# Patient Record
Sex: Female | Born: 1950 | Race: Black or African American | Hispanic: No | Smoking: Never smoker
Health system: Southern US, Community
[De-identification: ages and names within clinical notes are randomized; demographics above are authoritative.]

## PROBLEM LIST (undated history)

## (undated) DIAGNOSIS — Z95810 Presence of automatic (implantable) cardiac defibrillator: Secondary | ICD-10-CM

## (undated) DIAGNOSIS — D259 Leiomyoma of uterus, unspecified: Secondary | ICD-10-CM

## (undated) DIAGNOSIS — B009 Herpesviral infection, unspecified: Secondary | ICD-10-CM

## (undated) DIAGNOSIS — C801 Malignant (primary) neoplasm, unspecified: Secondary | ICD-10-CM

## (undated) DIAGNOSIS — Z7901 Long term (current) use of anticoagulants: Secondary | ICD-10-CM

## (undated) DIAGNOSIS — C50919 Malignant neoplasm of unspecified site of unspecified female breast: Secondary | ICD-10-CM

## (undated) DIAGNOSIS — D219 Benign neoplasm of connective and other soft tissue, unspecified: Secondary | ICD-10-CM

## (undated) DIAGNOSIS — I5032 Chronic diastolic (congestive) heart failure: Secondary | ICD-10-CM

## (undated) DIAGNOSIS — I428 Other cardiomyopathies: Secondary | ICD-10-CM

## (undated) DIAGNOSIS — I639 Cerebral infarction, unspecified: Secondary | ICD-10-CM

## (undated) DIAGNOSIS — Z1379 Encounter for other screening for genetic and chromosomal anomalies: Secondary | ICD-10-CM

## (undated) DIAGNOSIS — N870 Mild cervical dysplasia: Secondary | ICD-10-CM

## (undated) DIAGNOSIS — I4891 Unspecified atrial fibrillation: Secondary | ICD-10-CM

## (undated) DIAGNOSIS — I10 Essential (primary) hypertension: Secondary | ICD-10-CM

## (undated) DIAGNOSIS — I509 Heart failure, unspecified: Secondary | ICD-10-CM

## (undated) HISTORY — PX: CERVICAL CONE BIOPSY: SUR198

## (undated) HISTORY — PX: OOPHORECTOMY: SHX86

## (undated) HISTORY — DX: Chronic diastolic (congestive) heart failure: I50.32

## (undated) HISTORY — DX: Benign neoplasm of connective and other soft tissue, unspecified: D21.9

## (undated) HISTORY — PX: BREAST SURGERY: SHX581

## (undated) HISTORY — DX: Presence of automatic (implantable) cardiac defibrillator: Z95.810

## (undated) HISTORY — DX: Long term (current) use of anticoagulants: Z79.01

## (undated) HISTORY — DX: Malignant neoplasm of unspecified site of unspecified female breast: C50.919

## (undated) HISTORY — PX: COLPOSCOPY: SHX161

## (undated) HISTORY — DX: Herpesviral infection, unspecified: B00.9

## (undated) HISTORY — PX: TUBAL LIGATION: SHX77

## (undated) HISTORY — DX: Leiomyoma of uterus, unspecified: D25.9

## (undated) HISTORY — DX: Encounter for other screening for genetic and chromosomal anomalies: Z13.79

## (undated) HISTORY — DX: Essential (primary) hypertension: I10

## (undated) HISTORY — DX: Mild cervical dysplasia: N87.0

## (undated) HISTORY — DX: Malignant (primary) neoplasm, unspecified: C80.1

## (undated) HISTORY — DX: Cerebral infarction, unspecified: I63.9

## (undated) HISTORY — DX: Unspecified atrial fibrillation: I48.91

## (undated) HISTORY — DX: Heart failure, unspecified: I50.9

## (undated) HISTORY — PX: GYNECOLOGIC CRYOSURGERY: SHX857

## (undated) HISTORY — PX: MASTECTOMY: SHX3

## (undated) HISTORY — PX: CARDIAC DEFIBRILLATOR PLACEMENT: SHX171

## (undated) HISTORY — DX: Other cardiomyopathies: I42.8

---

## 1998-01-29 ENCOUNTER — Encounter: Admission: RE | Admit: 1998-01-29 | Discharge: 1998-04-29 | Payer: Self-pay | Admitting: Radiation Oncology

## 1998-08-11 ENCOUNTER — Observation Stay (HOSPITAL_COMMUNITY): Admission: RE | Admit: 1998-08-11 | Discharge: 1998-08-12 | Payer: Self-pay | Admitting: Obstetrics and Gynecology

## 1998-11-04 ENCOUNTER — Inpatient Hospital Stay (HOSPITAL_COMMUNITY): Admission: RE | Admit: 1998-11-04 | Discharge: 1998-11-08 | Payer: Self-pay | Admitting: Surgery

## 1998-11-04 ENCOUNTER — Encounter: Payer: Self-pay | Admitting: Surgery

## 1999-09-01 ENCOUNTER — Other Ambulatory Visit: Admission: RE | Admit: 1999-09-01 | Discharge: 1999-09-01 | Payer: Self-pay | Admitting: Obstetrics and Gynecology

## 1999-12-22 ENCOUNTER — Ambulatory Visit (HOSPITAL_BASED_OUTPATIENT_CLINIC_OR_DEPARTMENT_OTHER): Admission: RE | Admit: 1999-12-22 | Discharge: 1999-12-22 | Payer: Self-pay | Admitting: Plastic Surgery

## 2000-02-09 ENCOUNTER — Ambulatory Visit (HOSPITAL_BASED_OUTPATIENT_CLINIC_OR_DEPARTMENT_OTHER): Admission: RE | Admit: 2000-02-09 | Discharge: 2000-02-09 | Payer: Self-pay | Admitting: Plastic Surgery

## 2000-03-08 ENCOUNTER — Encounter: Admission: RE | Admit: 2000-03-08 | Discharge: 2000-03-08 | Payer: Self-pay | Admitting: Oncology

## 2000-03-08 ENCOUNTER — Encounter: Payer: Self-pay | Admitting: Oncology

## 2000-09-04 ENCOUNTER — Encounter: Admission: RE | Admit: 2000-09-04 | Discharge: 2000-09-04 | Payer: Self-pay | Admitting: Oncology

## 2000-09-04 ENCOUNTER — Encounter: Payer: Self-pay | Admitting: Oncology

## 2000-10-24 ENCOUNTER — Other Ambulatory Visit: Admission: RE | Admit: 2000-10-24 | Discharge: 2000-10-24 | Payer: Self-pay | Admitting: Obstetrics and Gynecology

## 2001-02-28 ENCOUNTER — Encounter: Admission: RE | Admit: 2001-02-28 | Discharge: 2001-02-28 | Payer: Self-pay | Admitting: Oncology

## 2001-02-28 ENCOUNTER — Encounter: Payer: Self-pay | Admitting: Oncology

## 2001-09-05 ENCOUNTER — Encounter: Admission: RE | Admit: 2001-09-05 | Discharge: 2001-09-05 | Payer: Self-pay | Admitting: Oncology

## 2001-09-05 ENCOUNTER — Encounter: Payer: Self-pay | Admitting: Oncology

## 2001-12-10 ENCOUNTER — Other Ambulatory Visit: Admission: RE | Admit: 2001-12-10 | Discharge: 2001-12-10 | Payer: Self-pay | Admitting: Obstetrics and Gynecology

## 2001-12-27 ENCOUNTER — Encounter: Payer: Self-pay | Admitting: Oncology

## 2001-12-27 ENCOUNTER — Encounter: Admission: RE | Admit: 2001-12-27 | Discharge: 2001-12-27 | Payer: Self-pay | Admitting: Oncology

## 2002-03-06 ENCOUNTER — Encounter: Admission: RE | Admit: 2002-03-06 | Discharge: 2002-03-06 | Payer: Self-pay | Admitting: Oncology

## 2002-03-06 ENCOUNTER — Encounter: Payer: Self-pay | Admitting: Oncology

## 2002-12-10 ENCOUNTER — Other Ambulatory Visit: Admission: RE | Admit: 2002-12-10 | Discharge: 2002-12-10 | Payer: Self-pay | Admitting: Obstetrics and Gynecology

## 2003-03-04 ENCOUNTER — Encounter: Admission: RE | Admit: 2003-03-04 | Discharge: 2003-03-04 | Payer: Self-pay | Admitting: Oncology

## 2003-03-04 ENCOUNTER — Encounter: Payer: Self-pay | Admitting: Oncology

## 2003-07-10 ENCOUNTER — Ambulatory Visit (HOSPITAL_COMMUNITY): Admission: RE | Admit: 2003-07-10 | Discharge: 2003-07-10 | Payer: Self-pay | Admitting: Gastroenterology

## 2004-02-03 ENCOUNTER — Other Ambulatory Visit: Admission: RE | Admit: 2004-02-03 | Discharge: 2004-02-03 | Payer: Self-pay | Admitting: Obstetrics and Gynecology

## 2004-04-13 ENCOUNTER — Ambulatory Visit (HOSPITAL_COMMUNITY): Admission: RE | Admit: 2004-04-13 | Discharge: 2004-04-13 | Payer: Self-pay | Admitting: Oncology

## 2004-04-27 ENCOUNTER — Other Ambulatory Visit: Admission: RE | Admit: 2004-04-27 | Discharge: 2004-04-27 | Payer: Self-pay | Admitting: Obstetrics and Gynecology

## 2005-02-15 ENCOUNTER — Other Ambulatory Visit: Admission: RE | Admit: 2005-02-15 | Discharge: 2005-02-15 | Payer: Self-pay | Admitting: Obstetrics and Gynecology

## 2005-04-12 ENCOUNTER — Ambulatory Visit (HOSPITAL_COMMUNITY): Admission: RE | Admit: 2005-04-12 | Discharge: 2005-04-12 | Payer: Self-pay | Admitting: Oncology

## 2005-04-17 ENCOUNTER — Ambulatory Visit: Payer: Self-pay | Admitting: Oncology

## 2006-02-16 ENCOUNTER — Other Ambulatory Visit: Admission: RE | Admit: 2006-02-16 | Discharge: 2006-02-16 | Payer: Self-pay | Admitting: Obstetrics and Gynecology

## 2006-04-04 ENCOUNTER — Ambulatory Visit: Payer: Self-pay | Admitting: Oncology

## 2006-04-09 LAB — COMPREHENSIVE METABOLIC PANEL
AST: 19 U/L (ref 0–37)
Alkaline Phosphatase: 48 U/L (ref 39–117)
BUN: 16 mg/dL (ref 6–23)
Creatinine, Ser: 0.94 mg/dL (ref 0.40–1.20)

## 2006-04-09 LAB — CBC WITH DIFFERENTIAL/PLATELET
Basophils Absolute: 0 10*3/uL (ref 0.0–0.1)
EOS%: 3.4 % (ref 0.0–7.0)
HCT: 38.4 % (ref 34.8–46.6)
HGB: 12.9 g/dL (ref 11.6–15.9)
MCH: 27 pg (ref 26.0–34.0)
MCV: 80 fL — ABNORMAL LOW (ref 81.0–101.0)
MONO%: 10.8 % (ref 0.0–13.0)
NEUT%: 35.5 % — ABNORMAL LOW (ref 39.6–76.8)

## 2006-04-10 ENCOUNTER — Ambulatory Visit (HOSPITAL_COMMUNITY): Admission: RE | Admit: 2006-04-10 | Discharge: 2006-04-10 | Payer: Self-pay | Admitting: Oncology

## 2006-07-29 ENCOUNTER — Emergency Department (HOSPITAL_COMMUNITY): Admission: EM | Admit: 2006-07-29 | Discharge: 2006-07-29 | Payer: Self-pay | Admitting: *Deleted

## 2006-08-14 ENCOUNTER — Encounter (INDEPENDENT_AMBULATORY_CARE_PROVIDER_SITE_OTHER): Payer: Self-pay | Admitting: Cardiology

## 2006-08-14 ENCOUNTER — Encounter (INDEPENDENT_AMBULATORY_CARE_PROVIDER_SITE_OTHER): Payer: Self-pay | Admitting: Neurology

## 2006-08-14 ENCOUNTER — Inpatient Hospital Stay (HOSPITAL_COMMUNITY): Admission: EM | Admit: 2006-08-14 | Discharge: 2006-08-16 | Payer: Self-pay | Admitting: Emergency Medicine

## 2006-08-14 HISTORY — PX: TRANSTHORACIC ECHOCARDIOGRAM: SHX275

## 2006-08-16 ENCOUNTER — Ambulatory Visit: Payer: Self-pay | Admitting: Physical Medicine & Rehabilitation

## 2006-08-16 ENCOUNTER — Inpatient Hospital Stay (HOSPITAL_COMMUNITY)
Admission: RE | Admit: 2006-08-16 | Discharge: 2006-08-23 | Payer: Self-pay | Admitting: Physical Medicine & Rehabilitation

## 2006-08-16 ENCOUNTER — Ambulatory Visit: Payer: Self-pay | Admitting: Oncology

## 2006-08-16 ENCOUNTER — Ambulatory Visit: Payer: Self-pay | Admitting: Cardiovascular Disease

## 2006-08-19 ENCOUNTER — Ambulatory Visit
Admission: RE | Admit: 2006-08-19 | Discharge: 2006-08-19 | Payer: Self-pay | Admitting: Physical Medicine & Rehabilitation

## 2006-08-28 ENCOUNTER — Encounter
Admission: RE | Admit: 2006-08-28 | Discharge: 2006-11-14 | Payer: Self-pay | Admitting: Physical Medicine & Rehabilitation

## 2006-09-14 ENCOUNTER — Encounter
Admission: RE | Admit: 2006-09-14 | Discharge: 2006-12-13 | Payer: Self-pay | Admitting: Physical Medicine & Rehabilitation

## 2006-09-14 ENCOUNTER — Ambulatory Visit: Payer: Self-pay | Admitting: Physical Medicine & Rehabilitation

## 2006-10-15 ENCOUNTER — Ambulatory Visit: Payer: Self-pay | Admitting: Physical Medicine & Rehabilitation

## 2006-12-24 ENCOUNTER — Ambulatory Visit: Payer: Self-pay | Admitting: Internal Medicine

## 2006-12-24 LAB — CONVERTED CEMR LAB
Basophils Relative: 0.9 % (ref 0.0–1.0)
GFR calc Af Amer: 96 mL/min
GFR calc non Af Amer: 80 mL/min
INR: 2.5 — ABNORMAL HIGH (ref 0.9–2.0)
MCHC: 33.5 g/dL (ref 30.0–36.0)
MCV: 81.5 fL (ref 78.0–100.0)
Monocytes Absolute: 0.5 10*3/uL (ref 0.2–0.7)
Monocytes Relative: 10.2 % (ref 3.0–11.0)
Neutro Abs: 1.9 10*3/uL (ref 1.4–7.7)
Neutrophils Relative %: 43.1 % (ref 43.0–77.0)
Prothrombin Time: 20.3 s — ABNORMAL HIGH (ref 10.0–14.0)
Sodium: 140 meq/L (ref 135–145)
WBC: 4.5 10*3/uL (ref 4.5–10.5)

## 2007-01-04 ENCOUNTER — Observation Stay (HOSPITAL_COMMUNITY): Admission: RE | Admit: 2007-01-04 | Discharge: 2007-01-05 | Payer: Self-pay | Admitting: Internal Medicine

## 2007-01-04 ENCOUNTER — Ambulatory Visit: Payer: Self-pay | Admitting: Internal Medicine

## 2007-01-09 ENCOUNTER — Ambulatory Visit: Payer: Self-pay | Admitting: Oncology

## 2007-01-14 LAB — CBC WITH DIFFERENTIAL/PLATELET
Basophils Absolute: 0 10*3/uL (ref 0.0–0.1)
Eosinophils Absolute: 0.1 10*3/uL (ref 0.0–0.5)
HGB: 12.4 g/dL (ref 11.6–15.9)
MCV: 79.7 fL — ABNORMAL LOW (ref 81.0–101.0)
MONO%: 8.7 % (ref 0.0–13.0)
NEUT#: 6.6 10*3/uL — ABNORMAL HIGH (ref 1.5–6.5)
Platelets: 287 10*3/uL (ref 145–400)
RDW: 15.6 % — ABNORMAL HIGH (ref 11.3–14.5)

## 2007-01-14 LAB — COMPREHENSIVE METABOLIC PANEL
Albumin: 4.1 g/dL (ref 3.5–5.2)
Alkaline Phosphatase: 94 U/L (ref 39–117)
BUN: 18 mg/dL (ref 6–23)
CO2: 23 mEq/L (ref 19–32)
Calcium: 9.8 mg/dL (ref 8.4–10.5)
Glucose, Bld: 103 mg/dL — ABNORMAL HIGH (ref 70–99)
Potassium: 4.4 mEq/L (ref 3.5–5.3)

## 2007-01-14 LAB — CANCER ANTIGEN 27.29: CA 27.29: 16 U/mL (ref 0–39)

## 2007-01-30 ENCOUNTER — Ambulatory Visit: Payer: Self-pay

## 2007-03-13 ENCOUNTER — Other Ambulatory Visit: Admission: RE | Admit: 2007-03-13 | Discharge: 2007-03-13 | Payer: Self-pay | Admitting: Family Medicine

## 2007-04-08 ENCOUNTER — Ambulatory Visit: Payer: Self-pay | Admitting: Internal Medicine

## 2007-04-15 ENCOUNTER — Ambulatory Visit: Payer: Self-pay | Admitting: Internal Medicine

## 2007-06-06 ENCOUNTER — Ambulatory Visit: Payer: Self-pay | Admitting: Internal Medicine

## 2007-06-07 ENCOUNTER — Ambulatory Visit (HOSPITAL_COMMUNITY): Admission: RE | Admit: 2007-06-07 | Discharge: 2007-06-07 | Payer: Self-pay | Admitting: Oncology

## 2007-07-04 ENCOUNTER — Ambulatory Visit: Payer: Self-pay

## 2007-08-09 ENCOUNTER — Observation Stay (HOSPITAL_COMMUNITY): Admission: EM | Admit: 2007-08-09 | Discharge: 2007-08-10 | Payer: Self-pay | Admitting: Emergency Medicine

## 2007-10-01 ENCOUNTER — Ambulatory Visit: Payer: Self-pay | Admitting: Internal Medicine

## 2008-01-02 ENCOUNTER — Ambulatory Visit: Payer: Self-pay | Admitting: Oncology

## 2008-01-06 ENCOUNTER — Ambulatory Visit: Payer: Self-pay | Admitting: Internal Medicine

## 2008-01-06 LAB — CBC WITH DIFFERENTIAL/PLATELET
BASO%: 0.8 % (ref 0.0–2.0)
EOS%: 2.6 % (ref 0.0–7.0)
HCT: 38.1 % (ref 34.8–46.6)
MCH: 26.6 pg (ref 26.0–34.0)
MCHC: 34.5 g/dL (ref 32.0–36.0)
NEUT%: 33.4 % — ABNORMAL LOW (ref 39.6–76.8)
RBC: 4.94 10*6/uL (ref 3.70–5.32)
lymph#: 2.4 10*3/uL (ref 0.9–3.3)

## 2008-01-06 LAB — COMPREHENSIVE METABOLIC PANEL
ALT: 12 U/L (ref 0–35)
AST: 21 U/L (ref 0–37)
Calcium: 9.6 mg/dL (ref 8.4–10.5)
Chloride: 102 mEq/L (ref 96–112)
Creatinine, Ser: 0.91 mg/dL (ref 0.40–1.20)
Total Bilirubin: 0.6 mg/dL (ref 0.3–1.2)

## 2008-01-15 ENCOUNTER — Ambulatory Visit (HOSPITAL_COMMUNITY): Admission: RE | Admit: 2008-01-15 | Discharge: 2008-01-15 | Payer: Self-pay | Admitting: Oncology

## 2008-04-03 ENCOUNTER — Other Ambulatory Visit: Admission: RE | Admit: 2008-04-03 | Discharge: 2008-04-03 | Payer: Self-pay | Admitting: Family Medicine

## 2008-04-06 ENCOUNTER — Ambulatory Visit: Payer: Self-pay | Admitting: Internal Medicine

## 2008-04-30 ENCOUNTER — Other Ambulatory Visit: Admission: RE | Admit: 2008-04-30 | Discharge: 2008-04-30 | Payer: Self-pay | Admitting: Obstetrics and Gynecology

## 2008-06-23 ENCOUNTER — Other Ambulatory Visit: Admission: RE | Admit: 2008-06-23 | Discharge: 2008-06-23 | Payer: Self-pay | Admitting: Obstetrics and Gynecology

## 2008-07-06 ENCOUNTER — Ambulatory Visit: Payer: Self-pay | Admitting: Internal Medicine

## 2008-08-03 ENCOUNTER — Ambulatory Visit: Payer: Self-pay | Admitting: Internal Medicine

## 2008-08-08 ENCOUNTER — Emergency Department (HOSPITAL_COMMUNITY): Admission: EM | Admit: 2008-08-08 | Discharge: 2008-08-08 | Payer: Self-pay | Admitting: Emergency Medicine

## 2008-08-25 ENCOUNTER — Ambulatory Visit: Payer: Self-pay | Admitting: Internal Medicine

## 2008-12-23 ENCOUNTER — Encounter: Payer: Self-pay | Admitting: Internal Medicine

## 2009-01-01 ENCOUNTER — Ambulatory Visit: Payer: Self-pay | Admitting: Oncology

## 2009-01-05 LAB — COMPREHENSIVE METABOLIC PANEL
ALT: 14 U/L (ref 0–35)
Albumin: 4.8 g/dL (ref 3.5–5.2)
CO2: 24 mEq/L (ref 19–32)
Calcium: 9.8 mg/dL (ref 8.4–10.5)
Chloride: 100 mEq/L (ref 96–112)
Potassium: 3.8 mEq/L (ref 3.5–5.3)
Sodium: 139 mEq/L (ref 135–145)
Total Bilirubin: 0.6 mg/dL (ref 0.3–1.2)
Total Protein: 7.5 g/dL (ref 6.0–8.3)

## 2009-01-05 LAB — CBC WITH DIFFERENTIAL/PLATELET
BASO%: 0.4 % (ref 0.0–2.0)
Eosinophils Absolute: 0.2 10*3/uL (ref 0.0–0.5)
MCHC: 33.9 g/dL (ref 31.5–36.0)
MONO#: 0.5 10*3/uL (ref 0.1–0.9)
NEUT#: 3.1 10*3/uL (ref 1.5–6.5)
RBC: 5.3 10*6/uL (ref 3.70–5.45)
WBC: 6.4 10*3/uL (ref 3.9–10.3)
lymph#: 2.5 10*3/uL (ref 0.9–3.3)

## 2009-01-05 LAB — CANCER ANTIGEN 27.29: CA 27.29: 22 U/mL (ref 0–39)

## 2009-03-19 ENCOUNTER — Encounter: Payer: Self-pay | Admitting: Internal Medicine

## 2009-03-24 ENCOUNTER — Ambulatory Visit: Payer: Self-pay | Admitting: Internal Medicine

## 2009-04-13 ENCOUNTER — Other Ambulatory Visit: Admission: RE | Admit: 2009-04-13 | Discharge: 2009-04-13 | Payer: Self-pay | Admitting: Family Medicine

## 2009-04-16 ENCOUNTER — Encounter: Payer: Self-pay | Admitting: Internal Medicine

## 2009-05-21 ENCOUNTER — Other Ambulatory Visit: Admission: RE | Admit: 2009-05-21 | Discharge: 2009-05-21 | Payer: Self-pay | Admitting: Family Medicine

## 2009-06-09 ENCOUNTER — Ambulatory Visit: Payer: Self-pay | Admitting: Obstetrics and Gynecology

## 2009-06-28 ENCOUNTER — Ambulatory Visit: Payer: Self-pay | Admitting: Internal Medicine

## 2009-06-29 ENCOUNTER — Encounter: Payer: Self-pay | Admitting: Internal Medicine

## 2009-07-07 ENCOUNTER — Encounter: Payer: Self-pay | Admitting: Internal Medicine

## 2009-07-12 ENCOUNTER — Ambulatory Visit (HOSPITAL_COMMUNITY): Admission: RE | Admit: 2009-07-12 | Discharge: 2009-07-12 | Payer: Self-pay | Admitting: Obstetrics and Gynecology

## 2009-07-12 ENCOUNTER — Ambulatory Visit: Payer: Self-pay | Admitting: Obstetrics and Gynecology

## 2009-07-12 ENCOUNTER — Encounter: Payer: Self-pay | Admitting: Obstetrics and Gynecology

## 2009-08-09 ENCOUNTER — Ambulatory Visit: Payer: Self-pay | Admitting: Obstetrics and Gynecology

## 2009-08-18 DIAGNOSIS — I509 Heart failure, unspecified: Secondary | ICD-10-CM

## 2009-08-18 DIAGNOSIS — I4891 Unspecified atrial fibrillation: Secondary | ICD-10-CM | POA: Insufficient documentation

## 2009-08-18 DIAGNOSIS — I10 Essential (primary) hypertension: Secondary | ICD-10-CM

## 2009-08-18 HISTORY — DX: Essential (primary) hypertension: I10

## 2009-08-18 HISTORY — DX: Heart failure, unspecified: I50.9

## 2009-08-25 ENCOUNTER — Encounter: Payer: Self-pay | Admitting: Internal Medicine

## 2009-08-25 ENCOUNTER — Ambulatory Visit: Payer: Self-pay | Admitting: Internal Medicine

## 2009-08-25 DIAGNOSIS — Z95810 Presence of automatic (implantable) cardiac defibrillator: Secondary | ICD-10-CM

## 2009-08-25 HISTORY — DX: Presence of automatic (implantable) cardiac defibrillator: Z95.810

## 2009-11-04 ENCOUNTER — Ambulatory Visit (HOSPITAL_COMMUNITY): Admission: RE | Admit: 2009-11-04 | Discharge: 2009-11-04 | Payer: Self-pay | Admitting: Oncology

## 2009-12-15 ENCOUNTER — Ambulatory Visit: Payer: Self-pay | Admitting: Internal Medicine

## 2009-12-24 ENCOUNTER — Encounter: Payer: Self-pay | Admitting: Internal Medicine

## 2009-12-31 ENCOUNTER — Ambulatory Visit: Payer: Self-pay | Admitting: Oncology

## 2010-01-04 LAB — CBC WITH DIFFERENTIAL/PLATELET
BASO%: 0.4 % (ref 0.0–2.0)
Basophils Absolute: 0 10*3/uL (ref 0.0–0.1)
EOS%: 5.5 % (ref 0.0–7.0)
Eosinophils Absolute: 0.3 10*3/uL (ref 0.0–0.5)
HCT: 38.6 % (ref 34.8–46.6)
HGB: 13.2 g/dL (ref 11.6–15.9)
MCH: 26.5 pg (ref 25.1–34.0)
MONO#: 0.6 10*3/uL (ref 0.1–0.9)
NEUT%: 38.6 % (ref 38.4–76.8)
Platelets: 198 10*3/uL (ref 145–400)
RBC: 4.99 10*6/uL (ref 3.70–5.45)
RDW: 15 % — ABNORMAL HIGH (ref 11.2–14.5)
WBC: 4.9 10*3/uL (ref 3.9–10.3)

## 2010-01-04 LAB — COMPREHENSIVE METABOLIC PANEL
Albumin: 4.4 g/dL (ref 3.5–5.2)
Alkaline Phosphatase: 57 U/L (ref 39–117)
Potassium: 4.1 mEq/L (ref 3.5–5.3)
Total Bilirubin: 0.5 mg/dL (ref 0.3–1.2)

## 2010-01-04 LAB — CANCER ANTIGEN 27.29: CA 27.29: 20 U/mL (ref 0–39)

## 2010-01-27 ENCOUNTER — Other Ambulatory Visit: Admission: RE | Admit: 2010-01-27 | Discharge: 2010-01-27 | Payer: Self-pay | Admitting: Obstetrics and Gynecology

## 2010-01-27 ENCOUNTER — Ambulatory Visit: Payer: Self-pay | Admitting: Obstetrics and Gynecology

## 2010-03-21 ENCOUNTER — Ambulatory Visit: Payer: Self-pay | Admitting: Internal Medicine

## 2010-04-02 ENCOUNTER — Emergency Department (HOSPITAL_COMMUNITY)
Admission: EM | Admit: 2010-04-02 | Discharge: 2010-04-02 | Payer: Self-pay | Source: Home / Self Care | Admitting: Emergency Medicine

## 2010-04-04 ENCOUNTER — Encounter: Payer: Self-pay | Admitting: Internal Medicine

## 2010-06-24 ENCOUNTER — Encounter: Payer: Self-pay | Admitting: Internal Medicine

## 2010-06-27 ENCOUNTER — Ambulatory Visit: Payer: Self-pay | Admitting: Internal Medicine

## 2010-07-18 ENCOUNTER — Encounter: Payer: Self-pay | Admitting: Internal Medicine

## 2010-08-17 ENCOUNTER — Other Ambulatory Visit: Admission: RE | Admit: 2010-08-17 | Discharge: 2010-08-17 | Payer: Self-pay | Admitting: Obstetrics and Gynecology

## 2010-08-17 ENCOUNTER — Ambulatory Visit: Payer: Self-pay | Admitting: Obstetrics and Gynecology

## 2010-08-22 ENCOUNTER — Ambulatory Visit: Payer: Self-pay | Admitting: Obstetrics and Gynecology

## 2010-10-04 ENCOUNTER — Ambulatory Visit: Payer: Self-pay | Admitting: Internal Medicine

## 2010-11-19 ENCOUNTER — Encounter: Payer: Self-pay | Admitting: Physical Medicine & Rehabilitation

## 2010-11-19 ENCOUNTER — Encounter: Payer: Self-pay | Admitting: *Deleted

## 2010-11-20 ENCOUNTER — Encounter: Payer: Self-pay | Admitting: Oncology

## 2010-11-29 NOTE — Letter (Signed)
Summary: Remote Device Check  [employer], Main Office  [address]   [place], [place] [address]   Phone: [phone]  Fax: [phone]     December 24, 2009 MRN: [id number]   [facility] [place] [address] [place], [place]  [address]   Dear Ms. Cohick,   Your remote transmission was recieved and reviewed by your physician.  All diagnostics were within normal limits for you.  __X___Your next transmission is scheduled for:  Mar 21, 2010.  Please transmit at any time this day.  If you have a wireless device your transmission will be sent automatically.      Sincerely,  Proofreader

## 2010-11-29 NOTE — Cardiovascular Report (Signed)
Summary: Office Visit Remote   Office Visit Remote   Imported By: Roderic Ovens 04/14/2010 16:13:17  _____________________________________________________________________  External Attachment:    Type:   Image     Comment:   External Document

## 2010-11-29 NOTE — Letter (Signed)
Summary: Remote Device Check  [employer], Main Office  [address]   [place], [place] [address]   Phone: [phone]  Fax: [phone]     July 18, 2010 MRN: [id number]   [facility] [address] [place], [place]  [address]   Dear Ms. Kinnaird,   Your remote transmission was recieved and reviewed by your physician.  All diagnostics were within normal limits for you.  __X____Your next office visit is scheduled for:  December with Dr Ladona Ridgel. Please call our office to schedule an appointment.    Sincerely,  Vella Kohler

## 2010-11-29 NOTE — Letter (Signed)
Summary: Device-Delinquent Phone Journal[employer], newspaper, Main Office  [address]   [place], [place] [address]   Phone: [phone]  Fax: [phone]     June 24, 2010 MRN: [id number]   [facility] [address] [place], [place]  [address]   Dear Danielle Mckinney,  According to our records, you were scheduled for a device phone transmission on 06-23-2010.     We did not receive any results from this check.  If you transmitted on your scheduled day, please call us to help troubleshoot your system.  If you forgot to send your transmission, please send one upon receipt of this letter.  Thank you,   Architectural technologist [facility]

## 2010-11-29 NOTE — Cardiovascular Report (Signed)
Summary: Office Visit Remote   Office Visit Remote   Imported By: Roderic Ovens 07/19/2010 13:47:20  _____________________________________________________________________  External Attachment:    Type:   Image     Comment:   External Document

## 2010-11-29 NOTE — Letter (Signed)
Summary: Remote Device Check  [employer], Main Office  [address]   [place], [place] [address]   Phone: [phone]  Fax: [phone]     April 04, 2010 MRN: [id number]   [facility] [place] [address] [place], [place]  [address]   Dear Ms. Riss,   Your remote transmission was recieved and reviewed by your physician.  All diagnostics were within normal limits for you.  __X___Your next transmission is scheduled for:  06-23-2010.  Please transmit at any time this day.  If you have a wireless device your transmission will be sent automatically.  Sincerely,  Vella Kohler

## 2010-11-29 NOTE — Cardiovascular Report (Signed)
Summary: Office Visit Remote   Office Visit Remote   Imported By: Roderic Ovens 12/27/2009 11:14:40  _____________________________________________________________________  External Attachment:    Type:   Image     Comment:   External Document

## 2010-11-29 NOTE — Assessment & Plan Note (Signed)
Summary: sjm icd   Visit Type:  Follow-up Primary Provider:  Dr.K. Westermann   History of Present Illness: [name] returns today for followupl.  She is a pleasant middle aged woman with a h/o DCM and CHF, s/p BiV ICD.  She also has a h/o stroke and PAF and is maintained on coumadin.  Her CHF symptoms are well controlled class 2.  No peripheral edema.  No c/p or sob except when walking up hills or stairs.She denies any intercurrent ICD therapies.   She does note that she has had some problems with cough and this has been keeping her awake at night.  Cough is non-productive.  Current Medications (verified): 1)  Coreg 6.25 Mg Tabs (Carvedilol) .[name] Kitchen.. 1 1/2 Tabs Two Times A Day 2)  Aldactone 25 Mg Tabs (Spironolactone) .... Once Daily 3)  Coumadin 6 Mg Tabs (Warfarin Sodium) .... 6mg  On Sun. and  Wed. and 4 Mgs Others Days 4)  Furosemide 40 Mg Tabs (Furosemide) .... As Needed 5)  Potassium Chloride Crys Cr 20 Meq Cr-Tabs (Potassium Chloride Crys Cr) .... As Needed 6)  Restoril 15 Mg Caps (Temazepam) .... As Needed 7)  Caltrate 600+d 600-400 Mg-Unit Tabs (Calcium Carbonate-Vitamin D) .... 2 Tabs Once Daily 8)  Fish Oil 1000 Mg Caps (Omega-3 Fatty Acids) .... 2 Tabs Once Daily 9)  Lisinopril 10 Mg Tabs (Lisinopril) .... Take One Tablet By Mouth Daily  Allergies: 1)  ! * Pcn 2)  ! * Codiene  Past History:  Past Medical History: Last updated: 08/18/2009 Atrial Fibrillation Cancer Congestive Heart Failure Hypertension pacemaker  Past Surgical History: Last updated: 08/18/2009  mastectomy   oophorectomy   Review of Systems  The patient denies chest pain, syncope, dyspnea on exertion, and peripheral edema.    Vital Signs:  Patient profile:   60 year old female Height:      63 inches Weight:      175 pounds BMI:     31.11 Pulse rate:   87 / minute BP sitting:   108 / 79  (left arm)  Vitals Entered By: Laurance Flatten CMA (October 04, 2010 2:31 PM)  Physical Exam  General:   Well developed, well nourished, in no acute distress.  HEENT: normal Neck: supple. No JVD. Carotids 2+ bilaterally no bruits Cor: RRR no rubs, gallops or murmur Lungs: CTA. Well healed ICD incision. Ab: soft, nontender. nondistended. No HSM. Good bowel sounds Ext: warm. no cyanosis, clubbing or edema Neuro: alert and oriented. Grossly nonfocal. affect pleasant     ICD Specifications Following MD:  Lewayne Bunting, MD     ICD Vendor:  St Jude     ICD Model Number:  [phone]     ICD Serial Number:  [id number] ICD DOI:  01/04/2007     ICD Implanting MD:  Lewayne Bunting, MD  Lead 1:    Location: RA     DOI: 01/04/2007     Model #: [id number]     Serial #: [id number]     Status: active Lead 2:    Location: RV     DOI: 01/04/2007     Model #: 7001     Serial #: [id number]     Status: active Lead 3:    Location: LV     DOI: 01/04/2007     Model #: 1056     Serial #: [id number]     Status: active  Indications::  NICM, CHF   ICD Follow Up Remote Check?  No  Battery Voltage:  2.55 V     Charge Time:  13.1 seconds     Underlying rhythm:  SR ICD Dependent:  No       ICD Device Measurements Atrium:  Amplitude: 3.0 mV, Impedance: 375 ohms, Threshold: 0.5 V at 0.5 msec Right Ventricle:  Amplitude: 12 mV, Impedance: 340 ohms, Threshold: 0.5 V at 0.5 msec Left Ventricle:  Impedance: 650 ohms, Threshold: 0.75 V at 0.5 msec  Episodes MS Episodes:  9     Percent Mode Switch:  <1%     Coumadin:  No Shock:  0     ATP:  0     Nonsustained:  0     Atrial Pacing:  8%     Ventricular Pacing:  93%  Brady Parameters Mode DDD     Lower Rate Limit:  60     Upper Rate Limit 120 PAV 180     Sensed AV Delay:  130  Tachy Zones VF:  240     VT:  200     VT1:  174     Next Remote Date:  01/05/2011     Next Cardiology Appt Due:  09/30/2011 Tech Comments:  No parameter changes.  Atlas device battery @ 2.55 since 10/04/10.  Merlin transmissions every 3 months.   ROV 1 year with Dr. Ladona Ridgel. Altha Harm, LPN  October 04, 2010 2:35  PM  MD Comments:  Agree with above.  Impression & Recommendations:  Problem # 1:  AUTOMATIC IMPLANTABLE CARDIAC DEFIBRILLATOR SITU (ICD-V45.02) Her device is working normally.  Will recheck in several months.  Problem # 2:  CHF (ICD-428.0) Her chronic systolic CHF remains class 2.  She will continue meds as below. Her updated medication list for this problem includes:    Coreg 6.25 Mg Tabs (Carvedilol) .[name] Kitchen... 1 1/2 tabs two times a day    Aldactone 25 Mg Tabs (Spironolactone) ..... Once daily    Coumadin 6 Mg Tabs (Warfarin sodium) ..... 6mg  on sun. and  wed. and 4 mgs others days    Furosemide 40 Mg Tabs (Furosemide) .[name] Kitchen... As needed    Lisinopril 10 Mg Tabs (Lisinopril) .[name] Kitchen... Take one tablet by mouth daily  Problem # 3:  ATRIAL FIBRILLATION (ICD-427.31) She appears to be maintaining NSR. Her updated medication list for this problem includes:    Coreg 6.25 Mg Tabs (Carvedilol) .[name] Kitchen... 1 1/2 tabs two times a day    Coumadin 6 Mg Tabs (Warfarin sodium) ..... 6mg  on sun. and  wed. and 4 mgs others days  Patient Instructions: 1)  Your physician recommends that you continue on your current medications as directed. Please refer to the Current Medication list given to you today. 2)  Your physician wants you to follow-up in: 1 year.   You will receive a reminder letter in the mail two months in advance. If you don't receive a letter, please call our office to schedule the follow-up appointment.

## 2010-12-01 NOTE — Cardiovascular Report (Signed)
Summary: Office Visit   Office Visit   Imported By: Roderic Ovens 10/13/2010 11:54:50  _____________________________________________________________________  External Attachment:    Type:   Image     Comment:   External Document

## 2011-01-05 ENCOUNTER — Encounter: Payer: Self-pay | Admitting: Internal Medicine

## 2011-01-05 ENCOUNTER — Encounter (INDEPENDENT_AMBULATORY_CARE_PROVIDER_SITE_OTHER): Payer: Medicare Other

## 2011-01-05 ENCOUNTER — Other Ambulatory Visit: Payer: Self-pay | Admitting: Oncology

## 2011-01-05 ENCOUNTER — Encounter (HOSPITAL_BASED_OUTPATIENT_CLINIC_OR_DEPARTMENT_OTHER): Payer: Medicare Other | Admitting: Oncology

## 2011-01-05 ENCOUNTER — Ambulatory Visit (HOSPITAL_COMMUNITY)
Admission: RE | Admit: 2011-01-05 | Discharge: 2011-01-05 | Disposition: A | Discharge status: Home / Self Care | Payer: Medicare Other | Source: Ambulatory Visit | Attending: Oncology | Admitting: Oncology

## 2011-01-05 DIAGNOSIS — C50919 Malignant neoplasm of unspecified site of unspecified female breast: Secondary | ICD-10-CM

## 2011-01-05 DIAGNOSIS — J984 Other disorders of lung: Secondary | ICD-10-CM | POA: Insufficient documentation

## 2011-01-05 DIAGNOSIS — R000 Tachycardia, unspecified: Secondary | ICD-10-CM | POA: Insufficient documentation

## 2011-01-05 DIAGNOSIS — I428 Other cardiomyopathies: Secondary | ICD-10-CM

## 2011-01-05 LAB — CBC WITH DIFFERENTIAL/PLATELET
BASO%: 0.8 % (ref 0.0–2.0)
HCT: 38.8 % (ref 34.8–46.6)
MCH: 26.9 pg (ref 25.1–34.0)
NEUT#: 1.8 10*3/uL (ref 1.5–6.5)
NEUT%: 35.1 % — ABNORMAL LOW (ref 38.4–76.8)
RDW: 15.9 % — ABNORMAL HIGH (ref 11.2–14.5)
WBC: 5.2 10*3/uL (ref 3.9–10.3)
lymph#: 2.6 10*3/uL (ref 0.9–3.3)

## 2011-01-05 LAB — COMPREHENSIVE METABOLIC PANEL
AST: 22 U/L (ref 0–37)
Alkaline Phosphatase: 49 U/L (ref 39–117)
Calcium: 9.9 mg/dL (ref 8.4–10.5)
Chloride: 104 mEq/L (ref 96–112)
Creatinine, Ser: 0.87 mg/dL (ref 0.40–1.20)
Glucose, Bld: 100 mg/dL — ABNORMAL HIGH (ref 70–99)
Total Bilirubin: 0.8 mg/dL (ref 0.3–1.2)
Total Protein: 7.3 g/dL (ref 6.0–8.3)

## 2011-01-05 LAB — CANCER ANTIGEN 27.29: CA 27.29: 27 U/mL (ref 0–39)

## 2011-01-09 ENCOUNTER — Other Ambulatory Visit: Payer: Self-pay | Admitting: Oncology

## 2011-01-09 DIAGNOSIS — C50919 Malignant neoplasm of unspecified site of unspecified female breast: Secondary | ICD-10-CM

## 2011-01-12 ENCOUNTER — Encounter: Payer: Medicare Other | Admitting: Oncology

## 2011-01-12 ENCOUNTER — Ambulatory Visit (HOSPITAL_COMMUNITY)
Admission: RE | Admit: 2011-01-12 | Discharge: 2011-01-12 | Disposition: A | Discharge status: Home / Self Care | Payer: Medicare Other | Source: Ambulatory Visit | Attending: Oncology | Admitting: Oncology

## 2011-01-12 ENCOUNTER — Encounter: Payer: Self-pay | Admitting: *Deleted

## 2011-01-12 DIAGNOSIS — J984 Other disorders of lung: Secondary | ICD-10-CM | POA: Insufficient documentation

## 2011-01-12 DIAGNOSIS — Z171 Estrogen receptor negative status [ER-]: Secondary | ICD-10-CM

## 2011-01-12 DIAGNOSIS — C50919 Malignant neoplasm of unspecified site of unspecified female breast: Secondary | ICD-10-CM

## 2011-01-12 DIAGNOSIS — Z853 Personal history of malignant neoplasm of breast: Secondary | ICD-10-CM | POA: Insufficient documentation

## 2011-01-12 MED ORDER — IOHEXOL 300 MG/ML  SOLN
80.0000 mL | Freq: Once | INTRAMUSCULAR | Status: AC | PRN
  Administered 2011-01-12: 80 mL via INTRAVENOUS

## 2011-01-16 LAB — COMPREHENSIVE METABOLIC PANEL
AST: 29 U/L (ref 0–37)
Albumin: 4.3 g/dL (ref 3.5–5.2)
BUN: 12 mg/dL (ref 6–23)
CO2: 25 mEq/L (ref 19–32)
Calcium: 10.1 mg/dL (ref 8.4–10.5)
Chloride: 105 mEq/L (ref 96–112)
GFR calc Af Amer: >60 mL/min (ref >60)
Glucose, Bld: 120 mg/dL — ABNORMAL HIGH (ref 70–99)
Sodium: 137 mEq/L (ref 135–145)

## 2011-01-16 LAB — DIFFERENTIAL
Basophils Absolute: 0 10*3/uL (ref 0.0–0.1)
Eosinophils Relative: 5 % (ref 0–5)
Lymphocytes Relative: 45 % (ref 12–46)
Lymphs Abs: 2.2 10*3/uL (ref 0.7–4.0)
Monocytes Absolute: 0.4 10*3/uL (ref 0.1–1.0)
Neutro Abs: 1.9 10*3/uL (ref 1.7–7.7)

## 2011-01-16 LAB — CBC
HCT: 41.1 % (ref 36.0–46.0)
WBC: 4.8 10*3/uL (ref 4.0–10.5)

## 2011-01-16 LAB — BRAIN NATRIURETIC PEPTIDE: Pro B Natriuretic peptide (BNP): <30 pg/mL (ref 0.0–100.0)

## 2011-01-16 LAB — POCT CARDIAC MARKERS
Myoglobin, poc: 100 ng/mL (ref 12–200)
Troponin i, poc: <0.05 ng/mL (ref 0.00–0.09)

## 2011-01-17 NOTE — Letter (Signed)
Summary: Remote Device Check  [employer], Main Office  [address]   [place], [place] [address]   Phone: [phone]  Fax: [phone]     January 12, 2011 MRN: [id number]   [facility] [address] [place], [place]  [address]   Dear Ms. Longino,   Your remote transmission was recieved and reviewed by your physician.  All diagnostics were within normal limits for you.  ___X__Your next transmission is scheduled for:  04/06/11.   Please transmit at any time this day.  If you have a wireless device your transmission will be sent automatically.  ______Your next office visit is scheduled for:                              . Please call our office to schedule an appointment.    Sincerely,  Altha Harm, LPN

## 2011-01-17 NOTE — Cardiovascular Report (Signed)
Summary: Office Visit   Office Visit   Imported By: Roderic Ovens 01/13/2011 15:01:21  _____________________________________________________________________  External Attachment:    Type:   Image     Comment:   External Document

## 2011-02-03 LAB — CBC
MCHC: 33.3 g/dL (ref 30.0–36.0)
MCV: 80.6 fL (ref 78.0–100.0)
Platelets: 228 10*3/uL (ref 150–400)
WBC: 5 10*3/uL (ref 4.0–10.5)

## 2011-02-03 LAB — BASIC METABOLIC PANEL
BUN: 11 mg/dL (ref 6–23)
CO2: 28 mEq/L (ref 19–32)
Calcium: 9.7 mg/dL (ref 8.4–10.5)
Chloride: 102 mEq/L (ref 96–112)
Creatinine, Ser: 0.82 mg/dL (ref 0.4–1.2)
GFR calc Af Amer: >60 mL/min (ref >60)

## 2011-02-21 ENCOUNTER — Ambulatory Visit: Payer: 59 | Admitting: Obstetrics and Gynecology

## 2011-03-01 ENCOUNTER — Other Ambulatory Visit: Payer: Self-pay | Admitting: Obstetrics and Gynecology

## 2011-03-01 ENCOUNTER — Other Ambulatory Visit (HOSPITAL_COMMUNITY)
Admission: RE | Admit: 2011-03-01 | Discharge: 2011-03-01 | Disposition: A | Discharge status: Home / Self Care | Payer: Medicare Other | Source: Ambulatory Visit | Attending: Obstetrics and Gynecology | Admitting: Obstetrics and Gynecology

## 2011-03-01 ENCOUNTER — Ambulatory Visit (INDEPENDENT_AMBULATORY_CARE_PROVIDER_SITE_OTHER): Payer: Medicare Other | Admitting: Obstetrics and Gynecology

## 2011-03-01 DIAGNOSIS — D069 Carcinoma in situ of cervix, unspecified: Secondary | ICD-10-CM

## 2011-03-01 DIAGNOSIS — Z9189 Other specified personal risk factors, not elsewhere classified: Secondary | ICD-10-CM

## 2011-03-01 DIAGNOSIS — D259 Leiomyoma of uterus, unspecified: Secondary | ICD-10-CM

## 2011-03-01 DIAGNOSIS — N949 Unspecified condition associated with female genital organs and menstrual cycle: Secondary | ICD-10-CM

## 2011-03-14 NOTE — Assessment & Plan Note (Signed)
[facility]                         ELECTROPHYSIOLOGY OFFICE NOTE   NAME:PASSLelar, Mckinney                         MRN:          [id number]  DATE:08/25/2008                            DOB:          05/30/1951    Danielle Mckinney returns today for followup.  She is a very pleasant middle-aged  woman with a nonischemic cardiomyopathy, congestive heart failure, and  left bundle-branch block, who is status post BiV-ICD insertion.  She  returns today for followup.  She has been stable.  Her heart failure is  well controlled and she has not been hospitalized with heart failure.  She has rare palpitations, particularly if she eats chocolate.   MEDICATIONS:  1. Carvedilol 6.25 twice daily.  She takes actually 1.5 tablet twice      daily.  2. Warfarin as directed.  3. Aldactone 25 a day.  4. Lisinopril 10 a day.  5. Lasix 40 a day.  6. Fish oil supplements.  7. She is also on Caltrate.  8. Temazepam p.r.n.  9. Potassium supplements.   PHYSICAL EXAMINATION:  GENERAL:  She is a pleasant, well-appearing,  middle-aged woman in no distress.  VITAL SIGNS:  Blood pressure was 130/79, the pulse was 88 and regular,  respirations were 18, weight was 174 pounds.  NECK:  No jugular venous distention.  LUNGS:  Clear bilaterally to auscultation.  No wheezes, rales, or  rhonchi.  CARDIOVASCULAR:  Regular rate and rhythm.  Normal S1 and S2.  ABDOMINAL:  Soft, nontender.  EXTREMITIES:  No cyanosis, clubbing, or edema.   Interrogation of her defibrillator demonstrates a Social research officer, government V-365.  P-waves are greater than 3, R-waves are greater than 12, the impedance  was 400 in the A and 375 in the RV, and the threshold is 0.5 at 0.5 in  the A and 0.75 at 0.5 in the RV.  Battery voltage was 3.05 volts.  Her  LV threshold was 1 volt at 0.8.  There were no intercurrent IC  therapies.  She had three mode switches less than 1% of the time.   IMPRESSION:  1. Nonischemic  cardiomyopathy.  2. Congestive heart failure.  3. Left bundle-branch block.  4. Status post biventricular implantable cardioverter-defibrillator      insertion.   DISCUSSION:  Overall, Danielle Mckinney is stable, her defibrillator is working  normally, and her heart failure is well controlled.  She has had no  significant atrial arrhythmias.  We will plan to see the patient back in  the office in 1 year.     Danielle Mckinney. Ladona Ridgel, MD  Electronically Signed    GWT/MedQ  DD: 08/25/2008  DT: 08/26/2008  Job #: [id number]   cc:   Danielle Mckinney, M.D.

## 2011-03-14 NOTE — Assessment & Plan Note (Signed)
[facility]                         ELECTROPHYSIOLOGY OFFICE NOTE   NAME:PASSToshia, Larkin                         MRN:          [id number]  DATE:04/08/2007                            DOB:          1951-05-04    HISTORY:  Danielle Mckinney returns today for followup.  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  She has a history of atrial fibrillation  and had a stroke from this and a history of severe LV dysfunction status  post biventricular ICD insertion secondary to all of the above with  congestive heart failure and EF of 25%.  She returns today for followup.  She continues to have congestive heart failure symptoms but has  improved.  She is requesting that she be able to go back to exercise and  walk on her exercise bike and use the treadmill.  The patient also asked  about possibly going back and lifting weights at this point and to get  in the pool.   PHYSICAL EXAMINATION:  GENERAL:  On exam today she is a pleasant middle-  aged woman in no acute distress.  VITAL SIGNS:  The blood pressure today was 138/90, the pulse was 86 and  regular, the respirations were 18, the weight was 174 pounds.  NECK:  The neck revealed no jugular venous distention.  RESPIRATORY:  Lungs were clear bilaterally to auscultation.  No wheezes,  rales, or rhonchi.  CARDIOVASCULAR:  Exam revealed a regular rate and rhythm with normal S1  and S2.  EXTREMITIES:  Demonstrated no cyanosis, clubbing, or edema.   MEDICATIONS:  Medications include carvedilol 6 twice a day, warfarin,  aldactone 25 a day, lisinopril/hydrochlorothiazide 10/12.5 a day,  potassium supplementation and Restoril daily.   INTERROGATION:  Interrogation of her defibrillator demonstrates a St.  Physiological scientist.  The P waves were 3, the R waves were greater than 12,  the impedance 440 and the atrium 335 and the right ventricle 500 and  the  left ventricle the threshold was 0.5 in both the RA and the RV, the LV  threshold 0.75 at 0.5 unipolar, 1.3 bipolar, battery voltage 3.2 volts.  There were no intercurrent ICD  therapies.  Her device was reprogrammed  today from unipolar to bipolar to minimize diaphragmatic stimulation.  We also adjusted her PMT to minimize pacemaker-induced tachycardia.   IMPRESSION:  1. Nonischemic cardiomyopathy.  2. Congestive heart failure.  3. Status post implantable cardioverter/defibrillator insertion.   DISCUSSION:  Overall Ms. Offield is stable.  We will plan to see her back  in the office in 9 months.     Doylene Canning. Ladona Ridgel, MD  Electronically Signed    GWT/MedQ  DD: 04/08/2007  DT: 04/08/2007  Job #: [id number]   cc:   Thereasa Solo. Little, M.D.

## 2011-03-14 NOTE — Assessment & Plan Note (Signed)
[facility]                         ELECTROPHYSIOLOGY OFFICE NOTE   NAME:Danielle Mckinney, Danielle Mckinney                         MRN:          [id number]  DATE:06/06/2007                            DOB:          1951-01-11    Ms. Guzek returns today for followup.  She is a very pleasant middle-aged  woman with a history of metastatic breast cancer and Adriamycin-induced  cardiomyopathy and left bundle branch block who underwent ICD  implantation with a BiV device back in March.  She has done well since  then but notes for the last several days that she has had left arm  swelling.  She states that she has not come off of her Coumadin and her  INR has been therapeutic.  She denies chest pain.  She denies shortness  of breath, although she does have chronic dyspnea, she is still able to  exercise on a regular basis.  She does do upper body exercising and  lifts weights of up to 40 pounds.   PHYSICAL EXAMINATION:  GENERAL:  She is a pleasant, well-appearing,  middle-aged woman in no acute distress.  VITAL SIGNS:  The blood pressure is 122/80.  The pulse is 85 and  regular.  The respirations were 18.  Weight was 176 pounds.  NECK:  Revealed no jugular venous distention.  LUNGS:  Clear bilaterally to auscultation.  No wheezes, rales, or  rhonchi.  CARDIOVASCULAR:  Revealed a regular rate and rhythm with normal S1 and  S2.  EXTREMITIES:  The left arm is swollen slightly and there is some  swelling over the left clavicular region as well.  The pacemaker  defibrillator pocket itself has healed nicely.  There is no evidence of  any fluctuance or erythema or effusion around the pocket.   MEDICATIONS:  1. Coreg 6.25 twice daily.  2. Warfarin as directed.  3. Aldactone 25 a day.  4. Lisinopril/HCTZ 10/12.5.  5. Potassium 20 a day.  6. Temazepam p.r.n.   Interrogation of her defibrillator demonstrates a St. Jude BiV device (V-  365).  The P waves were 3.  The R waves greater  than 12.  The impedance  440 in the atrium, 340 in the right ventricle, 880 in the left  ventricle.  The threshold was 0.5 at 0.5 both in the right atrium, the  right ventricle, and the left ventricle.  Battery voltage was 3.2 volts.   IMPRESSION:  1. Left arm swelling, 6 months out from implantable cardioverter-      defibrillator implantation.  2. History of Adriamycin-induced cardiomyopathy.  3. Status post implantable cardioverter-defibrillator insertion as      previously mentioned.   DISCUSSION:  Ms. Budge is stable.  Her left arm swelling may be a late  DVT despite being on Coumadin.  There could be some lymphedema that has  developed.  She could certainly have an extrinsic compression of her  venous system by a tumor.  The patient fortuitously has been scheduled  for a CT scan of the chest by Dr. Darnelle Catalan.  We will await the results  of this.  In the meantime, I have asked that she continue her Coumadin  as she presently is and that she keep her left arm elevated.  We will  plan to see her back in the office in several months.     Doylene Canning. Ladona Ridgel, MD  Electronically Signed    GWT/MedQ  DD: 06/06/2007  DT: 06/06/2007  Job #: [id number]   cc:   Valentino Hue. Magrinat, M.D.  Thereasa Solo. Little, M.D.

## 2011-03-14 NOTE — Discharge Summary (Signed)
NAME:  CASI, WESTERFELD NO.:  [id number]   MEDICAL RECORD NO.:  [id number]          PATIENT TYPE:  INP   LOCATION:  2010                         FACILITY:  [facility]   PHYSICIAN:  Thereasa Solo. Little, M.D. DATE OF BIRTH:  06/04/1951   DATE OF ADMISSION:  08/09/2007  DATE OF DISCHARGE:  08/10/2007                               DISCHARGE SUMMARY   DISCHARGE DIAGNOSES:  1. Shortness of breath, resolved.  2. History of cerebral vascular accident with right-sided weakness.  3. History of nonischemic cardiomyopathy, ejection fraction 15 to 20%.    Dictation ended at this point.      Darcella Gasman. Ingold, N.P.    ______________________________  Thereasa Solo Little, M.D.    LRI/MEDQ  D:  08/10/2007  T:  08/11/2007  Job:  [id number]   cc:   Thereasa Solo. Little, M.D.

## 2011-03-14 NOTE — Assessment & Plan Note (Signed)
[facility]                         ELECTROPHYSIOLOGY OFFICE NOTE   NAME:PASSAtl[place], Pelto                         MRN:          [id number]  DATE:07/04/2007                            DOB:          06/11/1951    Danielle Mckinney was seen today in the clinic on July 04, 2007, for followup  of her Brogan. Jude, model #V365 Atlas.   Date of implant was January 04, 2007, for nonischemic cardiomyopathy.   On interrogation of her device today:  Her battery voltage is 3.15 with a charge time of 10.4 seconds.  P waves measured greater than 3 millivolts with an atrial capture  threshold of 0.5 volts at 0.5 milliseconds and an atrial lead impedance  of 455 ohms.  R waves measured greater than 13 millivolts with a right ventricular  capture threshold of 0.5 volts at 0.5 milliseconds and a right  ventricular lead impedance of 350 ohms.  Left ventricular pacing threshold was 0.5 volts at 0.8 milliseconds with  a left ventricular lead impedance of 700 ohms.  Shock impedance was 43.  She is not pacemaker dependent, although she is V pacing 99% of the  time.  No changes were made in her parameters.  There were 7 mode switch  episodes, totaling less than 1% of the time.  She is on Coumadin  therapy.  And, no ventricular episodes noted.  Left arm remains slightly edematous but less than before and she said  she notices swelling more if she does too much activity with that arm.   She will be seen back in 3 months' time.      Danielle Harm, LPN  Electronically Signed      Danielle Mckinney. Ladona Ridgel, MD  Electronically Signed   PO/MedQ  DD: 07/04/2007  DT: 07/04/2007  Job #: [id number]

## 2011-03-14 NOTE — Discharge Summary (Signed)
NAME:  Danielle Mckinney, ADLER NO.:  [id number]   MEDICAL RECORD NO.:  [id number]          PATIENT TYPE:  INP   LOCATION:  2010                         FACILITY:  [facility]   PHYSICIAN:  Thereasa Solo. Little, M.D. DATE OF BIRTH:  12/19/1950   DATE OF ADMISSION:  08/09/2007  DATE OF DISCHARGE:  08/10/2007                               DISCHARGE SUMMARY   DISCHARGE DIAGNOSES:  1. Mild acute heart failure on chronic systolic heart failure.  2. Progressive shortness of breath, resolved.  3. Chest discomfort.  4. Ischemic cardiomyopathy with ejection fraction of 15-20%.  The      patient has an implantable cardioverter-defibrillator.  5. 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  6. Anticoagulation on Coumadin.  7. Hypertension, controlled.   CONDITION ON DISCHARGE:  Improved.   PROCEDURES:  None.   DISCHARGE MEDICATIONS:  1. Coreg 6.25 mg twice a day.  2. Coumadin 4 mg alternating with 6 mg daily.  3. Spironolactone 25 mg daily.  4. Lisinopril 10 mg daily.  5. Lasix 20 mg daily.  6. Protonix 40 mg daily.  7. Potassium 20 mEq daily.  8. Temazepam 15 mg as needed.  9. Caltrate plus vitamin D 600 mg daily.   He is to stop taking the lisinopril/HCTZ.   DIET:  Low-sodium heart-healthy diet.   ACTIVITY:  No restrictions on activity.   FOLLOW UP:  Follow up with Dr. Clarene Duke in 2 weeks.  The office will  arrange.   HISTORY OF PRESENT ILLNESS:  60 year old female with embolic CVA and  nonischemic cardiomyopathy with EF of 15-20% presented to [facility] emergency  room on August 09, 2007 complaining of chest discomfort, shortness of  breath, and fluttering.  She wanted her defibrillator checked and had  called Dr. Lubertha Basque office and was told to come to the emergency room  because he was at [facility].  She had had occasional symptoms of chest  discomfort, fluttering, and shortness of breath.  More recently,  shortness of breath has been a little increased, and last  night prior to  admission, she had to prop herself up on pillows, unable to fall asleep  until early morning hours, and she was also anxious.   PAST MEDICAL HISTORY:  1. Left cortical CVA with right-sided weakness, aphasia and dysphasia.  2. Nonischemic cardiomyopathy.  3. Hypertension.  4. History of breast cancer.  5. Clean coronaries in 2007, and that was done by [facility].   Family history, social history, review of systems see H&P.   PHYSICAL EXAMINATION ON DISCHARGE:  VITAL SIGNS:  Blood pressure 96/62,  pulse 70, respiratory 18, temperature 97.6.  She had been negative 1950  mL.  CHEST:  Clear to auscultation bilaterally.  HEART:  Regular rate and rhythm, S1-S2, with a 2/6 systolic murmur.  EXTREMITIES:  Without edema.   LABORATORY DATA:  Hemoglobin 13.9, hematocrit 41.2, WBC 5.6, platelets  335, MCV 79.2.  Chemistry:  Sodium 138, potassium 4.1, chloride 102, CO2  28, BUN 13, creatinine 0.88, glucose 104.  Pro time 26.1, INR 2.3 on  admission, PTT 36, on  discharge pro time 29.1, INR 2.6.  LFTs were  normal.  Heart:  Negative.  CKs ranged 208, 167, 167.  MBs 2.2 to 1.5.  Troponin I 0.3 to 0.02.  Negative MI.  Calcium 9.6, magnesium 2.1.  BNP  was 49.  TSH was 2.199.   Chest x-ray revealed pacemaker but no failure.   HOSPITAL COURSE:  The patient was admitted by Dr. Alanda Amass on call for  Dr. Clarene Duke secondary to increasing shortness of breath.  He interrogated  her pacemaker.  She had no tachycardiac episodes, no shock episodes, no  VT.  He felt she was stable with a monitor overnight.  We did give her  one dose of IV Lasix due to the shortness of breath.  He put her on  Lasix instead of HCTZ to assist with her shortness of breath.  Since she  did diurese and felt better, we are adding Lasix 20 mg daily to her  medical regimen and stopped the HCTZ but continuing the spironolactone  though.  Dr. Jenne Campus saw her on August 10, 2007 and felt she was ready  for discharge.  INR was  therapeutic.  She will follow up as an  outpatient.      Darcella Gasman. Ingold, N.P.    ______________________________  Thereasa Solo Little, M.D.    LRI/MEDQ  D:  08/10/2007  T:  08/11/2007  Job:  [id number]   cc:   Thereasa Solo. Little, M.D.

## 2011-03-14 NOTE — Assessment & Plan Note (Signed)
[facility]                         ELECTROPHYSIOLOGY OFFICE NOTE   NAME:PASSJelisha, Weed                         MRN:          [id number]  DATE:10/01/2007                            DOB:          Dec 09, 1950    Ms. Kargbo returns today for followup.  She is a very pleasant woman with  a history of congestive heart failure and a history of Adriamycin-  induced cardiomyopathy with left bundle branch block, who underwent Bi-V  ICD insertion back in March.  She was hospitalized back in October with  mild congestive heart failure and just spent one day in the hospital  before discharge.  She does admit to some dietary and medical  indiscretions.  She is not weighing herself regularly.  Her medications  include Carvedilol 6.25/daily, Warfarin as directed, Aldactone 25 daily,  lisinopril/HCTZ 10/12.5, potassium supplements and furosemide 20 mg  daily.  Of note, she is no longer on HCTZ.   PHYSICAL EXAMINATION:  GENERAL:  She is a pleasant young woman in no  acute distress.  VITAL SIGNS:  Blood pressure was 135/89, the pulse 77 and regular,  respirations were 18, the weight was 180 pounds.  NECK:  Revealed no jugular venous distention.  LUNGS:  Clear bilaterally to auscultation.  No wheezes, rales or rhonchi  were present.  CARDIOVASCULAR:  Regular rate and rhythm, normal S1 and S2.  EXTREMITIES:  Demonstrated trace peripheral edema bilaterally.   Interrogation of his defibrillator demonstrates a St. Atlas V-365, P-  waves are greater than 3, R-waves greater than 12, the impedance 430  in  the atrium, 345 in the right ventricle, and 710 in the left ventricle.  Threshold was at 0.5 at 0.5 in the atrium and the ventricle and 1 at 0.8  in the left ventricle.  She was 99% V-paced.   IMPRESSION:  1. Non-ischemic cardiomyopathy.  2. Congestive heart failure.  3. Left bundle branch block.  4. Status post Bi-V implantable cardiac defibrillator insertion.   DISCUSSION:  Overall, Ms. Kea is stable.  I have asked that she check  her weights and blood pressures daily, so that we can prevent her from  additional hospitalizations and heart failure.     Doylene Canning. Ladona Ridgel, MD  Electronically Signed    GWT/MedQ  DD: 10/01/2007  DT: 10/01/2007  Job #: [id number]

## 2011-03-14 NOTE — Assessment & Plan Note (Signed)
[facility]                         ELECTROPHYSIOLOGY OFFICE NOTE   NAME:Danielle Mckinney, Danielle Mckinney                         MRN:          [id number]  DATE:04/15/2007                            DOB:          May 27, 1951    HISTORY OF PRESENT ILLNESS:  Danielle Mckinney was seen today in the clinic on  April 15, 2007 at the patient's request. She was also seen earlier this  month and had some diaphragmatic stimulation that was positional and we  attempted to reproduce that in the clinic a couple of weeks ago without  success but we did program her output's down. She is still complaining  of the same symptoms and we brought her in today, rechecked her last  ventricular threshold, which was 1 at 0.3 and 0.5 at zero volts at 0.8  milliseconds. So, I programmed her down to 1.25 volts at 0.8  milliseconds and she is going to go home and see how this feels for a  couple of days and hopefully, this will remedy the situation.      Altha Harm, LPN  Electronically Signed      Doylene Canning. Ladona Ridgel, MD  Electronically Signed   PO/MedQ  DD: 04/15/2007  DT: 04/15/2007  Job #: [id number]

## 2011-03-17 NOTE — Consult Note (Signed)
NAME:  Danielle Mckinney, LANSDALE NO.:  [id number]   MEDICAL RECORD NO.:  [id number]          PATIENT TYPE:  INP   LOCATION:  3015                         FACILITY:  [facility]   PHYSICIAN:  Thereasa Solo. Little, M.D. DATE OF BIRTH:  11/17/1950   DATE OF CONSULTATION:  DATE OF DISCHARGE:                                   CONSULTATION   HISTORY OF PRESENT ILLNESS:  This 60 year old female was admitted with  aphasia and right-sided body weakness.  An echocardiogram done as part of  her evaluation showed severe LV dysfunction with an ejection fraction less  than 20% with no obvious source of cardiac emboli noted.  Review of her EKGs  have shown an intermittent left bundle-branch block.  She has no prior  history of coronary disease.  She was seen at [facility] Emergency Room on  July 29, 2006, complaining of shortness of breath.  A BNP performed at  that time was 124.  Her chest x-ray showed normal cardiac size and no  congestive heart failure.  She was started on prednisone.   The majority of my history comes from the daughters.  The patient can shake  her head yes and no.  She has had no problems with peripheral edema, denied  any episodes of chest pain, and has had no syncopal episodes.   PAST MEDICAL HISTORY:  Her pertinent past history includes breast cancer in  1998, treated with both radiation and chemotherapy, followed by Dr. Ruthann Cancer.  Her children are adult.  She has had a C-section and a  hysterectomy.  She has a history of hypertension.  She had a bone marrow  transplant and bilateral mastectomies.   CURRENT MEDICATIONS:  Current medications, according to the medication  reconciliation sheet, include hydrocodone, prednisone, Evista, meclizine,  and Xanax.  According to the [facility] Note, however, she was on  lisinopril as an outpatient for her hypertension.   ALLERGIES:  PENICILLIN.   FAMILY HISTORY:  Mother died of a pulmonary embolus following knee  replacement in the 1960s.  Father died from alcoholism.  She has 3 brothers  with hypertension.   SOCIAL HISTORY:  She has 2 daughters, lives with 1, is an Recruitment consultant, and in Airline pilot at [employer].  No alcohol.  No tobacco products.   REVIEW OF SYSTEMS:  Complains of recent tiredness and fatigue; shortness of  breath 3 weeks ago, as mentioned above; no PND or orthopnea; no lower  extremity edema; no chest pain; no recent viral illness.   PHYSICAL EXAMINATION:  VITAL SIGNS:  Blood pressure 115/70, heart rate 91,  respiratory rate 20, oxygen saturation 98% on room air, temperature 98.3.  Her monitor strip has shown sinus rhythm and sinus tachycardia with a left  bundle-branch block.  LUNGS:  Her lungs were clear.  No rales or wheezes.  CARDIAC:  Regular heart rhythm with a normal S1 and S2.  I do not appreciate  a heart murmur, nor do I hear an S3 or an S4.  NEUROLOGIC:  She has diminished grip on the right.  She has an expressive  aphasia.  ABDOMEN:  Her abdomen is soft and nontender.  EXTREMITIES:  She has no carotid bruits, no lower extremity edema, and good  pulses.   ASSESSMENT:  1. Cardiomyopathy with a less than 20% ejection fraction.  As best I can      tell, this is new.  There are no prior echos, but a chest x-ray done      September of 2007 showed normal cardiac size.  The differential      diagnoses would be viral, coronary disease (I doubt this), medication-      induced (I have called Dr. Darnelle Catalan to see which drugs were used for      chemotherapy for her breast cancer), alcohol (I doubt this.  She does      not drink.), and postpartum; however, her children are now adults.  The      most likely cause will end up being idiopathic.  2. I have started her on Coreg 3.125 mg p.o. b.i.d. and will try to add an      ACE inhibitor if her blood pressure will allow.  She is currently on a      Catapres patch, which may need to be discontinued once she can take       p.o.'s better.  She did fail her swallowing test.  3. One other major concern is whether or not she needs to be on Coumadin      in view of the low ejection fraction and no obvious source for her      stroke.  Cardiac emboli is still high on the list, although no specific      thrombus or clot was seen.  4. If her ejection fraction remains low for several months, then an      automated implantable cardioverter-defibrillator may need to be placed      in the future.           ______________________________  Thereasa Solo Little, M.D.     ABL/MEDQ  D:  08/15/2006  T:  08/16/2006  Job:  [id number]   cc:   Valentino Hue. Magrinat, M.D.  Dr. Gale Journey

## 2011-03-17 NOTE — Letter (Signed)
December 24, 2006    Thereasa Solo. Little, M.D.  [address]  [address]  [place], [place] [address]   RE:  [name], [name]  MRN:  [id number]  /  DOB:  April 11, 1951   Dear Mindi Junker,   Thank you for referring Danielle Mckinney for EP evaluation and consideration  for ICD implantation. As you know, she is a very pleasant, middle-age  woman with multiple medical problems including a history of breast  cancer with recurrence status post mastectomy and Adriamycin-induced  nonischemic cardiomyopathy. She also has a history of a stroke. She has  left bundle branch block and she has very significant congestive heart  failure despite maximum medical therapy.   Today we saw Danielle Mckinney on your referral and discussed the treatment  options for improving her congestive heart failure symptoms and  prevention of sudden cardiac death. I discussed the risks, benefits,  goals and expectations of prophylactic biventricular ICD implantation  with the patient and her daughter who is with her today. The risks,  benefits, goals and expectations of the procedure have been discussed  and she would like to proceed with this. This will be  scheduled at the earliest possible convenient time. Once again, thanks  again for referring Danielle Mckinney for EP evaluation and consideration for ICD  implantation, I will keep you apprised at how she does with her  procedure.    Sincerely,      Doylene Canning. Ladona Ridgel, MD  Electronically Signed    GWT/MedQ  DD: 12/24/2006  DT: 12/25/2006  Job #: [id number]   CC:    Carola J. Gerri Spore, M.D.

## 2011-03-17 NOTE — Op Note (Signed)
NAME:  Danielle Mckinney, Danielle Mckinney NO.:  [id number]   MEDICAL RECORD NO.:  [id number]          PATIENT TYPE:  INP   LOCATION:  2807                         FACILITY:  [facility]   PHYSICIAN:  Doylene Canning. 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  The patient has worsening congestive heart  failure and EF of 25% obtained by 2-D echo several months ago and is now  referred for upgrade to a biventricular ICD.   PROCEDURE:  After informed consent was obtained, the patient was taken  to the diagnostic EP lab in the fasting state.  Usual preparation and  draping, intravenous fentanyl and Midazolam was given for sedation.  30  mL of lidocaine was infiltrated into the left infraclavicular region.  A  7 cm incision was carried out over this region.  Electrocautery utilized  to dissect down to the fascial plane.  The left subclavian vein was  subsequently punctured x3 and the St. Jude Riata model 7001 active  fixation defibrillation lead, serial number [id number] was advanced into  the right ventricle, the St. Jude 1788T, 52-cm active fixation pacing  lead serial number [id number] was advanced into the right atrium.  Initially manipulation of the RV lead was carried out and the lead was  placed on the RV septum where R-waves were 20 mV and the impedance 700  ohms, thresholds were 0.4 volts of 0.5 milliseconds.  Once the lead was  in satisfactory position, attention was then turned to placement of the  atrial lead which was  placed in the anterolateral portion the right  atrium where P-waves measured 9 mV and the impedance 645 ohms with the  pacing threshold 0.4 volts, 0.5 milliseconds.  Ten volt pacing in both  atrial and ventricle did not stimulate the diaphragm.  With satisfactory  position of both atrial and ventricular leads, attention then turned to  placement of the left ventricular lead.  The guiding catheter was  utilized.  A St. Jude guiding catheter and a 6-French EP catheter were  utilized to cannulate the coronary sinus.  With this accomplished  venography of the coronary sinus was carried out demonstrating a fairly  small lateral vein, a very small posterolateral vein and a middle  cardiac vein.  The lateral vein was chosen for LV lead placement.  The  Asahi guidewire was advanced into the lateral vein without particular  difficulty and the St. Jude model 0154, serial number [id number], LV  pacing lead was advanced over the guide wire and into the lateral vein.  It should be noted that the lateral vein had to branches, a superior  branch which looked to  be best for LV lead placement and an inferior  branch which looked to be less good for LV placement.  The superior  branch was chosen and the lead was advanced approximately two-thirds  from base to apex into the lateral wall of the left ventricle.  In this  location the threshold for pacing was 0.5 volts at 0.5 milliseconds,  diaphragmatic stimulation was present at 4 volts of 0.5.  This raised  concern that this may ultimately be a problem.  Unipolar and bipolar  configurations were obtained.  It was because of the very nice location  of the lead, because of its very secure location, because of its very  good pacing threshold it was deemed that this would be the most  appropriate place for the lead and so it was placed in this location.  At this point the leads were secured to the subpectoralis fascia with a  silk suture.  The lead was also  secured with silk suture.  Electrocautery utilized to make subcutaneous pocket.  Kanamycin  irrigation was utilized to irrigate the pocket with cautery utilized to  assure hemostasis.  The St. Jude Atlas 2HF  model V-365 bi-V ICD serial  number [id number] was connected to the RA, RV and LV leads and placed back  in the subcutaneous pocket.  Generator secured with silk suture.  Additional kanamycin was utilized to irrigate the pocket and  defibrillation threshold testing carried out.   After the patient was more deeply sedated with fentanyl and Versed, VF  was induced with T-wave shock and a 15 joules shock was subsequently  delivered which terminated VF and restored sinus rhythm.  Five minutes  was allowed to elapse and a second defibrillation threshold test carried  out.  Again VF was induced with a T-wave shock and a 15 joule shock was  again delivered terminating VF and restoring sinus rhythm.  At this  point no additional defibrillation threshold testing was carried out and  the incision was closed with a layer of 2-0 Vicryl followed by a layer  of 3-0 Vicryl.  Benzoin was painted on the skin, Steri-Strips were  applied and pressures was placed and the patient was returned to her  room in satisfactory condition.  There were no complications.  There  were no immediate procedure complications.  Results demonstrate  successful implantation of a St. Jude biventricular ICD in a patient  with a nonischemic cardiomyopathy, class III heart failure, left bundle  branch block QRS duration of 164 milliseconds.      Doylene Canning. Ladona Ridgel, MD  Electronically Signed     GWT/MEDQ  D:  01/04/2007  T:  01/04/2007  Job:  [id number]   cc:   Thereasa Solo. Little, M.D.  Carola J. Gerri Spore, M.D.

## 2011-03-17 NOTE — Assessment & Plan Note (Signed)
HISTORY:  This 60 year old female admitted to [facility] on August 14, 2006, with right-sided weakness.  An MRI  demonstrated a left frontal lobe infarction extending into the temporal  lobe.  No significant ICA occlusion.  An echocardiogram showed  cardiomyopathy with a 15%-20% ejection fraction, but no evidence of  thrombus.  Placed on IV heparin and Coumadin for presumed cardioembolic  event, given her cardiomyopathy.  She was in rehab on August 16, 2006,  to August 23, 2006, and discharged home.  Has been seen by me in  followup on September 17, 2006, with continued speech therapy at that  time.  She is still continuing to receive speech therapy.  She has also  seen neurology back again and cardiology.   She has resumed driving and in fact, drove here with her daughter in the  car with her.  Her sleep is fair. She can walk 30 minutes at a time and  climb steps.  She last worked on August 13, 2006.   REVIEW OF SYSTEMS:  Positive for shortness of breath.   PHYSICAL EXAMINATION:  VITAL SIGNS:  Blood pressure 135/66, pulse 76,  respirations 16, O2 saturation 99% on room air.  GENERAL:  In no acute distress.  Mood and affect appropriate.  NEUROLOGIC:  She has good comprehension.  She has intact naming of  simple objects, but does have word-finding deficits in conversational  speech.  Her motor strength is 5/5 bilaterally.  No evidence of ataxia,  finger-to-nose-to-finger, no dysdiadochokinesis.  Gait is normal.   IMPRESSION:  Left frontal cerebrovascular accident, cardioembolic,  causing amnemonic aphasia.   PLAN:  1. Continue speech therapy.  2. As per cardiology.  3. As per neurology.  4. I will see her back on a p.r.n. basis.  I think her aphasia will      improve gradually over the next six to nine months.  At this point,      I would feel that it would interfere with her ability to work at      her previous job.  She will follow up with neurology on  this.      Erick Colace, M.D.  Electronically Signed     AEK/MedQ  D:  10/16/2006 11:09:10  T:  10/16/2006 13:39:03  Job #:  [id number]   cc:   Valentino Hue. Magrinat, M.D.  Fax: [phone]   Thereasa Solo. Little, M.D.  Fax: [phone]   Pramod P. Pearlean Brownie, MD  Fax: [phone]

## 2011-03-17 NOTE — H&P (Signed)
NAME:  Danielle Mckinney, Danielle Mckinney NO.:  [id number]   MEDICAL RECORD NO.:  [id number]          PATIENT TYPE:  IPS   LOCATION:  4032                         FACILITY:  [facility]   PHYSICIAN:  Ellwood Dense, M.D.   DATE OF BIRTH:  11/19/1950   DATE OF ADMISSION:  08/16/2006  DATE OF DISCHARGE:                                HISTORY & PHYSICAL   CARDIOLOGIST:  [facility].   ONCOLOGIST:  Valentino Hue. Magrinat, M.D.   NEUROLOGIST:  Pramod P. Pearlean Brownie, M.D.   HISTORY OF PRESENT ILLNESS:  Mos. Pruss is a 60 year old right handed adult  female admitted August 14, 2006, with slurred speech and right sided  weakness.  MRI scan of the brain showed positive moderate sized non-  hemorrhagic infarction in the mid left frontal lobe with extension  inferiorly to the left temporal lobe.  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  MRA study of the brain showed  atherosclerotic changes.  Carotid Dopplers were negative for significant  carotid stenosis.  Modified barium swallow done August 14, 2006, showed  that she could tolerate a D1 diet with nectar thick liquids.  An  echocardiogram showed an ejection fraction of 15-20% with cardiomyopathy.  [facility] followed up with the patient and  advised IV heparin and Coumadin as of August 16, 2006.  They plan for a  cardiac chest CT the first of next week with cardiology to follow up and  arrange.  There is a plan for a follow up echocardiogram in the future to  check ejection fraction.  Coreg and Clonidine have been added by cardiology  at this point.   The patient was evaluated by the rehabilitation physicians and felt to be an  appropriate candidate for inpatient rehabilitation.   REVIEW OF SYSTEMS:  Positive for anxiety.   PAST MEDICAL HISTORY:  1. Hypertension.  2. History of breast cancer treated with chemotherapy and radiation in      1998 along  with bilateral lumpectomies.  3. Anxiety.   FAMILY HISTORY:  Positive for deep venous thrombosis.   SOCIAL HISTORY:  The patient lives with one of her daughters.  This daughter  works as a Diplomatic Services operational officer.  The patient is on her own at home during the day.  She does not use alcohol or tobacco.  She lives in a one level home with one  step to enter.  The family can only provide minimal assistance on a periodic  basis.   FUNCTIONAL HISTORY:  Prior to admission, independent.   ALLERGIES:  PENICILLIN AND CODEINE.   MEDICATIONS PRIOR TO ADMISSION:  1. Prednisone 10 mg taper.  2. E-Vista 60 mg daily.  3. Temazepam q.h.s. p.r.n.  4. Hydrocodone p.r.n.   LABORATORY DATA:  Recent labs showed a hemoglobin 12.1, hematocrit 36.6,  platelet count 248,000, white count 6.6.  Recent cholesterol was 161.  Recent sodium was 139, potassium 3.5, chloride 103, CO2 24, BUN 14,  creatinine 1.2.   PHYSICAL EXAMINATION:  GENERAL:  Reasonably well appearing,  middle aged adult female lying in bed  in mild to no acute discomfort.  VITAL SIGNS:  Blood pressure 107/69 with a pulse of 91, respiratory rate 18,  temperature 99.  HEENT:  Normocephalic, atraumatic with shaved head even prior to admission.  CARDIOVASCULAR:  Regular rate and rhythm, S1 and S2 without murmurs.  ABDOMEN:  Soft, nontender, nondistended, positive bowel sounds.  LUNGS:  Clear to auscultation bilaterally.  NEUROLOGICAL:  Alert and oriented x questionable 2-3.  She is aphasic and  has difficulty answering questions with only minimal soft answers.  Bilateral upper extremity exam showed weakness in the right upper extremity  with strength approximately 3+/5 distally and 3-/5 proximally.  Left upper  extremity was 4+/5.  Sensation was slightly decreased to light touch  involving the right upper extremity.  Cranial nerves showed tongue slightly  deviated to the right with facial weakness on the right.  Eye closure was  good bilaterally and  extraocular muscles were intact.  Lower extremity exam  showed weakness on the right with strength of approximately 3+/5.  Bulk and  tone were normal.  Reflexes were 1+ in the right lower extremity.  Left  lower extremity strength was 5-/5 with intact reflexes and sensation.   IMPRESSION:  1. 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  2. Cardiomyopathy with ejection fraction of 15-20% with plans for follow      up cardiac CT.   Presently, the patient has deficits in ADLs, transfers, and ambulation  secondary to the above noted left cortical stroke with, also, recent  diagnosis of cardiomyopathy requiring Coumadin therapy.   PLAN:  1. Admit to the rehabilitation unit for daily speech therapy, physical      therapy and occupational therapy to advance ADLs, transfers,      ambulation, and cognitive abilities as appropriate along with      swallowing.  2. Check admission labs including CBC and CMET Friday, August 17, 2006.  3. 24 hour nursing for medication administration, monitoring of vital      signs, and monitoring of bowel and bladder function.  4. Continue IV heparin and Coumadin per pharmacy.  5. Cranial CT August 17, 2006, per NEFT #2 study protocol.  6. Monitor hypertension on Clonidine patch 0.2 mg change each week and      Coreg 3.125 mg p.o. b.i.d.  7. Continue Protonix 40 mg daily for GI prophylaxis.  8. D1 nectar thick diet.  9. Continue Temazepam 1 mg p.o. q.h.s. p.r.n.   PROGNOSIS:  Good.   ESTIMATED LENGTH OF STAY:  8-12 days.   GOALS:  Modified independent ADLs, transfers, and ambulation at supervision  level.           ______________________________  Ellwood Dense, M.D.     DC/MEDQ  D:  08/16/2006  T:  08/16/2006  Job:  [id number]

## 2011-03-17 NOTE — Assessment & Plan Note (Signed)
Monday, September 17, 2006   PRIMARY CARE PHYSICIAN:  Dr. Pearlean Brownie and Dr. Clarene Duke at [facility] and  Dr. Ruthann Cancer.   HISTORY:  A 60 year old female who was admitted to [facility] on October 16  with right-sided weakness, MRI demonstrating a left frontal lobe infarct  extending into the temporal lobe. No significant ICA occlusion. An  echocardiogram showed cardiomyopathy, 15 to 20% ejection fraction with no  evidence of thrombus. Placed on IV heparin and Coumadin for presumed  cardioembolic given her cardiomyopathy. She was at rehabilitation from  August 16, 2006 to August 23, 2006 and discharged home and has followed up  with cardiology who has been monitoring pro times and will see Dr. Pearlean Brownie  from neurology today. This is her first rehabilitation medicine follow up.   CURRENT MEDICATIONS:  1. Protonix.  2. Coreg.  3. Aldactone.  4. Maxzide.  5. Altace.  6. Potassium chloride.   She has had no falls. No seizures. She is not employed at the current time.  She can walk 30 minutes at a time. She climbs steps. She does not drive  currently, although she did start up her car which is a manual transmission.   PHYSICAL EXAMINATION:  GENERAL:  No acute distress. Mood and affect  appropriate. Her speech shows dysfluency. She is able to repeat no ifs,  ands, or buts. She is also demonstrating good comprehension; however, she  has difficulty with [employer], indicating significant element of  dysarthria. She remembers 1/3 objects after delay, indicating some short  term memory reduction.   Her motor strength is 5/5 bilaterally. She has no evidence of ataxia on  finger, nose to finger testing, and she demonstrates no evidence of  dysdiadochokinesis.   Her gait is normal. She is able to heel walk, toe walk. Her visual fields  are intact to confrontation testing.   IMPRESSION:  Left frontal cerebral vascular accident, cardioembolic, causing  anomic aphasia and  probable mild memory deficits. However, this is  difficulty to assess in the face of word finding deficits.   PLAN:  I think speech therapy is the only therapy needed at this time. I do  think she can presume driving with a licensed driver in an empty parking  lot, progressing to empty street. Daytime driving only. I will see her back  in one month. She will follow up with Dr. Pearlean Brownie as well as cardiology.      Erick Colace, M.D.  Electronically Signed     AEK/MedQ  D:  09/17/2006 10:35:45  T:  09/17/2006 12:11:41  Job #:  [id number]

## 2011-03-17 NOTE — Procedures (Signed)
EEG#: [id number]   REFERRING PHYSICIAN:  Lesia Sago, M.D.   CLINICAL HISTORY:  A 60 year old lady with an episode of aphasia, right-  sided weakness, being evaluated for possible seizures.   MEDICATION LISTED:  1. Lasix.  2. Catapres.  3. Reglan.  4. Tylenol.   The background awake rhythm consists of 8 to 9 Hz alpha rhythm which is of  moderate amplitude and reactive to eye opening and closure.  No paroxysmal  epileptiform activity, spikes or sharp waves are seen.  The record is  significant for drowsiness but no deep sleep stages are seen.  Length of the  EEG is 21 minutes.  Technical component is adequate.  EKG tracing reveals  regular sinus rhythm.  Hyperventilation was not performed.  Photic  stimulation was unremarkable.   IMPRESSION:  This EEG performed during awake and drowsy states is within  normal limits.  No definitive epileptiform activity is identified.           ______________________________  Sunny Schlein. Pearlean Brownie, MD     HQI:ONGE  D:  08/15/2006 17:17:00  T:  08/17/2006 10:05:12  Job #:  [id number]   cc:   Marlan Palau, M.D.  Fax: [phone]

## 2011-04-06 ENCOUNTER — Ambulatory Visit (INDEPENDENT_AMBULATORY_CARE_PROVIDER_SITE_OTHER): Payer: Medicare Other | Admitting: *Deleted

## 2011-04-06 DIAGNOSIS — I428 Other cardiomyopathies: Secondary | ICD-10-CM

## 2011-04-06 DIAGNOSIS — I4891 Unspecified atrial fibrillation: Secondary | ICD-10-CM

## 2011-04-07 ENCOUNTER — Other Ambulatory Visit: Payer: Self-pay | Admitting: Internal Medicine

## 2011-04-13 NOTE — Progress Notes (Signed)
icd remote check  

## 2011-04-24 ENCOUNTER — Encounter: Payer: Self-pay | Admitting: *Deleted

## 2011-07-13 ENCOUNTER — Ambulatory Visit (INDEPENDENT_AMBULATORY_CARE_PROVIDER_SITE_OTHER): Payer: Medicare Other | Admitting: *Deleted

## 2011-07-13 DIAGNOSIS — I428 Other cardiomyopathies: Secondary | ICD-10-CM

## 2011-07-14 ENCOUNTER — Other Ambulatory Visit: Payer: Self-pay | Admitting: Internal Medicine

## 2011-07-14 ENCOUNTER — Encounter: Payer: Self-pay | Admitting: Internal Medicine

## 2011-07-16 LAB — REMOTE ICD DEVICE
AL IMPEDENCE ICD: 385 Ohm
CHARGE TIME: 13.4 s
DEVICE MODEL ICD: 371394
HV IMPEDENCE: 43 Ohm
LV LEAD IMPEDENCE ICD: 650 Ohm
RV LEAD IMPEDENCE ICD: 350 Ohm
TZAT-0001SLOWVT: 1
TZAT-0004SLOWVT: 8
TZAT-0012SLOWVT: 200 ms
TZAT-0013FASTVT: 1
TZAT-0013SLOWVT: 4
TZAT-0018FASTVT: NEGATIVE
TZAT-0019FASTVT: 7.5 V
TZAT-0020SLOWVT: 1 ms
TZON-0003FASTVT: 300 ms
TZON-0004FASTVT: 12
TZON-0004SLOWVT: 12
TZON-0005FASTVT: 6
TZON-0005SLOWVT: 6
TZON-0010FASTVT: 80 ms
TZST-0001FASTVT: 2
TZST-0001FASTVT: 5
TZST-0001SLOWVT: 3
TZST-0001SLOWVT: 5
TZST-0003FASTVT: 36 J
TZST-0003FASTVT: 36 J
TZST-0003SLOWVT: 15 J
TZST-0003SLOWVT: 25 J
TZST-0003SLOWVT: 36 J

## 2011-07-28 NOTE — Progress Notes (Signed)
ICD checked by remote. 

## 2011-07-31 LAB — COMPREHENSIVE METABOLIC PANEL
ALT: 17
AST: 24
Albumin: 4.1
Alkaline Phosphatase: 82
BUN: 13
CO2: 29
Calcium: 10
Chloride: 101
Creatinine, Ser: 0.89
GFR calc Af Amer: >60
GFR calc non Af Amer: >60
Glucose, Bld: 102 — ABNORMAL HIGH
Potassium: 3.7
Sodium: 139
Total Bilirubin: 0.6
Total Protein: 7.6

## 2011-07-31 LAB — DIFFERENTIAL
Basophils Relative: 0
Eosinophils Absolute: 0.1
Eosinophils Relative: 3
Neutrophils Relative %: 38 — ABNORMAL LOW

## 2011-07-31 LAB — URINALYSIS, ROUTINE W REFLEX MICROSCOPIC
Glucose, UA: NEGATIVE
Ketones, ur: NEGATIVE
Nitrite: NEGATIVE
Protein, ur: NEGATIVE
pH: 6

## 2011-07-31 LAB — PROTIME-INR
INR: 2 — ABNORMAL HIGH
Prothrombin Time: 24.3 — ABNORMAL HIGH

## 2011-07-31 LAB — D-DIMER, QUANTITATIVE (NOT AT ARMC): D-Dimer, Quant: 0.44

## 2011-07-31 LAB — APTT: aPTT: 34

## 2011-07-31 LAB — CBC
Hemoglobin: 13.2
MCHC: 33.2
RBC: 4.95
WBC: 4.8

## 2011-07-31 LAB — POCT CARDIAC MARKERS
Myoglobin, poc: 98.6
Troponin i, poc: <0.05

## 2011-08-10 LAB — DIFFERENTIAL
Basophils Absolute: 0
Eosinophils Relative: 3
Lymphocytes Relative: 39
Lymphs Abs: 2.2
Neutro Abs: 2.9
Neutrophils Relative %: 51

## 2011-08-10 LAB — COMPREHENSIVE METABOLIC PANEL
AST: 22
BUN: 13
CO2: 28
Calcium: 10.3
Chloride: 102
Creatinine, Ser: 0.88
GFR calc Af Amer: >60
GFR calc non Af Amer: >60
Glucose, Bld: 104 — ABNORMAL HIGH
Total Bilirubin: 0.8

## 2011-08-10 LAB — CK TOTAL AND CKMB (NOT AT ARMC)
CK, MB: 1.5
Relative Index: 0.9
Total CK: 167
Total CK: 167
Total CK: 208 — ABNORMAL HIGH

## 2011-08-10 LAB — CBC
HCT: 41.2
MCV: 79.2
Platelets: 335
RDW: 16.2 — ABNORMAL HIGH
WBC: 5.6

## 2011-08-10 LAB — PROTIME-INR
INR: 2.3 — ABNORMAL HIGH
Prothrombin Time: 26.1 — ABNORMAL HIGH
Prothrombin Time: 29.1 — ABNORMAL HIGH

## 2011-08-10 LAB — TROPONIN I
Troponin I: 0.03
Troponin I: 0.03

## 2011-08-10 LAB — BASIC METABOLIC PANEL
CO2: 29
Chloride: 99
Creatinine, Ser: 1
GFR calc Af Amer: >60
Sodium: 139

## 2011-08-10 LAB — APTT: aPTT: 36

## 2011-08-10 LAB — MAGNESIUM: Magnesium: 2.1

## 2011-08-11 ENCOUNTER — Encounter: Payer: Self-pay | Admitting: *Deleted

## 2011-08-17 ENCOUNTER — Ambulatory Visit (INDEPENDENT_AMBULATORY_CARE_PROVIDER_SITE_OTHER): Payer: Medicare Other | Admitting: *Deleted

## 2011-08-17 DIAGNOSIS — I428 Other cardiomyopathies: Secondary | ICD-10-CM

## 2011-08-17 DIAGNOSIS — I4891 Unspecified atrial fibrillation: Secondary | ICD-10-CM

## 2011-08-17 DIAGNOSIS — I509 Heart failure, unspecified: Secondary | ICD-10-CM

## 2011-08-17 DIAGNOSIS — Z95810 Presence of automatic (implantable) cardiac defibrillator: Secondary | ICD-10-CM

## 2011-08-25 ENCOUNTER — Encounter: Payer: Self-pay | Admitting: Internal Medicine

## 2011-08-25 LAB — REMOTE ICD DEVICE
ATRIAL PACING ICD: 8 pct
BRDY-0002RA: 60 {beats}/min
BRDY-0003RA: 120 {beats}/min
LV LEAD IMPEDENCE ICD: 720 Ohm
TZAT-0001SLOWVT: 1
TZAT-0004SLOWVT: 8
TZAT-0012SLOWVT: 200 ms
TZAT-0013FASTVT: 1
TZAT-0018FASTVT: NEGATIVE
TZAT-0018SLOWVT: NEGATIVE
TZAT-0019SLOWVT: 7.5 V
TZAT-0020FASTVT: 1 ms
TZON-0003FASTVT: 300 ms
TZON-0003SLOWVT: 345 ms
TZON-0004SLOWVT: 12
TZON-0005FASTVT: 6
TZST-0001FASTVT: 2
TZST-0001FASTVT: 4
TZST-0001SLOWVT: 3
TZST-0001SLOWVT: 5
TZST-0003FASTVT: 36 J
TZST-0003SLOWVT: 15 J
TZST-0003SLOWVT: 36 J

## 2011-08-31 NOTE — Progress Notes (Signed)
icd remote check  

## 2011-09-22 ENCOUNTER — Encounter: Payer: Self-pay | Admitting: *Deleted

## 2011-10-04 ENCOUNTER — Encounter: Payer: Self-pay | Admitting: Internal Medicine

## 2011-10-06 ENCOUNTER — Encounter: Payer: Self-pay | Admitting: Internal Medicine

## 2011-10-06 ENCOUNTER — Ambulatory Visit (INDEPENDENT_AMBULATORY_CARE_PROVIDER_SITE_OTHER): Payer: Medicare Other | Admitting: Internal Medicine

## 2011-10-06 DIAGNOSIS — I4891 Unspecified atrial fibrillation: Secondary | ICD-10-CM

## 2011-10-06 DIAGNOSIS — I509 Heart failure, unspecified: Secondary | ICD-10-CM

## 2011-10-06 DIAGNOSIS — Z95810 Presence of automatic (implantable) cardiac defibrillator: Secondary | ICD-10-CM

## 2011-10-06 LAB — ICD DEVICE OBSERVATION
AL IMPEDENCE ICD: 370 Ohm
AL THRESHOLD: 0.5 V
BAMS-0001: 180 {beats}/min
BAMS-0003: 70 {beats}/min
CHARGE TIME: 13.8 s
HV IMPEDENCE: 43 Ohm
LV LEAD IMPEDENCE ICD: 640 Ohm
RV LEAD THRESHOLD: 1 V
TZAT-0001FASTVT: 1
TZAT-0001SLOWVT: 1
TZAT-0004FASTVT: 8
TZAT-0012FASTVT: 200 ms
TZAT-0013FASTVT: 1
TZAT-0018SLOWVT: NEGATIVE
TZON-0004FASTVT: 12
TZON-0010SLOWVT: 80 ms
TZST-0001FASTVT: 3
TZST-0001FASTVT: 5
TZST-0001SLOWVT: 2
TZST-0001SLOWVT: 5
TZST-0003FASTVT: 25 J
TZST-0003FASTVT: 36 J
TZST-0003SLOWVT: 36 J

## 2011-10-06 NOTE — Assessment & Plan Note (Signed)
Chronic systolic heart failure remains class II. She will continue her medical therapy and I've encouraged her to maintain a low-sodium diet.

## 2011-10-06 NOTE — Assessment & Plan Note (Signed)
Her device is working normally but is approaching elective replacement. We'll plan to recheck her device in the next month or 2.

## 2011-10-06 NOTE — Patient Instructions (Signed)
Remote monitoring is used to monitor your Pacemaker of ICD from home. This monitoring reduces the number of office visits required to check your device to one time per year. It allows [place] to keep an eye on the functioning of your device to ensure it is working properly. You are scheduled for a device check from home on 11-02-2011 for battery check. You may send your transmission at any time that day. If you have a wireless device, the transmission will be sent automatically. After your physician reviews your transmission, you will receive a postcard with your next transmission date.

## 2011-10-06 NOTE — Progress Notes (Signed)
HPI Mrs. Wilmeth returns today for followup. She is a very pleasant 60 year old woman with a nonischemic cardiomyopathy, hypertension, status post ICD implantation. She continues to do well. She has class II heart failure symptoms. She denies chest pain or shortness of breath. Allergies  Allergen Reactions  . Codeine   . Penicillins      Current Outpatient Prescriptions  Medication Sig Dispense Refill  . Calcium Carbonate-Vit D-Min (CALTRATE 600+D PLUS PO) Take 2 tablets by mouth daily.        . carvedilol (COREG) 6.25 MG tablet Take 6.25 mg by mouth 2 (two) times daily with a meal. 1- 1/2 tablets       . fish oil-omega-3 fatty acids 1000 MG capsule Take 2 g by mouth daily.        . furosemide (LASIX) 40 MG tablet Take 40 mg by mouth as needed.        [name] Kitchen losartan (COZAAR) 50 MG tablet Take 50 mg by mouth daily.        . potassium chloride (KLOR-CON) 20 MEQ packet Take 20 mEq by mouth as needed.        [name] Kitchen spironolactone (ALDACTONE) 25 MG tablet Take 25 mg by mouth daily.        . temazepam (RESTORIL) 15 MG capsule Take 15 mg by mouth at bedtime as needed.        . warfarin (COUMADIN) 4 MG tablet Take 4 mg by mouth daily.           Past Medical History  Diagnosis Date  . Atrial fibrillation   . Cancer   . CHF (congestive heart failure)   . HTN (hypertension)     ROS:   All systems reviewed and negative except as noted in the HPI.   Past Surgical History  Procedure Date  . Pacemaker insertion   . Mastectomy   . Oophorectomy   . Transthoracic echocardiogram 08/14/06     Family History  Problem Relation Age of Onset  . Coronary artery disease Other   . Hypertension       History   Social History  . Marital Status: Divorced    Spouse Name: N/A    Number of Children: N/A  . Years of Education: N/A   Occupational History  . Not on file.   Social History Main Topics  . Smoking status: Never Smoker   . Smokeless tobacco: Not on file  . Alcohol Use: No  . Drug Use:  No  . Sexually Active: Not on file   Other Topics Concern  . Not on file   Social History Narrative  . No narrative on file     BP 130/80  Pulse 65  Wt 83.462 kg (184 lb)  Physical Exam:  Well appearing NAD HEENT: Unremarkable Neck:  No JVD, no thyromegally Lymphatics:  No adenopathy Back:  No CVA tenderness Lungs:  Clear with no wheezes, rales, or rhonchi. Well-healed ICD incision. HEART:  Regular rate rhythm, no murmurs, no rubs, no clicks Abd:  soft, positive bowel sounds, no organomegally, no rebound, no guarding Ext:  2 plus pulses, no edema, no cyanosis, no clubbing Skin:  No rashes no nodules Neuro:  CN II through XII intact, motor grossly intact  DEVICE  Normal device function.  See PaceArt for details.  Approaching ERI.  Assess/Plan:

## 2011-10-25 ENCOUNTER — Encounter: Payer: Self-pay | Admitting: Internal Medicine

## 2011-11-02 ENCOUNTER — Ambulatory Visit (INDEPENDENT_AMBULATORY_CARE_PROVIDER_SITE_OTHER): Payer: Medicare Other | Admitting: *Deleted

## 2011-11-02 DIAGNOSIS — I509 Heart failure, unspecified: Secondary | ICD-10-CM

## 2011-11-02 DIAGNOSIS — Z95810 Presence of automatic (implantable) cardiac defibrillator: Secondary | ICD-10-CM

## 2011-11-02 DIAGNOSIS — I4891 Unspecified atrial fibrillation: Secondary | ICD-10-CM

## 2011-11-04 ENCOUNTER — Other Ambulatory Visit: Payer: Self-pay | Admitting: Internal Medicine

## 2011-11-04 ENCOUNTER — Encounter: Payer: Self-pay | Admitting: Internal Medicine

## 2011-11-04 LAB — REMOTE ICD DEVICE
ATRIAL PACING ICD: 4 pct
BAMS-0001: 180 {beats}/min
BAMS-0003: 70 {beats}/min
BATTERY VOLTAGE: 2.5 V
MODE SWITCH EPISODES: 43
RV LEAD IMPEDENCE ICD: 320 Ohm
TZAT-0012FASTVT: 200 ms
TZAT-0012SLOWVT: 200 ms
TZAT-0013FASTVT: 1
TZAT-0013SLOWVT: 4
TZAT-0020SLOWVT: 1 ms
TZON-0004SLOWVT: 12
TZST-0001FASTVT: 5
TZST-0001SLOWVT: 5
TZST-0003FASTVT: 25 J
TZST-0003FASTVT: 36 J
TZST-0003FASTVT: 36 J
TZST-0003SLOWVT: 25 J
TZST-0003SLOWVT: 36 J
VENTRICULAR PACING ICD: 98 pct

## 2011-11-09 NOTE — Progress Notes (Signed)
Remote icd check  

## 2011-11-15 ENCOUNTER — Encounter: Payer: Self-pay | Admitting: *Deleted

## 2011-11-20 ENCOUNTER — Encounter: Payer: Self-pay | Admitting: Internal Medicine

## 2011-11-20 ENCOUNTER — Emergency Department (HOSPITAL_COMMUNITY)
Admission: EM | Admit: 2011-11-20 | Discharge: 2011-11-20 | Disposition: A | Discharge status: Home / Self Care | Payer: Medicare Other | Attending: Emergency Medicine | Admitting: Emergency Medicine

## 2011-11-20 ENCOUNTER — Encounter (HOSPITAL_COMMUNITY): Payer: Self-pay

## 2011-11-20 ENCOUNTER — Ambulatory Visit (INDEPENDENT_AMBULATORY_CARE_PROVIDER_SITE_OTHER): Payer: Medicare Other | Admitting: *Deleted

## 2011-11-20 ENCOUNTER — Other Ambulatory Visit: Payer: Self-pay

## 2011-11-20 ENCOUNTER — Telehealth: Payer: Self-pay | Admitting: Internal Medicine

## 2011-11-20 DIAGNOSIS — Z7901 Long term (current) use of anticoagulants: Secondary | ICD-10-CM | POA: Insufficient documentation

## 2011-11-20 DIAGNOSIS — R21 Rash and other nonspecific skin eruption: Secondary | ICD-10-CM | POA: Insufficient documentation

## 2011-11-20 DIAGNOSIS — Z8673 Personal history of transient ischemic attack (TIA), and cerebral infarction without residual deficits: Secondary | ICD-10-CM | POA: Insufficient documentation

## 2011-11-20 DIAGNOSIS — Y838 Other surgical procedures as the cause of abnormal reaction of the patient, or of later complication, without mention of misadventure at the time of the procedure: Secondary | ICD-10-CM | POA: Insufficient documentation

## 2011-11-20 DIAGNOSIS — Z79899 Other long term (current) drug therapy: Secondary | ICD-10-CM | POA: Insufficient documentation

## 2011-11-20 DIAGNOSIS — I509 Heart failure, unspecified: Secondary | ICD-10-CM

## 2011-11-20 DIAGNOSIS — I10 Essential (primary) hypertension: Secondary | ICD-10-CM | POA: Insufficient documentation

## 2011-11-20 DIAGNOSIS — I428 Other cardiomyopathies: Secondary | ICD-10-CM

## 2011-11-20 DIAGNOSIS — T82897A Other specified complication of cardiac prosthetic devices, implants and grafts, initial encounter: Secondary | ICD-10-CM | POA: Insufficient documentation

## 2011-11-20 DIAGNOSIS — I4891 Unspecified atrial fibrillation: Secondary | ICD-10-CM | POA: Insufficient documentation

## 2011-11-20 DIAGNOSIS — F411 Generalized anxiety disorder: Secondary | ICD-10-CM | POA: Insufficient documentation

## 2011-11-20 DIAGNOSIS — Z95810 Presence of automatic (implantable) cardiac defibrillator: Secondary | ICD-10-CM

## 2011-11-20 HISTORY — DX: Cerebral infarction, unspecified: I63.9

## 2011-11-20 LAB — ICD DEVICE OBSERVATION
BAMS-0001: 180 {beats}/min
BAMS-0003: 70 {beats}/min
BATTERY VOLTAGE: 2.47 V
TZAT-0001FASTVT: 1
TZAT-0004SLOWVT: 8
TZAT-0012SLOWVT: 200 ms
TZAT-0013FASTVT: 1
TZAT-0013SLOWVT: 4
TZAT-0018SLOWVT: NEGATIVE
TZAT-0019SLOWVT: 7.5 V
TZAT-0020FASTVT: 1 ms
TZON-0003FASTVT: 300 ms
TZON-0003SLOWVT: 345 ms
TZON-0004FASTVT: 12
TZON-0004SLOWVT: 12
TZON-0010FASTVT: 80 ms
TZON-0010SLOWVT: 80 ms
TZST-0001FASTVT: 2
TZST-0001FASTVT: 4
TZST-0001SLOWVT: 3
TZST-0001SLOWVT: 5
TZST-0003FASTVT: 36 J
TZST-0003SLOWVT: 25 J
TZST-0003SLOWVT: 36 J

## 2011-11-20 MED ORDER — LORAZEPAM 1 MG PO TABS
1.0000 mg | ORAL_TABLET | Freq: Once | ORAL | Status: AC
  Administered 2011-11-20: 1 mg via ORAL
  Filled 2011-11-20: qty 1

## 2011-11-20 NOTE — Progress Notes (Signed)
ICD interrogation

## 2011-11-20 NOTE — Telephone Encounter (Signed)
Spoke with pt and scheduled for pt to come in for device check. Pt will be here sometime this morning.

## 2011-11-20 NOTE — ED Notes (Signed)
Sudden onset shortness of breath - denies chest pain- reports "a sensation over her left upper chest"

## 2011-11-20 NOTE — Telephone Encounter (Signed)
New  Problem:    Patient called in because she went to the hospital last night because she felt a tingling sensation from her ICD location and a panic attack following that and wanted to come in and have it evaluated to she if it is still functioning.  Please call back.

## 2011-11-20 NOTE — Telephone Encounter (Signed)
Spoke with Krisitn in device clinic and she will call patient and have her came in for ICD check

## 2011-11-20 NOTE — ED Provider Notes (Signed)
Medical screening examination/treatment/procedure(s) were performed by non-physician practitioner and as supervising physician I was immediately available for consultation/collaboration.   Nat Christen, MD 11/20/11 [phone]

## 2011-11-20 NOTE — ED Provider Notes (Signed)
History     CSN: [id number]  Arrival date & time 11/20/11  0147   First MD Initiated Contact with Patient 11/20/11 [phone]      Chief Complaint  Patient presents with  . Pacemaker Problem  . Shortness of Breath    HPI: The history is provided by the patient.  She reports that approximately midnight tonight her pacemaker/defibrillator emitted a "strange sensation". States that she's never felt this before. Patient denies that it felt like a shock, but is otherwise unable to articulate well the sensation. Denies chest pain,  shortness of breath, palpitations, nausea or other symptoms before during or after this episode. Denies recent illness. States that she was told by her cardiologist that the battery in her pacer/defibrillator is low, and they have increased her assessments to every 2 weeks from every 3 months to closely monitor the battery. Patient states after this episode she became very anxious and cautious and began to  have a panic attack. States she was unable to go to bed as she was afraid there may be something wrong with her defibrillator.  Past Medical History  Diagnosis Date  . Atrial fibrillation   . Cancer   . CHF (congestive heart failure)   . HTN (hypertension)   . Stroke     Past Surgical History  Procedure Date  . Pacemaker insertion   . Mastectomy   . Oophorectomy   . Transthoracic echocardiogram 08/14/06    Family History  Problem Relation Age of Onset  . Coronary artery disease Other   . Hypertension      History  Substance Use Topics  . Smoking status: Never Smoker   . Smokeless tobacco: Not on file  . Alcohol Use: No    OB History    Grav Para Term Preterm Abortions TAB SAB Ect Mult Living                  Review of Systems  Constitutional: Negative.   HENT: Negative.   Eyes: Negative.   Respiratory: Negative.   Cardiovascular: Negative.   Gastrointestinal: Negative.   Genitourinary: Negative.   Musculoskeletal: Negative.   Skin:  Negative.   Neurological: Negative.   Hematological: Negative.   Psychiatric/Behavioral: Negative.     Allergies  Codeine and Penicillins  Home Medications   Current Outpatient Rx  Name Route Sig Dispense Refill  . CALTRATE 600+D PLUS PO Oral Take 1 tablet by mouth daily.     [name] Kitchen CARVEDILOL 6.25 MG PO TABS Oral Take 6.25 mg by mouth 2 (two) times daily with a meal. 1- 1/2 tablets    . OMEGA-3 FATTY ACIDS 1000 MG PO CAPS Oral Take 2 g by mouth daily.      . FUROSEMIDE 40 MG PO TABS Oral Take 40 mg by mouth as needed.      [name] Kitchen LOSARTAN POTASSIUM 50 MG PO TABS Oral Take 50 mg by mouth daily.      [name] Kitchen POTASSIUM CHLORIDE 20 MEQ PO PACK Oral Take 20 mEq by mouth as needed.      [name] Kitchen SPIRONOLACTONE 25 MG PO TABS Oral Take 25 mg by mouth daily.      [name] Kitchen TEMAZEPAM 15 MG PO CAPS Oral Take 15 mg by mouth at bedtime as needed.      . WARFARIN SODIUM 4 MG PO TABS Oral Take 4 mg by mouth daily. 6mg  on monday Wednesday and Friday and 4 mg       BP 162/69  Pulse 79  Temp(Src) 98.9 F (37.2 C) (Oral)  Resp 18  SpO2 99%  Physical Exam  Constitutional: She is oriented to person, place, and time. She appears well-developed and well-nourished.  HENT:  Head: Normocephalic and atraumatic.  Eyes: Conjunctivae are normal.  Neck: Neck supple.  Cardiovascular: Normal rate and regular rhythm.   Pulmonary/Chest: Effort normal and breath sounds normal.  Abdominal: Soft. Bowel sounds are normal.  Musculoskeletal: Normal range of motion.  Neurological: She is alert and oriented to person, place, and time.  Skin: Skin is warm and dry. Rash noted. Rash is papular. No erythema.  Psychiatric: She has a normal mood and affect.    ED Course  Procedures   Date: 11/20/2011  Rate: 79  Rhythm: A-V dual paced complexes w/ some inhibition  QRS Axis: normal  Intervals: normal  ST/T Wave abnormalities: normal  Conduction Disutrbances:none  Narrative Interpretation: Previous EKG showed electronic ventricular  pacemaker  Old EKG Reviewed: changes noted  3:15: Spoke w/ L. Annie Paras, FNP-C w/ [facility] who states will have pt f/u in office in am for further eval of pt's pacer/ICD. No other tests requested. Will have office staff call pt first thing in the morning to provide appointment time. Discussed plan w/ Dr Golda Acre who is in agreement w/ plan.  0350:  Pt feels much better since Po Ativan. Discussed d/c plan w/ pt who is agreeable.  Labs Reviewed - No data to display No results found.   No diagnosis found.    MDM  Based on HPI/PE and clinical findings, doubt ICD elicited electrical shock. Pt denies CP before during or after "sensation".  Anxiety improved w/ PO Ativan       Roma Kayser Holmes Hays, NP 11/20/11 [phone]

## 2011-11-23 ENCOUNTER — Encounter: Payer: Self-pay | Admitting: Internal Medicine

## 2011-11-23 ENCOUNTER — Ambulatory Visit (INDEPENDENT_AMBULATORY_CARE_PROVIDER_SITE_OTHER): Payer: Medicare Other | Admitting: Internal Medicine

## 2011-11-23 VITALS — BP 126/56 | HR 80 | Ht 63.0 in | Wt 179.8 lb

## 2011-11-23 DIAGNOSIS — I4891 Unspecified atrial fibrillation: Secondary | ICD-10-CM

## 2011-11-23 DIAGNOSIS — Z95810 Presence of automatic (implantable) cardiac defibrillator: Secondary | ICD-10-CM

## 2011-11-23 DIAGNOSIS — I509 Heart failure, unspecified: Secondary | ICD-10-CM

## 2011-11-23 NOTE — Assessment & Plan Note (Signed)
Review of her device interrogation demonstrates that she has maintained sinus rhythm. She will continue her current medical therapy.

## 2011-11-23 NOTE — Assessment & Plan Note (Signed)
The patient's symptoms are well controlled. She'll continue her current medical therapy and maintain a low-sodium diet.

## 2011-11-23 NOTE — Progress Notes (Signed)
HPI Danielle Mckinney returns today for followup. She is a very pleasant middle-aged woman with a nonischemic cardiomyopathy, left bundle branch block, chronic systolic heart failure, status post bi-ventricular ICD implantation. The patient has reached elective replacement indication on her device. She has minimal class II heart failure symptoms. She denies peripheral edema. She has had no ICD shock. Allergies  Allergen Reactions  . Codeine   . Penicillins      Current Outpatient Prescriptions  Medication Sig Dispense Refill  . Calcium Carbonate-Vit D-Min (CALTRATE 600+D PLUS PO) Take 1 tablet by mouth daily.       . carvedilol (COREG) 6.25 MG tablet Take 6.25 mg by mouth 2 (two) times daily with a meal. 1- 1/2 tablets      . fish oil-omega-3 fatty acids 1000 MG capsule Take 2 g by mouth daily.        . furosemide (LASIX) 40 MG tablet Take 40 mg by mouth as needed.        [name] Kitchen losartan (COZAAR) 50 MG tablet Take 50 mg by mouth daily.        . potassium chloride (KLOR-CON) 20 MEQ packet Take 20 mEq by mouth as needed.        [name] Kitchen spironolactone (ALDACTONE) 25 MG tablet Take 25 mg by mouth daily.        . temazepam (RESTORIL) 15 MG capsule Take 15 mg by mouth at bedtime as needed.        . warfarin (COUMADIN) 4 MG tablet Take 4 mg by mouth daily. 6mg  on monday Wednesday and Friday and 4 mg          Past Medical History  Diagnosis Date  . Atrial fibrillation   . Cancer   . CHF (congestive heart failure)   . HTN (hypertension)   . Stroke     ROS:   All systems reviewed and negative except as noted in the HPI.   Past Surgical History  Procedure Date  . Pacemaker insertion   . Mastectomy   . Oophorectomy   . Transthoracic echocardiogram 08/14/06     Family History  Problem Relation Age of Onset  . Coronary artery disease Other   . Hypertension       History   Social History  . Marital Status: Divorced    Spouse Name: N/A    Number of Children: N/A  . Years of Education: N/A    Occupational History  . Not on file.   Social History Main Topics  . Smoking status: Never Smoker   . Smokeless tobacco: Not on file  . Alcohol Use: No  . Drug Use: No  . Sexually Active: Not on file   Other Topics Concern  . Not on file   Social History Narrative  . No narrative on file     BP 126/56  Pulse 80  Ht 5\' 3"  (1.6 m)  Wt 81.557 kg (179 lb 12.8 oz)  BMI 31.85 kg/m2  Physical Exam:  Well appearing middle-aged woman, NAD HEENT: Unremarkable Neck:  No JVD, no thyromegally Lungs:  Clear with no wheezes, rales, or rhonchi. HEART:  Regular rate rhythm, no murmurs, no rubs, no clicks Abd:  soft, positive bowel sounds, no organomegally, no rebound, no guarding Ext:  2 plus pulses, no edema, no cyanosis, no clubbing Skin:  No rashes no nodules Neuro:  CN II through XII intact, motor grossly intact  DEVICE  Normal device function.  See PaceArt for details. Device is at [facility].  Assess/Plan:

## 2011-11-23 NOTE — Assessment & Plan Note (Signed)
Her device has reached elective replacement indication. We will schedule ICD generator removal and insertion of a new device.

## 2011-12-01 ENCOUNTER — Telehealth: Payer: Self-pay | Admitting: Internal Medicine

## 2011-12-01 DIAGNOSIS — I509 Heart failure, unspecified: Secondary | ICD-10-CM

## 2011-12-01 DIAGNOSIS — I4891 Unspecified atrial fibrillation: Secondary | ICD-10-CM

## 2011-12-01 NOTE — Telephone Encounter (Signed)
New problem Pt is calling about getting new battery in defibrillator. Please call

## 2011-12-01 NOTE — Telephone Encounter (Signed)
Wants to change the procedure date to Thurs  I informed her Dr Ladona Ridgel is not in the hospital then  She is going to check with her daughter and I will call her back on Monday

## 2011-12-04 NOTE — Telephone Encounter (Signed)
lmom for pt to call me back tomorrow and have me overhead paged

## 2011-12-04 NOTE — Telephone Encounter (Signed)
FU call: Pt calling wanting to speak with nurse about rs surgery pt has on 12/22/11. Please return pt call to discuss further.

## 2011-12-05 ENCOUNTER — Encounter (HOSPITAL_COMMUNITY): Payer: Self-pay | Admitting: Pharmacy Technician

## 2011-12-05 NOTE — Telephone Encounter (Signed)
Changed her procedure to 12/20/11 Be at the hospital at 6:30 for an 8:30 procedure

## 2011-12-07 ENCOUNTER — Encounter: Payer: Medicare Other | Admitting: *Deleted

## 2011-12-11 NOTE — Telephone Encounter (Signed)
FU Call: pt wanted to change date for blood work for surgery it is schedule for 2/15 and pt wanted to rs it to 2/12 or 2/14 between 12pm-3pm. Please return pt call to discuss further.

## 2011-12-11 NOTE — Telephone Encounter (Signed)
She will come at 1:00pm on Thurs for labs  Appt made

## 2011-12-13 ENCOUNTER — Encounter: Payer: Self-pay | Admitting: *Deleted

## 2011-12-14 ENCOUNTER — Telehealth: Payer: Self-pay | Admitting: Internal Medicine

## 2011-12-14 ENCOUNTER — Other Ambulatory Visit: Payer: Medicare Other | Admitting: *Deleted

## 2011-12-14 ENCOUNTER — Other Ambulatory Visit (INDEPENDENT_AMBULATORY_CARE_PROVIDER_SITE_OTHER): Payer: Medicare Other | Admitting: *Deleted

## 2011-12-14 DIAGNOSIS — I509 Heart failure, unspecified: Secondary | ICD-10-CM

## 2011-12-14 DIAGNOSIS — I4891 Unspecified atrial fibrillation: Secondary | ICD-10-CM

## 2011-12-14 LAB — CBC WITH DIFFERENTIAL/PLATELET
Basophils Absolute: 0 10*3/uL (ref 0.0–0.1)
Eosinophils Absolute: 0.1 10*3/uL (ref 0.0–0.7)
Lymphocytes Relative: 39.5 % (ref 12.0–46.0)
MCHC: 33.5 g/dL (ref 30.0–36.0)
MCV: 80.7 fl (ref 78.0–100.0)
Monocytes Absolute: 0.5 10*3/uL (ref 0.1–1.0)
Neutro Abs: 1.9 10*3/uL (ref 1.4–7.7)
Neutrophils Relative %: 45.2 % (ref 43.0–77.0)
RDW: 15.9 % — ABNORMAL HIGH (ref 11.5–14.6)

## 2011-12-14 LAB — BASIC METABOLIC PANEL
Chloride: 100 mEq/L (ref 96–112)
Creatinine, Ser: 0.7 mg/dL (ref 0.4–1.2)
GFR: 106.74 mL/min (ref >60.00)

## 2011-12-14 LAB — PROTIME-INR: Prothrombin Time: 22.6 s — ABNORMAL HIGH (ref 10.2–12.4)

## 2011-12-14 NOTE — Telephone Encounter (Signed)
New Msg: Pt calling wanting to speak with nurse regarding pt wanting to know if she needs to stop taking coumadin prior to pt procedure. Please return pt call to discuss further.

## 2011-12-14 NOTE — Telephone Encounter (Signed)
INR--2.0 today  No need to stop Coumadin prior to procedure.  lmom for patient.

## 2011-12-15 ENCOUNTER — Other Ambulatory Visit: Payer: Medicare Other | Admitting: *Deleted

## 2011-12-18 ENCOUNTER — Telehealth: Payer: Self-pay | Admitting: Internal Medicine

## 2011-12-18 NOTE — Telephone Encounter (Signed)
New msg Pt wants some more info about surgery

## 2011-12-18 NOTE — Telephone Encounter (Signed)
lmom for pt to return my call. 

## 2011-12-19 MED FILL — Chlorhexidine Gluconate Liquid 4%: CUTANEOUS | Qty: 60 | Status: AC

## 2011-12-19 MED FILL — Vancomycin HCl-Dextrose IV Soln 1 GM/200ML-5%: INTRAVENOUS | Qty: 200 | Status: AC

## 2011-12-19 MED FILL — Gentamicin Sulfate Inj 40 MG/ML: INTRAMUSCULAR | Qty: 2 | Status: AC

## 2011-12-19 NOTE — Telephone Encounter (Signed)
Spoke with pt, questions answered.

## 2011-12-19 NOTE — Telephone Encounter (Signed)
Pt rtn call to [facility] from yesterday and pt needs to know where her surgery will be tomorrow

## 2011-12-20 ENCOUNTER — Ambulatory Visit (HOSPITAL_COMMUNITY)
Admission: RE | Admit: 2011-12-20 | Discharge: 2011-12-20 | Disposition: A | Discharge status: Home / Self Care | Payer: Medicare Other | Source: Ambulatory Visit | Attending: Internal Medicine | Admitting: Internal Medicine

## 2011-12-20 ENCOUNTER — Encounter (HOSPITAL_COMMUNITY)
Admission: RE | Disposition: A | Discharge status: Home / Self Care | Payer: Self-pay | Source: Ambulatory Visit | Attending: Internal Medicine

## 2011-12-20 DIAGNOSIS — I5022 Chronic systolic (congestive) heart failure: Secondary | ICD-10-CM | POA: Insufficient documentation

## 2011-12-20 DIAGNOSIS — I447 Left bundle-branch block, unspecified: Secondary | ICD-10-CM | POA: Insufficient documentation

## 2011-12-20 DIAGNOSIS — I428 Other cardiomyopathies: Secondary | ICD-10-CM | POA: Insufficient documentation

## 2011-12-20 DIAGNOSIS — I10 Essential (primary) hypertension: Secondary | ICD-10-CM | POA: Insufficient documentation

## 2011-12-20 DIAGNOSIS — Z8673 Personal history of transient ischemic attack (TIA), and cerebral infarction without residual deficits: Secondary | ICD-10-CM | POA: Insufficient documentation

## 2011-12-20 DIAGNOSIS — Z4502 Encounter for adjustment and management of automatic implantable cardiac defibrillator: Secondary | ICD-10-CM | POA: Insufficient documentation

## 2011-12-20 DIAGNOSIS — I509 Heart failure, unspecified: Secondary | ICD-10-CM | POA: Insufficient documentation

## 2011-12-20 HISTORY — PX: BIV ICD GENERTAOR CHANGE OUT: SHX5745

## 2011-12-20 SURGERY — BIV ICD GENERTAOR CHANGE OUT
Anesthesia: LOCAL

## 2011-12-20 MED ORDER — ACETAMINOPHEN 325 MG PO TABS
ORAL_TABLET | ORAL | Status: AC
  Filled 2011-12-20: qty 2

## 2011-12-20 MED ORDER — FENTANYL CITRATE 0.05 MG/ML IJ SOLN
INTRAMUSCULAR | Status: AC
  Filled 2011-12-20: qty 2

## 2011-12-20 MED ORDER — LIDOCAINE HCL (PF) 1 % IJ SOLN
INTRAMUSCULAR | Status: AC
  Filled 2011-12-20: qty 30

## 2011-12-20 MED ORDER — ONDANSETRON HCL 4 MG/2ML IJ SOLN: 4.0000 mg | Freq: Four times a day (QID) | INTRAMUSCULAR | Status: DC | PRN

## 2011-12-20 MED ORDER — FUROSEMIDE 40 MG PO TABS: 40.0000 mg | ORAL_TABLET | Freq: Every day | ORAL | Status: DC | PRN

## 2011-12-20 MED ORDER — SODIUM CHLORIDE 0.45 % IV SOLN: INTRAVENOUS | Status: AC

## 2011-12-20 MED ORDER — LOSARTAN POTASSIUM 50 MG PO TABS: 50.0000 mg | ORAL_TABLET | Freq: Every day | ORAL | Status: DC

## 2011-12-20 MED ORDER — SPIRONOLACTONE 25 MG PO TABS: 25.0000 mg | ORAL_TABLET | Freq: Every day | ORAL | Status: DC

## 2011-12-20 MED ORDER — MUPIROCIN 2 % EX OINT
TOPICAL_OINTMENT | CUTANEOUS | Status: AC
  Administered 2011-12-20: 1 via NASAL
  Filled 2011-12-20: qty 22

## 2011-12-20 MED ORDER — MIDAZOLAM HCL 5 MG/5ML IJ SOLN
INTRAMUSCULAR | Status: AC
  Filled 2011-12-20: qty 5

## 2011-12-20 MED ORDER — ACETAMINOPHEN 325 MG PO TABS
325.0000 mg | ORAL_TABLET | ORAL | Status: DC | PRN
  Administered 2011-12-20: 650 mg via ORAL

## 2011-12-20 MED ORDER — CARVEDILOL 6.25 MG PO TABS: 9.3750 mg | ORAL_TABLET | Freq: Two times a day (BID) | ORAL | Status: DC

## 2011-12-20 MED ADMIN — Sodium Chloride IV Soln 0.9%: INTRAVENOUS | @ 08:00:00 | NDC 00338004904

## 2011-12-20 NOTE — Op Note (Signed)
NAME:  Danielle Mckinney, Danielle Mckinney NO.:  [id number]  MEDICAL RECORD NO.:  [id number]  LOCATION:  [facility]                         FACILITY:  [facility]  PHYSICIAN:  Doylene Canning. Ladona Ridgel, MD    DATE OF BIRTH:  05-28-1951  DATE OF PROCEDURE:  12/20/2011 DATE OF DISCHARGE:                              OPERATIVE REPORT   PROCEDURE PERFORMED:  Removal of previously implanted biventricular ICD, which had reached elective replacement, followed by insertion of a new biventricular ICD with defibrillation threshold testing.  INTRODUCTION:  The patient is a 61 year old woman with a nonischemic cardiomyopathy, left bundle-branch block, and class 3 congestive heart failure.  She underwent Bi-V ICD insertion several years ago.  She has reached elective replacement on her current device and is now referred for removal of her old device and insertion of a new one.  PROCEDURE:  After informed consent was obtained, the patient was taken to the Diagnostic EP Lab in a fasting state.  After usual preparation and draping, intravenous fentanyl and midazolam was given for sedation. Lidocaine 30 mL was infiltrated into the left infraclavicular region.  A 7-cm incision was carried out just caudal to the old ICD insertion site and electrocautery was utilized to dissect down to the ICD pocket.  The generator was removed with gentle traction.  The leads were disconnected from the old defibrillator and evaluated and found be working satisfactorily.  The P-waves were 5.  The R-waves were 11.  The impedance was 340 in the atrium, 450 in the right ventricle, and 790 in the left ventricle.  Thresholds were all less than 1 volt at 0.5 msec. Impedances were satisfactory.  At this point, the new St. Jude Unify Assura Bi-V ICD, serial number [id number] was connected to the old atrial RV and LV leads, and placed back in the subcutaneous pocket.  The pocket was irrigated with antibiotic irrigation.  The incision was closed  with 2-0 and 3-0 Vicryl.  Benzoin and Steri-Strips painted on the skin, and the patient was more deeply sedated for defibrillation threshold testing.  At this point, I scrubbed out of the case to supervise defibrillation threshold testing.  With the patient appropriately sedated with fentanyl and Versed, VF was induced with a T-wave shock and a 15-joule shock was subsequent delivered, which terminated ventricular fibrillation and restored sinus rhythm.  At this point, no additional defibrillation threshold testing was carried out, and the patient was allowed to awaken and returned to her room in satisfactory condition.  COMPLICATIONS:  There were no immediate procedure complications.  RESULTS:  This demonstrates successful removal of her previously implanted St. Jude Bi-V ICD, which had reached elective replacement and insertion of a new device without immediate procedure complications.     Doylene Canning. Ladona Ridgel, MD     GWT/MEDQ  D:  12/20/2011  T:  12/20/2011  Job:  [id number]

## 2011-12-20 NOTE — H&P (View-Only) (Signed)
HPI Danielle Mckinney returns today for followup. She is a very pleasant middle-aged woman with a nonischemic cardiomyopathy, left bundle branch block, chronic systolic heart failure, status post bi-ventricular ICD implantation. The patient has reached elective replacement indication on her device. She has minimal class II heart failure symptoms. She denies peripheral edema. She has had no ICD shock. Allergies  Allergen Reactions  . Codeine   . Penicillins      Current Outpatient Prescriptions  Medication Sig Dispense Refill  . Calcium Carbonate-Vit D-Min (CALTRATE 600+D PLUS PO) Take 1 tablet by mouth daily.       . carvedilol (COREG) 6.25 MG tablet Take 6.25 mg by mouth 2 (two) times daily with a meal. 1- 1/2 tablets      . fish oil-omega-3 fatty acids 1000 MG capsule Take 2 g by mouth daily.        . furosemide (LASIX) 40 MG tablet Take 40 mg by mouth as needed.        . losartan (COZAAR) 50 MG tablet Take 50 mg by mouth daily.        . potassium chloride (KLOR-CON) 20 MEQ packet Take 20 mEq by mouth as needed.        . spironolactone (ALDACTONE) 25 MG tablet Take 25 mg by mouth daily.        . temazepam (RESTORIL) 15 MG capsule Take 15 mg by mouth at bedtime as needed.        . warfarin (COUMADIN) 4 MG tablet Take 4 mg by mouth daily. 6mg on monday Wednesday and Friday and 4 mg          Past Medical History  Diagnosis Date  . Atrial fibrillation   . Cancer   . CHF (congestive heart failure)   . HTN (hypertension)   . Stroke     ROS:   All systems reviewed and negative except as noted in the HPI.   Past Surgical History  Procedure Date  . Pacemaker insertion   . Mastectomy   . Oophorectomy   . Transthoracic echocardiogram 08/14/06     Family History  Problem Relation Age of Onset  . Coronary artery disease Other   . Hypertension       History   Social History  . Marital Status: Divorced    Spouse Name: N/A    Number of Children: N/A  . Years of Education: N/A    Occupational History  . Not on file.   Social History Main Topics  . Smoking status: Never Smoker   . Smokeless tobacco: Not on file  . Alcohol Use: No  . Drug Use: No  . Sexually Active: Not on file   Other Topics Concern  . Not on file   Social History Narrative  . No narrative on file     BP 126/56  Pulse 80  Ht 5' 3" (1.6 m)  Wt 81.557 kg (179 lb 12.8 oz)  BMI 31.85 kg/m2  Physical Exam:  Well appearing middle-aged woman, NAD HEENT: Unremarkable Neck:  No JVD, no thyromegally Lungs:  Clear with no wheezes, rales, or rhonchi. HEART:  Regular rate rhythm, no murmurs, no rubs, no clicks Abd:  soft, positive bowel sounds, no organomegally, no rebound, no guarding Ext:  2 plus pulses, no edema, no cyanosis, no clubbing Skin:  No rashes no nodules Neuro:  CN II through XII intact, motor grossly intact  DEVICE  Normal device function.  See PaceArt for details. Device is at [facility].    Assess/Plan:   

## 2011-12-20 NOTE — Interval H&P Note (Signed)
History and Physical Interval Note:  12/20/2011 7:20 AM  Danielle Mckinney  has presented today for surgery, with the diagnosis of icd eol  The various methods of treatment have been discussed with the patient and family. After consideration of risks, benefits and other options for treatment, the patient has consented to  Procedure(s) (LRB): BIV ICD GENERTAOR CHANGE OUT (N/A) as a surgical intervention .  The patients' history has been reviewed, patient examined, no change in status, stable for surgery.  I have reviewed the patients' chart and labs.  Questions were answered to the patient's satisfaction.     Lewayne Bunting

## 2011-12-20 NOTE — Op Note (Signed)
ICD removal and insertion of a new device performed without immediate complication.B#[id number].

## 2011-12-20 NOTE — Progress Notes (Signed)
STATES READY TO GO HOME; TAKING PO'S WITHOUT DIFFICULTY; UP TO BATHROOM AND TOLERATED WELL; DAUGHTER WITH CLIENT

## 2012-01-01 ENCOUNTER — Ambulatory Visit (INDEPENDENT_AMBULATORY_CARE_PROVIDER_SITE_OTHER): Payer: Medicare Other | Admitting: *Deleted

## 2012-01-01 ENCOUNTER — Encounter: Payer: Self-pay | Admitting: Internal Medicine

## 2012-01-01 DIAGNOSIS — I428 Other cardiomyopathies: Secondary | ICD-10-CM

## 2012-01-01 LAB — ICD DEVICE OBSERVATION
AL THRESHOLD: 0.5 V
ATRIAL PACING ICD: 0.51 pct
BAMS-0001: 180 {beats}/min
DEV-0020ICD: NEGATIVE
DEVICE MODEL ICD: 7027976
FVT: 0
LV LEAD THRESHOLD: 0.75 V
MODE SWITCH EPISODES: 0
PACEART VT: 0
RV LEAD AMPLITUDE: 11.3 mv
TZAT-0001FASTVT: 1
TZAT-0004FASTVT: 8
TZAT-0018SLOWVT: NEGATIVE
TZAT-0019FASTVT: 7.5 V
TZAT-0019SLOWVT: 7.5 V
TZAT-0020SLOWVT: 1 ms
TZON-0003SLOWVT: 340 ms
TZON-0004FASTVT: 24
TZON-0004SLOWVT: 40
TZON-0005SLOWVT: 6
TZON-0010FASTVT: 40 ms
TZON-0010SLOWVT: 40 ms
TZST-0001FASTVT: 3
TZST-0001FASTVT: 4
TZST-0001SLOWVT: 2
TZST-0003FASTVT: 25 J
TZST-0003FASTVT: 40 J
TZST-0003FASTVT: 40 J
TZST-0003SLOWVT: 30 J
TZST-0003SLOWVT: 40 J
VENTRICULAR PACING ICD: 99.86 pct

## 2012-01-01 NOTE — Progress Notes (Signed)
Wound check-ICD 

## 2012-01-17 ENCOUNTER — Telehealth: Payer: Self-pay | Admitting: Internal Medicine

## 2012-01-17 NOTE — Telephone Encounter (Signed)
New Msg: Pt calling wanting to speak with device clinic with question regarding pt transmitter. Please return pt call to discuss further.

## 2012-01-23 NOTE — Telephone Encounter (Signed)
Instructions given to patient to set up [employer].   Patient understands and is in agreement.

## 2012-03-06 ENCOUNTER — Encounter: Payer: Medicare Other | Admitting: Obstetrics and Gynecology

## 2012-03-20 ENCOUNTER — Encounter: Payer: Self-pay | Admitting: Gynecology

## 2012-03-20 DIAGNOSIS — N870 Mild cervical dysplasia: Secondary | ICD-10-CM | POA: Insufficient documentation

## 2012-03-20 DIAGNOSIS — C801 Malignant (primary) neoplasm, unspecified: Secondary | ICD-10-CM | POA: Insufficient documentation

## 2012-03-28 ENCOUNTER — Encounter: Payer: Medicare Other | Admitting: Obstetrics and Gynecology

## 2012-04-10 ENCOUNTER — Ambulatory Visit (INDEPENDENT_AMBULATORY_CARE_PROVIDER_SITE_OTHER): Payer: Medicare Other | Admitting: Obstetrics and Gynecology

## 2012-04-10 ENCOUNTER — Encounter: Payer: Self-pay | Admitting: Obstetrics and Gynecology

## 2012-04-10 VITALS — BP 124/74 | Ht 62.0 in | Wt 182.0 lb

## 2012-04-10 DIAGNOSIS — N870 Mild cervical dysplasia: Secondary | ICD-10-CM

## 2012-04-10 DIAGNOSIS — C50919 Malignant neoplasm of unspecified site of unspecified female breast: Secondary | ICD-10-CM

## 2012-04-10 DIAGNOSIS — R635 Abnormal weight gain: Secondary | ICD-10-CM

## 2012-04-10 DIAGNOSIS — R102 Pelvic and perineal pain: Secondary | ICD-10-CM

## 2012-04-10 DIAGNOSIS — N949 Unspecified condition associated with female genital organs and menstrual cycle: Secondary | ICD-10-CM

## 2012-04-10 DIAGNOSIS — N952 Postmenopausal atrophic vaginitis: Secondary | ICD-10-CM

## 2012-04-10 DIAGNOSIS — Z780 Asymptomatic menopausal state: Secondary | ICD-10-CM

## 2012-04-10 DIAGNOSIS — D219 Benign neoplasm of connective and other soft tissue, unspecified: Secondary | ICD-10-CM | POA: Insufficient documentation

## 2012-04-10 NOTE — Progress Notes (Signed)
Patient came back to see me today for further followup. One of her problems as lower abdominal pain. She says it can be very severe and sharp. It occurs  about every 2 weeks. Is not associated with nausea, vomiting, change in bowel habits. Nothing really makes it better or worse. She attributes to her fibroids but only last scanned her 2 years ago they were very small. 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 She has breast cancer but no longer sees the oncologist and wanted me to check her CA 27.29. She is having trouble with weight gain in spite of extensive exercise and dieting. She is having a lot of hot flashes but thinks she can deal  with them. She also has some vaginal dryness. We did a cone on her in 2010 because of cervical dysplasia and she has had normal Paps since then. She no longer has mammograms because she's had bilateral mastectomies. She's had bone density through her PCP which are normal.  ROS: 12 system review done. Pertinent positives above. Other positives include hypertension, atrial fibrillation with anticoagulation, and the implantable defibrillator.  Physical examination: Kennon Portela present. HEENT within normal limits. Neck: Thyroid not large. No masses. Supraclavicular nodes: not enlarged. Breasts: Examined in both sitting and lying  position. She is status post bilateral mastectomy with TRAM. No masses palpated. Abdomen: Soft no guarding rebound or masses or hernia. Pelvic: External: Within normal limits. BUS: Within normal limits. Vaginal:within normal limits. poor estrogen effect. No evidence of cystocele rectocele or enterocele. Cervix: clean. Uterus: Normal size and shape. Adnexa: No masses. Rectovaginal exam: Confirmatory and negative. Extremities: Within normal limits.  Assessment: #1. Pelvic pain #2. Cervical dysplasia #3. Menopausal symptoms #4. Atrophic vaginitis #5. Weight gain #6. Breast cancer  Plan: Appropriate lab drawn. Pelvic ultrasound  scheduled.

## 2012-04-11 LAB — CANCER ANTIGEN 27.29: CA 27.29: 29 U/mL (ref 0–39)

## 2012-04-11 LAB — TSH: TSH: 2.336 u[IU]/mL (ref 0.350–4.500)

## 2012-04-18 ENCOUNTER — Ambulatory Visit (INDEPENDENT_AMBULATORY_CARE_PROVIDER_SITE_OTHER): Payer: Medicare Other

## 2012-04-18 ENCOUNTER — Ambulatory Visit (INDEPENDENT_AMBULATORY_CARE_PROVIDER_SITE_OTHER): Payer: Medicare Other | Admitting: Obstetrics and Gynecology

## 2012-04-18 ENCOUNTER — Encounter: Payer: Self-pay | Admitting: Obstetrics and Gynecology

## 2012-04-18 DIAGNOSIS — N949 Unspecified condition associated with female genital organs and menstrual cycle: Secondary | ICD-10-CM

## 2012-04-18 DIAGNOSIS — R339 Retention of urine, unspecified: Secondary | ICD-10-CM

## 2012-04-18 DIAGNOSIS — Z853 Personal history of malignant neoplasm of breast: Secondary | ICD-10-CM

## 2012-04-18 DIAGNOSIS — R102 Pelvic and perineal pain: Secondary | ICD-10-CM

## 2012-04-18 DIAGNOSIS — D259 Leiomyoma of uterus, unspecified: Secondary | ICD-10-CM

## 2012-04-18 DIAGNOSIS — D251 Intramural leiomyoma of uterus: Secondary | ICD-10-CM

## 2012-04-18 DIAGNOSIS — D219 Benign neoplasm of connective and other soft tissue, unspecified: Secondary | ICD-10-CM

## 2012-04-18 NOTE — Progress Notes (Signed)
Patient came back to see me today because of persistent pain that appears to be localized suprapubically. She gets it about every 2 weeks. It can be sharp or dull. There are no significant GI or GU symptoms. On ultrasound today she has an anteverted uterus with 3 small fibroids of 5, 9 and 8 mm. These are slightly smaller then when I scanned her in  2011. She has had both her ovaries removed and she has no adnexal masses. We noted that on a post void scan she does have residual urine.  Assessment: Suprapubic pain, small fibroids  Plan: Patient reassured. I told her I did not think the fibroids are causing her pain. We discussed pelvic adhesive disease. I've asked to go see her PCP discussed non GYN causes of abdominal pain. If that does not help we discussed urological referral and she will call me.

## 2012-04-23 ENCOUNTER — Ambulatory Visit (INDEPENDENT_AMBULATORY_CARE_PROVIDER_SITE_OTHER): Payer: Medicare Other | Admitting: Internal Medicine

## 2012-04-23 ENCOUNTER — Encounter: Payer: Self-pay | Admitting: Internal Medicine

## 2012-04-23 VITALS — BP 133/70 | Resp 18 | Ht 63.0 in | Wt 182.4 lb

## 2012-04-23 DIAGNOSIS — I5032 Chronic diastolic (congestive) heart failure: Secondary | ICD-10-CM

## 2012-04-23 DIAGNOSIS — I509 Heart failure, unspecified: Secondary | ICD-10-CM

## 2012-04-23 DIAGNOSIS — Z95810 Presence of automatic (implantable) cardiac defibrillator: Secondary | ICD-10-CM

## 2012-04-23 DIAGNOSIS — I5022 Chronic systolic (congestive) heart failure: Secondary | ICD-10-CM

## 2012-04-23 HISTORY — DX: Chronic diastolic (congestive) heart failure: I50.32

## 2012-04-23 LAB — ICD DEVICE OBSERVATION
AL AMPLITUDE: 5 mv
ATRIAL PACING ICD: 1.3 pct
BAMS-0003: 70 {beats}/min
DEVICE MODEL ICD: 7027976
HV IMPEDENCE: 43 Ohm
LV LEAD IMPEDENCE ICD: 730 Ohm
LV LEAD THRESHOLD: 0.75 V
RV LEAD IMPEDENCE ICD: 390 Ohm
RV LEAD THRESHOLD: 0.625 V
TZAT-0001SLOWVT: 1
TZAT-0012FASTVT: 200 ms
TZAT-0012SLOWVT: 200 ms
TZAT-0013FASTVT: 1
TZAT-0013SLOWVT: 4
TZAT-0018FASTVT: NEGATIVE
TZAT-0020SLOWVT: 1 ms
TZON-0003FASTVT: 300 ms
TZON-0005SLOWVT: 6
TZST-0001FASTVT: 2
TZST-0001FASTVT: 3
TZST-0001FASTVT: 5
TZST-0001SLOWVT: 4
TZST-0001SLOWVT: 5
TZST-0003FASTVT: 36 J
TZST-0003FASTVT: 40 J
TZST-0003FASTVT: 40 J
TZST-0003SLOWVT: 15 J
TZST-0003SLOWVT: 30 J

## 2012-04-23 NOTE — Patient Instructions (Addendum)
Your physician wants you to follow-up in: 07/22/12 You will receive a reminder letter in the mail two months in advance. If you don't receive a letter, please call our office to schedule the follow-up appointment.  Your physician wants you to follow-up in: 12/2012 with Dr Court Joy will receive a reminder letter in the mail two months in advance. If you don't receive a letter, please call our office to schedule the follow-up appointment.

## 2012-04-23 NOTE — Assessment & Plan Note (Signed)
Her symptoms are well controlled. Will continue her current meds and maintain a low sodium diet.

## 2012-04-23 NOTE — Progress Notes (Signed)
HPI Danielle Mckinney returns today for followup. She is a pleasant 61 yo woman with an NICM, s/p ICD implant. She is s/p ICD implant with recent generator change. She has had minimal incisional pain. No chest pain or sob. No ICD shocks. Allergies  Allergen Reactions  . Codeine Nausea And Vomiting  . Lisinopril Cough  . Penicillins Nausea And Vomiting     Current Outpatient Prescriptions  Medication Sig Dispense Refill  . albuterol (PROVENTIL HFA;VENTOLIN HFA) 108 (90 BASE) MCG/ACT inhaler Inhale 2 puffs into the lungs every 6 (six) hours as needed. Shortness of breath      . Calcium Carbonate-Vit D-Min (CALTRATE 600+D PLUS PO) Take 1 tablet by mouth daily.       . carvedilol (COREG) 6.25 MG tablet Take 9.375 mg by mouth 2 (two) times daily with a meal.       . fish oil-omega-3 fatty acids 1000 MG capsule Take 2 g by mouth daily.        . furosemide (LASIX) 40 MG tablet Take 40 mg by mouth daily as needed. For swelling       . losartan (COZAAR) 50 MG tablet Take 50 mg by mouth daily.        . potassium chloride SA (K-DUR,KLOR-CON) 20 MEQ tablet Take 20 mEq by mouth daily as needed. Takes along with Furosemide when needed.      [name] Kitchen spironolactone (ALDACTONE) 25 MG tablet Take 25 mg by mouth daily.        . temazepam (RESTORIL) 15 MG capsule Take 15 mg by mouth at bedtime as needed. For sleep      . warfarin (COUMADIN) 4 MG tablet Take 4-6 mg by mouth. Takes 1.5 tablet (6mg ) on Monday, Wednesday &  Fridays  & 1 tablet (4mg ) all the other days of the week.         Past Medical History  Diagnosis Date  . Atrial fibrillation   . CHF (congestive heart failure)   . HTN (hypertension)   . Stroke   . CIN I (cervical intraepithelial neoplasia I)   . Cancer     Breast cancer-BRACA I gene  . Fibroid     ROS:   All systems reviewed and negative except as noted in the HPI.   Past Surgical History  Procedure Date  . Pacemaker insertion   . Mastectomy   . Transthoracic echocardiogram 08/14/06  .  Gynecologic cryosurgery   . Colposcopy   . Tubal ligation   . Breast surgery     Bilateral mastctomy and reconstruction TRAM  . Cervical cone biopsy   . Cardiac defibrillator placement   . Oophorectomy     BSO     Family History  Problem Relation Age of Onset  . Coronary artery disease Other   . Breast cancer Sister     Age 65  . Hypertension Sister   . Hypertension Brother   . Heart disease Maternal Grandfather      History   Social History  . Marital Status: Divorced    Spouse Name: N/A    Number of Children: N/A  . Years of Education: N/A   Occupational History  . Not on file.   Social History Main Topics  . Smoking status: Never Smoker   . Smokeless tobacco: Not on file  . Alcohol Use: No  . Drug Use: No  . Sexually Active: Yes    Birth Control/ Protection: Surgical   Other Topics Concern  . Not on file  Social History Narrative  . No narrative on file     BP 133/70  Resp 18  Ht 5\' 3"  (1.6 m)  Wt 182 lb 6.4 oz (82.736 kg)  BMI 32.31 kg/m2  Physical Exam:  Well appearing middle aged woman, NAD HEENT: Unremarkable Neck:  7 cm JVD, no thyromegally Lungs:  Clear with no wheezes, rales or rhonchi. Well healed ICD HEART:  Regular rate rhythm, no murmurs, no rubs, no clicks Abd:  soft, positive bowel sounds, no organomegally, no rebound, no guarding Ext:  2 plus pulses, no edema, no cyanosis, no clubbing Skin:  No rashes no nodules Neuro:  CN II through XII intact, motor grossly intact  DEVICE  Normal device function.  See PaceArt for details.    Assess/Plan:

## 2012-04-23 NOTE — Assessment & Plan Note (Signed)
Her device is working normally. 

## 2012-04-24 ENCOUNTER — Telehealth: Payer: Self-pay | Admitting: Internal Medicine

## 2012-04-24 NOTE — Telephone Encounter (Signed)
Pt has questions re new device

## 2012-04-24 NOTE — Telephone Encounter (Signed)
error 

## 2012-04-24 NOTE — Telephone Encounter (Signed)
Spoke w/pt in regards to transmitter.

## 2012-06-25 HISTORY — PX: US ECHOCARDIOGRAPHY: HXRAD669

## 2012-07-22 ENCOUNTER — Ambulatory Visit (INDEPENDENT_AMBULATORY_CARE_PROVIDER_SITE_OTHER): Payer: Medicare Other | Admitting: *Deleted

## 2012-07-22 DIAGNOSIS — I5022 Chronic systolic (congestive) heart failure: Secondary | ICD-10-CM

## 2012-07-22 DIAGNOSIS — Z95810 Presence of automatic (implantable) cardiac defibrillator: Secondary | ICD-10-CM

## 2012-07-22 DIAGNOSIS — I509 Heart failure, unspecified: Secondary | ICD-10-CM

## 2012-07-25 ENCOUNTER — Encounter: Payer: Self-pay | Admitting: *Deleted

## 2012-07-25 LAB — REMOTE ICD DEVICE
AL AMPLITUDE: 5 mv
AL THRESHOLD: 0.5 V
ATRIAL PACING ICD: 3.1 pct
BAMS-0001: 180 {beats}/min
DEV-0020ICD: NEGATIVE
RV LEAD THRESHOLD: 0.625 V
TZAT-0001FASTVT: 1
TZAT-0013SLOWVT: 4
TZAT-0018SLOWVT: NEGATIVE
TZAT-0020SLOWVT: 1 ms
TZON-0005SLOWVT: 6
TZON-0010FASTVT: 40 ms
TZST-0001FASTVT: 2
TZST-0001FASTVT: 5
TZST-0001SLOWVT: 2
TZST-0001SLOWVT: 4
TZST-0003FASTVT: 25 J
TZST-0003FASTVT: 40 J
TZST-0003SLOWVT: 30 J

## 2012-08-07 ENCOUNTER — Encounter: Payer: Self-pay | Admitting: *Deleted

## 2012-08-23 ENCOUNTER — Encounter: Payer: Self-pay | Admitting: Internal Medicine

## 2012-10-28 ENCOUNTER — Encounter: Payer: Medicare Other | Admitting: *Deleted

## 2012-10-31 ENCOUNTER — Encounter: Payer: Self-pay | Admitting: *Deleted

## 2012-11-15 ENCOUNTER — Telehealth: Payer: Self-pay | Admitting: Internal Medicine

## 2012-11-15 NOTE — Telephone Encounter (Signed)
Pt calling re letter re transmission, she thought device checked itself, pls call [phone]

## 2012-11-15 NOTE — Telephone Encounter (Signed)
LMOM for pt to return call/kwm  

## 2012-11-18 NOTE — Telephone Encounter (Signed)
LMOM--[id number]/kwm

## 2012-11-19 ENCOUNTER — Ambulatory Visit (INDEPENDENT_AMBULATORY_CARE_PROVIDER_SITE_OTHER): Payer: Medicare Other | Admitting: *Deleted

## 2012-11-19 ENCOUNTER — Encounter: Payer: Self-pay | Admitting: Internal Medicine

## 2012-11-19 DIAGNOSIS — I428 Other cardiomyopathies: Secondary | ICD-10-CM

## 2012-11-19 DIAGNOSIS — I509 Heart failure, unspecified: Secondary | ICD-10-CM

## 2012-11-19 DIAGNOSIS — I5022 Chronic systolic (congestive) heart failure: Secondary | ICD-10-CM

## 2012-11-19 DIAGNOSIS — Z95810 Presence of automatic (implantable) cardiac defibrillator: Secondary | ICD-10-CM

## 2012-11-19 NOTE — Telephone Encounter (Signed)
Spoke w/pt in regards to transmission. Pt to send manual transmission. Will call to make sure transmission received.

## 2012-11-20 LAB — REMOTE ICD DEVICE
AL AMPLITUDE: 5 mv
ATRIAL PACING ICD: 3.8 pct
DEV-0020ICD: NEGATIVE
DEVICE MODEL ICD: 7027976
HV IMPEDENCE: 47 Ohm
RV LEAD IMPEDENCE ICD: 400 Ohm
TZAT-0004FASTVT: 8
TZAT-0004SLOWVT: 8
TZAT-0012SLOWVT: 200 ms
TZAT-0013FASTVT: 1
TZAT-0013SLOWVT: 4
TZAT-0018FASTVT: NEGATIVE
TZAT-0018SLOWVT: NEGATIVE
TZAT-0019FASTVT: 7.5 V
TZON-0003FASTVT: 300 ms
TZON-0003SLOWVT: 340 ms
TZON-0004FASTVT: 24
TZON-0010FASTVT: 40 ms
TZST-0001FASTVT: 2
TZST-0001FASTVT: 3
TZST-0001FASTVT: 4
TZST-0001SLOWVT: 3
TZST-0001SLOWVT: 5
TZST-0003FASTVT: 36 J
TZST-0003FASTVT: 40 J
TZST-0003SLOWVT: 30 J

## 2012-11-28 ENCOUNTER — Encounter: Payer: Self-pay | Admitting: *Deleted

## 2013-01-15 ENCOUNTER — Ambulatory Visit: Payer: Self-pay | Admitting: Cardiovascular Disease

## 2013-01-15 DIAGNOSIS — I4891 Unspecified atrial fibrillation: Secondary | ICD-10-CM

## 2013-01-15 DIAGNOSIS — Z7901 Long term (current) use of anticoagulants: Secondary | ICD-10-CM | POA: Insufficient documentation

## 2013-01-15 DIAGNOSIS — I639 Cerebral infarction, unspecified: Secondary | ICD-10-CM

## 2013-01-15 HISTORY — DX: Cerebral infarction, unspecified: I63.9

## 2013-01-15 HISTORY — DX: Long term (current) use of anticoagulants: Z79.01

## 2013-02-17 ENCOUNTER — Ambulatory Visit (INDEPENDENT_AMBULATORY_CARE_PROVIDER_SITE_OTHER): Payer: Medicare Other | Admitting: *Deleted

## 2013-02-17 DIAGNOSIS — I5022 Chronic systolic (congestive) heart failure: Secondary | ICD-10-CM

## 2013-02-17 DIAGNOSIS — Z95810 Presence of automatic (implantable) cardiac defibrillator: Secondary | ICD-10-CM

## 2013-02-18 ENCOUNTER — Other Ambulatory Visit: Payer: Self-pay | Admitting: Internal Medicine

## 2013-02-18 DIAGNOSIS — Z95810 Presence of automatic (implantable) cardiac defibrillator: Secondary | ICD-10-CM

## 2013-02-18 DIAGNOSIS — I5022 Chronic systolic (congestive) heart failure: Secondary | ICD-10-CM

## 2013-02-18 DIAGNOSIS — I509 Heart failure, unspecified: Secondary | ICD-10-CM

## 2013-02-18 LAB — REMOTE ICD DEVICE
AL AMPLITUDE: 5 mv
AL IMPEDENCE ICD: 310 Ohm
BAMS-0001: 180 {beats}/min
BAMS-0003: 70 {beats}/min
HV IMPEDENCE: 44 Ohm
RV LEAD AMPLITUDE: 11.3 mv
RV LEAD IMPEDENCE ICD: 400 Ohm
RV LEAD THRESHOLD: 0.625 V
TZAT-0001FASTVT: 1
TZAT-0001SLOWVT: 1
TZAT-0004FASTVT: 8
TZAT-0004SLOWVT: 8
TZAT-0012FASTVT: 200 ms
TZAT-0018SLOWVT: NEGATIVE
TZAT-0019FASTVT: 7.5 V
TZON-0003FASTVT: 300 ms
TZON-0003SLOWVT: 340 ms
TZON-0004FASTVT: 24
TZON-0010FASTVT: 40 ms
TZON-0010SLOWVT: 40 ms
TZST-0001FASTVT: 3
TZST-0001FASTVT: 5
TZST-0001SLOWVT: 2
TZST-0001SLOWVT: 5
TZST-0003FASTVT: 25 J
TZST-0003FASTVT: 40 J
TZST-0003SLOWVT: 15 J
TZST-0003SLOWVT: 40 J

## 2013-02-25 ENCOUNTER — Encounter: Payer: Self-pay | Admitting: *Deleted

## 2013-03-24 ENCOUNTER — Other Ambulatory Visit: Payer: Self-pay | Admitting: Cardiovascular Disease

## 2013-04-02 ENCOUNTER — Ambulatory Visit: Payer: Medicare Other | Admitting: Pharmacist Clinician (PhC)/ Clinical Pharmacy Specialist

## 2013-04-03 ENCOUNTER — Ambulatory Visit (INDEPENDENT_AMBULATORY_CARE_PROVIDER_SITE_OTHER): Payer: Medicare Other | Admitting: Pharmacist Clinician (PhC)/ Clinical Pharmacy Specialist

## 2013-04-03 VITALS — BP 108/70 | HR 80

## 2013-04-03 DIAGNOSIS — I6350 Cerebral infarction due to unspecified occlusion or stenosis of unspecified cerebral artery: Secondary | ICD-10-CM

## 2013-04-03 DIAGNOSIS — Z7901 Long term (current) use of anticoagulants: Secondary | ICD-10-CM

## 2013-04-03 DIAGNOSIS — I4891 Unspecified atrial fibrillation: Secondary | ICD-10-CM

## 2013-04-03 DIAGNOSIS — I639 Cerebral infarction, unspecified: Secondary | ICD-10-CM

## 2013-04-03 LAB — POCT INR: INR: 1.4

## 2013-04-15 ENCOUNTER — Encounter: Payer: Self-pay | Admitting: Gynecology

## 2013-04-15 ENCOUNTER — Ambulatory Visit (INDEPENDENT_AMBULATORY_CARE_PROVIDER_SITE_OTHER): Payer: Medicare Other | Admitting: Gynecology

## 2013-04-15 VITALS — BP 138/80 | Ht 61.5 in | Wt 179.0 lb

## 2013-04-15 DIAGNOSIS — D259 Leiomyoma of uterus, unspecified: Secondary | ICD-10-CM

## 2013-04-15 DIAGNOSIS — R109 Unspecified abdominal pain: Secondary | ICD-10-CM

## 2013-04-15 DIAGNOSIS — Z780 Asymptomatic menopausal state: Secondary | ICD-10-CM

## 2013-04-15 HISTORY — DX: Leiomyoma of uterus, unspecified: D25.9

## 2013-04-15 NOTE — Progress Notes (Addendum)
Danielle Mckinney [place] Jan 20, 1951 [id number]   History:    62 y.o.  For GYN exam as well as to discuss several complaints. She has been complaining on and off for quite some time every 2-3 weeks lower abdominal discomfort. She had a normal colonoscopy in 2010. Patient denies any nausea vomiting or change in bowel movement habits or urination. Patient with known small fibroids in her uterus for several years it appears that on previous scans it continues to decrease in size. Patient has had bilateral salpingo-oophorectomy in the past as a result of her being BRCA1 positive. 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 Subsequent Pap smears have been normal patient has not received the shingles vaccine yet.  The patient has history of atrial fibrillation, congestive heart failure, and has history of stroke. Patient has a defibrillator implant in place. Patient is currently on Coumadin.  Patient's last ultrasound in 2013 demonstrated uterus to measure 6.4 x 4.6 x 2.1 cm with endometrial stripe of 2 mm. Patient had 3 small fibroids the largest one measuring 9 x 10 mm. Right and left adnexa no abnormalities seen.  Past medical history,surgical history, family history and social history were all reviewed and documented in the EPIC chart.  Gynecologic History No LMP recorded. Patient is postmenopausal. Contraception: post menopausal status Last Pap: 2012. Results were: normal Last mammogram: see above. Results were: see above  Obstetric History OB History   Grav Para Term Preterm Abortions TAB SAB Ect Mult Living   3 2 2  1     2      # Outc Date GA Lbr Len/2nd Wgt Sex Del Anes PTL Lv   1 TRM            2 TRM            3 ABT                ROS: A ROS was  performed and pertinent positives and negatives are included in the history.  GENERAL: No fevers or chills. HEENT: No change in vision, no earache, sore throat or sinus congestion. NECK: No pain or stiffness. CARDIOVASCULAR: No chest pain or pressure. No palpitations. PULMONARY: No shortness of breath, cough or wheeze. GASTROINTESTINAL: No abdominal pain, nausea, vomiting or diarrhea, melena or bright red blood per rectum. GENITOURINARY: No urinary frequency, urgency, hesitancy or dysuria. MUSCULOSKELETAL: No joint or muscle pain, no back pain, no recent trauma. DERMATOLOGIC: No rash, no itching, no lesions. ENDOCRINE: No polyuria, polydipsia, no heat or cold intolerance. No recent change in weight. HEMATOLOGICAL: No anemia or easy bruising or bleeding. NEUROLOGIC: No headache, seizures, numbness, tingling or weakness. PSYCHIATRIC: No depression, no loss of interest in normal activity or change in sleep pattern.     Exam: chaperone present  BP 138/80  Ht 5' 1.5" (1.562 m)  Wt 179 lb (81.194 kg)  BMI 33.28 kg/m2  Body mass index is 33.28 kg/(m^2).  General appearance : Well developed well nourished female. No acute distress HEENT: Neck supple, trachea midline, no carotid bruits, no thyroidmegaly Lungs: Clear to auscultation, no rhonchi or wheezes, or rib retractions  Heart: Regular rate and rhythm, no murmurs or gallops Breast:Examined in sitting and supine position were symmetrical in appearance, no palpable masses or tenderness,  no skin  retraction, no nipple inversion, no nipple discharge, no skin discoloration, no axillary or supraclavicular lymphadenopathy Abdomen: no palpable masses or tenderness, no rebound or guarding Extremities: no edema or skin discoloration or tenderness  Pelvic:  Bartholin, Urethra, Skene Glands: Within normal limits             Vagina: No gross lesions or discharge  Cervix: No gross lesions or discharge  Uterus  anteverted, normal size, shape and consistency,  non-tender and mobile  Adnexa  Without masses or tenderness  Anus and perineum  normal   Rectovaginal  normal sphincter tone without palpated masses or tenderness             Hemoccult test done today results pending     Assessment/Plan:  62 y.o. female who for many years complaining of lower abdominal discomfort that occurs every 3 weeks. She reports no change in bowel movement habits or in urination. My previous partner had done bilateral salpingo-oophorectomy on her as a result of her rest cancer history in the BRCA1 positive. It should this her discomfort at times been attributed to adhesions or her small fibroids. Although the ultrasounds and his note indicates that through the years they have continued to gotten smaller. Patient reports no vaginal bleeding. Patient will be scheduled for an ultrasound here in a few weeks in the office. She was given a prescription for shingles vaccine. She was provided with a requisition for a scheduled bone density study.patient's primary physician is Dr. Clyde Canterbury who has been doing patient's lab work    Ok Edwards MD, 5:24 PM 04/15/2013

## 2013-04-15 NOTE — Patient Instructions (Signed)
Shingles Vaccine What You Need to Know WHAT IS SHINGLES?  Shingles is a painful skin rash, often with blisters. It is also called Herpes Zoster or just Zoster.  A shingles rash usually appears on one side of the face or body and lasts from 2 to 4 weeks. Its main symptom is pain, which can be quite severe. Other symptoms of shingles can include fever, headache, chills, and upset stomach. Very rarely, a shingles infection can lead to pneumonia, hearing problems, blindness, brain inflammation (encephalitis), or death.  For about 1 person in 5, severe pain can continue even after the rash clears up. This is called post-herpetic neuralgia.  Shingles is caused by the Varicella Zoster virus. This is the same virus that causes chickenpox. Only someone who has had a case of chickenpox or rarely, has gotten chickenpox vaccine, can get shingles. The virus stays in your body. It can reappear many years later to cause a case of shingles.  You cannot catch shingles from another person with shingles. However, a person who has never had chickenpox (or chickenpox vaccine) could get chickenpox from someone with shingles. This is not very common.  Shingles is far more common in people 50 and older than in younger people. It is also more common in people whose immune systems are weakened because of a disease such as cancer or drugs such as steroids or chemotherapy.  At least 1 million people get shingles per year in the [place]. SHINGLES VACCINE  A vaccine for shingles was licensed in 2006. In clinical trials, the vaccine reduced the risk of shingles by 50%. It can also reduce the pain in people who still get shingles after being vaccinated.  A single dose of shingles vaccine is recommended for adults 60 years of age and older. SOME PEOPLE SHOULD NOT GET SHINGLES VACCINE OR SHOULD WAIT A person should not get shingles vaccine if he or she:  Has ever had a life-threatening allergic reaction to gelatin, the  antibiotic neomycin, or any other component of shingles vaccine. Tell your caregiver if you have any severe allergies.  Has a weakened immune system because of current:  AIDS or another disease that affects the immune system.  Treatment with drugs that affect the immune system, such as prolonged use of high-dose steroids.  Cancer treatment, such as radiation or chemotherapy.  Cancer affecting the bone marrow or lymphatic system, such as leukemia or lymphoma.  Is pregnant, or might be pregnant. Women should not become pregnant until at least 4 weeks after getting shingles vaccine. Someone with a minor illness, such as a cold, may be vaccinated. Anyone with a moderate or severe acute illness should usually wait until he or she recovers before getting the vaccine. This includes anyone with a temperature of 101.3 F (38 C) or higher. WHAT ARE THE RISKS FROM SHINGLES VACCINE?  A vaccine, like any medicine, could possibly cause serious problems, such as severe allergic reactions. However, the risk of a vaccine causing serious harm, or death, is extremely small.  No serious problems have been identified with shingles vaccine. Mild Problems  Redness, soreness, swelling, or itching at the site of the injection (about 1 person in 3).  Headache (about 1 person in 70). Like all vaccines, shingles vaccine is being closely monitored for unusual or severe problems. WHAT IF THERE IS A MODERATE OR SEVERE REACTION? What should I look for? Any unusual condition, such as a severe allergic reaction or a high fever. If a severe allergic reaction   occurred, it would be within a few minutes to an hour after the shot. Signs of a serious allergic reaction can include difficulty breathing, weakness, hoarseness or wheezing, a fast heartbeat, hives, dizziness, paleness, or swelling of the throat. What should I do?  Call your caregiver, or get the person to a caregiver right away.  Tell the caregiver what  happened, the date and time it happened, and when the vaccination was given.  Ask the caregiver to report the reaction by filing a Vaccine Adverse Event Reporting System (VAERS) form. Or, you can file this report through the VAERS web site at www.vaers.hhs.gov or by calling 1-800-822-7967. VAERS does not provide medical advice. HOW CAN I LEARN MORE?  Ask your caregiver. He or she can give you the vaccine package insert or suggest other sources of information.  Contact the Centers for Disease Control and Prevention (CDC):  Call 1-800-232-4636 (1-800-CDC-INFO).  Visit the CDC website at www.cdc.gov/vaccines CDC Shingles Vaccine VIS (08/04/08) Document Released: 08/13/2006 Document Revised: 01/08/2012 Document Reviewed: 08/04/2008 Exit[employer] Patient Information 2014 [employer].  

## 2013-04-22 ENCOUNTER — Encounter: Payer: Self-pay | Admitting: Obstetrics and Gynecology

## 2013-04-24 ENCOUNTER — Ambulatory Visit (INDEPENDENT_AMBULATORY_CARE_PROVIDER_SITE_OTHER): Payer: Medicare Other | Admitting: Pharmacist Clinician (PhC)/ Clinical Pharmacy Specialist

## 2013-04-24 ENCOUNTER — Encounter: Payer: Self-pay | Admitting: Gynecology

## 2013-04-24 VITALS — BP 130/78 | HR 72

## 2013-04-24 DIAGNOSIS — I6350 Cerebral infarction due to unspecified occlusion or stenosis of unspecified cerebral artery: Secondary | ICD-10-CM

## 2013-04-24 DIAGNOSIS — Z7901 Long term (current) use of anticoagulants: Secondary | ICD-10-CM

## 2013-04-24 DIAGNOSIS — I4891 Unspecified atrial fibrillation: Secondary | ICD-10-CM

## 2013-04-24 DIAGNOSIS — I639 Cerebral infarction, unspecified: Secondary | ICD-10-CM

## 2013-04-24 LAB — POCT INR: INR: 2.6

## 2013-05-05 ENCOUNTER — Other Ambulatory Visit: Payer: Self-pay | Admitting: *Deleted

## 2013-05-05 ENCOUNTER — Other Ambulatory Visit: Payer: Self-pay | Admitting: Pharmacist Clinician (PhC)/ Clinical Pharmacy Specialist

## 2013-05-05 MED ORDER — WARFARIN SODIUM 4 MG PO TABS: ORAL_TABLET | ORAL | Status: DC

## 2013-05-12 ENCOUNTER — Ambulatory Visit (HOSPITAL_COMMUNITY)
Admission: RE | Admit: 2013-05-12 | Discharge: 2013-05-12 | Disposition: A | Discharge status: Home / Self Care | Payer: Medicare Other | Source: Ambulatory Visit | Attending: Gynecology | Admitting: Gynecology

## 2013-05-12 ENCOUNTER — Ambulatory Visit (INDEPENDENT_AMBULATORY_CARE_PROVIDER_SITE_OTHER): Payer: Medicare Other | Admitting: Gynecology

## 2013-05-12 ENCOUNTER — Ambulatory Visit (INDEPENDENT_AMBULATORY_CARE_PROVIDER_SITE_OTHER): Payer: Medicare Other

## 2013-05-12 ENCOUNTER — Telehealth: Payer: Self-pay | Admitting: *Deleted

## 2013-05-12 DIAGNOSIS — D259 Leiomyoma of uterus, unspecified: Secondary | ICD-10-CM

## 2013-05-12 DIAGNOSIS — M79609 Pain in unspecified limb: Secondary | ICD-10-CM | POA: Insufficient documentation

## 2013-05-12 DIAGNOSIS — G8929 Other chronic pain: Secondary | ICD-10-CM

## 2013-05-12 DIAGNOSIS — N949 Unspecified condition associated with female genital organs and menstrual cycle: Secondary | ICD-10-CM

## 2013-05-12 DIAGNOSIS — D251 Intramural leiomyoma of uterus: Secondary | ICD-10-CM

## 2013-05-12 DIAGNOSIS — D252 Subserosal leiomyoma of uterus: Secondary | ICD-10-CM

## 2013-05-12 DIAGNOSIS — R109 Unspecified abdominal pain: Secondary | ICD-10-CM

## 2013-05-12 DIAGNOSIS — M79604 Pain in right leg: Secondary | ICD-10-CM

## 2013-05-12 DIAGNOSIS — Z780 Asymptomatic menopausal state: Secondary | ICD-10-CM

## 2013-05-12 DIAGNOSIS — M79661 Pain in right lower leg: Secondary | ICD-10-CM

## 2013-05-12 NOTE — Telephone Encounter (Signed)
Appt. for doppler study at 3:30 pm at [facility] pt informed, order placed.

## 2013-05-12 NOTE — Progress Notes (Signed)
*  PRELIMINARY RESULTS* Vascular Ultrasound Right lower extremity venous duplex has been completed.  Preliminary findings: Right = negative for DVT and baker's cyst.  Attempted call report with no answer. Left message with results.    Farrel Demark, RDMS, RVT  05/12/2013, 3:53 PM

## 2013-05-12 NOTE — Progress Notes (Signed)
The patient was seen in the office on June 17 for her annual gynecological examination. She has been complaining on and off for quite some time every 2-3 weeks lower abdominal discomfort. She had a normal colonoscopy in 2010. Patient denies any nausea vomiting or change in bowel movement habits or urination. Patient with known small fibroids in her uterus for several years it appears that on previous scans it continues to decrease in size. Patient has had bilateral salpingo-oophorectomy in the past as a result of her being BRCA1 positive. 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 Subsequent Pap smears have been normal  She presented today for a followup ultrasound. Patient had informed me that she came back from a long but strep and had been complaining of some slight right calf tenderness for the past few days.  Exam: Alternatives: Both calves were symmetrical in appearance. Homans sign was negative bilateral. Popliteal, medial malleolus and dorsalis pedis pulses were equal. There was no temperature difference on either leg. She did have some slight tenderness on the lower right lateral half upon direct pressure.  The patient has history of atrial fibrillation, congestive heart failure, and has history of stroke. Patient has a defibrillator implant in place. Patient is currently on Coumadin.  Ultrasound today: Uterus measures 6.5 x 4.6 x 2.5 cm with endometrial stripe of 1.7 mm. Right and left adnexa no apparent masses were seen. Small intramural fibroid: 10 mm, 8 mm, and 12 x 10 mm.  Assessment/plan: Patient's and frequent low abdominal discomfort may be attributed to pelvic adhesions or IBS. Patient will be sent to the vascular lab at [facility] for duplex of the right lower extremity to rule out DVT.

## 2013-05-19 ENCOUNTER — Ambulatory Visit (INDEPENDENT_AMBULATORY_CARE_PROVIDER_SITE_OTHER): Payer: Medicare Other | Admitting: Pharmacist Clinician (PhC)/ Clinical Pharmacy Specialist

## 2013-05-19 VITALS — BP 118/66 | HR 80

## 2013-05-19 DIAGNOSIS — I639 Cerebral infarction, unspecified: Secondary | ICD-10-CM

## 2013-05-19 DIAGNOSIS — I4891 Unspecified atrial fibrillation: Secondary | ICD-10-CM

## 2013-05-19 DIAGNOSIS — Z7901 Long term (current) use of anticoagulants: Secondary | ICD-10-CM

## 2013-05-19 DIAGNOSIS — I6350 Cerebral infarction due to unspecified occlusion or stenosis of unspecified cerebral artery: Secondary | ICD-10-CM

## 2013-05-30 ENCOUNTER — Ambulatory Visit (INDEPENDENT_AMBULATORY_CARE_PROVIDER_SITE_OTHER): Payer: Medicare Other | Admitting: Internal Medicine

## 2013-05-30 ENCOUNTER — Encounter: Payer: Self-pay | Admitting: Internal Medicine

## 2013-05-30 VITALS — BP 143/77 | HR 80 | Ht 62.0 in | Wt 180.0 lb

## 2013-05-30 DIAGNOSIS — Z95810 Presence of automatic (implantable) cardiac defibrillator: Secondary | ICD-10-CM

## 2013-05-30 DIAGNOSIS — I4891 Unspecified atrial fibrillation: Secondary | ICD-10-CM

## 2013-05-30 DIAGNOSIS — I509 Heart failure, unspecified: Secondary | ICD-10-CM

## 2013-05-30 DIAGNOSIS — I5022 Chronic systolic (congestive) heart failure: Secondary | ICD-10-CM

## 2013-05-30 NOTE — Patient Instructions (Addendum)

## 2013-05-30 NOTE — Progress Notes (Signed)
HPI Danielle Mckinney returns today for followup. She is a pleasant 62 yo woman with an non-ischemic CM, S/P ICD implant, chronic systolic CHF, HTN, and chronic coumadin therapy. In the interim she has done well. She denies chest pain, shortness of breath, peripheral edema, syncope, or any ICD shocks. She remains active. No recent fevers or chills. Allergies  Allergen Reactions  . Codeine Nausea And Vomiting  . Lisinopril Cough  . Penicillins Nausea And Vomiting     Current Outpatient Prescriptions  Medication Sig Dispense Refill  . albuterol (PROVENTIL HFA;VENTOLIN HFA) 108 (90 BASE) MCG/ACT inhaler Inhale 2 puffs into the lungs every 6 (six) hours as needed. Shortness of breath      . Calcium Carbonate-Vit D-Min (CALTRATE 600+D PLUS PO) Take 1 tablet by mouth daily.       . carvedilol (COREG) 6.25 MG tablet Take 9.375 mg by mouth 2 (two) times daily with a meal.       . fish oil-omega-3 fatty acids 1000 MG capsule Take 2 g by mouth daily.        . furosemide (LASIX) 40 MG tablet Take 40 mg by mouth daily as needed. For swelling       . losartan (COZAAR) 100 MG tablet take 1 tablet by mouth once daily  30 tablet  6  . potassium chloride SA (K-DUR,KLOR-CON) 20 MEQ tablet Take 20 mEq by mouth daily as needed. Takes along with Furosemide when needed.      [name] Kitchen spironolactone (ALDACTONE) 25 MG tablet Take 25 mg by mouth daily.        . temazepam (RESTORIL) 15 MG capsule Take 15 mg by mouth at bedtime as needed. For sleep      . warfarin (COUMADIN) 4 MG tablet Take 1-1.5 tablets by mouth daily as directed by coumadin clinic  45 tablet  6   No current facility-administered medications for this visit.     Past Medical History  Diagnosis Date  . Atrial fibrillation   . CHF (congestive heart failure)   . HTN (hypertension)   . Stroke   . CIN I (cervical intraepithelial neoplasia I)   . Cancer     Breast cancer-BRACA I gene  . Fibroid     ROS:   All systems reviewed and negative except as noted  in the HPI.   Past Surgical History  Procedure Laterality Date  . Pacemaker insertion    . Mastectomy    . Transthoracic echocardiogram  08/14/06  . Gynecologic cryosurgery    . Colposcopy    . Tubal ligation    . Breast surgery      Bilateral mastctomy and reconstruction TRAM  . Cervical cone biopsy    . Cardiac defibrillator placement    . Oophorectomy      BSO     Family History  Problem Relation Age of Onset  . Coronary artery disease Other   . Breast cancer Sister     Age 35  . Hypertension Sister   . Hypertension Brother   . Heart disease Maternal Grandfather      History   Social History  . Marital Status: Divorced    Spouse Name: N/A    Number of Children: N/A  . Years of Education: N/A   Occupational History  . Not on file.   Social History Main Topics  . Smoking status: Never Smoker   . Smokeless tobacco: Not on file  . Alcohol Use: No  . Drug Use: No  .  Sexually Active: Yes    Birth Control/ Protection: Surgical   Other Topics Concern  . Not on file   Social History Narrative  . No narrative on file     BP 143/77  Pulse 80  Ht 5\' 2"  (1.575 m)  Wt 180 lb (81.647 kg)  BMI 32.91 kg/m2  Physical Exam:  Well appearing middle-aged woman, NAD HEENT: Unremarkable Neck:  6 cm JVD, no thyromegally Back:  No CVA tenderness Lungs:  Clear with no wheezes, rales, or rhonchi. HEART:  Regular rate rhythm, no murmurs, no rubs, no clicks Abd:  soft, positive bowel sounds, no organomegally, no rebound, no guarding Ext:  2 plus pulses, no edema, no cyanosis, no clubbing Skin:  No rashes no nodules Neuro:  CN II through XII intact, motor grossly intact  EKG normal sinus rhythm with ventricular pacing  DEVICE  Normal device function.  See PaceArt for details.   Assess/Plan:

## 2013-05-30 NOTE — Assessment & Plan Note (Signed)
She is currently maintaining sinus rhythm. She will continue systemic anticoagulation.

## 2013-05-30 NOTE — Assessment & Plan Note (Signed)
Her chronic systolic heart failure symptoms are currently class II a period she'll continue her current medical therapy, and maintain a low-sodium diet.

## 2013-05-30 NOTE — Assessment & Plan Note (Signed)
Her [employer] Jude biventricular ICD is working normally. We'll plan to recheck in several months. 

## 2013-06-03 LAB — ICD DEVICE OBSERVATION
AL AMPLITUDE: 5 mv
DEVICE MODEL ICD: 7027976
FVT: 0
LV LEAD IMPEDENCE ICD: 762.5 Ohm
RV LEAD AMPLITUDE: 11.3 mv
RV LEAD IMPEDENCE ICD: 400 Ohm
TOT-0008: 0
TOT-0009: 1
TZAT-0001SLOWVT: 1
TZAT-0004FASTVT: 8
TZAT-0012FASTVT: 200 ms
TZAT-0012SLOWVT: 200 ms
TZAT-0013FASTVT: 1
TZAT-0019SLOWVT: 7.5 V
TZAT-0020FASTVT: 1 ms
TZAT-0020SLOWVT: 1 ms
TZON-0003FASTVT: 300 ms
TZON-0004FASTVT: 30
TZON-0004SLOWVT: 40
TZON-0005FASTVT: 6
TZON-0005SLOWVT: 6
TZON-0010FASTVT: 40 ms
TZON-0010SLOWVT: 40 ms
TZST-0001FASTVT: 5
TZST-0001SLOWVT: 4
TZST-0003FASTVT: 25 J
TZST-0003FASTVT: 36 J
TZST-0003FASTVT: 40 J
TZST-0003SLOWVT: 15 J
TZST-0003SLOWVT: 40 J
VF: 0

## 2013-06-04 ENCOUNTER — Encounter: Payer: Self-pay | Admitting: Internal Medicine

## 2013-06-06 ENCOUNTER — Encounter: Payer: Self-pay | Admitting: *Deleted

## 2013-06-09 ENCOUNTER — Ambulatory Visit (INDEPENDENT_AMBULATORY_CARE_PROVIDER_SITE_OTHER): Payer: Medicare Other | Admitting: Cardiovascular Disease

## 2013-06-09 ENCOUNTER — Encounter: Payer: Self-pay | Admitting: Cardiovascular Disease

## 2013-06-09 ENCOUNTER — Ambulatory Visit (INDEPENDENT_AMBULATORY_CARE_PROVIDER_SITE_OTHER): Payer: Medicare Other | Admitting: Pharmacist Clinician (PhC)/ Clinical Pharmacy Specialist

## 2013-06-09 ENCOUNTER — Ambulatory Visit: Payer: Medicare Other | Admitting: Pharmacist Clinician (PhC)/ Clinical Pharmacy Specialist

## 2013-06-09 VITALS — BP 138/70 | HR 68

## 2013-06-09 VITALS — BP 133/77 | HR 61 | Resp 16 | Ht 62.5 in | Wt 180.7 lb

## 2013-06-09 DIAGNOSIS — I4891 Unspecified atrial fibrillation: Secondary | ICD-10-CM

## 2013-06-09 DIAGNOSIS — I10 Essential (primary) hypertension: Secondary | ICD-10-CM

## 2013-06-09 DIAGNOSIS — I639 Cerebral infarction, unspecified: Secondary | ICD-10-CM

## 2013-06-09 DIAGNOSIS — I6350 Cerebral infarction due to unspecified occlusion or stenosis of unspecified cerebral artery: Secondary | ICD-10-CM

## 2013-06-09 DIAGNOSIS — I509 Heart failure, unspecified: Secondary | ICD-10-CM

## 2013-06-09 DIAGNOSIS — Z95810 Presence of automatic (implantable) cardiac defibrillator: Secondary | ICD-10-CM

## 2013-06-09 DIAGNOSIS — Z7901 Long term (current) use of anticoagulants: Secondary | ICD-10-CM

## 2013-06-09 DIAGNOSIS — I5042 Chronic combined systolic (congestive) and diastolic (congestive) heart failure: Secondary | ICD-10-CM

## 2013-06-09 DIAGNOSIS — I5022 Chronic systolic (congestive) heart failure: Secondary | ICD-10-CM

## 2013-06-09 LAB — POCT INR: INR: 2.1

## 2013-06-09 MED ORDER — POTASSIUM CHLORIDE CRYS ER 20 MEQ PO TBCR: 20.0000 meq | EXTENDED_RELEASE_TABLET | Freq: Every day | ORAL | Status: DC | PRN

## 2013-06-09 MED ORDER — FUROSEMIDE 40 MG PO TABS: 40.0000 mg | ORAL_TABLET | Freq: Every day | ORAL | Status: DC | PRN

## 2013-06-09 MED ORDER — PANTOPRAZOLE SODIUM 40 MG PO TBEC: 40.0000 mg | DELAYED_RELEASE_TABLET | Freq: Every day | ORAL | Status: DC

## 2013-06-09 NOTE — Patient Instructions (Addendum)
Your physician recommends that you schedule a follow-up appointment in: ONE YEAR 

## 2013-06-13 ENCOUNTER — Encounter: Payer: Self-pay | Admitting: Cardiovascular Disease

## 2013-06-13 NOTE — Progress Notes (Signed)
Patient ID: Danielle Mckinney, female   DOB: 1951-06-14, 62 y.o.   MRN: [id number]     Reason for office visit Followup congestive heart failure  Danielle Mckinney has done quite well since last appointment. She has a long-standing history of nonischemic cardiomyopathy complicated by atrial fibrillation and embolic stroke and has a biventricular ICD in place (St. Jude). She has not had related discharges. Her recent device check on August 1 with Dr. Ladona Mckinney showed favorable findings. Abdomen no recent arrhythmic events. She appears to be a CRT responder. She has no physical limitations related to her failure or her previous neurological event. She is on chronic warfarin and has not had bleeding complications.    Allergies  Allergen Reactions  . Codeine Nausea And Vomiting  . Lisinopril Cough  . Penicillins Nausea And Vomiting  . Sulfa Antibiotics Nausea And Vomiting    Current Outpatient Prescriptions  Medication Sig Dispense Refill  . albuterol (PROVENTIL HFA;VENTOLIN HFA) 108 (90 BASE) MCG/ACT inhaler Inhale 2 puffs into the lungs every 6 (six) hours as needed. Shortness of breath      . Calcium Carbonate-Vit D-Min (CALTRATE 600+D PLUS PO) Take 1 tablet by mouth daily.       . carvedilol (COREG) 6.25 MG tablet Take 9.375 mg by mouth 2 (two) times daily with a meal.       . fish oil-omega-3 fatty acids 1000 MG capsule Take 2 g by mouth daily.        . furosemide (LASIX) 40 MG tablet Take 1 tablet (40 mg total) by mouth daily as needed. For swelling  90 tablet  3  . losartan (COZAAR) 100 MG tablet take 1 tablet by mouth once daily  30 tablet  6  . potassium chloride SA (K-DUR,KLOR-CON) 20 MEQ tablet Take 1 tablet (20 mEq total) by mouth daily as needed. Takes along with Furosemide when needed.  90 tablet  3  . spironolactone (ALDACTONE) 25 MG tablet Take 25 mg by mouth daily.        . temazepam (RESTORIL) 15 MG capsule Take 15 mg by mouth at bedtime as needed. For sleep      . warfarin (COUMADIN) 4 MG  tablet Take 1-1.5 tablets by mouth daily as directed by coumadin clinic  45 tablet  6  . pantoprazole (PROTONIX) 40 MG tablet Take 1 tablet (40 mg total) by mouth daily.  90 tablet  3   No current facility-administered medications for this visit.    Past Medical History  Diagnosis Date  . Atrial fibrillation   . CHF (congestive heart failure)   . HTN (hypertension)   . Stroke   . CIN I (cervical intraepithelial neoplasia I)   . Cancer     Breast cancer-BRACA I gene  . Fibroid   . 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 Biventricular ICD (implantable cardioverter-defibrillator) in place     St.Jude    Past Surgical History  Procedure Laterality Date  . Biv icd genertaor change out  12/20/2011    St.Jude  . Mastectomy    . Transthoracic echocardiogram  08/14/06  . Gynecologic cryosurgery    . Colposcopy    . Tubal ligation    . Breast surgery      Bilateral mastctomy and reconstruction TRAM  . Cervical cone biopsy    . Cardiac defibrillator placement    . Oophorectomy      BSO  . US echocardiography  06/25/2012  borderline concentric LVH,LV systolic fx mildly reduced,impaired LV relaxation    Family History  Problem Relation Age of Onset  . Coronary artery disease Other   . Breast cancer Sister     Age 86  . Hypertension Sister   . Hypertension Brother   . Heart disease Maternal Grandfather     History   Social History  . Marital Status: Divorced    Spouse Name: N/A    Number of Children: N/A  . Years of Education: N/A   Occupational History  . Not on file.   Social History Main Topics  . Smoking status: Never Smoker   . Smokeless tobacco: Never Used  . Alcohol Use: No  . Drug Use: No  . Sexual Activity: Yes    Birth Control/ Protection: Surgical   Other Topics Concern  . Not on file   Social History Narrative  . No narrative on file    Review of systems: The patient specifically denies any chest pain at rest  or with exertion, dyspnea at rest or with exertion, orthopnea, paroxysmal nocturnal dyspnea, syncope, palpitations, focal neurological deficits, intermittent claudication, lower extremity edema, unexplained weight gain, cough, hemoptysis or wheezing.  The patient also denies abdominal pain, nausea, vomiting, dysphagia, diarrhea, constipation, polyuria, polydipsia, dysuria, hematuria, frequency, urgency, abnormal bleeding or bruising, fever, chills, unexpected weight changes, mood swings, change in skin or hair texture, change in voice quality, auditory or visual problems, allergic reactions or rashes, new musculoskeletal complaints other than usual "aches and pains".   PHYSICAL EXAM BP 133/77  Pulse 61  Resp 16  Ht 5' 2.5" (1.588 m)  Wt 180 lb 11.2 oz (81.965 kg)  BMI 32.5 kg/m2  General: Alert, oriented x3, no distress, mildly obese Head: no evidence of trauma, PERRL, EOMI, no exophtalmos or lid lag, no myxedema, no xanthelasma; normal ears, nose and oropharynx Neck: normal jugular venous pulsations and no hepatojugular reflux; brisk carotid pulses without delay and no carotid bruits Chest: clear to auscultation, no signs of consolidation by percussion or palpation, normal fremitus, symmetrical and full respiratory excursions, healthy left subclavian ICD site Cardiovascular: normal position and quality of the apical impulse, regular rhythm, normal first and paradoxically split second heart sounds, no murmurs, rubs or gallops Abdomen: no tenderness or distention, no masses by palpation, no abnormal pulsatility or arterial bruits, normal bowel sounds, no hepatosplenomegaly Extremities: no clubbing, cyanosis or edema; 2+ radial, ulnar and brachial pulses bilaterally; 2+ right femoral, posterior tibial and dorsalis pedis pulses; 2+ left femoral, posterior tibial and dorsalis pedis pulses; no subclavian or femoral bruits Neurological: grossly nonfocal   EKG: Atrial sensed ventricular paced rhythm  with a remarkably narrow QRS,  BMET    Component Value Date/Time   NA 136 12/14/2011 1320   K 3.9 12/14/2011 1320   CL 100 12/14/2011 1320   CO2 26 12/14/2011 1320   GLUCOSE 104* 12/14/2011 1320   BUN 12 12/14/2011 1320   CREATININE 0.7 12/14/2011 1320   CALCIUM 9.8 12/14/2011 1320   GFRNONAA >60 04/02/2010 1941   GFRAA  Value: >60        The eGFR has been calculated using the MDRD equation. This calculation has not been validated in all clinical situations. eGFR's persistently <60 mL/min signify possible Chronic Kidney Disease. 04/02/2010 1941     ASSESSMENT AND PLAN Chronic systolic CHF (congestive heart failure) Nonischemic cardiomyopathy. Currently NYHA functional class 1-2, clinically appears to be euvolemic despite using only intermittent loop diuretics. She takes an angiotensin receptor  blocker and Aldactone. Previous attempts to increase the doses of beta blockers have not been well tolerated. She appears to be a definite responder to cardiac resynchronization therapy.  ICD-St.Jude CRT-D Normal device function (followed by Dr. Ladona Mckinney, last interrogated August 1) no signs of heart failure exacerbation by thoracic impedance measurements. 96 % biventricular pacing deficiency. No episodes of ventricular arrhythmia or atrial fibrillation recorded.  Atrial fibrillation No symptomatic events and no recent episodes on the fibrillator interrogation. On appropriate warfarin anticoagulation. No bleeding complications. Note history of stroke.  HYPERTENSION Adequate control. Sodium restriction is reinforced.  Orders Placed This Encounter  Procedures  . EKG 12-Lead   Meds ordered this encounter  Medications  . furosemide (LASIX) 40 MG tablet    Sig: Take 1 tablet (40 mg total) by mouth daily as needed. For swelling    Dispense:  90 tablet    Refill:  3  . potassium chloride SA (K-DUR,KLOR-CON) 20 MEQ tablet    Sig: Take 1 tablet (20 mEq total) by mouth daily as needed. Takes along with  Furosemide when needed.    Dispense:  90 tablet    Refill:  3  . pantoprazole (PROTONIX) 40 MG tablet    Sig: Take 1 tablet (40 mg total) by mouth daily.    Dispense:  90 tablet    Refill:  3    Dmitry Macomber  Thurmon Fair, MD, [facility] [phone] office [phone] pager

## 2013-06-13 NOTE — Assessment & Plan Note (Signed)
Adequate control. Sodium restriction is reinforced.

## 2013-06-13 NOTE — Assessment & Plan Note (Signed)
Normal device function (followed by Dr. Ladona Ridgel, last interrogated August 1) no signs of heart failure exacerbation by thoracic impedance measurements. 96 % biventricular pacing deficiency. No episodes of ventricular arrhythmia or atrial fibrillation recorded.

## 2013-06-13 NOTE — Assessment & Plan Note (Signed)
Nonischemic cardiomyopathy. Currently NYHA functional class 1-2, clinically appears to be euvolemic despite using only intermittent loop diuretics. She takes an angiotensin receptor blocker and Aldactone. Previous attempts to increase the doses of beta blockers have not been well tolerated. She appears to be a definite responder to cardiac resynchronization therapy.

## 2013-06-13 NOTE — Assessment & Plan Note (Addendum)
No symptomatic events and no recent episodes on the fibrillator interrogation. On appropriate warfarin anticoagulation. No bleeding complications. Note history of stroke.

## 2013-06-24 ENCOUNTER — Other Ambulatory Visit: Payer: Self-pay | Admitting: *Deleted

## 2013-06-24 MED ORDER — SPIRONOLACTONE 25 MG PO TABS: 25.0000 mg | ORAL_TABLET | Freq: Every day | ORAL | Status: DC

## 2013-07-07 ENCOUNTER — Ambulatory Visit (INDEPENDENT_AMBULATORY_CARE_PROVIDER_SITE_OTHER): Payer: Medicare Other | Admitting: Pharmacist Clinician (PhC)/ Clinical Pharmacy Specialist

## 2013-07-07 VITALS — BP 130/68 | HR 72

## 2013-07-07 DIAGNOSIS — Z7901 Long term (current) use of anticoagulants: Secondary | ICD-10-CM

## 2013-07-07 DIAGNOSIS — I6350 Cerebral infarction due to unspecified occlusion or stenosis of unspecified cerebral artery: Secondary | ICD-10-CM

## 2013-07-07 DIAGNOSIS — I4891 Unspecified atrial fibrillation: Secondary | ICD-10-CM

## 2013-07-07 DIAGNOSIS — I639 Cerebral infarction, unspecified: Secondary | ICD-10-CM

## 2013-07-07 LAB — POCT INR: INR: 3.5

## 2013-07-14 ENCOUNTER — Telehealth: Payer: Self-pay | Admitting: Oncology

## 2013-07-14 NOTE — Telephone Encounter (Signed)
Faxed pt medical records to [facility] °

## 2013-07-16 ENCOUNTER — Ambulatory Visit (INDEPENDENT_AMBULATORY_CARE_PROVIDER_SITE_OTHER): Payer: Medicare Other | Admitting: Pharmacist Clinician (PhC)/ Clinical Pharmacy Specialist

## 2013-07-16 VITALS — BP 124/70 | HR 72

## 2013-07-16 DIAGNOSIS — I4891 Unspecified atrial fibrillation: Secondary | ICD-10-CM

## 2013-07-16 DIAGNOSIS — I639 Cerebral infarction, unspecified: Secondary | ICD-10-CM

## 2013-07-16 DIAGNOSIS — Z7901 Long term (current) use of anticoagulants: Secondary | ICD-10-CM

## 2013-07-16 DIAGNOSIS — I6350 Cerebral infarction due to unspecified occlusion or stenosis of unspecified cerebral artery: Secondary | ICD-10-CM

## 2013-07-16 LAB — POCT INR: INR: 2.4

## 2013-08-15 ENCOUNTER — Ambulatory Visit (INDEPENDENT_AMBULATORY_CARE_PROVIDER_SITE_OTHER): Payer: Medicare Other | Admitting: Pharmacist Clinician (PhC)/ Clinical Pharmacy Specialist

## 2013-08-15 VITALS — BP 128/70 | HR 68

## 2013-08-15 DIAGNOSIS — I4891 Unspecified atrial fibrillation: Secondary | ICD-10-CM

## 2013-08-15 DIAGNOSIS — I6350 Cerebral infarction due to unspecified occlusion or stenosis of unspecified cerebral artery: Secondary | ICD-10-CM

## 2013-08-15 DIAGNOSIS — I639 Cerebral infarction, unspecified: Secondary | ICD-10-CM

## 2013-08-15 DIAGNOSIS — Z7901 Long term (current) use of anticoagulants: Secondary | ICD-10-CM

## 2013-08-15 LAB — POCT INR: INR: 2.5

## 2013-08-16 ENCOUNTER — Emergency Department (INDEPENDENT_AMBULATORY_CARE_PROVIDER_SITE_OTHER)
Admission: EM | Admit: 2013-08-16 | Discharge: 2013-08-16 | Disposition: A | Discharge status: Home / Self Care | Payer: Medicare Other | Source: Home / Self Care | Attending: Family Medicine | Admitting: Family Medicine

## 2013-08-16 ENCOUNTER — Encounter (HOSPITAL_COMMUNITY): Payer: Self-pay | Admitting: Emergency Medicine

## 2013-08-16 DIAGNOSIS — H8110 Benign paroxysmal vertigo, unspecified ear: Secondary | ICD-10-CM

## 2013-08-16 MED ORDER — FLUTICASONE PROPIONATE 50 MCG/ACT NA SUSP: 2.0000 | Freq: Every day | NASAL | Status: DC

## 2013-08-16 MED ORDER — DIAZEPAM 5 MG PO TABS: ORAL_TABLET | ORAL | Status: DC

## 2013-08-16 NOTE — ED Notes (Signed)
Pt c/o feeling dizzy onset this am Sxs increase when she lays down, bends over, or reaches up for something Hx of vertigo, CHF Denies: CP, SOB, weakness, f/v/d Alert w/no signs of acute distress talking in complete sentences... Steady gait w/NAD

## 2013-08-16 NOTE — ED Provider Notes (Signed)
CSN: [id number]     Arrival date & time 08/16/13  1759 History   First MD Initiated Contact with Patient 08/16/13 1852     Chief Complaint  Patient presents with  . Dizziness   (Consider location/radiation/quality/duration/timing/severity/associated sxs/prior Treatment) HPI Comments: Patient presents complaining of vertigo. Starting at about 10:00 this morning, she becomes vertiginous whenever she lays down flat. This is relieved by sitting upright. She has had this in the past as a consequence of an internal ear infection. She denies fever, chills, ringing in the ears, blurry vision, headache, NVD, or feeling sick at all. She does have a history of a stroke in 2007.   Past Medical History  Diagnosis Date  . Atrial fibrillation   . CHF (congestive heart failure)   . HTN (hypertension)   . Stroke   . CIN I (cervical intraepithelial neoplasia I)   . Cancer     Breast cancer-BRACA I gene  . Fibroid   . 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 Biventricular ICD (implantable cardioverter-defibrillator) in place     [facility]   Past Surgical History  Procedure Laterality Date  . Biv icd genertaor change out  12/20/2011    [facility]  . Mastectomy    . Transthoracic echocardiogram  08/14/06  . Gynecologic cryosurgery    . Colposcopy    . Tubal ligation    . Breast surgery      Bilateral mastctomy and reconstruction TRAM  . Cervical cone biopsy    . Cardiac defibrillator placement    . Oophorectomy      BSO  . US echocardiography  06/25/2012    borderline concentric LVH,LV systolic fx mildly reduced,impaired LV relaxation   Family History  Problem Relation Age of Onset  . Coronary artery disease Other   . Breast cancer Sister     Age 21  . Hypertension Sister   . Hypertension Brother   . Heart disease Maternal Grandfather    History  Substance Use Topics  . Smoking status: Never Smoker   . Smokeless tobacco: Never Used  . Alcohol Use: No    OB History   Grav Para Term Preterm Abortions TAB SAB Ect Mult Living   3 2 2  1     2      Review of Systems  Constitutional: Negative for fever and chills.  Eyes: Negative for visual disturbance.  Respiratory: Negative for cough and shortness of breath.   Cardiovascular: Negative for chest pain, palpitations and leg swelling.  Gastrointestinal: Negative for nausea, vomiting and abdominal pain.  Endocrine: Negative for polydipsia and polyuria.  Genitourinary: Negative for dysuria, urgency and frequency.  Musculoskeletal: Negative for arthralgias and myalgias.  Skin: Negative for rash.  Neurological: Positive for dizziness (vertigo) and speech difficulty (Chronic, late effect of stroke). Negative for weakness and light-headedness.    Allergies  Codeine; Lisinopril; Penicillins; and Sulfa antibiotics  Home Medications   Current Outpatient Rx  Name  Route  Sig  Dispense  Refill  . carvedilol (COREG) 6.25 MG tablet   Oral   Take 9.375 mg by mouth 2 (two) times daily with a meal.          . losartan (COZAAR) 100 MG tablet      take 1 tablet by mouth once daily   30 tablet   6   . spironolactone (ALDACTONE) 25 MG tablet   Oral   Take 1 tablet (25 mg total) by mouth daily.  30 tablet   6   . warfarin (COUMADIN) 4 MG tablet      Take 1-1.5 tablets by mouth daily as directed by coumadin clinic   45 tablet   6   . albuterol (PROVENTIL HFA;VENTOLIN HFA) 108 (90 BASE) MCG/ACT inhaler   Inhalation   Inhale 2 puffs into the lungs every 6 (six) hours as needed. Shortness of breath         . Calcium Carbonate-Vit D-Min (CALTRATE 600+D PLUS PO)   Oral   Take 1 tablet by mouth daily.          . diazepam (VALIUM) 5 MG tablet      1/2-1 tablet PO TID PRN for vertigo   15 tablet   0   . fish oil-omega-3 fatty acids 1000 MG capsule   Oral   Take 2 g by mouth daily.           . fluticasone (FLONASE) 50 MCG/ACT nasal spray   Nasal   Place 2 sprays into the nose  daily.   1 g   2   . furosemide (LASIX) 40 MG tablet   Oral   Take 1 tablet (40 mg total) by mouth daily as needed. For swelling   90 tablet   3   . pantoprazole (PROTONIX) 40 MG tablet   Oral   Take 1 tablet (40 mg total) by mouth daily.   90 tablet   3   . potassium chloride SA (K-DUR,KLOR-CON) 20 MEQ tablet   Oral   Take 1 tablet (20 mEq total) by mouth daily as needed. Takes along with Furosemide when needed.   90 tablet   3   . temazepam (RESTORIL) 15 MG capsule   Oral   Take 15 mg by mouth at bedtime as needed. For sleep          BP 152/83  Pulse 66  Temp(Src) 98.2 F (36.8 C) (Oral)  Resp 16  SpO2 99% Physical Exam  Nursing note and vitals reviewed. Constitutional: She is oriented to person, place, and time. Vital signs are normal. She appears well-developed and well-nourished. No distress.  HENT:  Head: Normocephalic and atraumatic.  Right Ear: External ear normal. A middle ear effusion (Serous effusion) is present.  Left Ear: External ear normal.  Mouth/Throat: Oropharynx is clear and moist. No oropharyngeal exudate.  Pulmonary/Chest: Effort normal. No respiratory distress.  Neurological: She is alert and oriented to person, place, and time. She has normal strength. She is not disoriented. No cranial nerve deficit or sensory deficit. Coordination normal.  Skin: Skin is warm and dry. No rash noted. She is not diaphoretic.  Psychiatric: She has a normal mood and affect. Judgment normal.    ED Course  Procedures (including critical care time) Labs Review Labs Reviewed - No data to display Imaging Review No results found.    MDM   1. BPPV (benign paroxysmal positional vertigo)    Treating with flonase to help with serous OM, diazepam for her symptoms. Followup as needed.    Meds ordered this encounter  Medications  . fluticasone (FLONASE) 50 MCG/ACT nasal spray    Sig: Place 2 sprays into the nose daily.    Dispense:  1 g    Refill:  2     Order Specific Question:  Supervising Provider    Answer:  Clementeen Graham, S [id number]  . diazepam (VALIUM) 5 MG tablet    Sig: 1/2-1 tablet PO TID PRN for vertigo  Dispense:  15 tablet    Refill:  0    Order Specific Question:  Supervising Provider    Answer:  Clementeen Graham, Kathie Rhodes [[id number]]     Graylon Good, PA-C 08/16/13 1936

## 2013-08-18 NOTE — ED Provider Notes (Signed)
Medical screening examination/treatment/procedure(s) were performed by a resident physician or non-physician practitioner and as the supervising physician I was immediately available for consultation/collaboration.  Kynzee Devinney, MD    Fergie Sherbert S Silviano Neuser, MD 08/18/13 0951 

## 2013-09-01 ENCOUNTER — Ambulatory Visit (INDEPENDENT_AMBULATORY_CARE_PROVIDER_SITE_OTHER): Payer: Medicare Other | Admitting: *Deleted

## 2013-09-01 DIAGNOSIS — I4891 Unspecified atrial fibrillation: Secondary | ICD-10-CM

## 2013-09-01 DIAGNOSIS — I5022 Chronic systolic (congestive) heart failure: Secondary | ICD-10-CM

## 2013-09-01 DIAGNOSIS — I509 Heart failure, unspecified: Secondary | ICD-10-CM

## 2013-09-02 ENCOUNTER — Encounter: Payer: Self-pay | Admitting: Internal Medicine

## 2013-09-02 LAB — REMOTE ICD DEVICE
AL IMPEDENCE ICD: 340 Ohm
AL THRESHOLD: 0.5 V
ATRIAL PACING ICD: 4.8 pct
BAMS-0001: 180 {beats}/min
BAMS-0003: 70 {beats}/min
HV IMPEDENCE: 48 Ohm
LV LEAD IMPEDENCE ICD: 660 Ohm
RV LEAD IMPEDENCE ICD: 430 Ohm
RV LEAD THRESHOLD: 0.625 V
TZAT-0001SLOWVT: 1
TZAT-0004FASTVT: 8
TZAT-0012FASTVT: 200 ms
TZAT-0012SLOWVT: 200 ms
TZAT-0013FASTVT: 1
TZAT-0013SLOWVT: 4
TZAT-0018FASTVT: NEGATIVE
TZAT-0019FASTVT: 7.5 V
TZAT-0020SLOWVT: 1 ms
TZON-0003FASTVT: 300 ms
TZON-0004FASTVT: 30
TZON-0004SLOWVT: 40
TZON-0005SLOWVT: 6
TZST-0001FASTVT: 2
TZST-0001FASTVT: 3
TZST-0001FASTVT: 5
TZST-0001SLOWVT: 2
TZST-0001SLOWVT: 4
TZST-0001SLOWVT: 5
TZST-0003FASTVT: 36 J
TZST-0003FASTVT: 40 J
TZST-0003FASTVT: 40 J
TZST-0003SLOWVT: 15 J
TZST-0003SLOWVT: 30 J

## 2013-09-03 ENCOUNTER — Telehealth: Payer: Self-pay | Admitting: Internal Medicine

## 2013-09-03 NOTE — Telephone Encounter (Signed)
Transmission received, patient aware. 

## 2013-09-03 NOTE — Telephone Encounter (Signed)
New question     Pt sent transmition on 11/3 she would like to know if it was received.

## 2013-09-04 NOTE — Progress Notes (Signed)
Remote icd received  

## 2013-09-05 ENCOUNTER — Encounter: Payer: Self-pay | Admitting: *Deleted

## 2013-09-17 ENCOUNTER — Other Ambulatory Visit: Payer: Self-pay | Admitting: Cardiovascular Disease

## 2013-09-17 NOTE — Telephone Encounter (Signed)
Rx was sent to pharmacy electronically. 

## 2013-09-18 ENCOUNTER — Ambulatory Visit (INDEPENDENT_AMBULATORY_CARE_PROVIDER_SITE_OTHER): Payer: Medicare Other | Admitting: Pharmacist Clinician (PhC)/ Clinical Pharmacy Specialist

## 2013-09-18 ENCOUNTER — Ambulatory Visit: Payer: Medicare Other | Admitting: Pharmacist Clinician (PhC)/ Clinical Pharmacy Specialist

## 2013-09-18 VITALS — BP 120/58 | HR 64

## 2013-09-18 DIAGNOSIS — I639 Cerebral infarction, unspecified: Secondary | ICD-10-CM

## 2013-09-18 DIAGNOSIS — I6350 Cerebral infarction due to unspecified occlusion or stenosis of unspecified cerebral artery: Secondary | ICD-10-CM

## 2013-09-18 DIAGNOSIS — I4891 Unspecified atrial fibrillation: Secondary | ICD-10-CM

## 2013-09-18 DIAGNOSIS — Z7901 Long term (current) use of anticoagulants: Secondary | ICD-10-CM

## 2013-10-05 ENCOUNTER — Other Ambulatory Visit: Payer: Self-pay | Admitting: Cardiovascular Disease

## 2013-10-06 NOTE — Telephone Encounter (Signed)
Rx was sent to pharmacy electronically. 

## 2013-10-14 ENCOUNTER — Ambulatory Visit (INDEPENDENT_AMBULATORY_CARE_PROVIDER_SITE_OTHER): Payer: Medicare Other | Admitting: Pharmacist Clinician (PhC)/ Clinical Pharmacy Specialist

## 2013-10-14 VITALS — BP 120/64 | HR 72

## 2013-10-14 DIAGNOSIS — I639 Cerebral infarction, unspecified: Secondary | ICD-10-CM

## 2013-10-14 DIAGNOSIS — I6350 Cerebral infarction due to unspecified occlusion or stenosis of unspecified cerebral artery: Secondary | ICD-10-CM

## 2013-10-14 DIAGNOSIS — Z7901 Long term (current) use of anticoagulants: Secondary | ICD-10-CM

## 2013-10-14 DIAGNOSIS — I4891 Unspecified atrial fibrillation: Secondary | ICD-10-CM

## 2013-10-14 LAB — POCT INR: INR: 2.2

## 2013-11-13 ENCOUNTER — Ambulatory Visit: Payer: Medicare Other | Admitting: Pharmacist Clinician (PhC)/ Clinical Pharmacy Specialist

## 2013-11-14 ENCOUNTER — Ambulatory Visit (INDEPENDENT_AMBULATORY_CARE_PROVIDER_SITE_OTHER): Payer: Medicare Other | Admitting: Pharmacist Clinician (PhC)/ Clinical Pharmacy Specialist

## 2013-11-14 VITALS — BP 130/72 | HR 76

## 2013-11-14 DIAGNOSIS — I6350 Cerebral infarction due to unspecified occlusion or stenosis of unspecified cerebral artery: Secondary | ICD-10-CM

## 2013-11-14 DIAGNOSIS — Z7901 Long term (current) use of anticoagulants: Secondary | ICD-10-CM

## 2013-11-14 DIAGNOSIS — I639 Cerebral infarction, unspecified: Secondary | ICD-10-CM

## 2013-11-14 DIAGNOSIS — I4891 Unspecified atrial fibrillation: Secondary | ICD-10-CM

## 2013-11-14 LAB — POCT INR: INR: 2.7

## 2013-11-26 ENCOUNTER — Telehealth: Payer: Self-pay | Admitting: Internal Medicine

## 2013-11-26 NOTE — Telephone Encounter (Signed)
New problem    Pt has a question about making her transmition when she gets back from her 2 wk trip?  Please give pt a call.

## 2013-11-26 NOTE — Telephone Encounter (Signed)
Remote appointment rescheduled to 2/17 at patient's request.

## 2013-11-29 ENCOUNTER — Encounter: Payer: Self-pay | Admitting: Internal Medicine

## 2013-12-06 ENCOUNTER — Other Ambulatory Visit: Payer: Self-pay | Admitting: Pharmacist Clinician (PhC)/ Clinical Pharmacy Specialist

## 2013-12-16 ENCOUNTER — Ambulatory Visit (INDEPENDENT_AMBULATORY_CARE_PROVIDER_SITE_OTHER): Payer: Medicare Other | Admitting: *Deleted

## 2013-12-16 DIAGNOSIS — I4891 Unspecified atrial fibrillation: Secondary | ICD-10-CM

## 2013-12-16 DIAGNOSIS — I5022 Chronic systolic (congestive) heart failure: Secondary | ICD-10-CM

## 2013-12-16 DIAGNOSIS — I509 Heart failure, unspecified: Secondary | ICD-10-CM

## 2013-12-17 ENCOUNTER — Encounter: Payer: Self-pay | Admitting: Internal Medicine

## 2013-12-17 LAB — MDC_IDC_ENUM_SESS_TYPE_REMOTE
HighPow Impedance: 47 Ohm
Implantable Pulse Generator Model: 3257
Implantable Pulse Generator Serial Number: 7027976
Lead Channel Impedance Value: 800 Ohm
Lead Channel Pacing Threshold Amplitude: 0.5 V
Lead Channel Pacing Threshold Amplitude: 0.625 V
Lead Channel Pacing Threshold Pulse Width: 0.5 ms
Lead Channel Sensing Intrinsic Amplitude: 11.3 mV
Lead Channel Setting Pacing Amplitude: 2 V
Lead Channel Setting Sensing Sensitivity: 0.5 mV
MDC IDC MSMT BATTERY REMAINING LONGEVITY: 64 mo
MDC IDC MSMT LEADCHNL RA IMPEDANCE VALUE: 330 Ohm
MDC IDC MSMT LEADCHNL RA PACING THRESHOLD PULSEWIDTH: 0.5 ms
MDC IDC MSMT LEADCHNL RA SENSING INTR AMPL: 5 mV
MDC IDC MSMT LEADCHNL RV IMPEDANCE VALUE: 430 Ohm
MDC IDC SET LEADCHNL LV PACING AMPLITUDE: 1.75 V
MDC IDC SET LEADCHNL LV PACING PULSEWIDTH: 0.8 ms
MDC IDC SET LEADCHNL RA PACING AMPLITUDE: 1.5 V
MDC IDC SET LEADCHNL RV PACING PULSEWIDTH: 0.5 ms
MDC IDC STAT BRADY RA PERCENT PACED: 3.9 %
MDC IDC STAT BRADY RV PERCENT PACED: 100 %
Zone Setting Detection Interval: 250 ms
Zone Setting Detection Interval: 300 ms
Zone Setting Detection Interval: 340 ms

## 2013-12-25 ENCOUNTER — Ambulatory Visit: Payer: Medicare Other | Admitting: Pharmacist Clinician (PhC)/ Clinical Pharmacy Specialist

## 2013-12-30 ENCOUNTER — Encounter: Payer: Self-pay | Admitting: Internal Medicine

## 2013-12-31 ENCOUNTER — Ambulatory Visit (INDEPENDENT_AMBULATORY_CARE_PROVIDER_SITE_OTHER): Payer: Medicare Other | Admitting: Pharmacist Clinician (PhC)/ Clinical Pharmacy Specialist

## 2013-12-31 VITALS — BP 110/62 | HR 68

## 2013-12-31 DIAGNOSIS — I4891 Unspecified atrial fibrillation: Secondary | ICD-10-CM

## 2013-12-31 DIAGNOSIS — Z7901 Long term (current) use of anticoagulants: Secondary | ICD-10-CM

## 2013-12-31 DIAGNOSIS — I639 Cerebral infarction, unspecified: Secondary | ICD-10-CM

## 2013-12-31 DIAGNOSIS — I6350 Cerebral infarction due to unspecified occlusion or stenosis of unspecified cerebral artery: Secondary | ICD-10-CM

## 2013-12-31 LAB — POCT INR: INR: 3.5

## 2014-01-15 ENCOUNTER — Encounter: Payer: Self-pay | Admitting: *Deleted

## 2014-01-26 ENCOUNTER — Ambulatory Visit (INDEPENDENT_AMBULATORY_CARE_PROVIDER_SITE_OTHER): Payer: Medicare Other | Admitting: Pharmacist Clinician (PhC)/ Clinical Pharmacy Specialist

## 2014-01-26 VITALS — BP 120/64 | HR 84

## 2014-01-26 DIAGNOSIS — I639 Cerebral infarction, unspecified: Secondary | ICD-10-CM

## 2014-01-26 DIAGNOSIS — I6350 Cerebral infarction due to unspecified occlusion or stenosis of unspecified cerebral artery: Secondary | ICD-10-CM

## 2014-01-26 DIAGNOSIS — Z7901 Long term (current) use of anticoagulants: Secondary | ICD-10-CM

## 2014-01-26 DIAGNOSIS — I4891 Unspecified atrial fibrillation: Secondary | ICD-10-CM

## 2014-01-26 LAB — POCT INR: INR: 3.6

## 2014-01-30 ENCOUNTER — Ambulatory Visit: Payer: Medicare Other | Admitting: Pharmacist Clinician (PhC)/ Clinical Pharmacy Specialist

## 2014-02-03 ENCOUNTER — Other Ambulatory Visit: Payer: Self-pay | Admitting: *Deleted

## 2014-02-03 MED ORDER — SPIRONOLACTONE 25 MG PO TABS: 25.0000 mg | ORAL_TABLET | Freq: Every day | ORAL | Status: DC

## 2014-02-03 NOTE — Telephone Encounter (Signed)
Rx refill sent to patient pharmacy   

## 2014-02-18 ENCOUNTER — Ambulatory Visit (INDEPENDENT_AMBULATORY_CARE_PROVIDER_SITE_OTHER): Payer: Medicare Other | Admitting: Pharmacist Clinician (PhC)/ Clinical Pharmacy Specialist

## 2014-02-18 ENCOUNTER — Ambulatory Visit: Payer: Medicare Other | Admitting: Pharmacist Clinician (PhC)/ Clinical Pharmacy Specialist

## 2014-02-18 DIAGNOSIS — I4891 Unspecified atrial fibrillation: Secondary | ICD-10-CM

## 2014-02-18 DIAGNOSIS — I639 Cerebral infarction, unspecified: Secondary | ICD-10-CM

## 2014-02-18 DIAGNOSIS — I6350 Cerebral infarction due to unspecified occlusion or stenosis of unspecified cerebral artery: Secondary | ICD-10-CM

## 2014-02-18 DIAGNOSIS — Z7901 Long term (current) use of anticoagulants: Secondary | ICD-10-CM

## 2014-02-18 LAB — POCT INR: INR: 2.4

## 2014-02-20 ENCOUNTER — Ambulatory Visit: Payer: Medicare Other | Admitting: Pharmacist Clinician (PhC)/ Clinical Pharmacy Specialist

## 2014-03-07 ENCOUNTER — Other Ambulatory Visit: Payer: Self-pay | Admitting: Cardiovascular Disease

## 2014-03-09 NOTE — Telephone Encounter (Signed)
Rx was sent to pharmacy electronically. 

## 2014-03-17 ENCOUNTER — Ambulatory Visit (INDEPENDENT_AMBULATORY_CARE_PROVIDER_SITE_OTHER): Payer: Medicare Other | Admitting: Pharmacist Clinician (PhC)/ Clinical Pharmacy Specialist

## 2014-03-17 VITALS — BP 122/68

## 2014-03-17 DIAGNOSIS — I639 Cerebral infarction, unspecified: Secondary | ICD-10-CM

## 2014-03-17 DIAGNOSIS — I6350 Cerebral infarction due to unspecified occlusion or stenosis of unspecified cerebral artery: Secondary | ICD-10-CM

## 2014-03-17 DIAGNOSIS — Z7901 Long term (current) use of anticoagulants: Secondary | ICD-10-CM

## 2014-03-17 DIAGNOSIS — I4891 Unspecified atrial fibrillation: Secondary | ICD-10-CM

## 2014-03-17 LAB — POCT INR: INR: 3.1

## 2014-03-26 ENCOUNTER — Encounter: Payer: Self-pay | Admitting: Internal Medicine

## 2014-03-26 ENCOUNTER — Ambulatory Visit (INDEPENDENT_AMBULATORY_CARE_PROVIDER_SITE_OTHER): Payer: Medicare Other | Admitting: Internal Medicine

## 2014-03-26 VITALS — BP 132/72 | HR 82 | Ht 63.0 in | Wt 173.0 lb

## 2014-03-26 DIAGNOSIS — Z95810 Presence of automatic (implantable) cardiac defibrillator: Secondary | ICD-10-CM

## 2014-03-26 DIAGNOSIS — T82110A Breakdown (mechanical) of cardiac electrode, initial encounter: Secondary | ICD-10-CM

## 2014-03-26 DIAGNOSIS — I5022 Chronic systolic (congestive) heart failure: Secondary | ICD-10-CM

## 2014-03-26 DIAGNOSIS — I4891 Unspecified atrial fibrillation: Secondary | ICD-10-CM

## 2014-03-26 DIAGNOSIS — I509 Heart failure, unspecified: Secondary | ICD-10-CM

## 2014-03-26 DIAGNOSIS — T82198A Other mechanical complication of other cardiac electronic device, initial encounter: Secondary | ICD-10-CM

## 2014-03-26 LAB — MDC_IDC_ENUM_SESS_TYPE_INCLINIC
Battery Remaining Longevity: 61.2 mo
Brady Statistic RV Percent Paced: 99 %
Date Time Interrogation Session: 20150528155323
HIGH POWER IMPEDANCE MEASURED VALUE: 47.5122
Implantable Pulse Generator Model: 3257
Lead Channel Impedance Value: 337.5 Ohm
Lead Channel Pacing Threshold Amplitude: 0.5 V
Lead Channel Pacing Threshold Amplitude: 0.75 V
Lead Channel Pacing Threshold Pulse Width: 0.5 ms
Lead Channel Pacing Threshold Pulse Width: 0.8 ms
Lead Channel Pacing Threshold Pulse Width: 0.8 ms
Lead Channel Setting Pacing Amplitude: 1.5 V
Lead Channel Setting Pacing Amplitude: 1.75 V
Lead Channel Setting Pacing Amplitude: 2 V
Lead Channel Setting Pacing Pulse Width: 0.8 ms
Lead Channel Setting Sensing Sensitivity: 0.5 mV
MDC IDC MSMT LEADCHNL LV IMPEDANCE VALUE: 737.5 Ohm
MDC IDC MSMT LEADCHNL LV PACING THRESHOLD AMPLITUDE: 0.75 V
MDC IDC MSMT LEADCHNL RA SENSING INTR AMPL: 5 mV
MDC IDC MSMT LEADCHNL RV IMPEDANCE VALUE: 437.5 Ohm
MDC IDC MSMT LEADCHNL RV PACING THRESHOLD AMPLITUDE: 0.625 V
MDC IDC MSMT LEADCHNL RV PACING THRESHOLD PULSEWIDTH: 0.5 ms
MDC IDC MSMT LEADCHNL RV SENSING INTR AMPL: 11.3 mV
MDC IDC PG SERIAL: 7027976
MDC IDC SET LEADCHNL RV PACING PULSEWIDTH: 0.5 ms
MDC IDC SET ZONE DETECTION INTERVAL: 300 ms
MDC IDC STAT BRADY RA PERCENT PACED: 3.3 %
Zone Setting Detection Interval: 250 ms
Zone Setting Detection Interval: 340 ms

## 2014-03-26 NOTE — Patient Instructions (Addendum)
Your physician wants you to follow-up in: 12 months with Dr Knox Saliva will receive a reminder letter in the mail two months in advance. If you don't receive a letter, please call our office to schedule the follow-up appointment.    Remote monitoring is used to monitor your Pacemaker or ICD from home. This monitoring reduces the number of office visits required to check your device to one time per year. It allows [place] to keep an eye on the functioning of your device to ensure it is working properly. You are scheduled for a device check from home on 06/29/14. You may send your transmission at any time that day. If you have a wireless device, the transmission will be sent automatically. After your physician reviews your transmission, you will receive a postcard with your next transmission date.  Your physician has requested that you have an echocardiogram. Echocardiography is a painless test that uses sound waves to create images of your heart. It provides your doctor with information about the size and shape of your heart and how well your heart's chambers and valves are working. This procedure takes approximately one hour. There are no restrictions for this procedure.   A chest x-ray takes a picture of the organs and structures inside the chest, including the heart, lungs, and blood vessels. This test can show several things, including, whether the heart is enlarges; whether fluid is building up in the lungs; and whether pacemaker / defibrillator leads are still in place.

## 2014-03-26 NOTE — Progress Notes (Signed)
HPI Danielle Mckinney returns today for followup. She is a pleasant 63 yo woman with an non-ischemic CM, S/P ICD implant, chronic systolic CHF, HTN, and chronic coumadin therapy. In the interim she has done well. She denies chest pain, shortness of breath, peripheral edema, syncope, or any ICD shocks. She remains active. No recent fevers or chills. On remote monitoring we noticed that she has had some noise on her ventricular lead.  Allergies  Allergen Reactions  . Codeine Nausea And Vomiting  . Lisinopril Cough  . Penicillins Nausea And Vomiting  . Sulfa Antibiotics Nausea And Vomiting     Current Outpatient Prescriptions  Medication Sig Dispense Refill  . Calcium Carbonate-Vit D-Min (CALTRATE 600+D PLUS PO) Take 1 tablet by mouth daily.       . carvedilol (COREG) 6.25 MG tablet take 1 and 1/2 tablets by mouth twice a day with meals  90 tablet  3  . fish oil-omega-3 fatty acids 1000 MG capsule Take 2 g by mouth daily.        . fluticasone (FLONASE) 50 MCG/ACT nasal spray Place 2 sprays into the nose daily.  1 g  2  . furosemide (LASIX) 40 MG tablet Take 1 tablet (40 mg total) by mouth daily as needed. For swelling  90 tablet  3  . losartan (COZAAR) 100 MG tablet take 1 tablet by mouth once daily  30 tablet  8  . pantoprazole (PROTONIX) 40 MG tablet Take 1 tablet (40 mg total) by mouth daily.  90 tablet  3  . potassium chloride SA (K-DUR,KLOR-CON) 20 MEQ tablet Take 1 tablet (20 mEq total) by mouth daily as needed. Takes along with Furosemide when needed.  90 tablet  3  . spironolactone (ALDACTONE) 25 MG tablet Take 1 tablet (25 mg total) by mouth daily.  30 tablet  6  . temazepam (RESTORIL) 15 MG capsule Take 15 mg by mouth at bedtime as needed. For sleep      . warfarin (COUMADIN) 4 MG tablet TAKE 1 AND 1/2 TABLETS DAILY AS DIRECTED BY COUMADIN CLINIC  45 tablet  5   No current facility-administered medications for this visit.     Past Medical History  Diagnosis Date  . Atrial fibrillation    . CHF (congestive heart failure)   . HTN (hypertension)   . Stroke   . CIN I (cervical intraepithelial neoplasia I)   . Cancer     Breast cancer-BRACA I gene  . Fibroid   . 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 Biventricular ICD (implantable cardioverter-defibrillator) in place     [facility]    ROS:   All systems reviewed and negative except as noted in the HPI.   Past Surgical History  Procedure Laterality Date  . Biv icd genertaor change out  12/20/2011    [facility]  . Mastectomy    . Transthoracic echocardiogram  08/14/06  . Gynecologic cryosurgery    . Colposcopy    . Tubal ligation    . Breast surgery      Bilateral mastctomy and reconstruction TRAM  . Cervical cone biopsy    . Cardiac defibrillator placement    . Oophorectomy      BSO  . US echocardiography  06/25/2012    borderline concentric LVH,LV systolic fx mildly reduced,impaired LV relaxation     Family History  Problem Relation Age of Onset  . Coronary artery disease Other   . Breast cancer Sister  Age 4  . Hypertension Sister   . Hypertension Brother   . Heart disease Maternal Grandfather      History   Social History  . Marital Status: Divorced    Spouse Name: N/A    Number of Children: N/A  . Years of Education: N/A   Occupational History  . Not on file.   Social History Main Topics  . Smoking status: Never Smoker   . Smokeless tobacco: Never Used  . Alcohol Use: No  . Drug Use: No  . Sexual Activity: Yes    Birth Control/ Protection: Surgical   Other Topics Concern  . Not on file   Social History Narrative  . No narrative on file     BP 132/72  Pulse 82  Ht 5\' 3"  (1.6 m)  Wt 173 lb (78.472 kg)  BMI 30.65 kg/m2  Physical Exam:  Well appearing middle-aged woman, NAD HEENT: Unremarkable Neck:  6 cm JVD, no thyromegally Back:  No CVA tenderness Lungs:  Clear with no wheezes, rales, or rhonchi. HEART:  Regular rate rhythm, no  murmurs, no rubs, no clicks Abd:  soft, positive bowel sounds, no organomegally, no rebound, no guarding Ext:  2 plus pulses, no edema, no cyanosis, no clubbing Skin:  No rashes no nodules Neuro:  CN II through XII intact, motor grossly intact  EKG normal sinus rhythm with ventricular pacing  DEVICE  Normal device function except for the ventricular noise as noted.  See PaceArt for details.   Assess/Plan:

## 2014-03-26 NOTE — Assessment & Plan Note (Signed)
She appears to have noise on her ventricular lead. We have recommend she undergo CXR to look for externalization of the lead. Will also check 2D echo. If her EF is above 40%, and with no prior ICD shocks, I would favor making her device a CRTP instead of CRTD.  If her EF is down, then she will need a new ICD plus/minus extraction of the old lead.

## 2014-03-26 NOTE — Assessment & Plan Note (Signed)
Her symptoms remain class 2A . Will follow.

## 2014-03-27 ENCOUNTER — Ambulatory Visit (INDEPENDENT_AMBULATORY_CARE_PROVIDER_SITE_OTHER)
Admission: RE | Admit: 2014-03-27 | Discharge: 2014-03-27 | Disposition: A | Discharge status: Home / Self Care | Payer: Medicare Other | Source: Ambulatory Visit | Attending: Internal Medicine | Admitting: Internal Medicine

## 2014-03-27 DIAGNOSIS — T82110A Breakdown (mechanical) of cardiac electrode, initial encounter: Secondary | ICD-10-CM

## 2014-03-27 DIAGNOSIS — I5022 Chronic systolic (congestive) heart failure: Secondary | ICD-10-CM

## 2014-03-27 DIAGNOSIS — T82198A Other mechanical complication of other cardiac electronic device, initial encounter: Secondary | ICD-10-CM

## 2014-03-27 DIAGNOSIS — I509 Heart failure, unspecified: Secondary | ICD-10-CM

## 2014-04-14 ENCOUNTER — Ambulatory Visit (HOSPITAL_COMMUNITY): Payer: Medicare Other | Attending: Cardiology | Admitting: Radiology

## 2014-04-14 DIAGNOSIS — I509 Heart failure, unspecified: Secondary | ICD-10-CM | POA: Insufficient documentation

## 2014-04-14 DIAGNOSIS — I079 Rheumatic tricuspid valve disease, unspecified: Secondary | ICD-10-CM | POA: Insufficient documentation

## 2014-04-14 DIAGNOSIS — I4891 Unspecified atrial fibrillation: Secondary | ICD-10-CM

## 2014-04-14 DIAGNOSIS — I5022 Chronic systolic (congestive) heart failure: Secondary | ICD-10-CM

## 2014-04-14 NOTE — Progress Notes (Signed)
Echocardiogram performed.  

## 2014-04-15 ENCOUNTER — Ambulatory Visit (INDEPENDENT_AMBULATORY_CARE_PROVIDER_SITE_OTHER): Payer: Medicare Other | Admitting: Pharmacist Clinician (PhC)/ Clinical Pharmacy Specialist

## 2014-04-15 VITALS — BP 116/64 | HR 72

## 2014-04-15 DIAGNOSIS — Z7901 Long term (current) use of anticoagulants: Secondary | ICD-10-CM

## 2014-04-15 DIAGNOSIS — I6350 Cerebral infarction due to unspecified occlusion or stenosis of unspecified cerebral artery: Secondary | ICD-10-CM

## 2014-04-15 DIAGNOSIS — I4891 Unspecified atrial fibrillation: Secondary | ICD-10-CM

## 2014-04-15 DIAGNOSIS — I639 Cerebral infarction, unspecified: Secondary | ICD-10-CM

## 2014-04-15 LAB — POCT INR: INR: 2.1

## 2014-04-16 ENCOUNTER — Encounter: Payer: Self-pay | Admitting: Gynecology

## 2014-04-16 ENCOUNTER — Other Ambulatory Visit (HOSPITAL_COMMUNITY)
Admission: RE | Admit: 2014-04-16 | Discharge: 2014-04-16 | Disposition: A | Discharge status: Home / Self Care | Payer: Medicare Other | Source: Ambulatory Visit | Attending: Gynecology | Admitting: Gynecology

## 2014-04-16 ENCOUNTER — Ambulatory Visit (INDEPENDENT_AMBULATORY_CARE_PROVIDER_SITE_OTHER): Payer: Medicare Other | Admitting: Gynecology

## 2014-04-16 VITALS — BP 128/86 | Ht 63.75 in | Wt 175.0 lb

## 2014-04-16 DIAGNOSIS — Z124 Encounter for screening for malignant neoplasm of cervix: Secondary | ICD-10-CM

## 2014-04-16 DIAGNOSIS — Z01419 Encounter for gynecological examination (general) (routine) without abnormal findings: Secondary | ICD-10-CM | POA: Insufficient documentation

## 2014-04-16 DIAGNOSIS — N951 Menopausal and female climacteric states: Secondary | ICD-10-CM

## 2014-04-16 DIAGNOSIS — Z87410 Personal history of cervical dysplasia: Secondary | ICD-10-CM

## 2014-04-16 DIAGNOSIS — Z853 Personal history of malignant neoplasm of breast: Secondary | ICD-10-CM

## 2014-04-16 DIAGNOSIS — D251 Intramural leiomyoma of uterus: Secondary | ICD-10-CM

## 2014-04-16 DIAGNOSIS — Z780 Asymptomatic menopausal state: Secondary | ICD-10-CM

## 2014-04-16 DIAGNOSIS — Z1151 Encounter for screening for human papillomavirus (HPV): Secondary | ICD-10-CM | POA: Insufficient documentation

## 2014-04-16 NOTE — Progress Notes (Signed)
Adams [birth year] [id number]   History:    63 y.o.  for annual gyn exam with long-standing history of on and off left breast tenderness. Patient with past history of breast cancer (bilateral mastectomy and reconstruction/tram/chemotherapy and bone marrow transplant) who had positive BRCA one and subsequently had bilateral salpingo-oophorectomy.  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 Subsequent Pap smears have been normal  The patient has been followed for small fibroids. Patient states that her shingles and Tdap vaccines are up to date. She also reports a normal colonoscopy 5 years ago.  Patient has 3 sisters of which one died of breast cancer and was never tested for the BRCA1 or BRCA2 gene mutation. The other 2 sisters were tested and were negative.  Patient does suffer times for vasomotor symptoms.  Past medical history,surgical history, family history and social history were all reviewed and documented in the EPIC chart.  Gynecologic History No LMP recorded. Patient is postmenopausal. Contraception: post menopausal status Last Pap: 2012. Results were: normal Last mammogram: The patient had bilateral mastectomies. Results were: See above  Obstetric History OB History  Gravida Para Term Preterm AB SAB TAB Ectopic Multiple Living  $Remov'3 2 2  1     2    'RGwtaP$ # Outcome Date GA Lbr Len/2nd Weight Sex Delivery Anes PTL Lv  3 ABT           2 TRM           1 TRM                ROS: A ROS was performed and pertinent positives and negatives are included in the history.  GENERAL: No fevers or chills. HEENT: No change in vision, no earache, sore throat or sinus congestion. NECK: No pain or stiffness. CARDIOVASCULAR: No chest pain or pressure. No palpitations. PULMONARY: No shortness of breath, cough or wheeze.  GASTROINTESTINAL: No abdominal pain, nausea, vomiting or diarrhea, melena or bright red blood per rectum. GENITOURINARY: No urinary frequency, urgency, hesitancy or dysuria. MUSCULOSKELETAL: No joint or muscle pain, no back pain, no recent trauma. DERMATOLOGIC: No rash, no itching, no lesions. ENDOCRINE: No polyuria, polydipsia, no heat or cold intolerance. No recent change in weight. HEMATOLOGICAL: No anemia or easy bruising or bleeding. NEUROLOGIC: No headache, seizures, numbness, tingling or weakness. PSYCHIATRIC: No depression, no loss of interest in normal activity or change in sleep pattern.     Exam: chaperone present  BP 128/86  Ht 5' 3.75" (1.619 m)  Wt 175 lb (79.379 kg)  BMI 30.28 kg/m2  Body mass index is 30.28 kg/(m^2).  General appearance : Well developed well nourished female. No acute distress HEENT: Neck supple, trachea midline, no carotid bruits, no thyroidmegaly Lungs: Clear to auscultation, no rhonchi or wheezes, or rib retractions  Heart: Regular rate and rhythm, no murmurs or gallops Breast:Examined in sitting and supine position were symmetrical in appearance, no palpable masses or tenderness,  no skin retraction, no nipple inversion, no nipple discharge, no skin discoloration, no axillary or supraclavicular lymphadenopathy Abdomen: no palpable masses or tenderness, no rebound or guarding Extremities: no edema or skin discoloration or tenderness  Pelvic:  Bartholin, Urethra, Skene Glands: Within normal limits             Vagina: No gross lesions or discharge, atrophic changes  Cervix: No gross  lesions or discharge  Uterus  anteverted, normal size, shape and consistency, non-tender and mobile  Adnexa  Without masses or tenderness  Anus and perineum  normal   Rectovaginal  normal sphincter tone without palpated masses or tenderness             Hemoccult PCP provides     Assessment/Plan:  63 y.o. female postmenopausal with history of breast cancer several years  ago doing well. PCP is Dr. whose been doing her blood work. Her cardiologist is Dr.Croitoru who is monitoring her atrial fibrillation and hypertension. Pap smear was done today. She will need a bone density study in 2016. She was monitored on the importance of calcium vitamin D and regular exercise for osteoporosis prevention.  Note: This dictation was prepared with  Dragon/digital dictation along withSmart phrase technology. Any transcriptional errors that result from this process are unintentional.   Terrance Mass MD, 8:44 PM 04/16/2014

## 2014-04-17 ENCOUNTER — Telehealth: Payer: Self-pay | Admitting: Internal Medicine

## 2014-04-17 NOTE — Telephone Encounter (Signed)
Spoke with patient and let her know Dr Lovena Le here next Tues and will have him address

## 2014-04-17 NOTE — Telephone Encounter (Signed)
Follow up     Pt needs a call back about her 6/16 test please.

## 2014-04-21 LAB — CYTOLOGY - PAP

## 2014-04-21 NOTE — Telephone Encounter (Signed)
Dr Lovena Le wants her to come in to discuss plan

## 2014-04-28 ENCOUNTER — Ambulatory Visit (INDEPENDENT_AMBULATORY_CARE_PROVIDER_SITE_OTHER): Payer: Medicare Other | Admitting: Internal Medicine

## 2014-04-28 ENCOUNTER — Encounter: Payer: Self-pay | Admitting: Internal Medicine

## 2014-04-28 VITALS — BP 140/70 | HR 72 | Ht 62.5 in | Wt 184.0 lb

## 2014-04-28 DIAGNOSIS — I509 Heart failure, unspecified: Secondary | ICD-10-CM

## 2014-04-28 DIAGNOSIS — I4891 Unspecified atrial fibrillation: Secondary | ICD-10-CM

## 2014-04-28 DIAGNOSIS — Z95810 Presence of automatic (implantable) cardiac defibrillator: Secondary | ICD-10-CM

## 2014-04-28 DIAGNOSIS — I5022 Chronic systolic (congestive) heart failure: Secondary | ICD-10-CM

## 2014-04-28 DIAGNOSIS — I480 Paroxysmal atrial fibrillation: Secondary | ICD-10-CM

## 2014-04-28 DIAGNOSIS — I428 Other cardiomyopathies: Secondary | ICD-10-CM

## 2014-04-28 LAB — MDC_IDC_ENUM_SESS_TYPE_INCLINIC
Battery Remaining Longevity: 60 mo
Brady Statistic RV Percent Paced: 98 %
Date Time Interrogation Session: 20150630130749
HIGH POWER IMPEDANCE MEASURED VALUE: 45.7941
Implantable Pulse Generator Model: 3257
Implantable Pulse Generator Serial Number: 7027976
Lead Channel Impedance Value: 700 Ohm
Lead Channel Pacing Threshold Amplitude: 0.5 V
Lead Channel Pacing Threshold Amplitude: 0.625 V
Lead Channel Pacing Threshold Amplitude: 0.75 V
Lead Channel Pacing Threshold Amplitude: 0.75 V
Lead Channel Pacing Threshold Pulse Width: 0.5 ms
Lead Channel Pacing Threshold Pulse Width: 0.8 ms
Lead Channel Pacing Threshold Pulse Width: 0.8 ms
Lead Channel Sensing Intrinsic Amplitude: 5 mV
Lead Channel Setting Pacing Pulse Width: 0.5 ms
Lead Channel Setting Sensing Sensitivity: 0.5 mV
MDC IDC MSMT LEADCHNL RA IMPEDANCE VALUE: 325 Ohm
MDC IDC MSMT LEADCHNL RV IMPEDANCE VALUE: 425 Ohm
MDC IDC MSMT LEADCHNL RV PACING THRESHOLD PULSEWIDTH: 0.5 ms
MDC IDC MSMT LEADCHNL RV SENSING INTR AMPL: 11.3 mV
MDC IDC SET LEADCHNL LV PACING AMPLITUDE: 1.75 V
MDC IDC SET LEADCHNL LV PACING PULSEWIDTH: 0.8 ms
MDC IDC SET LEADCHNL RA PACING AMPLITUDE: 1.5 V
MDC IDC SET LEADCHNL RV PACING AMPLITUDE: 2 V
MDC IDC STAT BRADY RA PERCENT PACED: 0.95 %

## 2014-04-28 NOTE — Assessment & Plan Note (Signed)
Her EF has normalized. She will continue her current meds.

## 2014-04-28 NOTE — Patient Instructions (Signed)
Remote monitoring is used to monitor your ICD from home. This monitoring reduces the number of office visits required to check your device to one time per year. It allows [place] to keep an eye on the functioning of your device to ensure it is working properly. You are scheduled for a device check from home on 07-30-2014. You may send your transmission at any time that day. If you have a wireless device, the transmission will be sent automatically. After your physician reviews your transmission, you will receive a postcard with your next transmission date.  Your physician recommends that you schedule a follow-up appointment in: 12 months with Dr.Taylor

## 2014-04-28 NOTE — Assessment & Plan Note (Signed)
Her ICD has noise on her LV lead. We discussed all of the options including lead extraction and insertion of a new ICD lead. The addition of a new ICD lead to her current system and capping of the old lead. However because of normalization of her LV function and because she has not had any ventricular arrhythmias, I have recommended that she have her ICD tachy therapies disabled. If she has further dysfunction of her RV lead, then she will require a new RV lead. Will follow.

## 2014-04-28 NOTE — Progress Notes (Signed)
HPI Mrs. Delconte returns today for followup. She is a pleasant 63 yo woman with an non-ischemic CM, S/P ICD implant, chronic systolic CHF, HTN, and chronic coumadin therapy. In the interim she has done well. She denies chest pain, shortness of breath, peripheral edema, syncope, or any ICD shocks. She remains active. No recent fevers or chills. On remote monitoring we noticed that she has had some noise on her ventricular lead. 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 She has undergone echo to help guide our treatment. She has normalized LV function. She is not PPM dependent.  Allergies  Allergen Reactions  . Codeine Nausea And Vomiting  . Lisinopril Cough  . Penicillins Nausea And Vomiting  . Sulfa Antibiotics Nausea And Vomiting     Current Outpatient Prescriptions  Medication Sig Dispense Refill  . Calcium Carbonate-Vit D-Min (CALTRATE 600+D PLUS PO) Take 1 tablet by mouth daily.       . carvedilol (COREG) 6.25 MG tablet take 1 and 1/2 tablets by mouth twice a day with meals  90 tablet  3  . fish oil-omega-3 fatty acids 1000 MG capsule Take 2 g by mouth daily.        . fluticasone (FLONASE) 50 MCG/ACT nasal spray Place 2 sprays into the nose as needed.      . furosemide (LASIX) 40 MG tablet Take 1 tablet (40 mg total) by mouth daily as needed. For swelling  90 tablet  3  . losartan (COZAAR) 100 MG tablet take 1 tablet by mouth once daily  30 tablet  8  . pantoprazole (PROTONIX) 40 MG tablet Take 40 mg by mouth as needed.      . potassium chloride SA (K-DUR,KLOR-CON) 20 MEQ tablet Take 1 tablet (20 mEq total) by mouth daily as needed. Takes along with Furosemide when needed.  90 tablet  3  . spironolactone (ALDACTONE) 25 MG tablet Take 1 tablet (25 mg total) by mouth daily.  30 tablet  6  . temazepam (RESTORIL) 15 MG capsule Take 15 mg by mouth at bedtime as needed. For sleep      . warfarin (COUMADIN) 4 MG tablet TAKE 1 AND 1/2 TABLETS DAILY AS DIRECTED BY  COUMADIN CLINIC  45 tablet  5   No current facility-administered medications for this visit.     Past Medical History  Diagnosis Date  . Atrial fibrillation   . CHF (congestive heart failure)   . HTN (hypertension)   . Stroke   . CIN I (cervical intraepithelial neoplasia I)   . Cancer     Breast cancer-BRACA I gene  . Fibroid   . 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 Biventricular ICD (implantable cardioverter-defibrillator) in place     [employer]    ROS:   All systems reviewed and negative except as noted in the HPI.   Past Surgical History  Procedure Laterality Date  . Biv icd genertaor change out  12/20/2011    [employer]  . Mastectomy    . Transthoracic echocardiogram  08/14/06  . Gynecologic cryosurgery    . Colposcopy    . Tubal ligation    . Breast surgery      Bilateral mastctomy and reconstruction TRAM  . Cervical cone biopsy    . Cardiac defibrillator placement    . Oophorectomy      BSO  . US echocardiography  06/25/2012    borderline concentric LVH,LV systolic fx mildly reduced,impaired  LV relaxation     Family History  Problem Relation Age of Onset  . Coronary artery disease Other   . Breast cancer Sister     Age 66  . Hypertension Sister   . Hypertension Brother   . Heart disease Maternal Grandfather      History   Social History  . Marital Status: Divorced    Spouse Name: N/A    Number of Children: N/A  . Years of Education: N/A   Occupational History  . Not on file.   Social History Main Topics  . Smoking status: Never Smoker   . Smokeless tobacco: Never Used  . Alcohol Use: No  . Drug Use: No  . Sexual Activity: Yes    Birth Control/ Protection: Surgical   Other Topics Concern  . Not on file   Social History Narrative  . No narrative on file     BP 140/70  Pulse 72  Ht 5' 2.5" (1.588 m)  Wt 184 lb (83.462 kg)  BMI 33.10 kg/m2  Physical Exam:  Well appearing middle-aged woman,  NAD HEENT: Unremarkable Neck:  6 cm JVD, no thyromegally Back:  No CVA tenderness Lungs:  Clear with no wheezes, rales, or rhonchi. HEART:  Regular rate rhythm, no murmurs, no rubs, no clicks Abd:  soft, positive bowel sounds, no organomegally, no rebound, no guarding Ext:  2 plus pulses, no edema, no cyanosis, no clubbing Skin:  No rashes no nodules Neuro:  CN II through XII intact, motor grossly intact  EKG normal sinus rhythm with bi-ventricular pacing  DEVICE  Normal device function except for the ventricular noise as noted.  See PaceArt for details.   Assess/Plan:

## 2014-05-13 ENCOUNTER — Ambulatory Visit: Payer: Medicare Other | Admitting: Pharmacist Clinician (PhC)/ Clinical Pharmacy Specialist

## 2014-05-18 ENCOUNTER — Ambulatory Visit (INDEPENDENT_AMBULATORY_CARE_PROVIDER_SITE_OTHER): Payer: Medicare Other | Admitting: Pharmacist Clinician (PhC)/ Clinical Pharmacy Specialist

## 2014-05-18 DIAGNOSIS — Z7901 Long term (current) use of anticoagulants: Secondary | ICD-10-CM

## 2014-05-18 DIAGNOSIS — I6350 Cerebral infarction due to unspecified occlusion or stenosis of unspecified cerebral artery: Secondary | ICD-10-CM

## 2014-05-18 DIAGNOSIS — I4891 Unspecified atrial fibrillation: Secondary | ICD-10-CM

## 2014-05-18 DIAGNOSIS — I639 Cerebral infarction, unspecified: Secondary | ICD-10-CM

## 2014-05-18 LAB — POCT INR: INR: 2

## 2014-06-07 ENCOUNTER — Other Ambulatory Visit: Payer: Self-pay | Admitting: Pharmacist Clinician (PhC)/ Clinical Pharmacy Specialist

## 2014-06-12 ENCOUNTER — Other Ambulatory Visit: Payer: Self-pay | Admitting: Family Medicine

## 2014-06-12 DIAGNOSIS — R52 Pain, unspecified: Secondary | ICD-10-CM

## 2014-06-15 ENCOUNTER — Ambulatory Visit (INDEPENDENT_AMBULATORY_CARE_PROVIDER_SITE_OTHER): Payer: Medicare Other | Admitting: Cardiovascular Disease

## 2014-06-15 ENCOUNTER — Ambulatory Visit (INDEPENDENT_AMBULATORY_CARE_PROVIDER_SITE_OTHER): Payer: Medicare Other | Admitting: Pharmacist Clinician (PhC)/ Clinical Pharmacy Specialist

## 2014-06-15 ENCOUNTER — Encounter: Payer: Self-pay | Admitting: Cardiovascular Disease

## 2014-06-15 VITALS — BP 120/66 | HR 72 | Resp 16 | Ht 62.5 in | Wt 182.1 lb

## 2014-06-15 DIAGNOSIS — I4891 Unspecified atrial fibrillation: Secondary | ICD-10-CM

## 2014-06-15 DIAGNOSIS — I480 Paroxysmal atrial fibrillation: Secondary | ICD-10-CM

## 2014-06-15 DIAGNOSIS — I10 Essential (primary) hypertension: Secondary | ICD-10-CM

## 2014-06-15 DIAGNOSIS — Z95810 Presence of automatic (implantable) cardiac defibrillator: Secondary | ICD-10-CM

## 2014-06-15 DIAGNOSIS — Z7901 Long term (current) use of anticoagulants: Secondary | ICD-10-CM

## 2014-06-15 DIAGNOSIS — I5022 Chronic systolic (congestive) heart failure: Secondary | ICD-10-CM

## 2014-06-15 DIAGNOSIS — I509 Heart failure, unspecified: Secondary | ICD-10-CM

## 2014-06-15 DIAGNOSIS — I639 Cerebral infarction, unspecified: Secondary | ICD-10-CM

## 2014-06-15 DIAGNOSIS — I6350 Cerebral infarction due to unspecified occlusion or stenosis of unspecified cerebral artery: Secondary | ICD-10-CM

## 2014-06-15 LAB — POCT INR: INR: 1.8

## 2014-06-15 NOTE — Progress Notes (Signed)
Patient ID: Danielle Mckinney, female   DOB: May 12, 1951, 63 y.o.   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 "Noise" was seen on the RV lead, but no inappropriate therapies were delivered. She saw Dr. Lovena Le and her tachycardia therapies have been turned off. She has been a CRT responder and now has normal left ventricular systolic function. She had a nonischemic cardiomyopathy, presumably related to anthracycline chemotherapy for breast cancer. This was complicated by atrial fibrillation and an embolic stroke. She continues to take warfarin anticoagulation without bleeding complication and without new embolic events. She does not have any residual effects from her stroke. She becomes short of breath only when she has to climb stairs, but goes to the gymnasium 4-5 times a week participating in water aerobics in Silver sneakers   Allergies  Allergen Reactions  . Codeine Nausea And Vomiting  . Lisinopril Cough  . Penicillins Nausea And Vomiting  . Sulfa Antibiotics Nausea And Vomiting    Current Outpatient Prescriptions  Medication Sig Dispense Refill  . Calcium Carbonate-Vit D-Min (CALTRATE 600+D PLUS PO) Take 1 tablet by mouth daily.       . carvedilol (COREG) 6.25 MG tablet take 1 and 1/2 tablets by mouth twice a day with meals  90 tablet  3  . fish oil-omega-3 fatty acids 1000 MG capsule Take 2 g by mouth daily.        . fluticasone (FLONASE) 50 MCG/ACT nasal spray Place 2 sprays into the nose as needed.      . furosemide (LASIX) 40 MG tablet Take 1 tablet (40 mg total) by mouth daily as needed. For swelling  90 tablet  3  . losartan (COZAAR) 100 MG tablet take 1 tablet by mouth once daily  30 tablet  8  . pantoprazole (PROTONIX) 40 MG tablet Take 40 mg by  mouth as needed.      . potassium chloride SA (K-DUR,KLOR-CON) 20 MEQ tablet Take 1 tablet (20 mEq total) by mouth daily as needed. Takes along with Furosemide when needed.  90 tablet  3  . spironolactone (ALDACTONE) 25 MG tablet Take 1 tablet (25 mg total) by mouth daily.  30 tablet  6  . temazepam (RESTORIL) 15 MG capsule Take 15 mg by mouth at bedtime as needed. For sleep      . warfarin (COUMADIN) 4 MG tablet TAKE 1 AND 1/2 TABLETS BY MOUTH DAILY AS DIRECETD BY COUMADIN CLINIC  45 tablet  5   No current facility-administered medications for this visit.    Past Medical History  Diagnosis Date  . Atrial fibrillation   . CHF (congestive heart failure)   . HTN (hypertension)   . Stroke   . CIN I (cervical intraepithelial neoplasia I)   . Cancer     Breast cancer-BRACA I gene  . Fibroid   . 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 Biventricular ICD (implantable cardioverter-defibrillator) in place     [employer]Jude    Past Surgical History  Procedure Laterality Date  . Biv icd genertaor change out  12/20/2011    [facility]  . Mastectomy    . Transthoracic echocardiogram  08/14/06  . Gynecologic cryosurgery    . Colposcopy    . Tubal ligation    .  Breast surgery      Bilateral mastctomy and reconstruction TRAM  . Cervical cone biopsy    . Cardiac defibrillator placement    . Oophorectomy      BSO  . US echocardiography  06/25/2012    borderline concentric LVH,LV systolic fx mildly reduced,impaired LV relaxation    Family History  Problem Relation Age of Onset  . Coronary artery disease Other   . Breast cancer Sister     Age 38  . Hypertension Sister   . Hypertension Brother   . Heart disease Maternal Grandfather     History   Social History  . Marital Status: Divorced    Spouse Name: N/A    Number of Children: N/A  . Years of Education: N/A   Occupational History  . Not on file.   Social History Main Topics  . Smoking status: Never  Smoker   . Smokeless tobacco: Never Used  . Alcohol Use: No  . Drug Use: No  . Sexual Activity: Yes    Birth Control/ Protection: Surgical   Other Topics Concern  . Not on file   Social History Narrative  . No narrative on file    Review of systems: The patient specifically denies any chest pain at rest or with exertion, dyspnea at rest or with exertion, orthopnea, paroxysmal nocturnal dyspnea, syncope, palpitations, focal neurological deficits, intermittent claudication, lower extremity edema, unexplained weight gain, cough, hemoptysis or wheezing.  The patient also denies abdominal pain, nausea, vomiting, dysphagia, diarrhea, constipation, polyuria, polydipsia, dysuria, hematuria, frequency, urgency, abnormal bleeding or bruising, fever, chills, unexpected weight changes, mood swings, change in skin or hair texture, change in voice quality, auditory or visual problems, allergic reactions or rashes, new musculoskeletal complaints other than usual "aches and pains".   PHYSICAL EXAM BP 120/66  Pulse 72  Resp 16  Ht 5' 2.5" (1.588 m)  Wt 182 lb 1.6 oz (82.6 kg)  BMI 32.76 kg/m2  General: Alert, oriented x3, no distress Head: no evidence of trauma, PERRL, EOMI, no exophtalmos or lid lag, no myxedema, no xanthelasma; normal ears, nose and oropharynx Neck: normal jugular venous pulsations and no hepatojugular reflux; brisk carotid pulses without delay and no carotid bruits Chest: clear to auscultation, no signs of consolidation by percussion or palpation, normal fremitus, symmetrical and full respiratory excursions Cardiovascular: normal position and quality of the apical impulse, regular rhythm, normal first and second heart sounds, no murmurs, rubs or gallops Abdomen: no tenderness or distention, no masses by palpation, no abnormal pulsatility or arterial bruits, normal bowel sounds, no hepatosplenomegaly Extremities: no clubbing, cyanosis or edema; 2+ radial, ulnar and brachial pulses  bilaterally; 2+ right femoral, posterior tibial and dorsalis pedis pulses; 2+ left femoral, posterior tibial and dorsalis pedis pulses; no subclavian or femoral bruits Neurological: grossly nonfocal   EKG: Atrial sensed ventricular paced, and remarkably narrow QRS paced complex, only 126 ms  Labs August 14, Dr. Justin Mend: glucose 100, creatinine 0.74, potassium 4.2, and cholesterol 185, triglycerides 121, HDL 42, LDL 119  ASSESSMENT AND PLAN  Nonischemic cardiomyopathy Currently NYHA functional class 1-2, clinically appears to be euvolemic despite using only intermittent loop diuretics. She takes an angiotensin receptor blocker and Aldactone. Previous attempts to increase the doses of beta blockers have not been well tolerated. She appears to be a definite responder to cardiac resynchronization therapy, now with normal left ventricular systolic function  ICD-[employer]ude CRT-D  Followed by Dr. Lovena Le, last interrogated May 2015. No signs of heart failure exacerbation by  thoracic impedance measurements. 96 % biventricular pacing efficiency. No episodes of ventricular arrhythmia or atrial fibrillation recorded. Defibrillator malfunction. Tachycardia therapies turned off  Atrial fibrillation  No symptomatic events and no recent episodes on defibrillator interrogation. On appropriate warfarin anticoagulation. No bleeding complications. Note history of stroke.   HYPERTENSION  Excellent control. Sodium restriction is reinforced.  Holli Humbles, MD, [place] [phone] office [phone] pager

## 2014-06-15 NOTE — Patient Instructions (Signed)
Dr. Croitoru recommends that you schedule a follow-up appointment in: ONE YEAR   

## 2014-06-16 ENCOUNTER — Encounter: Payer: Medicare Other | Admitting: Internal Medicine

## 2014-06-17 ENCOUNTER — Ambulatory Visit
Admission: RE | Admit: 2014-06-17 | Discharge: 2014-06-17 | Disposition: A | Discharge status: Home / Self Care | Payer: Medicare Other | Source: Ambulatory Visit | Attending: Family Medicine | Admitting: Family Medicine

## 2014-06-17 DIAGNOSIS — R52 Pain, unspecified: Secondary | ICD-10-CM

## 2014-06-18 ENCOUNTER — Telehealth: Payer: Self-pay | Admitting: Internal Medicine

## 2014-06-18 NOTE — Telephone Encounter (Signed)
error 

## 2014-06-20 ENCOUNTER — Other Ambulatory Visit: Payer: Self-pay | Admitting: Cardiovascular Disease

## 2014-06-22 ENCOUNTER — Other Ambulatory Visit: Payer: Self-pay

## 2014-06-22 MED ORDER — FUROSEMIDE 40 MG PO TABS: 40.0000 mg | ORAL_TABLET | Freq: Every day | ORAL | Status: DC | PRN

## 2014-06-22 NOTE — Telephone Encounter (Signed)
Rx was sent to pharmacy electronically. 

## 2014-07-08 ENCOUNTER — Other Ambulatory Visit: Payer: Self-pay | Admitting: Cardiovascular Disease

## 2014-07-08 NOTE — Telephone Encounter (Signed)
Rx was sent to pharmacy electronically. 

## 2014-07-13 ENCOUNTER — Ambulatory Visit: Payer: Medicare Other | Admitting: Pharmacist Clinician (PhC)/ Clinical Pharmacy Specialist

## 2014-07-17 ENCOUNTER — Ambulatory Visit (INDEPENDENT_AMBULATORY_CARE_PROVIDER_SITE_OTHER): Payer: Medicare Other | Admitting: Pharmacist Clinician (PhC)/ Clinical Pharmacy Specialist

## 2014-07-17 VITALS — BP 112/64 | HR 60

## 2014-07-17 DIAGNOSIS — Z7901 Long term (current) use of anticoagulants: Secondary | ICD-10-CM

## 2014-07-17 DIAGNOSIS — I639 Cerebral infarction, unspecified: Secondary | ICD-10-CM

## 2014-07-17 DIAGNOSIS — I4891 Unspecified atrial fibrillation: Secondary | ICD-10-CM

## 2014-07-17 DIAGNOSIS — I6350 Cerebral infarction due to unspecified occlusion or stenosis of unspecified cerebral artery: Secondary | ICD-10-CM

## 2014-07-17 DIAGNOSIS — I480 Paroxysmal atrial fibrillation: Secondary | ICD-10-CM

## 2014-07-17 LAB — POCT INR: INR: 2.3

## 2014-07-29 ENCOUNTER — Ambulatory Visit (INDEPENDENT_AMBULATORY_CARE_PROVIDER_SITE_OTHER): Payer: Medicare Other | Admitting: *Deleted

## 2014-07-29 ENCOUNTER — Encounter: Payer: Self-pay | Admitting: Internal Medicine

## 2014-07-29 DIAGNOSIS — I5022 Chronic systolic (congestive) heart failure: Secondary | ICD-10-CM

## 2014-07-29 DIAGNOSIS — Z95810 Presence of automatic (implantable) cardiac defibrillator: Secondary | ICD-10-CM

## 2014-07-29 DIAGNOSIS — I509 Heart failure, unspecified: Secondary | ICD-10-CM

## 2014-07-30 ENCOUNTER — Telehealth: Payer: Self-pay | Admitting: Internal Medicine

## 2014-07-30 NOTE — Telephone Encounter (Signed)
New message ° ° ° ° ° °Did you get her remote transmission from yesterday? °

## 2014-07-30 NOTE — Progress Notes (Signed)
Remote ICD transmission.   

## 2014-07-30 NOTE — Telephone Encounter (Signed)
Patient informed that remote was received. 

## 2014-07-31 LAB — MDC_IDC_ENUM_SESS_TYPE_REMOTE
Battery Remaining Longevity: 56 mo
Brady Statistic AS VS Percent: 1.3 %
Date Time Interrogation Session: 20150930072207
HighPow Impedance: 45 Ohm
Implantable Pulse Generator Model: 3257
Implantable Pulse Generator Serial Number: 7027976
Lead Channel Impedance Value: 300 Ohm
Lead Channel Impedance Value: 680 Ohm
Lead Channel Pacing Threshold Amplitude: 0.625 V
Lead Channel Pacing Threshold Amplitude: 0.75 V
Lead Channel Pacing Threshold Pulse Width: 0.5 ms
Lead Channel Pacing Threshold Pulse Width: 0.8 ms
Lead Channel Sensing Intrinsic Amplitude: 3.2 mV
Lead Channel Sensing Intrinsic Amplitude: 5 mV
Lead Channel Setting Pacing Amplitude: 1.5 V
Lead Channel Setting Pacing Pulse Width: 0.5 ms
Lead Channel Setting Pacing Pulse Width: 0.8 ms
MDC IDC MSMT BATTERY REMAINING PERCENTAGE: 66 %
MDC IDC MSMT BATTERY VOLTAGE: 2.95 V
MDC IDC MSMT LEADCHNL RA PACING THRESHOLD AMPLITUDE: 0.5 V
MDC IDC MSMT LEADCHNL RA PACING THRESHOLD PULSEWIDTH: 0.5 ms
MDC IDC MSMT LEADCHNL RV IMPEDANCE VALUE: 390 Ohm
MDC IDC SET LEADCHNL LV PACING AMPLITUDE: 1.75 V
MDC IDC SET LEADCHNL RV PACING AMPLITUDE: 2 V
MDC IDC SET LEADCHNL RV SENSING SENSITIVITY: 0.5 mV
MDC IDC STAT BRADY AP VP PERCENT: 3.5 %
MDC IDC STAT BRADY AP VS PERCENT: 1 %
MDC IDC STAT BRADY AS VP PERCENT: 94 %
MDC IDC STAT BRADY RA PERCENT PACED: 3.4 %

## 2014-08-14 ENCOUNTER — Encounter: Payer: Self-pay | Admitting: Cardiology

## 2014-08-25 ENCOUNTER — Other Ambulatory Visit: Payer: Self-pay | Admitting: *Deleted

## 2014-08-25 MED ORDER — SPIRONOLACTONE 25 MG PO TABS: 25.0000 mg | ORAL_TABLET | Freq: Every day | ORAL | Status: DC

## 2014-08-26 ENCOUNTER — Ambulatory Visit (INDEPENDENT_AMBULATORY_CARE_PROVIDER_SITE_OTHER): Payer: Medicare Other | Admitting: Pharmacist Clinician (PhC)/ Clinical Pharmacy Specialist

## 2014-08-26 VITALS — BP 136/68 | HR 80

## 2014-08-26 DIAGNOSIS — I4891 Unspecified atrial fibrillation: Secondary | ICD-10-CM

## 2014-08-26 DIAGNOSIS — I639 Cerebral infarction, unspecified: Secondary | ICD-10-CM

## 2014-08-26 DIAGNOSIS — Z7901 Long term (current) use of anticoagulants: Secondary | ICD-10-CM

## 2014-08-26 LAB — POCT INR: INR: 1.6

## 2014-08-28 ENCOUNTER — Ambulatory Visit: Payer: Medicare Other | Admitting: Pharmacist Clinician (PhC)/ Clinical Pharmacy Specialist

## 2014-08-31 ENCOUNTER — Encounter: Payer: Self-pay | Admitting: Cardiovascular Disease

## 2014-09-16 ENCOUNTER — Ambulatory Visit (INDEPENDENT_AMBULATORY_CARE_PROVIDER_SITE_OTHER): Payer: Medicare Other | Admitting: Pharmacist Clinician (PhC)/ Clinical Pharmacy Specialist

## 2014-09-16 DIAGNOSIS — I4891 Unspecified atrial fibrillation: Secondary | ICD-10-CM

## 2014-09-16 DIAGNOSIS — I639 Cerebral infarction, unspecified: Secondary | ICD-10-CM

## 2014-09-16 DIAGNOSIS — Z7901 Long term (current) use of anticoagulants: Secondary | ICD-10-CM

## 2014-09-16 LAB — POCT INR: INR: 2.6

## 2014-10-08 ENCOUNTER — Encounter (HOSPITAL_COMMUNITY): Payer: Self-pay | Admitting: Internal Medicine

## 2014-10-14 ENCOUNTER — Ambulatory Visit (INDEPENDENT_AMBULATORY_CARE_PROVIDER_SITE_OTHER): Payer: Medicare Other | Admitting: Pharmacist Clinician (PhC)/ Clinical Pharmacy Specialist

## 2014-10-14 DIAGNOSIS — Z7901 Long term (current) use of anticoagulants: Secondary | ICD-10-CM

## 2014-10-14 DIAGNOSIS — I639 Cerebral infarction, unspecified: Secondary | ICD-10-CM

## 2014-10-14 DIAGNOSIS — I4891 Unspecified atrial fibrillation: Secondary | ICD-10-CM

## 2014-10-14 LAB — POCT INR: INR: 2.2

## 2014-11-03 ENCOUNTER — Ambulatory Visit (INDEPENDENT_AMBULATORY_CARE_PROVIDER_SITE_OTHER): Payer: Medicare Other | Admitting: *Deleted

## 2014-11-03 DIAGNOSIS — I5022 Chronic systolic (congestive) heart failure: Secondary | ICD-10-CM

## 2014-11-03 DIAGNOSIS — Z95810 Presence of automatic (implantable) cardiac defibrillator: Secondary | ICD-10-CM

## 2014-11-03 NOTE — Progress Notes (Signed)
Remote ICD transmission.   

## 2014-11-05 ENCOUNTER — Telehealth: Payer: Self-pay | Admitting: Internal Medicine

## 2014-11-05 LAB — MDC_IDC_ENUM_SESS_TYPE_REMOTE
Battery Remaining Percentage: 63 %
Battery Voltage: 2.95 V
Brady Statistic AP VP Percent: 3.2 %
Brady Statistic AS VP Percent: 94 %
Brady Statistic RA Percent Paced: 3.1 %
Brady Statistic RV Percent Paced: 97 %
Date Time Interrogation Session: 20160105090017
HIGH POWER IMPEDANCE MEASURED VALUE: 51 Ohm
Implantable Pulse Generator Model: 3257
Implantable Pulse Generator Serial Number: 7027976
Lead Channel Impedance Value: 340 Ohm
Lead Channel Impedance Value: 510 Ohm
Lead Channel Impedance Value: 700 Ohm
Lead Channel Pacing Threshold Amplitude: 0.5 V
Lead Channel Pacing Threshold Pulse Width: 0.5 ms
Lead Channel Pacing Threshold Pulse Width: 0.8 ms
Lead Channel Sensing Intrinsic Amplitude: 5 mV
Lead Channel Setting Pacing Amplitude: 2 V
Lead Channel Setting Pacing Pulse Width: 0.5 ms
MDC IDC MSMT BATTERY REMAINING LONGEVITY: 55 mo
MDC IDC MSMT LEADCHNL LV PACING THRESHOLD AMPLITUDE: 0.75 V
MDC IDC MSMT LEADCHNL RV PACING THRESHOLD AMPLITUDE: 0.625 V
MDC IDC MSMT LEADCHNL RV PACING THRESHOLD PULSEWIDTH: 0.5 ms
MDC IDC MSMT LEADCHNL RV SENSING INTR AMPL: 11.5 mV
MDC IDC SET LEADCHNL LV PACING AMPLITUDE: 1.75 V
MDC IDC SET LEADCHNL LV PACING PULSEWIDTH: 0.8 ms
MDC IDC SET LEADCHNL RA PACING AMPLITUDE: 1.5 V
MDC IDC SET LEADCHNL RV SENSING SENSITIVITY: 0.5 mV
MDC IDC STAT BRADY AP VS PERCENT: 1 %
MDC IDC STAT BRADY AS VS PERCENT: 1.7 %

## 2014-11-05 NOTE — Telephone Encounter (Signed)
New message      Did you get her remote transmission from yesterday?

## 2014-11-05 NOTE — Telephone Encounter (Signed)
Informed pt that transmission.

## 2014-11-11 ENCOUNTER — Ambulatory Visit (INDEPENDENT_AMBULATORY_CARE_PROVIDER_SITE_OTHER): Payer: Medicare Other | Admitting: Pharmacist Clinician (PhC)/ Clinical Pharmacy Specialist

## 2014-11-11 DIAGNOSIS — I4891 Unspecified atrial fibrillation: Secondary | ICD-10-CM

## 2014-11-11 DIAGNOSIS — Z7901 Long term (current) use of anticoagulants: Secondary | ICD-10-CM

## 2014-11-11 DIAGNOSIS — I639 Cerebral infarction, unspecified: Secondary | ICD-10-CM

## 2014-11-11 LAB — POCT INR: INR: 1.9

## 2014-11-16 ENCOUNTER — Encounter: Payer: Self-pay | Admitting: Cardiology

## 2014-11-30 ENCOUNTER — Encounter: Payer: Self-pay | Admitting: Internal Medicine

## 2014-12-21 ENCOUNTER — Ambulatory Visit
Admission: RE | Admit: 2014-12-21 | Discharge: 2014-12-21 | Disposition: A | Discharge status: Home / Self Care | Payer: Medicare Other | Source: Ambulatory Visit | Attending: Family Medicine | Admitting: Family Medicine

## 2014-12-21 ENCOUNTER — Other Ambulatory Visit: Payer: Self-pay | Admitting: Family Medicine

## 2014-12-21 DIAGNOSIS — R079 Chest pain, unspecified: Secondary | ICD-10-CM

## 2014-12-22 ENCOUNTER — Other Ambulatory Visit: Payer: Self-pay | Admitting: Family Medicine

## 2014-12-22 DIAGNOSIS — R1084 Generalized abdominal pain: Secondary | ICD-10-CM

## 2014-12-22 DIAGNOSIS — Z853 Personal history of malignant neoplasm of breast: Secondary | ICD-10-CM

## 2014-12-23 ENCOUNTER — Ambulatory Visit
Admission: RE | Admit: 2014-12-23 | Discharge: 2014-12-23 | Disposition: A | Discharge status: Home / Self Care | Payer: Medicare Other | Source: Ambulatory Visit | Attending: Family Medicine | Admitting: Family Medicine

## 2014-12-23 ENCOUNTER — Ambulatory Visit (INDEPENDENT_AMBULATORY_CARE_PROVIDER_SITE_OTHER): Payer: Medicare Other | Admitting: Pharmacist Clinician (PhC)/ Clinical Pharmacy Specialist

## 2014-12-23 DIAGNOSIS — I639 Cerebral infarction, unspecified: Secondary | ICD-10-CM

## 2014-12-23 DIAGNOSIS — Z853 Personal history of malignant neoplasm of breast: Secondary | ICD-10-CM

## 2014-12-23 DIAGNOSIS — Z7901 Long term (current) use of anticoagulants: Secondary | ICD-10-CM

## 2014-12-23 DIAGNOSIS — I4891 Unspecified atrial fibrillation: Secondary | ICD-10-CM

## 2014-12-23 DIAGNOSIS — R1084 Generalized abdominal pain: Secondary | ICD-10-CM

## 2014-12-23 LAB — POCT INR: INR: 2.8

## 2014-12-23 MED ORDER — IOHEXOL 300 MG/ML  SOLN: 100.0000 mL | Freq: Once | INTRAMUSCULAR | Status: AC | PRN

## 2015-01-12 ENCOUNTER — Other Ambulatory Visit: Payer: Self-pay | Admitting: *Deleted

## 2015-01-12 MED ORDER — POTASSIUM CHLORIDE CRYS ER 20 MEQ PO TBCR: 20.0000 meq | EXTENDED_RELEASE_TABLET | Freq: Every day | ORAL | Status: DC | PRN

## 2015-01-13 ENCOUNTER — Other Ambulatory Visit: Payer: Self-pay | Admitting: Pharmacist Clinician (PhC)/ Clinical Pharmacy Specialist

## 2015-01-13 MED ORDER — WARFARIN SODIUM 4 MG PO TABS: ORAL_TABLET | ORAL | Status: DC

## 2015-02-03 ENCOUNTER — Ambulatory Visit (INDEPENDENT_AMBULATORY_CARE_PROVIDER_SITE_OTHER): Payer: Medicare Other | Admitting: Pharmacist Clinician (PhC)/ Clinical Pharmacy Specialist

## 2015-02-03 DIAGNOSIS — I4891 Unspecified atrial fibrillation: Secondary | ICD-10-CM | POA: Diagnosis not present

## 2015-02-03 DIAGNOSIS — I639 Cerebral infarction, unspecified: Secondary | ICD-10-CM | POA: Diagnosis not present

## 2015-02-03 DIAGNOSIS — Z7901 Long term (current) use of anticoagulants: Secondary | ICD-10-CM

## 2015-02-03 LAB — POCT INR: INR: 1.8

## 2015-02-04 ENCOUNTER — Encounter: Payer: Self-pay | Admitting: Internal Medicine

## 2015-02-04 ENCOUNTER — Ambulatory Visit (INDEPENDENT_AMBULATORY_CARE_PROVIDER_SITE_OTHER): Payer: Medicare Other | Admitting: *Deleted

## 2015-02-04 DIAGNOSIS — I5022 Chronic systolic (congestive) heart failure: Secondary | ICD-10-CM | POA: Diagnosis not present

## 2015-02-04 DIAGNOSIS — Z95810 Presence of automatic (implantable) cardiac defibrillator: Secondary | ICD-10-CM

## 2015-02-04 LAB — MDC_IDC_ENUM_SESS_TYPE_REMOTE
Battery Remaining Longevity: 52 mo
Battery Voltage: 2.93 V
Brady Statistic AP VP Percent: 2.6 %
Brady Statistic RA Percent Paced: 2.6 %
Date Time Interrogation Session: 20160407082343
HIGH POWER IMPEDANCE MEASURED VALUE: 48 Ohm
Implantable Pulse Generator Model: 3257
Implantable Pulse Generator Serial Number: 7027976
Lead Channel Impedance Value: 510 Ohm
Lead Channel Impedance Value: 740 Ohm
Lead Channel Pacing Threshold Amplitude: 0.5 V
Lead Channel Pacing Threshold Amplitude: 0.625 V
Lead Channel Pacing Threshold Amplitude: 0.75 V
Lead Channel Pacing Threshold Pulse Width: 0.5 ms
Lead Channel Setting Pacing Amplitude: 2 V
Lead Channel Setting Sensing Sensitivity: 0.5 mV
MDC IDC MSMT BATTERY REMAINING PERCENTAGE: 60 %
MDC IDC MSMT LEADCHNL LV PACING THRESHOLD PULSEWIDTH: 0.8 ms
MDC IDC MSMT LEADCHNL RA IMPEDANCE VALUE: 330 Ohm
MDC IDC MSMT LEADCHNL RA SENSING INTR AMPL: 5 mV
MDC IDC MSMT LEADCHNL RV PACING THRESHOLD PULSEWIDTH: 0.5 ms
MDC IDC MSMT LEADCHNL RV SENSING INTR AMPL: 2.5 mV
MDC IDC SET LEADCHNL LV PACING AMPLITUDE: 1.75 V
MDC IDC SET LEADCHNL LV PACING PULSEWIDTH: 0.8 ms
MDC IDC SET LEADCHNL RA PACING AMPLITUDE: 1.5 V
MDC IDC SET LEADCHNL RV PACING PULSEWIDTH: 0.5 ms
MDC IDC STAT BRADY AP VS PERCENT: 1 %
MDC IDC STAT BRADY AS VP PERCENT: 94 %
MDC IDC STAT BRADY AS VS PERCENT: 1.8 %

## 2015-02-04 NOTE — Progress Notes (Signed)
Remote ICD transmission.   

## 2015-02-05 ENCOUNTER — Telehealth: Payer: Self-pay | Admitting: Internal Medicine

## 2015-02-05 NOTE — Telephone Encounter (Signed)
Pt calling to see if transmission went through--pls call [phone]

## 2015-02-05 NOTE — Telephone Encounter (Signed)
Informed pt that transmission was received.  

## 2015-02-22 ENCOUNTER — Encounter: Payer: Self-pay | Admitting: Cardiology

## 2015-03-08 ENCOUNTER — Other Ambulatory Visit: Payer: Self-pay | Admitting: Cardiovascular Disease

## 2015-03-18 ENCOUNTER — Ambulatory Visit: Payer: Medicare Other | Admitting: Pharmacist Clinician (PhC)/ Clinical Pharmacy Specialist

## 2015-04-01 ENCOUNTER — Ambulatory Visit: Payer: Medicare Other | Admitting: Pharmacist Clinician (PhC)/ Clinical Pharmacy Specialist

## 2015-04-08 ENCOUNTER — Ambulatory Visit (INDEPENDENT_AMBULATORY_CARE_PROVIDER_SITE_OTHER): Payer: Medicare Other | Admitting: Pharmacist Clinician (PhC)/ Clinical Pharmacy Specialist

## 2015-04-08 DIAGNOSIS — I639 Cerebral infarction, unspecified: Secondary | ICD-10-CM

## 2015-04-08 DIAGNOSIS — I4891 Unspecified atrial fibrillation: Secondary | ICD-10-CM | POA: Diagnosis not present

## 2015-04-08 DIAGNOSIS — Z7901 Long term (current) use of anticoagulants: Secondary | ICD-10-CM | POA: Diagnosis not present

## 2015-04-08 LAB — POCT INR: INR: 1.4

## 2015-04-14 ENCOUNTER — Emergency Department (HOSPITAL_COMMUNITY)
Admission: EM | Admit: 2015-04-14 | Discharge: 2015-04-14 | Disposition: A | Discharge status: Home / Self Care | Payer: Medicare Other | Attending: Emergency Medicine | Admitting: Emergency Medicine

## 2015-04-14 ENCOUNTER — Emergency Department (HOSPITAL_COMMUNITY): Payer: Medicare Other

## 2015-04-14 ENCOUNTER — Encounter (HOSPITAL_COMMUNITY): Payer: Self-pay | Admitting: Emergency Medicine

## 2015-04-14 DIAGNOSIS — I10 Essential (primary) hypertension: Secondary | ICD-10-CM | POA: Insufficient documentation

## 2015-04-14 DIAGNOSIS — I509 Heart failure, unspecified: Secondary | ICD-10-CM | POA: Diagnosis not present

## 2015-04-14 DIAGNOSIS — Z79899 Other long term (current) drug therapy: Secondary | ICD-10-CM | POA: Diagnosis not present

## 2015-04-14 DIAGNOSIS — Z7901 Long term (current) use of anticoagulants: Secondary | ICD-10-CM | POA: Diagnosis not present

## 2015-04-14 DIAGNOSIS — Z7951 Long term (current) use of inhaled steroids: Secondary | ICD-10-CM | POA: Diagnosis not present

## 2015-04-14 DIAGNOSIS — Z853 Personal history of malignant neoplasm of breast: Secondary | ICD-10-CM | POA: Insufficient documentation

## 2015-04-14 DIAGNOSIS — Z8673 Personal history of transient ischemic attack (TIA), and cerebral infarction without residual deficits: Secondary | ICD-10-CM | POA: Insufficient documentation

## 2015-04-14 DIAGNOSIS — Z880 Allergy status to penicillin: Secondary | ICD-10-CM | POA: Diagnosis not present

## 2015-04-14 DIAGNOSIS — R079 Chest pain, unspecified: Secondary | ICD-10-CM | POA: Diagnosis not present

## 2015-04-14 DIAGNOSIS — Z86018 Personal history of other benign neoplasm: Secondary | ICD-10-CM | POA: Insufficient documentation

## 2015-04-14 DIAGNOSIS — Z95810 Presence of automatic (implantable) cardiac defibrillator: Secondary | ICD-10-CM | POA: Insufficient documentation

## 2015-04-14 LAB — BASIC METABOLIC PANEL
Anion gap: 8 (ref 5–15)
BUN: 9 mg/dL (ref 6–20)
CO2: 25 mmol/L (ref 22–32)
Calcium: 9.7 mg/dL (ref 8.9–10.3)
Chloride: 105 mmol/L (ref 101–111)
Creatinine, Ser: 0.7 mg/dL (ref 0.44–1.00)
GFR calc Af Amer: >60 mL/min (ref >60)
GFR calc non Af Amer: >60 mL/min (ref >60)
GLUCOSE: 110 mg/dL — AB (ref 65–99)
POTASSIUM: 4.1 mmol/L (ref 3.5–5.1)
SODIUM: 138 mmol/L (ref 135–145)

## 2015-04-14 LAB — CBC
HEMATOCRIT: 36.3 % (ref 36.0–46.0)
HEMOGLOBIN: 12.4 g/dL (ref 12.0–15.0)
MCH: 26.1 pg (ref 26.0–34.0)
MCHC: 34.2 g/dL (ref 30.0–36.0)
MCV: 76.4 fL — AB (ref 78.0–100.0)
Platelets: 214 10*3/uL (ref 150–400)
RBC: 4.75 MIL/uL (ref 3.87–5.11)
RDW: 15.4 % (ref 11.5–15.5)
WBC: 5 10*3/uL (ref 4.0–10.5)

## 2015-04-14 LAB — I-STAT TROPONIN, ED: Troponin i, poc: 0 ng/mL (ref 0.00–0.08)

## 2015-04-14 MED ORDER — ASPIRIN 81 MG PO CHEW
324.0000 mg | CHEWABLE_TABLET | Freq: Once | ORAL | Status: AC
  Administered 2015-04-14: 324 mg via ORAL
  Filled 2015-04-14: qty 4

## 2015-04-14 NOTE — ED Notes (Signed)
Called and spoke with Darci Current, RN to interrogate [employer] pacemaker and defibrillator.

## 2015-04-14 NOTE — Discharge Instructions (Signed)
Chest Pain (Nonspecific) Follow up with your primary care provider. Return for chest pain, shortness of breath, light headedness, or palpitations.  It is often hard to give a specific diagnosis for the cause of chest pain. There is always a chance that your pain could be related to something serious, such as a heart attack or a blood clot in the lungs. You need to follow up with your health care provider for further evaluation. CAUSES   Heartburn.  Pneumonia or bronchitis.  Anxiety or stress.  Inflammation around your heart (pericarditis) or lung (pleuritis or pleurisy).  A blood clot in the lung.  A collapsed lung (pneumothorax). It can develop suddenly on its own (spontaneous pneumothorax) or from trauma to the chest.  Shingles infection (herpes zoster virus). The chest wall is composed of bones, muscles, and cartilage. Any of these can be the source of the pain.  The bones can be bruised by injury.  The muscles or cartilage can be strained by coughing or overwork.  The cartilage can be affected by inflammation and become sore (costochondritis). DIAGNOSIS  Lab tests or other studies may be needed to find the cause of your pain. Your health care provider may have you take a test called an ambulatory electrocardiogram (ECG). An ECG records your heartbeat patterns over a 24-hour period. You may also have other tests, such as:  Transthoracic echocardiogram (TTE). During echocardiography, sound waves are used to evaluate how blood flows through your heart.  Transesophageal echocardiogram (TEE).  Cardiac monitoring. This allows your health care provider to monitor your heart rate and rhythm in real time.  Holter monitor. This is a portable device that records your heartbeat and can help diagnose heart arrhythmias. It allows your health care provider to track your heart activity for several days, if needed.  Stress tests by exercise or by giving medicine that makes the heart beat  faster. TREATMENT   Treatment depends on what may be causing your chest pain. Treatment may include:  Acid blockers for heartburn.  Anti-inflammatory medicine.  Pain medicine for inflammatory conditions.  Antibiotics if an infection is present.  You may be advised to change lifestyle habits. This includes stopping smoking and avoiding alcohol, caffeine, and chocolate.  You may be advised to keep your head raised (elevated) when sleeping. This reduces the chance of acid going backward from your stomach into your esophagus. Most of the time, nonspecific chest pain will improve within 2-3 days with rest and mild pain medicine.  HOME CARE INSTRUCTIONS   If antibiotics were prescribed, take them as directed. Finish them even if you start to feel better.  For the next few days, avoid physical activities that bring on chest pain. Continue physical activities as directed.  Do not use any tobacco products, including cigarettes, chewing tobacco, or electronic cigarettes.  Avoid drinking alcohol.  Only take medicine as directed by your health care provider.  Follow your health care provider's suggestions for further testing if your chest pain does not go away.  Keep any follow-up appointments you made. If you do not go to an appointment, you could develop lasting (chronic) problems with pain. If there is any problem keeping an appointment, call to reschedule. SEEK MEDICAL CARE IF:   Your chest pain does not go away, even after treatment.  You have a rash with blisters on your chest.  You have a fever. SEEK IMMEDIATE MEDICAL CARE IF:   You have increased chest pain or pain that spreads to your arm, neck,  jaw, back, or abdomen.  You have shortness of breath.  You have an increasing cough, or you cough up blood.  You have severe back or abdominal pain.  You feel nauseous or vomit.  You have severe weakness.  You faint.  You have chills. This is an emergency. Do not wait to  see if the pain will go away. Get medical help at once. Call your local emergency services (911 in [place] Do not drive yourself to the hospital. MAKE SURE YOU:   Understand these instructions.  Will watch your condition.  Will get help right away if you are not doing well or get worse. Document Released: 07/26/2005 Document Revised: 10/21/2013 Document Reviewed: 05/21/2008 [facility] Patient Information 2015 [place], [place]. This information is not intended to replace advice given to you by your health care provider. Make sure you discuss any questions you have with your health care provider.

## 2015-04-14 NOTE — ED Notes (Signed)
Pt is in stable condition upon d/c and is escorted from ED via wheelchair. 

## 2015-04-14 NOTE — ED Provider Notes (Signed)
CSN: [id number]     Arrival date & time 04/14/15  1107 History   First MD Initiated Contact with Patient 04/14/15 1157     Chief Complaint  Patient presents with  . Chest Pain     (Consider location/radiation/quality/duration/timing/severity/associated sxs/prior Treatment) Patient is a 64 y.o. female presenting with chest pain. The history is provided by the patient. No language interpreter was used.  Chest Pain Associated symptoms: no dizziness, no palpitations and no weakness   Danielle Mckinney is a 64 y.o female with a history of atrial fibrillation (anticoagulated with warfarin), CHF, hypertension, stroke, breast cancer in 1998 who presents for chest pain that began yesterday. She states that she thought was indigestion and it resolved on its own. Chest pain returned this morning and has been intermittent in the center of her chest lasting 1 minute and radiating below bilateral breast. She describes the pain as a tightness. It is not worse with inspiration or movement. She denies any estrogen and use. No prior history of PE or DVT. She states that she does not have chest pain now. She denies smoking history. She denies any fever, chills, dizziness, near syncope, shortness of breath, cough, nausea, vomiting, dysuria, constipation, leg swelling.  Past Medical History  Diagnosis Date  . Atrial fibrillation   . CHF (congestive heart failure)   . HTN (hypertension)   . Stroke   . CIN I (cervical intraepithelial neoplasia I)   . Cancer     Breast cancer-BRACA I gene  . Fibroid   . 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 Biventricular ICD (implantable cardioverter-defibrillator) in place     [facility]   Past Surgical History  Procedure Laterality Date  . Biv icd genertaor change out  12/20/2011    [facility]  . Mastectomy    . Transthoracic echocardiogram  08/14/06  . Gynecologic cryosurgery    . Colposcopy    . Tubal ligation    . Breast surgery      Bilateral  mastctomy and reconstruction TRAM  . Cervical cone biopsy    . Cardiac defibrillator placement    . Oophorectomy      BSO  . US echocardiography  06/25/2012    borderline concentric LVH,LV systolic fx mildly reduced,impaired LV relaxation  . Biv icd genertaor change out N/A 12/20/2011    Procedure: BIV ICD GENERTAOR CHANGE OUT;  Surgeon: Evans Lance, MD;  Location: [facility] CATH LAB;  Service: Cardiovascular;  Laterality: N/A;   Family History  Problem Relation Age of Onset  . Coronary artery disease Other   . Breast cancer Sister     Age 20  . Hypertension Sister   . Hypertension Brother   . Heart disease Maternal Grandfather    History  Substance Use Topics  . Smoking status: Never Smoker   . Smokeless tobacco: Never Used  . Alcohol Use: No   OB History    Gravida Para Term Preterm AB TAB SAB Ectopic Multiple Living   3 2 2  1     2      Review of Systems  Cardiovascular: Positive for chest pain. Negative for palpitations and leg swelling.  Neurological: Negative for dizziness, weakness and light-headedness.  All other systems reviewed and are negative.     Allergies  Codeine; Lisinopril; Penicillins; and Sulfa antibiotics  Home Medications   Prior to Admission medications   Medication Sig Start Date End Date Taking? Authorizing Provider  carvedilol (COREG) 6.25 MG tablet Take  1.5 tablets (9.375 mg total) by mouth 2 (two) times daily with a meal. 07/08/14  Yes Mihai Croitoru, MD  Cholecalciferol (D3-1000) 1000 UNITS tablet Take 1,000 Units by mouth daily.   Yes Historical Provider, MD  fish oil-omega-3 fatty acids 1000 MG capsule Take 2 g by mouth daily.     Yes Historical Provider, MD  fluticasone (FLONASE) 50 MCG/ACT nasal spray Place 2 sprays into the nose as needed. 08/16/13  Yes Freeman Caldron Baker, PA-C  furosemide (LASIX) 40 MG tablet Take 1 tablet (40 mg total) by mouth daily as needed. For swelling 06/22/14  Yes Mihai Croitoru, MD  losartan (COZAAR) 100 MG tablet take  1 tablet by mouth once daily Patient taking differently: take 50 mg  by mouth twice daily 06/22/14  Yes Mihai Croitoru, MD  pantoprazole (PROTONIX) 40 MG tablet Take 40 mg by mouth as needed. 06/09/13  Yes Mihai Croitoru, MD  potassium chloride SA (K-DUR,KLOR-CON) 20 MEQ tablet Take 1 tablet (20 mEq total) by mouth daily as needed. Takes along with Furosemide when needed. 01/12/15  Yes Troy Sine, MD  spironolactone (ALDACTONE) 25 MG tablet take 1 tablet by mouth once daily 03/08/15  Yes Mihai Croitoru, MD  temazepam (RESTORIL) 15 MG capsule Take 15 mg by mouth at bedtime as needed. For sleep   Yes Historical Provider, MD  warfarin (COUMADIN) 4 MG tablet TAKE 1 AND 1/2 TABLETS BY MOUTH DAILY AS [place] BY COUMADIN CLINIC Patient taking differently: Take 4-6 mg by mouth daily. TAKE 1 AND 1/2 TABLETS BY MOUTH DAILY AS DIRECETD BY COUMADIN CLINIC  Mon and Fri 4 mg, and all other days 6 mg 01/13/15  Yes Mihai Croitoru, MD   BP 128/64 mmHg  Pulse 63  Temp(Src) 98.7 F (37.1 C) (Oral)  Resp 20  SpO2 99% Physical Exam  Constitutional: She is oriented to person, place, and time. She appears well-developed and well-nourished.  HENT:  Head: Normocephalic and atraumatic.  Eyes: Conjunctivae are normal.  Neck: Normal range of motion. Neck supple.  Cardiovascular: Normal rate, regular rhythm and normal heart sounds.   Pulmonary/Chest: Effort normal and breath sounds normal. No respiratory distress. She has no wheezes. She has no rales.  Left-sided pacemaker.  Abdominal: Soft. There is no tenderness. There is no rebound and no guarding.  Musculoskeletal: Normal range of motion. She exhibits no edema.  Neurological: She is alert and oriented to person, place, and time.  Skin: Skin is warm and dry.  Nursing note and vitals reviewed.   ED Course  Procedures (including critical care time) Labs Review Labs Reviewed  CBC - Abnormal; Notable for the following:    MCV 76.4 (*)    All other components  within normal limits  BASIC METABOLIC PANEL - Abnormal; Notable for the following:    Glucose, Bld 110 (*)    All other components within normal limits  I-STAT TROPOININ, ED    Imaging Review Dg Chest 2 View  04/14/2015   CLINICAL DATA:  Chest pain since last night.  History of CHF  EXAM: CHEST  2 VIEW  COMPARISON:  12/21/2014  FINDINGS: Stable biventricular pacer/ICD from the left.  Bilateral breast lumpectomy and axillary dissection.  Normal heart size and mediastinal contours. No acute infiltrate or edema. No effusion or pneumothorax. No acute osseous findings.  IMPRESSION: No active cardiopulmonary disease.   Electronically Signed   By: Monte Fantasia M.D.   On: 04/14/2015 12:53     EKG Interpretation   Date/Time:  Wednesday April 14 2015 11:14:00 EDT Ventricular Rate:  73 PR Interval:  162 QRS Duration: 128 QT Interval:  438 QTC Calculation: 482 R Axis:   95 Text Interpretation:  Atrial-sensed ventricular-paced rhythm Abnormal ECG  since last tracing no significant change Confirmed by Eulis Foster  MD, Vira Agar  ([id number]) on 04/14/2015 4:22:32 PM      MDM   Final diagnoses:  Chest pain, unspecified chest pain type  Patient with history of A. fib, hypertension, pacemaker who presents for atypical chest pain. Currently having no chest pain while in the ED. She was given aspirin 324 mg. Her vitals are stable, troponin negative, and EKG is not concerning. CXR negative for pneumonia, edema, or pneumothorax.  Labs are unremarkable. I reviewed that heart score and patient is at low risk for major cardiac event. After several attempts by nursing staff, Interrogation of pacemaker was normal. I discussed this patient with Dr. Eulis Foster who agrees that the patient can be discharged with pcp follow up. I gave the patient strict return precautions such as chest pain, shortness of breath, dizziness, near syncope. Patient verbally agrees with the plan.     Ottie Glazier, PA-C 04/14/15 1814  Daleen Bo, MD 04/15/15 [phone]

## 2015-04-14 NOTE — ED Notes (Signed)
Sandy from [place] called and states interrogation was WNL.

## 2015-04-14 NOTE — ED Notes (Signed)
IV attempt unsuccessful. Danielle Mckinney, [place] made aware.

## 2015-04-14 NOTE — ED Provider Notes (Signed)
  Face-to-face evaluation   History: She reports onset of intermittent chest discomfort today, the discomfort lasts about 1 minute and feels like a burning sensation. No associated diaphoresis or shortness of breath. No near syncope.  Physical exam: Alert, calm, cooperative. Heart regular rhythm. No murmur. Lungs clear to auscultation. Abdomen soft, nontender to palpation.   Medical screening examination/treatment/procedure(s) were conducted as a shared visit with non-physician practitioner(s) and myself.  I personally evaluated the patient during the encounter  Daleen Bo, MD 04/15/15 [phone]

## 2015-04-16 ENCOUNTER — Ambulatory Visit (INDEPENDENT_AMBULATORY_CARE_PROVIDER_SITE_OTHER): Payer: Medicare Other | Admitting: Internal Medicine

## 2015-04-16 ENCOUNTER — Encounter: Payer: Self-pay | Admitting: Internal Medicine

## 2015-04-16 VITALS — BP 120/64 | HR 74 | Ht 62.5 in | Wt 180.4 lb

## 2015-04-16 DIAGNOSIS — I10 Essential (primary) hypertension: Secondary | ICD-10-CM | POA: Diagnosis not present

## 2015-04-16 DIAGNOSIS — I5022 Chronic systolic (congestive) heart failure: Secondary | ICD-10-CM | POA: Diagnosis not present

## 2015-04-16 DIAGNOSIS — Z95810 Presence of automatic (implantable) cardiac defibrillator: Secondary | ICD-10-CM

## 2015-04-16 LAB — CUP PACEART INCLINIC DEVICE CHECK
Battery Remaining Longevity: 49.2 mo
Brady Statistic RA Percent Paced: 2.7 %
Brady Statistic RV Percent Paced: 96 %
HighPow Impedance: 49.145
Lead Channel Impedance Value: 362.5 Ohm
Lead Channel Impedance Value: 675 Ohm
Lead Channel Pacing Threshold Amplitude: 0.5 V
Lead Channel Pacing Threshold Amplitude: 0.75 V
Lead Channel Pacing Threshold Pulse Width: 0.5 ms
Lead Channel Pacing Threshold Pulse Width: 0.8 ms
Lead Channel Sensing Intrinsic Amplitude: 5 mV
Lead Channel Setting Pacing Amplitude: 1.5 V
Lead Channel Setting Pacing Amplitude: 2 V
Lead Channel Setting Pacing Pulse Width: 0.5 ms
Lead Channel Setting Sensing Sensitivity: 0.5 mV
MDC IDC MSMT LEADCHNL RA PACING THRESHOLD PULSEWIDTH: 0.5 ms
MDC IDC MSMT LEADCHNL RV IMPEDANCE VALUE: 425 Ohm
MDC IDC MSMT LEADCHNL RV PACING THRESHOLD AMPLITUDE: 0.625 V
MDC IDC MSMT LEADCHNL RV SENSING INTR AMPL: 11.3 mV
MDC IDC PG MODEL: 3257
MDC IDC PG SERIAL: 7027976
MDC IDC SESS DTM: 20160617180347
MDC IDC SET LEADCHNL LV PACING AMPLITUDE: 1.75 V
MDC IDC SET LEADCHNL LV PACING PULSEWIDTH: 0.8 ms

## 2015-04-16 NOTE — Assessment & Plan Note (Signed)
Her LV function has normalized. She will continue her current meds.

## 2015-04-16 NOTE — Assessment & Plan Note (Signed)
Her device is working normally except for the noise on her RV lead. We will continue with programming as is with tachy therapies turned of.

## 2015-04-16 NOTE — Assessment & Plan Note (Signed)
Her blood pressure is well controlled. Will follow.  

## 2015-04-16 NOTE — Progress Notes (Signed)
HPI Danielle Mckinney returns today for followup. She is a pleasant 64 yo woman with an non-ischemic CM, S/P ICD implant, chronic systolic CHF, HTN, and chronic coumadin therapy. In the interim she has done well. She denies chest pain, shortness of breath, peripheral edema, syncope, or any ICD shocks. She remains active. No recent fevers or chills. . She has undergone echo to help guide our treatment. She has normalized LV function. She is not PPM dependent. Her ICD has been reprogrammed with all tachy therapies turned off. She is still BiV pacing.  Allergies  Allergen Reactions  . Codeine Nausea And Vomiting  . Lisinopril Cough  . Oxycodone Nausea And Vomiting  . Penicillins Nausea And Vomiting  . Sulfa Antibiotics Nausea And Vomiting     Current Outpatient Prescriptions  Medication Sig Dispense Refill  . carvedilol (COREG) 6.25 MG tablet Take 1.5 tablets (9.375 mg total) by mouth 2 (two) times daily with a meal. 90 tablet 11  . Cholecalciferol (D3-1000) 1000 UNITS tablet Take 1,000 Units by mouth daily.    . fish oil-omega-3 fatty acids 1000 MG capsule Take 1,000 mg by mouth daily.     . furosemide (LASIX) 40 MG tablet Take 1 tablet (40 mg total) by mouth daily as needed. For swelling 90 tablet 3  . losartan (COZAAR) 100 MG tablet Take 50 mg by mouth 2 (two) times daily.    . pantoprazole (PROTONIX) 40 MG tablet Take 40 mg by mouth daily as needed (heartburn or acid reflux).     . potassium chloride SA (K-DUR,KLOR-CON) 20 MEQ tablet Take 1 tablet (20 mEq total) by mouth daily as needed. Takes along with Furosemide when needed. 30 tablet 10  . spironolactone (ALDACTONE) 25 MG tablet take 1 tablet by mouth once daily 30 tablet 1  . temazepam (RESTORIL) 15 MG capsule Take 15 mg by mouth at bedtime as needed. For sleep    . warfarin (COUMADIN) 4 MG tablet TAKE 1 AND 1/2 TABLETS BY MOUTH DAILY AS DIRECETD BY COUMADIN CLINIC (Patient taking differently: Take 4-6 mg by mouth daily. TAKE 1 AND 1/2 TABLETS  BY MOUTH DAILY AS DIRECETD BY COUMADIN CLINIC  Mon and Fri 4 mg, and all other days 6 mg) 45 tablet 5  . fluticasone (FLONASE) 50 MCG/ACT nasal spray Place 2 sprays into the nose daily as needed for allergies or rhinitis.      No current facility-administered medications for this visit.     Past Medical History  Diagnosis Date  . Atrial fibrillation   . CHF (congestive heart failure)   . HTN (hypertension)   . Stroke   . CIN I (cervical intraepithelial neoplasia I)   . Cancer     Breast cancer-BRACA I gene  . Fibroid   . 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 Biventricular ICD (implantable cardioverter-defibrillator) in place     [facility]    ROS:   All systems reviewed and negative except as noted in the HPI.   Past Surgical History  Procedure Laterality Date  . Biv icd genertaor change out  12/20/2011    [facility]  . Mastectomy    . Transthoracic echocardiogram  08/14/06  . Gynecologic cryosurgery    . Colposcopy    . Tubal ligation    . Breast surgery      Bilateral mastctomy and reconstruction TRAM  . Cervical cone biopsy    . Cardiac defibrillator placement    . Oophorectomy  BSO  . US echocardiography  06/25/2012    borderline concentric LVH,LV systolic fx mildly reduced,impaired LV relaxation  . Biv icd genertaor change out N/A 12/20/2011    Procedure: BIV ICD GENERTAOR CHANGE OUT;  Surgeon: Evans Lance, MD;  Location: [facility] CATH LAB;  Service: Cardiovascular;  Laterality: N/A;     Family History  Problem Relation Age of Onset  . Coronary artery disease Other   . Breast cancer Sister     Age 71  . Hypertension Sister   . Hypertension Brother   . Heart disease Maternal Grandfather      History   Social History  . Marital Status: Divorced    Spouse Name: N/A  . Number of Children: N/A  . Years of Education: N/A   Occupational History  . Not on file.   Social History Main Topics  . Smoking status: Never  Smoker   . Smokeless tobacco: Never Used  . Alcohol Use: No  . Drug Use: No  . Sexual Activity: Yes    Birth Control/ Protection: Surgical   Other Topics Concern  . Not on file   Social History Narrative     BP 120/64 mmHg  Pulse 74  Ht 5' 2.5" (1.588 m)  Wt 180 lb 6.4 oz (81.829 kg)  BMI 32.45 kg/m2  Physical Exam:  Well appearing middle-aged woman, NAD HEENT: Unremarkable Neck:  6 cm JVD, no thyromegally Back:  No CVA tenderness Lungs:  Clear with no wheezes, rales, or rhonchi. HEART:  Regular rate rhythm, no murmurs, no rubs, no clicks Abd:  soft, positive bowel sounds, no organomegally, no rebound, no guarding Ext:  2 plus pulses, no edema, no cyanosis, no clubbing Skin:  No rashes no nodules Neuro:  CN II through XII intact, motor grossly intact  EKG normal sinus rhythm with bi-ventricular pacing  DEVICE  Normal device function except for the ventricular noise as noted.  See PaceArt for details. Her tachy therapies have been turned off in the setting of normalization of her LV function.  Assess/Plan:

## 2015-04-16 NOTE — Patient Instructions (Signed)
Medication Instructions:  Your physician recommends that you continue on your current medications as directed. Please refer to the Current Medication list given to you today.   Labwork: None ordered  Testing/Procedures: None ordered  Follow-Up: Your physician wants you to follow-up in: 12 months with Dr Knox Saliva will receive a reminder letter in the mail two months in advance. If you don't receive a letter, please call our office to schedule the follow-up appointment.   Remote monitoring is used to monitor your Pacemaker ofrICD from home. This monitoring reduces the number of office visits required to check your device to one time per year. It allows [place] to keep an eye on the functioning of your device to ensure it is working properly. You are scheduled for a device check from home on 07/19/15. You may send your transmission at any time that day. If you have a wireless device, the transmission will be sent automatically. After your physician reviews your transmission, you will receive a postcard with your next transmission date.     Any Other Special Instructions Will Be Listed Below (If Applicable).

## 2015-04-20 ENCOUNTER — Encounter: Payer: Self-pay | Admitting: Gynecology

## 2015-04-20 ENCOUNTER — Encounter: Payer: Medicare Other | Admitting: Gynecology

## 2015-04-28 ENCOUNTER — Ambulatory Visit (INDEPENDENT_AMBULATORY_CARE_PROVIDER_SITE_OTHER): Payer: Medicare Other | Admitting: Pharmacist Clinician (PhC)/ Clinical Pharmacy Specialist

## 2015-04-28 DIAGNOSIS — I4891 Unspecified atrial fibrillation: Secondary | ICD-10-CM

## 2015-04-28 DIAGNOSIS — I639 Cerebral infarction, unspecified: Secondary | ICD-10-CM

## 2015-04-28 DIAGNOSIS — Z7901 Long term (current) use of anticoagulants: Secondary | ICD-10-CM | POA: Diagnosis not present

## 2015-04-28 LAB — POCT INR: INR: 1.7

## 2015-04-29 ENCOUNTER — Ambulatory Visit: Payer: Medicare Other | Admitting: Pharmacist Clinician (PhC)/ Clinical Pharmacy Specialist

## 2015-05-02 ENCOUNTER — Other Ambulatory Visit: Payer: Self-pay | Admitting: Cardiovascular Disease

## 2015-05-04 NOTE — Telephone Encounter (Signed)
Rx(s) sent to pharmacy electronically.  

## 2015-05-12 ENCOUNTER — Other Ambulatory Visit (HOSPITAL_COMMUNITY)
Admission: RE | Admit: 2015-05-12 | Discharge: 2015-05-12 | Disposition: A | Discharge status: Home / Self Care | Payer: Medicare Other | Source: Ambulatory Visit | Attending: Gynecology | Admitting: Gynecology

## 2015-05-12 ENCOUNTER — Encounter: Payer: Self-pay | Admitting: Gynecology

## 2015-05-12 ENCOUNTER — Ambulatory Visit (INDEPENDENT_AMBULATORY_CARE_PROVIDER_SITE_OTHER): Payer: Medicare Other | Admitting: Gynecology

## 2015-05-12 VITALS — BP 128/80 | Ht 63.0 in | Wt 177.0 lb

## 2015-05-12 DIAGNOSIS — N879 Dysplasia of cervix uteri, unspecified: Secondary | ICD-10-CM

## 2015-05-12 DIAGNOSIS — Z01419 Encounter for gynecological examination (general) (routine) without abnormal findings: Secondary | ICD-10-CM

## 2015-05-12 DIAGNOSIS — D251 Intramural leiomyoma of uterus: Secondary | ICD-10-CM | POA: Diagnosis not present

## 2015-05-12 DIAGNOSIS — Z853 Personal history of malignant neoplasm of breast: Secondary | ICD-10-CM | POA: Diagnosis not present

## 2015-05-12 NOTE — Progress Notes (Signed)
[place] 1951-10-08 [id number]   History:    64 y.o.  for annual gyn exam with no complaints today.Patient with past history of breast cancer (bilateral mastectomy and reconstruction/tram/chemotherapy and bone marrow transplant) who had positive BRCA one and subsequently had bilateral salpingo-oophorectomy.  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 Subsequent Pap smears have been normal  The patient has been followed for small fibroids. Patient states that her shingles and Tdap vaccines are up to date. She also reports a normal colonoscopy 6 years ago.  Patient has 3 sisters of which one died of breast cancer and was never tested for the BRCA1 or BRCA2 gene mutation. The other 2 sisters were tested and were negative.  Patient had a normal bone density study in 2014   Past medical history,surgical history, family history and social history were all reviewed and documented in the EPIC chart.  Gynecologic History No LMP recorded. Patient is postmenopausal. Contraception: post menopausal status Last Pap: 2015. Results were: normal Last mammogram: Bilateral mastectomy history. Results were: Bilateral mastectomy history  Obstetric History OB History  Gravida Para Term Preterm AB SAB TAB Ectopic Multiple Living  $Remov'3 2 2  1     2    'LpNAoy$ # Outcome Date GA Lbr Len/2nd Weight Sex Delivery Anes PTL Lv  3 AB           2 Term           1 Term                ROS: A ROS was performed and pertinent positives and negatives are included in the history.  GENERAL: No fevers or chills. HEENT: No change in vision, no earache, sore throat or sinus congestion. NECK: No pain or stiffness. CARDIOVASCULAR: No chest pain or pressure. No palpitations. PULMONARY: No shortness of breath, cough or wheeze. GASTROINTESTINAL: No abdominal pain, nausea,  vomiting or diarrhea, melena or bright red blood per rectum. GENITOURINARY: No urinary frequency, urgency, hesitancy or dysuria. MUSCULOSKELETAL: No joint or muscle pain, no back pain, no recent trauma. DERMATOLOGIC: No rash, no itching, no lesions. ENDOCRINE: No polyuria, polydipsia, no heat or cold intolerance. No recent change in weight. HEMATOLOGICAL: No anemia or easy bruising or bleeding. NEUROLOGIC: No headache, seizures, numbness, tingling or weakness. PSYCHIATRIC: No depression, no loss of interest in normal activity or change in sleep pattern.     Exam: chaperone present  BP 128/80 mmHg  Ht $R'5\' 3"'vo$  (1.6 m)  Wt 177 lb (80.287 kg)  BMI 31.36 kg/m2  Body mass index is 31.36 kg/(m^2).  General appearance : Well developed well nourished female. No acute distress HEENT: Eyes: no retinal hemorrhage or exudates,  Neck supple, trachea midline, no carotid bruits, no thyroidmegaly Lungs: Clear to auscultation, no rhonchi or wheezes, or rib retractions  Heart: Regular rate and rhythm, no murmurs or gallops Breast:Examined in sitting and supine position were symmetrical in appearance, no palpable masses or tenderness,  no skin retraction, no nipple inversion, no nipple discharge, no skin discoloration, no axillary or supraclavicular lymphadenopathy Abdomen: no palpable masses or tenderness, no rebound or guarding Extremities: no edema or skin discoloration or tenderness  Pelvic:  Bartholin, Urethra, Skene Glands: Within normal limits             Vagina: No gross lesions or discharge, atrophic changes  Cervix: No gross lesions or discharge  Uterus  axial, normal size, shape and consistency, non-tender and mobile  Adnexa  Without masses or tenderness  Anus and perineum  normal   Rectovaginal  normal sphincter tone without palpated masses or tenderness             Hemoccult PCP will provide     Assessment/Plan:  64 y.o. female for annual exam with past history of breast cancer several years  ago doing well. PCP has been doing her blood work. Her cardiologist is Dr.Croitoru who is monitoring her atrial fibrillation and hypertension. Pap smear was done today. Patient to schedule bone density study. We discussed importance of calcium vitamin D and regular exercise for osteoporosis prevention.   Terrance Mass MD, 3:37 PM 05/12/2015

## 2015-05-13 ENCOUNTER — Ambulatory Visit (INDEPENDENT_AMBULATORY_CARE_PROVIDER_SITE_OTHER): Payer: Medicare Other | Admitting: Pharmacist Clinician (PhC)/ Clinical Pharmacy Specialist

## 2015-05-13 DIAGNOSIS — I639 Cerebral infarction, unspecified: Secondary | ICD-10-CM | POA: Diagnosis not present

## 2015-05-13 DIAGNOSIS — Z7901 Long term (current) use of anticoagulants: Secondary | ICD-10-CM | POA: Diagnosis not present

## 2015-05-13 DIAGNOSIS — I4891 Unspecified atrial fibrillation: Secondary | ICD-10-CM | POA: Diagnosis not present

## 2015-05-13 LAB — POCT INR: INR: 2

## 2015-05-17 LAB — CYTOLOGY - PAP

## 2015-05-27 ENCOUNTER — Encounter: Payer: Self-pay | Admitting: Gynecology

## 2015-05-31 ENCOUNTER — Other Ambulatory Visit: Payer: Self-pay | Admitting: Cardiovascular Disease

## 2015-05-31 DIAGNOSIS — I10 Essential (primary) hypertension: Secondary | ICD-10-CM

## 2015-05-31 MED ORDER — LOSARTAN POTASSIUM 100 MG PO TABS: 50.0000 mg | ORAL_TABLET | Freq: Two times a day (BID) | ORAL | Status: DC

## 2015-06-03 ENCOUNTER — Ambulatory Visit (INDEPENDENT_AMBULATORY_CARE_PROVIDER_SITE_OTHER): Payer: Medicare Other | Admitting: Pharmacist Clinician (PhC)/ Clinical Pharmacy Specialist

## 2015-06-03 DIAGNOSIS — I639 Cerebral infarction, unspecified: Secondary | ICD-10-CM | POA: Diagnosis not present

## 2015-06-03 DIAGNOSIS — I4891 Unspecified atrial fibrillation: Secondary | ICD-10-CM | POA: Diagnosis not present

## 2015-06-03 DIAGNOSIS — Z7901 Long term (current) use of anticoagulants: Secondary | ICD-10-CM | POA: Diagnosis not present

## 2015-06-03 LAB — POCT INR: INR: 2.1

## 2015-06-10 ENCOUNTER — Other Ambulatory Visit: Payer: Self-pay | Admitting: Cardiovascular Disease

## 2015-06-25 ENCOUNTER — Other Ambulatory Visit: Payer: Self-pay | Admitting: Cardiovascular Disease

## 2015-06-28 ENCOUNTER — Telehealth: Payer: Self-pay | Admitting: *Deleted

## 2015-06-28 NOTE — Telephone Encounter (Signed)
Received referral from [facility].  Called pt and confirmed 07/01/15 appt w/ her.  Unable to mail packet - placed a note for the intake form to be given to pt at time of check in.  All records are in [facility].

## 2015-06-30 ENCOUNTER — Other Ambulatory Visit: Payer: Self-pay | Admitting: *Deleted

## 2015-06-30 DIAGNOSIS — C801 Malignant (primary) neoplasm, unspecified: Secondary | ICD-10-CM

## 2015-07-01 ENCOUNTER — Other Ambulatory Visit (HOSPITAL_BASED_OUTPATIENT_CLINIC_OR_DEPARTMENT_OTHER): Payer: Medicare Other

## 2015-07-01 ENCOUNTER — Ambulatory Visit (HOSPITAL_BASED_OUTPATIENT_CLINIC_OR_DEPARTMENT_OTHER): Payer: Medicare Other | Admitting: Oncology

## 2015-07-01 VITALS — BP 130/66 | HR 86 | Temp 98.2°F | Resp 18 | Ht 63.0 in | Wt 177.0 lb

## 2015-07-01 DIAGNOSIS — C801 Malignant (primary) neoplasm, unspecified: Secondary | ICD-10-CM

## 2015-07-01 DIAGNOSIS — C50911 Malignant neoplasm of unspecified site of right female breast: Secondary | ICD-10-CM | POA: Insufficient documentation

## 2015-07-01 DIAGNOSIS — Z853 Personal history of malignant neoplasm of breast: Secondary | ICD-10-CM

## 2015-07-01 DIAGNOSIS — C50912 Malignant neoplasm of unspecified site of left female breast: Secondary | ICD-10-CM

## 2015-07-01 DIAGNOSIS — I639 Cerebral infarction, unspecified: Secondary | ICD-10-CM

## 2015-07-01 DIAGNOSIS — I5022 Chronic systolic (congestive) heart failure: Secondary | ICD-10-CM

## 2015-07-01 DIAGNOSIS — Z1501 Genetic susceptibility to malignant neoplasm of breast: Principal | ICD-10-CM

## 2015-07-01 LAB — COMPREHENSIVE METABOLIC PANEL (CC13)
ALBUMIN: 4.1 g/dL (ref 3.5–5.0)
ALK PHOS: 85 U/L (ref 40–150)
ALT: 13 U/L (ref 0–55)
AST: 21 U/L (ref 5–34)
Anion Gap: 9 mEq/L (ref 3–11)
BILIRUBIN TOTAL: 0.56 mg/dL (ref 0.20–1.20)
BUN: 15.9 mg/dL (ref 7.0–26.0)
CALCIUM: 9.8 mg/dL (ref 8.4–10.4)
CO2: 27 mEq/L (ref 22–29)
Chloride: 104 mEq/L (ref 98–109)
Creatinine: 0.9 mg/dL (ref 0.6–1.1)
EGFR: 84 mL/min/{1.73_m2} — AB (ref >90)
GLUCOSE: 103 mg/dL (ref 70–140)
POTASSIUM: 4.1 meq/L (ref 3.5–5.1)
SODIUM: 139 meq/L (ref 136–145)
TOTAL PROTEIN: 7.8 g/dL (ref 6.4–8.3)

## 2015-07-01 LAB — CBC WITH DIFFERENTIAL/PLATELET
BASO%: 0.3 % (ref 0.0–2.0)
BASOS ABS: 0 10*3/uL (ref 0.0–0.1)
EOS ABS: 0.2 10*3/uL (ref 0.0–0.5)
EOS%: 2.4 % (ref 0.0–7.0)
HEMATOCRIT: 37.5 % (ref 34.8–46.6)
HEMOGLOBIN: 12.8 g/dL (ref 11.6–15.9)
LYMPH#: 2.4 10*3/uL (ref 0.9–3.3)
LYMPH%: 37.3 % (ref 14.0–49.7)
MCH: 26.4 pg (ref 25.1–34.0)
MCHC: 34.1 g/dL (ref 31.5–36.0)
MCV: 77.5 fL — AB (ref 79.5–101.0)
MONO#: 0.6 10*3/uL (ref 0.1–0.9)
MONO%: 10 % (ref 0.0–14.0)
NEUT%: 50 % (ref 38.4–76.8)
NEUTROS ABS: 3.2 10*3/uL (ref 1.5–6.5)
Platelets: 233 10*3/uL (ref 145–400)
RBC: 4.84 10*6/uL (ref 3.70–5.45)
RDW: 15 % — AB (ref 11.2–14.5)
WBC: 6.3 10*3/uL (ref 3.9–10.3)

## 2015-07-01 NOTE — Progress Notes (Signed)
[place]  [phone] [phone]     ID: FAYOLA MECKES DOB: [birth year]  MR#: [id number]  XIP#:[id number]  Patient Care Team: Maurice Small, MD as PCP - General (Family Medicine) Chauncey Cruel, MD as Consulting Physician (Oncology) Sanda Klein, MD as Consulting Physician (Cardiology) Terrance Mass, MD as Consulting Physician (Gynecology) PCP: Jonathon Bellows, MD SU:  OTHER MD:  CHIEF COMPLAINT:  BRCA positive  Breast cancer  CURRENT TREATMENT:  observation   BREAST CANCER HISTORY:  Ariane is referred back by her primary care physician, Dr. Justin Mend, because of a rise in her breast cancer tumor marker. To recap : Mckynleigh is status post bilateral breast cancers, not synchronous, and bilateral mastectomies with reconstruction. She had stage IV disease , and was treated aggressively with high-dose chemotherapy and I'll ago his stem cell transplant support at [facility] in 1998. She has remained disease-free since that time , but did have a significant cardiomyopathy, which now appears to have largely resolved. She also had a right body stroke , with minimal residuals.   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 One of her 3 sisters did die from breast cancer at an early age and one of the patient's 2 daughters has been found to carry the mutation.  Her breast cancer history is given in more detail below  INTERVAL HISTORY:  Naeemah was evaluated in the breast clinic 07/01/2015  REVIEW OF SYSTEMS:  She is doing "terrific", does water aerobics and line dancing on a daily basis. She credits her cardiac recovery at least partly to her being so persistent with her exercise program. She denies unusual headaches, visual changes, nausea, vomiting, dizziness, or gait imbalance. There has not been any cough, phlegm production, pleurisy, or shortness of breath. There's been no  change in bowel or bladder habits. When she is really pushing on the exercises she notices that her right leg and right hand are little bit weaker than the left. She still has a slight speech difficulty, which is almost not noticeable, dating back to her stroke. Otherwise there are no residuals that she is aware of. She continues on chronic anticoagulation, with no bleeding complications. A detailed review of systems today was otherwise noncontributory  PAST MEDICAL HISTORY: Past Medical History  Diagnosis Date  . Atrial fibrillation   . CHF (congestive heart failure)   . HTN (hypertension)   . Stroke   . CIN I (cervical intraepithelial neoplasia I)   . Cancer     Breast cancer-BRACA I gene  . Fibroid   . 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 Biventricular ICD (implantable cardioverter-defibrillator) in place     St.Jude    PAST SURGICAL HISTORY: Past Surgical History  Procedure Laterality Date  . Biv icd genertaor change out  12/20/2011    [employer].Jude  . Mastectomy    . Transthoracic echocardiogram  08/14/06  . Gynecologic cryosurgery    . Colposcopy    . Tubal ligation    . Breast surgery      Bilateral mastctomy and reconstruction TRAM  . Cervical cone biopsy    . Cardiac defibrillator placement    . Oophorectomy      BSO  . US echocardiography  06/25/2012    borderline concentric LVH,LV systolic fx mildly reduced,impaired LV relaxation  . Biv icd genertaor change  out N/A 12/20/2011    Procedure: BIV ICD Boyd;  Surgeon: Evans Lance, MD;  Location[facility] C[facility] LAB;  Service: Cardiovascular;  Laterality: N/A;    FAMILY HISTORY Family History  Problem Relation Age of Onset  . Coronary artery disease Other   . Breast cancer Sister     Age 26  . Hypertension Sister   . Hypertension Brother   . Heart disease Maternal Grandfather    the patient's father died in his late 32s from cirrhosis of the liver. The patient's mother died  at the age of 77 secondary to a blood clot. Andreanna had 3 brothers, 3 sisters. One sister died from breast cancer. But Anitta does not recall the exact age of diagnosis. One paternal aunt died with breast and ovarian cancer. The patient is known to be BRCA positive as is one of her 2 daughters  GYNECOLOGIC HISTORY:  No LMP recorded. Patient is postmenopausal.  menarche age 64, first live birth age 64 as she is [place] P2. She had a total abdominal hysterectomy with bilateral salpingo-oophorectomy  SOCIAL HISTORY:   she is divorced and lives alone, with no pets. Her daughter [name] and younger daughter [name] live in [place], where they worked as R in his. The patient has 3 grandchildren.    ADVANCED DIRECTIVES:  In place; her daughter [name] is her healthcare power of attorney   HEALTH MAINTENANCE: Social History  Substance Use Topics  . Smoking status: Never Smoker   . Smokeless tobacco: Never Used  . Alcohol Use: No     Colonoscopy: 2010/[facility]  PAP: July 2016  Bone density: 05/27/2015/normal  Lipid panel:  Allergies  Allergen Reactions  . Codeine Nausea And Vomiting  . Lisinopril Cough  . Oxycodone Nausea And Vomiting  . Penicillins Nausea And Vomiting  . Sulfa Antibiotics Nausea And Vomiting    Current Outpatient Prescriptions  Medication Sig Dispense Refill  . carvedilol (COREG) 6.25 MG tablet take 1 and 1/2 tablets by mouth twice a day with meals 90 tablet 0  . Cholecalciferol (D3-1000) 1000 UNITS tablet Take 1,000 Units by mouth daily.    . fish oil-omega-3 fatty acids 1000 MG capsule Take 1,000 mg by mouth daily.     . fluticasone (FLONASE) 50 MCG/ACT nasal spray Place 2 sprays into the nose daily as needed for allergies or rhinitis.     . furosemide (LASIX) 40 MG tablet Take 1 tablet (40 mg total) by mouth daily as needed. For swelling 90 tablet 3  . losartan (COZAAR) 100 MG tablet Take 0.5 tablets (50 mg total) by mouth 2 (two) times daily. 60 tablet 3  . pantoprazole  (PROTONIX) 40 MG tablet Take 40 mg by mouth daily as needed (heartburn or acid reflux).     . potassium chloride SA (K-DUR,KLOR-CON) 20 MEQ tablet Take 1 tablet (20 mEq total) by mouth daily as needed. Takes along with Furosemide when needed. 30 tablet 10  . spironolactone (ALDACTONE) 25 MG tablet take 1 tablet by mouth once daily 30 tablet 6  . temazepam (RESTORIL) 15 MG capsule Take 15 mg by mouth at bedtime as needed. For sleep    . warfarin (COUMADIN) 4 MG tablet take 1 and 1/2 tablets by mouth once daily as directed BY COUMADIN CLINIC 45 tablet 5   No current facility-administered medications for this visit.    OBJECTIVE:  Middle-aged African-American woman in no acute distress Filed Vitals:   07/01/15 1605  BP: 130/66  Pulse: 86  Temp: 98.2 F (36.8 C)  Resp: 18     Body mass index is 31.36 kg/(m^2).    ECOG FS:0 - Asymptomatic  Ocular: Sclerae unicteric, pupils equal, round and reactive to light Ear-nose-throat: Oropharynx clear and moist Lymphatic: No cervical or supraclavicular adenopathy Lungs no rales or rhonchi, good excursion bilaterally Heart regular rate and rhythm, no murmur appreciated; pacer palpable left superior anterior chest wall Abd soft, nontender, positive bowel sounds MSK no focal spinal tenderness, no joint edema Neuro: non-focal, well-oriented, appropriate affect Breasts:  Status post bilateral mastectomies with bilateral reconstruction. She also received radiation bilaterally, much more extensively to the left side. Accordingly the left sided reconstruction is more uneven and there is still some hyperpigmentation. There is no evidence of disease recurrence. Both axillae are benign.   LAB RESULTS:  CMP     Component Value Date/Time   NA 139 07/01/2015 1549   NA 138 04/14/2015 1154   K 4.1 07/01/2015 1549   K 4.1 04/14/2015 1154   CL 105 04/14/2015 1154   CO2 27 07/01/2015 1549   CO2 25 04/14/2015 1154   GLUCOSE 103 07/01/2015 1549   GLUCOSE 110*  04/14/2015 1154   BUN 15.9 07/01/2015 1549   BUN 9 04/14/2015 1154   CREATININE 0.9 07/01/2015 1549   CREATININE 0.70 04/14/2015 1154   CALCIUM 9.8 07/01/2015 1549   CALCIUM 9.7 04/14/2015 1154   PROT 7.8 07/01/2015 1549   PROT 7.3 01/05/2011 1524   ALBUMIN 4.1 07/01/2015 1549   ALBUMIN 4.0 01/05/2011 1524   AST 21 07/01/2015 1549   AST 22 01/05/2011 1524   ALT 13 07/01/2015 1549   ALT 14 01/05/2011 1524   ALKPHOS 85 07/01/2015 1549   ALKPHOS 49 01/05/2011 1524   BILITOT 0.56 07/01/2015 1549   BILITOT 0.8 01/05/2011 1524   GFRNONAA >60 04/14/2015 1154   GFRAA >60 04/14/2015 1154    INo results found for: SPEP, UPEP  Lab Results  Component Value Date   WBC 6.3 07/01/2015   NEUTROABS 3.2 07/01/2015   HGB 12.8 07/01/2015   HCT 37.5 07/01/2015   MCV 77.5* 07/01/2015   PLT 233 07/01/2015      Chemistry      Component Value Date/Time   NA 139 07/01/2015 1549   NA 138 04/14/2015 1154   K 4.1 07/01/2015 1549   K 4.1 04/14/2015 1154   CL 105 04/14/2015 1154   CO2 27 07/01/2015 1549   CO2 25 04/14/2015 1154   BUN 15.9 07/01/2015 1549   BUN 9 04/14/2015 1154   CREATININE 0.9 07/01/2015 1549   CREATININE 0.70 04/14/2015 1154      Component Value Date/Time   CALCIUM 9.8 07/01/2015 1549   CALCIUM 9.7 04/14/2015 1154   ALKPHOS 85 07/01/2015 1549   ALKPHOS 49 01/05/2011 1524   AST 21 07/01/2015 1549   AST 22 01/05/2011 1524   ALT 13 07/01/2015 1549   ALT 14 01/05/2011 1524   BILITOT 0.56 07/01/2015 1549   BILITOT 0.8 01/05/2011 1524       Lab Results  Component Value Date   LABCA2 29 04/10/2012    No components found for: [id number]  No results for input(s): INR in the last 168 hours.  Urinalysis    Component Value Date/Time   COLORURINE YELLOW 08/08/2008 1859   APPEARANCEUR CLEAR 08/08/2008 1859   LABSPEC 1.012 08/08/2008 1859   PHURINE 6.0 08/08/2008 1859   GLUCOSEU NEGATIVE 08/08/2008 1859   HGBUR NEGATIVE 08/08/2008 1859   BILIRUBINUR  NEGATIVE  08/08/2008 1859   KETONESUR NEGATIVE 08/08/2008 1859   PROTEINUR NEGATIVE 08/08/2008 1859   UROBILINOGEN 0.2 08/08/2008 1859   NITRITE NEGATIVE 08/08/2008 1859   LEUKOCYTESUR  08/08/2008 1859    NEGATIVE MICROSCOPIC NOT DONE ON URINES WITH NEGATIVE PROTEIN, BLOOD, LEUKOCYTES, NITRITE, OR GLUCOSE <1000 mg/dL.    STUDIES: Transthoracic Echocardiography  Patient:  Samar, Venneman MR #:    [id number] Study Date: 04/14/2014 Gender:   F Age:    46 Height:   160 cm Weight:   78.5 kg BSA:    1.9 m^2 Pt. Status: Room:  ORDERING   Cristopher Peru, MD REFERRING  Cristopher Peru, MD SONOGRAPHER Victorio Palm, RDCS ATTENDING  Ena Dawley, M.D. PERFORMING  [facility], Outpatient  cc:  ------------------------------------------------------------------- LV EF: 60% -  65%   ASSESSMENT: 64 [place]. [place]  (1) s/p Right lumpectomy in 1990 for a Stage II breast cancer treated with doxorubicin, cyclophosphamide and 5- fluorouracil, followed by radiation.  (2) Left lumpectomy in April of 1998 for a "Stage IV" tumor (she had left supraclavicular involvement which currently would be staged as III-C),  Estrogen and progesterone receptor negative, HER2 unknown  (a) treated with doxorubicin and paclitaxel followed by high-dose chemotherapy at [facility] with stem cell rescue  in December of 1998   (b) freceived radiation to the left supraclavicular, left axillary and left chest wall areas, completed March of 1999   (3) status post TAH-BSO 2000  (4) s/p bilateral mastectomies with flap reconstruction January 2000 with no evidence of residualdisease.  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  (a)  on lifelong  anticoagulation   PLAN:  I spent approximately 50 minutes today with Tristine I reviewing her situation and going over her prior history. It is very favorable that she is now more than 16 years out from her definitive therapy for breast cancer, with no evidence of disease recurrence. I am also delighted to see how much better her heart is doing. Finally it is gratifying  To see that she continues to be very active and despite having had a right body stroke, with only minimal residuals.  She is concerned because her CA 729 "bumped up" after being stable for a long time. We actually do not know the function of this CA-27-29 molecule in normal physiology, and it will go up and down  With apparent random this, generally within normal parameters, but occasionally going above 40.   When following these markers what is important is the trend. A single reading is not very suggestive of disease recurrence. A steadily rising trend would be. In her case I am reassured that we had a CT of the abdomen and pelvis and chest x-rays earlier this year none of which show evidence of disease recurrence.   Accordingly after much discussion what we agree to do is to check her CA 2729 monthly for 3 months, then if there is no trend go to every 3 months for 1 year, and then she will see me. If at that point everything appears stable I will start seeing her on a once a year basis and will only check lab work before that visit.  We also discussed her daughter [name], who is BRCA positive. She just got married, and  has not yet had any children. Peggye believes she is being followed with yearly breast MRIs and that certainly would be appropriate. [name] can consider undergoing bilateral salpingo-oophorectomy between 48 and 42 assuming she has completed her family by then   The patient has a good understanding of the overall plan. She agrees with it. She knows the goal of treatment in her case is cure. She will call with any problems  that may develop before her next visit here.  Chauncey Cruel, MD   07/01/2015 5:09 PM Medical Oncology and Hematology [facility] [address] [place], [place] [address] Tel. [phone]    Fax. [phone]

## 2015-07-02 ENCOUNTER — Telehealth: Payer: Self-pay | Admitting: Oncology

## 2015-07-02 ENCOUNTER — Encounter: Payer: Self-pay | Admitting: Oncology

## 2015-07-02 NOTE — Telephone Encounter (Signed)
Patient confirmed appointments. Will pick schedule up at next appointment.

## 2015-07-02 NOTE — Addendum Note (Signed)
Addended by: Chauncey Cruel on: 07/02/2015 12:50 PM   Modules accepted: Orders, SmartSet

## 2015-07-02 NOTE — Progress Notes (Signed)
New patient intake form received and updated.  Reviewed by Dr. Jana Hakim.  Sent to scan.

## 2015-07-02 NOTE — Addendum Note (Signed)
Addended by: Lujean Amel on: 07/02/2015 10:53 AM   Modules accepted: Medications

## 2015-07-06 ENCOUNTER — Encounter: Payer: Self-pay | Admitting: Genetic Counselor

## 2015-07-06 DIAGNOSIS — Z1379 Encounter for other screening for genetic and chromosomal anomalies: Secondary | ICD-10-CM

## 2015-07-06 HISTORY — DX: Encounter for other screening for genetic and chromosomal anomalies: Z13.79

## 2015-07-08 ENCOUNTER — Other Ambulatory Visit: Payer: Self-pay | Admitting: Oncology

## 2015-07-13 ENCOUNTER — Ambulatory Visit (INDEPENDENT_AMBULATORY_CARE_PROVIDER_SITE_OTHER): Payer: Medicare Other | Admitting: *Deleted

## 2015-07-13 ENCOUNTER — Ambulatory Visit (INDEPENDENT_AMBULATORY_CARE_PROVIDER_SITE_OTHER): Payer: Medicare Other | Admitting: Cardiovascular Disease

## 2015-07-13 ENCOUNTER — Encounter: Payer: Self-pay | Admitting: Cardiovascular Disease

## 2015-07-13 VITALS — BP 128/72 | HR 80 | Ht 62.0 in | Wt 175.0 lb

## 2015-07-13 DIAGNOSIS — Z95810 Presence of automatic (implantable) cardiac defibrillator: Secondary | ICD-10-CM

## 2015-07-13 DIAGNOSIS — Z7901 Long term (current) use of anticoagulants: Secondary | ICD-10-CM | POA: Diagnosis not present

## 2015-07-13 DIAGNOSIS — I639 Cerebral infarction, unspecified: Secondary | ICD-10-CM | POA: Diagnosis not present

## 2015-07-13 DIAGNOSIS — I10 Essential (primary) hypertension: Secondary | ICD-10-CM

## 2015-07-13 DIAGNOSIS — I480 Paroxysmal atrial fibrillation: Secondary | ICD-10-CM

## 2015-07-13 DIAGNOSIS — I5022 Chronic systolic (congestive) heart failure: Secondary | ICD-10-CM

## 2015-07-13 DIAGNOSIS — I4891 Unspecified atrial fibrillation: Secondary | ICD-10-CM | POA: Diagnosis not present

## 2015-07-13 DIAGNOSIS — I429 Cardiomyopathy, unspecified: Secondary | ICD-10-CM

## 2015-07-13 DIAGNOSIS — I428 Other cardiomyopathies: Secondary | ICD-10-CM

## 2015-07-13 LAB — POCT INR: INR: 2.3

## 2015-07-13 NOTE — Progress Notes (Signed)
Patient ID: Danielle Mckinney, female   DOB: [birth year], 64 y.o.   MRN: [id number]     Cardiology Office Note   Date:  07/13/2015   ID:  KAZUKO CLEMENCE, DOB [birth year], MRN [id number]  PCP:  Jonathon Bellows, MD  Cardiologist:  Cristopher Peru, MD;  Sanda Klein, MD   Chief Complaint  Patient presents with  . Follow-up    pt been having heart burn      History of Present Illness: Danielle Mckinney is a 64 y.o. female who presents for follow-up for nonischemic cardiomyopathy (probably anthracycline chemotherapy related)and I ventricular pacemaker defibrillator check. She is a CRT responder has had remarkable improvement in left ventricular systolic function (63-01% June 2015 echo) and ICD defibrillator tachycardia detection and therapies were turned off after she developed an insulation breach on her defibrillator lead with "noise". Has had no problem with pacing therapy. He has a history of atrial fibrillation and an embolic stroke and continues to take warfarin, without bleeding problems. She does not appear to have any residual neurological deficits. She continues to participate in regular physical exercise most days of the week (water aerobics, line dancing, Silver Sneakers), but continues to complain of shortness of breath when she has to climb stairs.  Device interrogation today shows normal function. Her [employer] Jude unify assura CRTD device was implanted in 2013 and has an estimated longevity of just under 4 more years. Pacing thresholds, sensing and lead impedances are normal on all 3 leads. There is 95% biventricular pacing with most of the sensed beats representing PVCs (500,000 in the last 3 months). There has been no atrial fibrillation since her last device check and no other episodes of mode switch. Her thoracic impedance suggest a brief period of fluid overload in early September, spontaneously resolved. There were no associated symptoms or edema around that time.  She had a scare with an elevated  level of one of her breast cancer biomarkers, but this is under observation only, not requiring treatment.    Past Medical History  Diagnosis Date  . Atrial fibrillation   . CHF (congestive heart failure)   . HTN (hypertension)   . Stroke   . CIN I (cervical intraepithelial neoplasia I)   . Cancer     Breast cancer-BRACA I gene  . Fibroid   . 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 Biventricular ICD (implantable cardioverter-defibrillator) in place     St.Jude  . Breast cancer     Past Surgical History  Procedure Laterality Date  . Biv icd genertaor change out  12/20/2011    [facility]  . Mastectomy    . Transthoracic echocardiogram  08/14/06  . Gynecologic cryosurgery    . Colposcopy    . Tubal ligation    . Breast surgery      Bilateral mastctomy and reconstruction TRAM  . Cervical cone biopsy    . Cardiac defibrillator placement    . Oophorectomy      BSO  . US echocardiography  06/25/2012    borderline concentric LVH,LV systolic fx mildly reduced,impaired LV relaxation  . Biv icd genertaor change out N/A 12/20/2011    Procedure: BIV ICD GENERTAOR CHANGE OUT;  Surgeon: Evans Lance, MD;  Location[facility] CATH LAB;  Service: Cardiovascular;  Laterality: N/A;     Current Outpatient Prescriptions  Medication Sig Dispense Refill  . carvedilol (COREG) 6.25 MG tablet take 1 and 1/2 tablets by mouth twice a day with  meals 90 tablet 0  . Cholecalciferol (D3-1000) 1000 UNITS tablet Take 1,000 Units by mouth daily.    . fish oil-omega-3 fatty acids 1000 MG capsule Take 1,000 mg by mouth daily.     . fluticasone (FLONASE) 50 MCG/ACT nasal spray Place 2 sprays into the nose daily as needed for allergies or rhinitis.     . furosemide (LASIX) 40 MG tablet Take 1 tablet (40 mg total) by mouth daily as needed. For swelling 90 tablet 3  . losartan (COZAAR) 100 MG tablet Take 0.5 tablets (50 mg total) by mouth 2 (two) times daily. 60 tablet 3  . meclizine  (ANTIVERT) 25 MG tablet take 1/2-1 tablet by mouth twice a day if needed  0  . pantoprazole (PROTONIX) 40 MG tablet Take 40 mg by mouth daily as needed (heartburn or acid reflux).     . potassium chloride SA (K-DUR,KLOR-CON) 20 MEQ tablet Take 1 tablet (20 mEq total) by mouth daily as needed. Takes along with Furosemide when needed. 30 tablet 10  . spironolactone (ALDACTONE) 25 MG tablet take 1 tablet by mouth once daily 30 tablet 6  . temazepam (RESTORIL) 15 MG capsule Take 15 mg by mouth at bedtime as needed. For sleep    . warfarin (COUMADIN) 4 MG tablet take 1 and 1/2 tablets by mouth once daily as directed BY COUMADIN CLINIC 45 tablet 5   No current facility-administered medications for this visit.    Allergies:   Codeine; Lisinopril; Oxycodone; Penicillins; and Sulfa antibiotics    Social History:  The patient  reports that she has never smoked. She has never used smokeless tobacco. She reports that she does not drink alcohol or use illicit drugs.   Family History:  The patient's family history includes Breast cancer in her sister; Cirrhosis in her father; Clotting disorder in her mother; Coronary artery disease in her other; Heart disease in her maternal grandfather; Hypertension in her brother and sister.    ROS:  Please see the history of present illness.    Otherwise, review of systems positive for none.   All other systems are reviewed and negative.    PHYSICAL EXAM: VS:  BP 128/72 mmHg  Pulse 80  Ht 5\' 2"  (1.575 m)  Wt 175 lb (79.379 kg)  BMI 32.00 kg/m2 , BMI Body mass index is 32 kg/(m^2).  General: Alert, oriented x3, no distress Head: no evidence of trauma, PERRL, EOMI, no exophtalmos or lid lag, no myxedema, no xanthelasma; normal ears, nose and oropharynx Neck: normal jugular venous pulsations and no hepatojugular reflux; brisk carotid pulses without delay and no carotid bruits Chest: clear to auscultation, no signs of consolidation by percussion or palpation,  normal fremitus, symmetrical and full respiratory excursions, healthy subclavian ICD site Cardiovascular: normal position and quality of the apical impulse, regular rhythm, normal first and second heart sounds, no murmurs, rubs or gallops Abdomen: no tenderness or distention, no masses by palpation, no abnormal pulsatility or arterial bruits, normal bowel sounds, no hepatosplenomegaly Extremities: no clubbing, cyanosis or edema; 2+ radial, ulnar and brachial pulses bilaterally; 2+ right femoral, posterior tibial and dorsalis pedis pulses; 2+ left femoral, posterior tibial and dorsalis pedis pulses; no subclavian or femoral bruits Neurological: grossly nonfocal Psych: euthymic mood, full affect   EKG:  EKG is not ordered today.   Recent Labs: 07/01/2015: ALT 13; BUN 15.9; Creatinine 0.9; HGB 12.8; Platelets 233; Potassium 4.1; Sodium 139    Lipid Panel No results found for: CHOL, TRIG, HDL, CHOLHDL,  VLDL, LDLCALC, LDLDIRECT    Wt Readings from Last 3 Encounters:  07/13/15 175 lb (79.379 kg)  07/01/15 177 lb (80.287 kg)  05/12/15 177 lb (80.287 kg)    .   ASSESSMENT AND PLAN:  Nonischemic cardiomyopathy Currently NYHA functional class 1-2, clinically appears to be euvolemic despite using only intermittent loop diuretics. She takes an angiotensin receptor blocker and Aldactone. Previous attempts to increase the doses of beta blockers have not been well tolerated. She appears to be a definite responder to cardiac resynchronization therapy, now with normal left ventricular systolic function. Discussed daily weight monitoring, sodium restriction.  ICD-St.Jude CRT-D (programmed CRT-P)  Followed by Dr. Lovena Le, last interrogated May 2015. No signs of heart failure exacerbation, other than brief transient drop in thoracic impedance measurements. 95 % biventricular pacing efficiency. No episodes of ventricular arrhythmia or atrial fibrillation recorded. Defibrillator malfunction (RV lead  externalized conductor). Tachycardia therapies turned off. There does not appear to be any problem with the basic sensing and pacing on the right ventricular lead.  Atrial fibrillation  No symptomatic events and no recent episodes on defibrillator interrogation. On appropriate warfarin anticoagulation. No bleeding complications. Note history of stroke.   HYPERTENSION  Excellent control. Sodium restriction is reinforced.  Current medicines are reviewed at length with the patient today.  The patient does not have concerns regarding medicines.  The following changes have been made:  no change  Labs/ tests ordered today include:  No orders of the defined types were placed in this encounter.    Patient Instructions  Remote monitoring is used to monitor your ICD from home. This monitoring reduces the number of office visits required to check your device to one time per year. It allows [place] to monitor the functioning of your device to ensure it is working properly. You are scheduled for a device check from home on October 13, 2015. You may send your transmission at any time that day. If you have a wireless device, the transmission will be sent automatically. After your physician reviews your transmission, you will receive a postcard with your next transmission date.  Dr. Sallyanne Kuster recommends that you schedule a follow-up appointment in: [place] ([place]).        Mikael Spray, MD  07/13/2015 1:09 PM    Sanda Klein, MD, [facility] [phone] office [phone] pager

## 2015-07-13 NOTE — Patient Instructions (Signed)
Remote monitoring is used to monitor your ICD from home. This monitoring reduces the number of office visits required to check your device to one time per year. It allows [place] to monitor the functioning of your device to ensure it is working properly. You are scheduled for a device check from home on October 13, 2015. You may send your transmission at any time that day. If you have a wireless device, the transmission will be sent automatically. After your physician reviews your transmission, you will receive a postcard with your next transmission date.  Dr. Sallyanne Kuster recommends that you schedule a follow-up appointment in: [place] ([place]).

## 2015-07-14 ENCOUNTER — Telehealth: Payer: Self-pay | Admitting: Pharmacist Clinician (PhC)/ Clinical Pharmacy Specialist

## 2015-07-15 NOTE — Telephone Encounter (Signed)
Close encounter 

## 2015-07-19 ENCOUNTER — Encounter: Payer: Self-pay | Admitting: Internal Medicine

## 2015-07-19 ENCOUNTER — Ambulatory Visit (INDEPENDENT_AMBULATORY_CARE_PROVIDER_SITE_OTHER): Payer: Medicare Other | Admitting: *Deleted

## 2015-07-19 DIAGNOSIS — I5022 Chronic systolic (congestive) heart failure: Secondary | ICD-10-CM

## 2015-07-19 DIAGNOSIS — I428 Other cardiomyopathies: Secondary | ICD-10-CM

## 2015-07-19 DIAGNOSIS — I429 Cardiomyopathy, unspecified: Secondary | ICD-10-CM

## 2015-07-19 NOTE — Progress Notes (Signed)
Remote ICD transmission.   

## 2015-07-23 ENCOUNTER — Telehealth: Payer: Self-pay | Admitting: Internal Medicine

## 2015-07-23 NOTE — Telephone Encounter (Signed)
LM informing patient that remote was received. Encouraged patient to call with questions.

## 2015-07-23 NOTE — Telephone Encounter (Signed)
New Prob ° ° ° °Pt is calling to verify her transmission was received. Please call. °

## 2015-07-26 LAB — CUP PACEART REMOTE DEVICE CHECK
Battery Remaining Longevity: 46 mo
Brady Statistic AP VP Percent: 6.9 %
Brady Statistic AP VS Percent: 1 %
Brady Statistic RA Percent Paced: 6.8 %
HIGH POWER IMPEDANCE MEASURED VALUE: 44 Ohm
Lead Channel Impedance Value: 400 Ohm
Lead Channel Impedance Value: 630 Ohm
Lead Channel Pacing Threshold Amplitude: 0.75 V
Lead Channel Pacing Threshold Amplitude: 0.75 V
Lead Channel Pacing Threshold Pulse Width: 0.5 ms
Lead Channel Pacing Threshold Pulse Width: 0.8 ms
Lead Channel Sensing Intrinsic Amplitude: 11.3 mV
Lead Channel Setting Pacing Amplitude: 1.5 V
Lead Channel Setting Pacing Amplitude: 1.75 V
Lead Channel Setting Pacing Pulse Width: 0.8 ms
MDC IDC MSMT BATTERY REMAINING PERCENTAGE: 55 %
MDC IDC MSMT BATTERY VOLTAGE: 2.92 V
MDC IDC MSMT LEADCHNL RA IMPEDANCE VALUE: 330 Ohm
MDC IDC MSMT LEADCHNL RA PACING THRESHOLD AMPLITUDE: 0.5 V
MDC IDC MSMT LEADCHNL RA PACING THRESHOLD PULSEWIDTH: 0.5 ms
MDC IDC MSMT LEADCHNL RA SENSING INTR AMPL: 5 mV
MDC IDC SESS DTM: 20160919072104
MDC IDC SET LEADCHNL RV PACING AMPLITUDE: 2 V
MDC IDC SET LEADCHNL RV PACING PULSEWIDTH: 0.5 ms
MDC IDC SET LEADCHNL RV SENSING SENSITIVITY: 0.5 mV
MDC IDC STAT BRADY AS VP PERCENT: 87 %
MDC IDC STAT BRADY AS VS PERCENT: 2.5 %
Pulse Gen Model: 3257
Pulse Gen Serial Number: 7027976

## 2015-08-01 LAB — CUP PACEART INCLINIC DEVICE CHECK
Brady Statistic RV Percent Paced: 95 %
Date Time Interrogation Session: 20161002104338
HighPow Impedance: 50 Ohm
Lead Channel Impedance Value: 750 Ohm
Lead Channel Pacing Threshold Pulse Width: 0.5 ms
Lead Channel Sensing Intrinsic Amplitude: 11.3 mV
Lead Channel Setting Pacing Pulse Width: 0.5 ms
MDC IDC MSMT BATTERY REMAINING PERCENTAGE: 55 %
MDC IDC MSMT LEADCHNL LV PACING THRESHOLD AMPLITUDE: 0.75 V
MDC IDC MSMT LEADCHNL LV PACING THRESHOLD PULSEWIDTH: 0.8 ms
MDC IDC MSMT LEADCHNL RA IMPEDANCE VALUE: 310 Ohm
MDC IDC MSMT LEADCHNL RA PACING THRESHOLD AMPLITUDE: 0.5 V
MDC IDC MSMT LEADCHNL RA PACING THRESHOLD PULSEWIDTH: 0.5 ms
MDC IDC MSMT LEADCHNL RA SENSING INTR AMPL: 5 mV
MDC IDC MSMT LEADCHNL RV IMPEDANCE VALUE: 400 Ohm
MDC IDC MSMT LEADCHNL RV PACING THRESHOLD AMPLITUDE: 0.625 V
MDC IDC SET LEADCHNL LV PACING AMPLITUDE: 1.75 V
MDC IDC SET LEADCHNL LV PACING PULSEWIDTH: 0.8 ms
MDC IDC SET LEADCHNL RA PACING AMPLITUDE: 1.5 V
MDC IDC SET LEADCHNL RV PACING AMPLITUDE: 2 V
MDC IDC SET LEADCHNL RV SENSING SENSITIVITY: 0.5 mV
MDC IDC STAT BRADY RA PERCENT PACED: 4.6 %
Pulse Gen Model: 3257
Pulse Gen Serial Number: 7027976

## 2015-08-03 ENCOUNTER — Other Ambulatory Visit (HOSPITAL_BASED_OUTPATIENT_CLINIC_OR_DEPARTMENT_OTHER): Payer: Medicare Other

## 2015-08-03 DIAGNOSIS — C50912 Malignant neoplasm of unspecified site of left female breast: Secondary | ICD-10-CM

## 2015-08-03 DIAGNOSIS — Z853 Personal history of malignant neoplasm of breast: Secondary | ICD-10-CM | POA: Diagnosis not present

## 2015-08-03 DIAGNOSIS — Z1501 Genetic susceptibility to malignant neoplasm of breast: Principal | ICD-10-CM

## 2015-08-03 LAB — CBC WITH DIFFERENTIAL/PLATELET
BASO%: 0.7 % (ref 0.0–2.0)
Basophils Absolute: 0 10*3/uL (ref 0.0–0.1)
EOS%: 2.5 % (ref 0.0–7.0)
Eosinophils Absolute: 0.1 10*3/uL (ref 0.0–0.5)
HEMATOCRIT: 36.1 % (ref 34.8–46.6)
HEMOGLOBIN: 12.3 g/dL (ref 11.6–15.9)
LYMPH#: 2.2 10*3/uL (ref 0.9–3.3)
LYMPH%: 39 % (ref 14.0–49.7)
MCH: 26.6 pg (ref 25.1–34.0)
MCHC: 34 g/dL (ref 31.5–36.0)
MCV: 78.1 fL — ABNORMAL LOW (ref 79.5–101.0)
MONO#: 0.5 10*3/uL (ref 0.1–0.9)
MONO%: 8.9 % (ref 0.0–14.0)
NEUT#: 2.8 10*3/uL (ref 1.5–6.5)
NEUT%: 48.9 % (ref 38.4–76.8)
PLATELETS: 254 10*3/uL (ref 145–400)
RBC: 4.62 10*6/uL (ref 3.70–5.45)
RDW: 16.4 % — AB (ref 11.2–14.5)
WBC: 5.8 10*3/uL (ref 3.9–10.3)

## 2015-08-03 LAB — COMPREHENSIVE METABOLIC PANEL (CC13)
ALT: 11 U/L (ref 0–55)
AST: 18 U/L (ref 5–34)
Albumin: 3.9 g/dL (ref 3.5–5.0)
Alkaline Phosphatase: 78 U/L (ref 40–150)
Anion Gap: 7 meq/L (ref 3–11)
BUN: 12.5 mg/dL (ref 7.0–26.0)
CO2: 26 meq/L (ref 22–29)
Calcium: 9.7 mg/dL (ref 8.4–10.4)
Chloride: 105 meq/L (ref 98–109)
Creatinine: 0.9 mg/dL (ref 0.6–1.1)
EGFR: 78 ml/min/1.73 m2 — ABNORMAL LOW
Glucose: 108 mg/dL (ref 70–140)
Potassium: 4 meq/L (ref 3.5–5.1)
Sodium: 139 meq/L (ref 136–145)
Total Bilirubin: 0.48 mg/dL (ref 0.20–1.20)
Total Protein: 7.4 g/dL (ref 6.4–8.3)

## 2015-08-04 ENCOUNTER — Encounter (HOSPITAL_COMMUNITY): Payer: Self-pay

## 2015-08-04 LAB — CANCER ANTIGEN 27.29: CA 27.29: 23 U/mL (ref 0–39)

## 2015-08-10 ENCOUNTER — Encounter: Payer: Self-pay | Admitting: Cardiology

## 2015-08-11 ENCOUNTER — Encounter: Payer: Self-pay | Admitting: Internal Medicine

## 2015-08-17 ENCOUNTER — Telehealth: Payer: Self-pay | Admitting: Oncology

## 2015-08-17 NOTE — Telephone Encounter (Signed)
Returned patient call re appointments and per patient she will be out of town 11/1 and needs to r/s to 11/10. Lab appointments r/s from 11/1 to 11/10 and from 11/29 to 12/8. Patient has new appointment times.

## 2015-08-18 ENCOUNTER — Other Ambulatory Visit: Payer: Self-pay | Admitting: Cardiovascular Disease

## 2015-08-23 ENCOUNTER — Ambulatory Visit (INDEPENDENT_AMBULATORY_CARE_PROVIDER_SITE_OTHER): Payer: Medicare Other | Admitting: Pharmacist Clinician (PhC)/ Clinical Pharmacy Specialist

## 2015-08-23 ENCOUNTER — Ambulatory Visit: Payer: Medicare Other | Admitting: Pharmacist Clinician (PhC)/ Clinical Pharmacy Specialist

## 2015-08-23 DIAGNOSIS — Z7901 Long term (current) use of anticoagulants: Secondary | ICD-10-CM

## 2015-08-23 DIAGNOSIS — I480 Paroxysmal atrial fibrillation: Secondary | ICD-10-CM

## 2015-08-23 DIAGNOSIS — I4891 Unspecified atrial fibrillation: Secondary | ICD-10-CM | POA: Diagnosis not present

## 2015-08-23 DIAGNOSIS — I639 Cerebral infarction, unspecified: Secondary | ICD-10-CM | POA: Diagnosis not present

## 2015-08-23 LAB — POCT INR: INR: 2.2

## 2015-08-31 ENCOUNTER — Other Ambulatory Visit: Payer: Medicare Other

## 2015-09-09 ENCOUNTER — Other Ambulatory Visit (HOSPITAL_BASED_OUTPATIENT_CLINIC_OR_DEPARTMENT_OTHER): Payer: Medicare Other

## 2015-09-09 DIAGNOSIS — C50912 Malignant neoplasm of unspecified site of left female breast: Secondary | ICD-10-CM

## 2015-09-09 DIAGNOSIS — Z15060 Genetic susceptibility to colorectal cancer: Secondary | ICD-10-CM

## 2015-09-09 DIAGNOSIS — Z1501 Genetic susceptibility to malignant neoplasm of breast: Principal | ICD-10-CM

## 2015-09-10 LAB — CANCER ANTIGEN 27.29: CA 27.29: 25 U/mL (ref 0–39)

## 2015-09-15 ENCOUNTER — Telehealth: Payer: Self-pay | Admitting: Cardiology

## 2015-09-15 NOTE — Telephone Encounter (Signed)
Patient called about [facility] Jude recall certified letter. Patient stated that she wanted to know more details about the recall. Patient currently uses her home monitor. I informed patient that we would be looking at the [facility] site weekly and if we noticed that her home monitor does not update in a weeks time we would call and ask that her send a manual remote transmission. Informed patient that I would make her MD aware of her call about the letter. Patient verbalized understanding.

## 2015-09-26 NOTE — Telephone Encounter (Signed)
Continue remote monitoring. GT 

## 2015-09-28 ENCOUNTER — Other Ambulatory Visit (HOSPITAL_BASED_OUTPATIENT_CLINIC_OR_DEPARTMENT_OTHER): Payer: Medicare Other

## 2015-09-28 DIAGNOSIS — C50912 Malignant neoplasm of unspecified site of left female breast: Secondary | ICD-10-CM | POA: Diagnosis not present

## 2015-09-28 DIAGNOSIS — Z1501 Genetic susceptibility to malignant neoplasm of breast: Principal | ICD-10-CM

## 2015-09-29 LAB — CANCER ANTIGEN 27.29: CA 27.29: 24 U/mL (ref 0–39)

## 2015-10-06 ENCOUNTER — Ambulatory Visit: Payer: Medicare Other | Admitting: Pharmacist Clinician (PhC)/ Clinical Pharmacy Specialist

## 2015-10-07 ENCOUNTER — Other Ambulatory Visit (HOSPITAL_BASED_OUTPATIENT_CLINIC_OR_DEPARTMENT_OTHER): Payer: Medicare Other

## 2015-10-07 DIAGNOSIS — Z1501 Genetic susceptibility to malignant neoplasm of breast: Principal | ICD-10-CM

## 2015-10-07 DIAGNOSIS — C50912 Malignant neoplasm of unspecified site of left female breast: Secondary | ICD-10-CM | POA: Diagnosis not present

## 2015-10-08 LAB — CANCER ANTIGEN 27.29: CA 27.29: 27 U/mL (ref 0–39)

## 2015-10-13 ENCOUNTER — Ambulatory Visit (INDEPENDENT_AMBULATORY_CARE_PROVIDER_SITE_OTHER): Payer: Medicare Other | Admitting: Pharmacist Clinician (PhC)/ Clinical Pharmacy Specialist

## 2015-10-13 DIAGNOSIS — I639 Cerebral infarction, unspecified: Secondary | ICD-10-CM | POA: Diagnosis not present

## 2015-10-13 DIAGNOSIS — I4891 Unspecified atrial fibrillation: Secondary | ICD-10-CM | POA: Diagnosis not present

## 2015-10-13 DIAGNOSIS — Z7901 Long term (current) use of anticoagulants: Secondary | ICD-10-CM

## 2015-10-13 LAB — POCT INR: INR: 2.1

## 2015-10-18 ENCOUNTER — Ambulatory Visit (INDEPENDENT_AMBULATORY_CARE_PROVIDER_SITE_OTHER): Payer: Medicare Other | Admitting: *Deleted

## 2015-10-18 DIAGNOSIS — I5022 Chronic systolic (congestive) heart failure: Secondary | ICD-10-CM

## 2015-10-18 DIAGNOSIS — I428 Other cardiomyopathies: Secondary | ICD-10-CM

## 2015-10-18 DIAGNOSIS — I429 Cardiomyopathy, unspecified: Secondary | ICD-10-CM | POA: Diagnosis not present

## 2015-10-19 NOTE — Progress Notes (Signed)
Remote ICD transmission.   

## 2015-10-21 LAB — CUP PACEART REMOTE DEVICE CHECK
Battery Voltage: 2.92 V
Brady Statistic AP VP Percent: 4.7 %
Brady Statistic AP VS Percent: 1 %
Brady Statistic AS VP Percent: 91 %
Brady Statistic RA Percent Paced: 4.6 %
Date Time Interrogation Session: 20161219075138
HIGH POWER IMPEDANCE MEASURED VALUE: 50 Ohm
Implantable Lead Implant Date: 20080307
Implantable Lead Implant Date: 20080307
Implantable Lead Location: 753858
Implantable Lead Location: 753860
Lead Channel Impedance Value: 450 Ohm
Lead Channel Impedance Value: 730 Ohm
Lead Channel Pacing Threshold Amplitude: 0.75 V
Lead Channel Pacing Threshold Pulse Width: 0.5 ms
Lead Channel Setting Pacing Amplitude: 1.375
Lead Channel Setting Pacing Amplitude: 1.75 V
Lead Channel Setting Pacing Pulse Width: 0.5 ms
Lead Channel Setting Pacing Pulse Width: 0.8 ms
MDC IDC LEAD IMPLANT DT: 20080307
MDC IDC LEAD LOCATION: 753859
MDC IDC LEAD MODEL: 7001
MDC IDC MSMT BATTERY REMAINING LONGEVITY: 44 mo
MDC IDC MSMT BATTERY REMAINING PERCENTAGE: 52 %
MDC IDC MSMT LEADCHNL RA IMPEDANCE VALUE: 350 Ohm
MDC IDC MSMT LEADCHNL RA PACING THRESHOLD AMPLITUDE: 0.375 V
MDC IDC MSMT LEADCHNL RA PACING THRESHOLD PULSEWIDTH: 0.5 ms
MDC IDC MSMT LEADCHNL RA SENSING INTR AMPL: 5 mV
MDC IDC MSMT LEADCHNL RV SENSING INTR AMPL: 11.3 mV
MDC IDC SET LEADCHNL RV PACING AMPLITUDE: 2 V
MDC IDC SET LEADCHNL RV SENSING SENSITIVITY: 0.5 mV
MDC IDC STAT BRADY AS VS PERCENT: 1.7 %
Pulse Gen Serial Number: 7027976

## 2015-10-22 ENCOUNTER — Encounter: Payer: Self-pay | Admitting: Cardiology

## 2015-11-14 ENCOUNTER — Other Ambulatory Visit: Payer: Self-pay | Admitting: Cardiovascular Disease

## 2015-11-24 ENCOUNTER — Encounter: Payer: Medicare Other | Admitting: Pharmacist Clinician (PhC)/ Clinical Pharmacy Specialist

## 2015-11-26 ENCOUNTER — Ambulatory Visit (INDEPENDENT_AMBULATORY_CARE_PROVIDER_SITE_OTHER): Payer: Medicare Other | Admitting: Pharmacist Clinician (PhC)/ Clinical Pharmacy Specialist

## 2015-11-26 DIAGNOSIS — I639 Cerebral infarction, unspecified: Secondary | ICD-10-CM

## 2015-11-26 DIAGNOSIS — Z7901 Long term (current) use of anticoagulants: Secondary | ICD-10-CM

## 2015-11-26 DIAGNOSIS — I4891 Unspecified atrial fibrillation: Secondary | ICD-10-CM | POA: Diagnosis not present

## 2015-11-26 LAB — POCT INR: INR: 3

## 2015-12-21 ENCOUNTER — Ambulatory Visit (INDEPENDENT_AMBULATORY_CARE_PROVIDER_SITE_OTHER): Payer: Medicare Other | Admitting: Pharmacist Clinician (PhC)/ Clinical Pharmacy Specialist

## 2015-12-21 DIAGNOSIS — Z7901 Long term (current) use of anticoagulants: Secondary | ICD-10-CM | POA: Diagnosis not present

## 2015-12-21 DIAGNOSIS — I639 Cerebral infarction, unspecified: Secondary | ICD-10-CM | POA: Diagnosis not present

## 2015-12-21 DIAGNOSIS — I4891 Unspecified atrial fibrillation: Secondary | ICD-10-CM

## 2015-12-21 LAB — POCT INR: INR: 2.3

## 2015-12-23 ENCOUNTER — Other Ambulatory Visit: Payer: Self-pay | Admitting: Pharmacist Clinician (PhC)/ Clinical Pharmacy Specialist

## 2015-12-23 DIAGNOSIS — I4891 Unspecified atrial fibrillation: Secondary | ICD-10-CM

## 2015-12-23 DIAGNOSIS — I639 Cerebral infarction, unspecified: Secondary | ICD-10-CM

## 2015-12-23 DIAGNOSIS — Z7901 Long term (current) use of anticoagulants: Secondary | ICD-10-CM

## 2015-12-28 ENCOUNTER — Other Ambulatory Visit: Payer: Medicare Other

## 2015-12-29 ENCOUNTER — Other Ambulatory Visit: Payer: Self-pay

## 2015-12-29 DIAGNOSIS — Z1501 Genetic susceptibility to malignant neoplasm of breast: Principal | ICD-10-CM

## 2015-12-29 DIAGNOSIS — C50912 Malignant neoplasm of unspecified site of left female breast: Secondary | ICD-10-CM

## 2015-12-30 ENCOUNTER — Other Ambulatory Visit (HOSPITAL_BASED_OUTPATIENT_CLINIC_OR_DEPARTMENT_OTHER): Payer: Medicare Other

## 2015-12-30 DIAGNOSIS — C50912 Malignant neoplasm of unspecified site of left female breast: Secondary | ICD-10-CM

## 2015-12-30 DIAGNOSIS — Z1501 Genetic susceptibility to malignant neoplasm of breast: Principal | ICD-10-CM

## 2015-12-31 LAB — CANCER ANTIGEN 27.29: CAN 27.29: 25.9 U/mL (ref 0.0–38.6)

## 2015-12-31 LAB — CANCER ANTIGEN 27-29 (PARALLEL TESTING): CA 27.29: 26 U/mL (ref <38)

## 2016-01-11 ENCOUNTER — Other Ambulatory Visit: Payer: Self-pay | Admitting: Cardiovascular Disease

## 2016-01-11 NOTE — Telephone Encounter (Signed)
REFILL 

## 2016-01-17 ENCOUNTER — Ambulatory Visit (INDEPENDENT_AMBULATORY_CARE_PROVIDER_SITE_OTHER): Payer: Medicare Other | Admitting: *Deleted

## 2016-01-17 DIAGNOSIS — I429 Cardiomyopathy, unspecified: Secondary | ICD-10-CM | POA: Diagnosis not present

## 2016-01-17 DIAGNOSIS — I5022 Chronic systolic (congestive) heart failure: Secondary | ICD-10-CM

## 2016-01-17 DIAGNOSIS — I428 Other cardiomyopathies: Secondary | ICD-10-CM

## 2016-01-18 ENCOUNTER — Other Ambulatory Visit: Payer: Self-pay | Admitting: *Deleted

## 2016-01-18 MED ORDER — POTASSIUM CHLORIDE CRYS ER 20 MEQ PO TBCR: 20.0000 meq | EXTENDED_RELEASE_TABLET | Freq: Every day | ORAL | Status: DC | PRN

## 2016-01-18 NOTE — Progress Notes (Signed)
Remote ICD transmission.   

## 2016-02-01 ENCOUNTER — Ambulatory Visit (INDEPENDENT_AMBULATORY_CARE_PROVIDER_SITE_OTHER): Payer: Medicare Other | Admitting: Pharmacist Clinician (PhC)/ Clinical Pharmacy Specialist

## 2016-02-01 DIAGNOSIS — Z7901 Long term (current) use of anticoagulants: Secondary | ICD-10-CM

## 2016-02-01 DIAGNOSIS — I480 Paroxysmal atrial fibrillation: Secondary | ICD-10-CM

## 2016-02-01 LAB — POCT INR: INR: 2.5

## 2016-02-04 ENCOUNTER — Other Ambulatory Visit: Payer: Self-pay | Admitting: Cardiovascular Disease

## 2016-02-22 LAB — CUP PACEART REMOTE DEVICE CHECK
Battery Remaining Longevity: 41 mo
Battery Remaining Percentage: 48 %
Brady Statistic AP VS Percent: 1 %
Brady Statistic AS VS Percent: 2.1 %
Date Time Interrogation Session: 20170320082237
HighPow Impedance: 46 Ohm
Implantable Lead Location: 753859
Implantable Lead Location: 753860
Lead Channel Impedance Value: 400 Ohm
Lead Channel Pacing Threshold Pulse Width: 0.5 ms
Lead Channel Sensing Intrinsic Amplitude: 11.3 mV
Lead Channel Setting Pacing Amplitude: 1.75 V
Lead Channel Setting Pacing Amplitude: 2 V
Lead Channel Setting Sensing Sensitivity: 0.5 mV
MDC IDC LEAD IMPLANT DT: 20080307
MDC IDC LEAD IMPLANT DT: 20080307
MDC IDC LEAD IMPLANT DT: 20080307
MDC IDC LEAD LOCATION: 753858
MDC IDC LEAD MODEL: 7001
MDC IDC MSMT BATTERY VOLTAGE: 2.92 V
MDC IDC MSMT LEADCHNL LV IMPEDANCE VALUE: 740 Ohm
MDC IDC MSMT LEADCHNL RA IMPEDANCE VALUE: 330 Ohm
MDC IDC MSMT LEADCHNL RA PACING THRESHOLD AMPLITUDE: 0.5 V
MDC IDC MSMT LEADCHNL RA SENSING INTR AMPL: 5 mV
MDC IDC MSMT LEADCHNL RV PACING THRESHOLD AMPLITUDE: 0.625 V
MDC IDC MSMT LEADCHNL RV PACING THRESHOLD PULSEWIDTH: 0.5 ms
MDC IDC SET LEADCHNL LV PACING PULSEWIDTH: 0.8 ms
MDC IDC SET LEADCHNL RA PACING AMPLITUDE: 1.5 V
MDC IDC SET LEADCHNL RV PACING PULSEWIDTH: 0.5 ms
MDC IDC STAT BRADY AP VP PERCENT: 3.8 %
MDC IDC STAT BRADY AS VP PERCENT: 92 %
MDC IDC STAT BRADY RA PERCENT PACED: 3.7 %
Pulse Gen Serial Number: 7027976

## 2016-02-25 ENCOUNTER — Encounter: Payer: Self-pay | Admitting: Cardiology

## 2016-03-01 ENCOUNTER — Other Ambulatory Visit: Payer: Self-pay | Admitting: Cardiovascular Disease

## 2016-03-01 NOTE — Telephone Encounter (Signed)
Rx Refill

## 2016-03-15 ENCOUNTER — Encounter: Payer: Medicare Other | Admitting: Pharmacist Clinician (PhC)/ Clinical Pharmacy Specialist

## 2016-03-21 ENCOUNTER — Ambulatory Visit (INDEPENDENT_AMBULATORY_CARE_PROVIDER_SITE_OTHER): Payer: Medicare Other | Admitting: Pharmacist

## 2016-03-21 DIAGNOSIS — I639 Cerebral infarction, unspecified: Secondary | ICD-10-CM

## 2016-03-21 DIAGNOSIS — Z7901 Long term (current) use of anticoagulants: Secondary | ICD-10-CM

## 2016-03-21 DIAGNOSIS — I4891 Unspecified atrial fibrillation: Secondary | ICD-10-CM | POA: Diagnosis not present

## 2016-03-21 LAB — POCT INR: INR: 2.7

## 2016-03-28 ENCOUNTER — Other Ambulatory Visit (HOSPITAL_BASED_OUTPATIENT_CLINIC_OR_DEPARTMENT_OTHER): Payer: Medicare Other

## 2016-03-28 DIAGNOSIS — C50912 Malignant neoplasm of unspecified site of left female breast: Secondary | ICD-10-CM | POA: Diagnosis not present

## 2016-03-28 DIAGNOSIS — Z1501 Genetic susceptibility to malignant neoplasm of breast: Principal | ICD-10-CM

## 2016-03-28 DIAGNOSIS — Z15060 Genetic susceptibility to colorectal cancer: Secondary | ICD-10-CM

## 2016-03-29 LAB — CANCER ANTIGEN 27-29 (PARALLEL TESTING): CA 27.29: 29 U/mL (ref <38)

## 2016-03-29 LAB — CANCER ANTIGEN 27.29: CA 27.29: 27.9 U/mL (ref 0.0–38.6)

## 2016-04-04 ENCOUNTER — Telehealth: Payer: Self-pay | Admitting: Cardiovascular Disease

## 2016-04-04 NOTE — Telephone Encounter (Signed)
6.5.17 Patient brought [place], Signed AUTH, and gave Check for [employer] to process.  Forms to office, received by Franky Macho. 6.6.17 Received [employer] Disability Forms, Signed AUTH and Check to process and send to [place].  Sent to [place] on 04/04/16 via [place].,

## 2016-04-11 ENCOUNTER — Telehealth: Payer: Self-pay | Admitting: Cardiovascular Disease

## 2016-04-11 NOTE — Telephone Encounter (Signed)
Received FMLA Forms back from [facility] for Dr Sallyanne Kuster to review, complete and sign.  Forms given to Dr Sallyanne Kuster to sign.

## 2016-04-17 ENCOUNTER — Ambulatory Visit: Payer: Medicare Other | Admitting: *Deleted

## 2016-04-17 ENCOUNTER — Telehealth: Payer: Self-pay | Admitting: Cardiovascular Disease

## 2016-04-17 DIAGNOSIS — I428 Other cardiomyopathies: Secondary | ICD-10-CM

## 2016-04-17 DIAGNOSIS — I5022 Chronic systolic (congestive) heart failure: Secondary | ICD-10-CM

## 2016-04-17 NOTE — Progress Notes (Signed)
Remote ICD transmission.   

## 2016-04-17 NOTE — Telephone Encounter (Signed)
Received signed [employer] Disability forms back from Dr Sallyanne Kuster.  Notified patient 04/14/15 forms ready for pick up.  Prepared for pick up at front desk check in.

## 2016-04-18 ENCOUNTER — Ambulatory Visit (INDEPENDENT_AMBULATORY_CARE_PROVIDER_SITE_OTHER): Payer: Medicare Other | Admitting: Pharmacist

## 2016-04-18 DIAGNOSIS — I4891 Unspecified atrial fibrillation: Secondary | ICD-10-CM | POA: Diagnosis not present

## 2016-04-18 DIAGNOSIS — I639 Cerebral infarction, unspecified: Secondary | ICD-10-CM | POA: Diagnosis not present

## 2016-04-18 DIAGNOSIS — Z7901 Long term (current) use of anticoagulants: Secondary | ICD-10-CM | POA: Diagnosis not present

## 2016-04-18 LAB — CUP PACEART REMOTE DEVICE CHECK
Battery Remaining Longevity: 40 mo
Battery Remaining Percentage: 46 %
Brady Statistic AP VP Percent: 4.1 %
Brady Statistic AP VS Percent: 1 %
Brady Statistic AS VS Percent: 1.6 %
HighPow Impedance: 46 Ohm
Implantable Lead Implant Date: 20080307
Implantable Lead Location: 753859
Lead Channel Impedance Value: 340 Ohm
Lead Channel Impedance Value: 780 Ohm
Lead Channel Pacing Threshold Amplitude: 0.75 V
Lead Channel Pacing Threshold Pulse Width: 0.5 ms
Lead Channel Pacing Threshold Pulse Width: 0.8 ms
Lead Channel Sensing Intrinsic Amplitude: 11.3 mV
Lead Channel Setting Pacing Amplitude: 1.5 V
Lead Channel Setting Pacing Amplitude: 1.75 V
Lead Channel Setting Pacing Amplitude: 2 V
Lead Channel Setting Pacing Pulse Width: 0.8 ms
MDC IDC LEAD IMPLANT DT: 20080307
MDC IDC LEAD IMPLANT DT: 20080307
MDC IDC LEAD LOCATION: 753858
MDC IDC LEAD LOCATION: 753860
MDC IDC LEAD MODEL: 7001
MDC IDC MSMT BATTERY VOLTAGE: 2.92 V
MDC IDC MSMT LEADCHNL RV IMPEDANCE VALUE: 410 Ohm
MDC IDC MSMT LEADCHNL RV PACING THRESHOLD AMPLITUDE: 0.75 V
MDC IDC PG SERIAL: 7027976
MDC IDC SESS DTM: 20170619060018
MDC IDC SET LEADCHNL RV PACING PULSEWIDTH: 0.5 ms
MDC IDC SET LEADCHNL RV SENSING SENSITIVITY: 0.5 mV
MDC IDC STAT BRADY AS VP PERCENT: 93 %
MDC IDC STAT BRADY RA PERCENT PACED: 4 %

## 2016-04-18 LAB — POCT INR: INR: 2.9

## 2016-04-20 ENCOUNTER — Encounter: Payer: Self-pay | Admitting: Internal Medicine

## 2016-04-20 ENCOUNTER — Ambulatory Visit (INDEPENDENT_AMBULATORY_CARE_PROVIDER_SITE_OTHER): Payer: Medicare Other | Admitting: Internal Medicine

## 2016-04-20 VITALS — BP 136/82 | HR 74 | Ht 62.5 in | Wt 185.8 lb

## 2016-04-20 DIAGNOSIS — I5022 Chronic systolic (congestive) heart failure: Secondary | ICD-10-CM | POA: Diagnosis not present

## 2016-04-20 LAB — CUP PACEART INCLINIC DEVICE CHECK
Battery Remaining Longevity: 39.6
Brady Statistic RV Percent Paced: 97 %
HighPow Impedance: 49.425
Implantable Lead Implant Date: 20080307
Implantable Lead Location: 753858
Implantable Lead Location: 753859
Implantable Lead Location: 753860
Implantable Lead Model: 7001
Lead Channel Impedance Value: 337.5 Ohm
Lead Channel Impedance Value: 462.5 Ohm
Lead Channel Pacing Threshold Amplitude: 0.75 V
Lead Channel Pacing Threshold Pulse Width: 0.5 ms
Lead Channel Pacing Threshold Pulse Width: 0.5 ms
Lead Channel Pacing Threshold Pulse Width: 0.8 ms
Lead Channel Setting Pacing Amplitude: 2 V
Lead Channel Setting Pacing Pulse Width: 0.5 ms
Lead Channel Setting Pacing Pulse Width: 0.8 ms
Lead Channel Setting Sensing Sensitivity: 0.5 mV
MDC IDC LEAD IMPLANT DT: 20080307
MDC IDC LEAD IMPLANT DT: 20080307
MDC IDC MSMT LEADCHNL LV IMPEDANCE VALUE: 787.5 Ohm
MDC IDC MSMT LEADCHNL LV PACING THRESHOLD AMPLITUDE: 0.75 V
MDC IDC MSMT LEADCHNL LV PACING THRESHOLD AMPLITUDE: 0.75 V
MDC IDC MSMT LEADCHNL LV PACING THRESHOLD PULSEWIDTH: 0.8 ms
MDC IDC MSMT LEADCHNL RA PACING THRESHOLD AMPLITUDE: 0.5 V
MDC IDC MSMT LEADCHNL RA SENSING INTR AMPL: 5 mV
MDC IDC MSMT LEADCHNL RV SENSING INTR AMPL: 11.3 mV
MDC IDC PG SERIAL: 7027976
MDC IDC SESS DTM: 20170622151918
MDC IDC SET LEADCHNL LV PACING AMPLITUDE: 1.75 V
MDC IDC SET LEADCHNL RA PACING AMPLITUDE: 1.5 V
MDC IDC STAT BRADY RA PERCENT PACED: 3.9 %

## 2016-04-20 NOTE — Patient Instructions (Signed)
Medication Instructions:  Your physician recommends that you continue on your current medications as directed. Please refer to the Current Medication list given to you today.  Labwork: None ordered  Testing/Procedures: None ordered  Follow-Up: Remote monitoring is used to monitor your Pacemaker of ICD from home. This monitoring reduces the number of office visits required to check your device to one time per year. It allows us to keep an eye on the functioning of your device to ensure it is working properly. You are scheduled for a device check from home on 07/20/16. You may send your transmission at any time that day. If you have a wireless device, the transmission will be sent automatically. After your physician reviews your transmission, you will receive a postcard with your next transmission date.  Your physician wants you to follow-up in: 1 year with Dr. Taylor.  You will receive a reminder letter in the mail two months in advance. If you don't receive a letter, please call our office to schedule the follow-up appointment.  If you need a refill on your cardiac medications before your next appointment, please call your pharmacy.  Thank you for choosing [facility]!!         

## 2016-04-20 NOTE — Progress Notes (Signed)
HPI Danielle Mckinney returns today for followup. She is a pleasant 65 yo woman with an non-ischemic CM, S/P ICD implant, chronic systolic CHF, HTN, and chronic coumadin therapy. In the interim she has done well. She denies chest pain, shortness of breath, peripheral edema, syncope, or any ICD shocks. She remains active. No recent fevers or chills. . She has undergone echo to help guide our treatment. She has normalized LV function. She is not PPM dependent. Her ICD has been reprogrammed with all tachy therapies turned off. She is still BiV pacing.  Allergies  Allergen Reactions  . Codeine Nausea And Vomiting  . Lisinopril Cough  . Oxycodone Nausea And Vomiting  . Penicillins Nausea And Vomiting  . Sulfa Antibiotics Nausea And Vomiting     Current Outpatient Prescriptions  Medication Sig Dispense Refill  . carvedilol (COREG) 6.25 MG tablet take 1 and 1/2 tablets by mouth twice a day with meals 90 tablet 11  . Cholecalciferol (D3-1000) 1000 UNITS tablet Take 1,000 Units by mouth daily.    . fish oil-omega-3 fatty acids 1000 MG capsule Take 1,000 mg by mouth daily.     . fluticasone (FLONASE) 50 MCG/ACT nasal spray Place 2 sprays into the nose daily as needed for allergies or rhinitis.     . furosemide (LASIX) 40 MG tablet Take 1 tablet (40 mg total) by mouth daily as needed. For swelling 90 tablet 3  . losartan (COZAAR) 100 MG tablet take 1/2 tablet by mouth twice a day 60 tablet 0  . meclizine (ANTIVERT) 25 MG tablet take 1/2-1 tablet by mouth twice a day if needed  0  . pantoprazole (PROTONIX) 40 MG tablet Take 40 mg by mouth daily as needed (heartburn or acid reflux).     . potassium chloride SA (K-DUR,KLOR-CON) 20 MEQ tablet Take 1 tablet (20 mEq total) by mouth daily as needed. Takes along with Furosemide when needed. 30 tablet 3  . spironolactone (ALDACTONE) 25 MG tablet take 1 tablet by mouth once daily 30 tablet 3  . temazepam (RESTORIL) 15 MG capsule Take 15 mg by mouth at bedtime as needed.  For sleep    . warfarin (COUMADIN) 4 MG tablet take 1 and 1/2 tablets by mouth once daily as directed BY COUMADIN CLINIC 45 tablet 5   No current facility-administered medications for this visit.     Past Medical History  Diagnosis Date  . Atrial fibrillation ([place])   . CHF (congestive heart failure) ([place])   . HTN (hypertension)   . Stroke ([place])   . CIN I (cervical intraepithelial neoplasia I)   . Cancer (HCC)     Breast cancer-BRACA I gene  . Fibroid   . 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 Biventricular ICD (implantable cardioverter-defibrillator) in place     [place]  . Breast cancer ([place])     ROS:   All systems reviewed and negative except as noted in the HPI.   Past Surgical History  Procedure Laterality Date  . Biv icd genertaor change out  12/20/2011    [place]  . Mastectomy    . Transthoracic echocardiogram  08/14/06  . Gynecologic cryosurgery    . Colposcopy    . Tubal ligation    . Breast surgery      Bilateral mastctomy and reconstruction TRAM  . Cervical cone biopsy    . Cardiac defibrillator placement    . Oophorectomy      BSO  . US echocardiography  06/25/2012    borderline concentric LVH,LV systolic fx mildly reduced,impaired LV relaxation  . Biv icd genertaor change out N/A 12/20/2011    Procedure: BIV ICD GENERTAOR CHANGE OUT;  Surgeon: Evans Lance, MD;  Location: [facility] CATH LAB;  Service: Cardiovascular;  Laterality: N/A;     Family History  Problem Relation Age of Onset  . Coronary artery disease Other   . Breast cancer Sister     Age 34  . Hypertension Sister   . Hypertension Brother   . Heart disease Maternal Grandfather   . Clotting disorder Mother   . Cirrhosis Father      Social History   Social History  . Marital Status: Divorced    Spouse Name: N/A  . Number of Children: N/A  . Years of Education: N/A   Occupational History  . Not on file.   Social History Main Topics  . Smoking  status: Never Smoker   . Smokeless tobacco: Never Used  . Alcohol Use: No  . Drug Use: No  . Sexual Activity: Yes    Birth Control/ Protection: Surgical     Comment: interecourse age 71, sexual partners more than 5   Other Topics Concern  . Not on file   Social History Narrative     BP 136/82 mmHg  Pulse 74  Ht 5' 2.5" (1.588 m)  Wt 185 lb 12.8 oz (84.278 kg)  BMI 33.42 kg/m2  Physical Exam:  Well appearing middle-aged woman, NAD HEENT: Unremarkable Neck:  6 cm JVD, no thyromegally Back:  No CVA tenderness Lungs:  Clear with no wheezes, rales, or rhonchi. HEART:  Regular rate rhythm, no murmurs, no rubs, no clicks Abd:  soft, positive bowel sounds, no organomegally, no rebound, no guarding Ext:  2 plus pulses, no edema, no cyanosis, no clubbing Skin:  No rashes no nodules Neuro:  CN II through XII intact, motor grossly intact  EKG normal sinus rhythm with bi-ventricular pacing  DEVICE  Normal device function except for the ventricular noise as noted.  See PaceArt for details. Her tachy therapies have been turned off in the setting of normalization of her LV function.  Assess/Plan: 1. Chronic systolic heart failure - her symptoms are class2. She has had normalization of her LV function 2. HTN - Her blood pressure is a bit elevated today. She is encouraged to reduce her salt intake. 3. BiV ICD - her tachy therapies are turned off. Her device is working normally.   [name]

## 2016-04-21 ENCOUNTER — Encounter: Payer: Self-pay | Admitting: Cardiology

## 2016-05-01 NOTE — Addendum Note (Signed)
Addended by: Freada Bergeron on: 05/01/2016 05:17 PM   Modules accepted: Orders

## 2016-05-12 ENCOUNTER — Encounter: Payer: Medicare Other | Admitting: Gynecology

## 2016-05-16 ENCOUNTER — Ambulatory Visit (INDEPENDENT_AMBULATORY_CARE_PROVIDER_SITE_OTHER): Payer: Medicare Other | Admitting: Pharmacist

## 2016-05-16 ENCOUNTER — Other Ambulatory Visit: Payer: Self-pay | Admitting: Cardiovascular Disease

## 2016-05-16 DIAGNOSIS — I4891 Unspecified atrial fibrillation: Secondary | ICD-10-CM

## 2016-05-16 DIAGNOSIS — I639 Cerebral infarction, unspecified: Secondary | ICD-10-CM

## 2016-05-16 DIAGNOSIS — Z7901 Long term (current) use of anticoagulants: Secondary | ICD-10-CM

## 2016-05-16 LAB — POCT INR: INR: 2.8

## 2016-05-17 ENCOUNTER — Other Ambulatory Visit: Payer: Self-pay | Admitting: Cardiovascular Disease

## 2016-05-18 ENCOUNTER — Encounter: Payer: Medicare Other | Admitting: Gynecology

## 2016-06-06 ENCOUNTER — Encounter: Payer: Self-pay | Admitting: Gynecology

## 2016-06-06 ENCOUNTER — Ambulatory Visit (INDEPENDENT_AMBULATORY_CARE_PROVIDER_SITE_OTHER): Payer: Medicare Other | Admitting: Gynecology

## 2016-06-06 VITALS — BP 124/76 | Ht 62.0 in | Wt 186.0 lb

## 2016-06-06 DIAGNOSIS — R635 Abnormal weight gain: Secondary | ICD-10-CM

## 2016-06-06 DIAGNOSIS — D251 Intramural leiomyoma of uterus: Secondary | ICD-10-CM | POA: Diagnosis not present

## 2016-06-06 DIAGNOSIS — Z01419 Encounter for gynecological examination (general) (routine) without abnormal findings: Secondary | ICD-10-CM

## 2016-06-06 DIAGNOSIS — Z853 Personal history of malignant neoplasm of breast: Secondary | ICD-10-CM | POA: Diagnosis not present

## 2016-06-06 DIAGNOSIS — Z87410 Personal history of cervical dysplasia: Secondary | ICD-10-CM

## 2016-06-06 NOTE — Progress Notes (Signed)
[place] 22-Aug-1951 [id number]   History:    65 y.o.  for annual gyn exam who is having no complaints today. Her via her record indicated that she has a past history of breast cancer (bilateral mastectomy and reconstruction/tram/chemotherapy and bone marrow transplant) who had positive BRCA one and subsequently had bilateral salpingo-oophorectomy.  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 Subsequent Pap smears have been normal  The patient has been followed for small fibroids. Patient states that her shingles and Tdap vaccines are up to date. She also reports a normal colonoscopy 6 years ago.  Patient has 3 sisters of which one died of breast cancer and was never tested for the BRCA1 or BRCA2 gene mutation. The other 2 sisters were tested and were negative.  Patient had a normal bone density study in 2014 and 2016.   Past medical history,surgical history, family history and social history were all reviewed and documented in the EPIC chart.  Gynecologic History No LMP recorded. Patient is postmenopausal. Contraception: post menopausal status Last Pap: 2016. Results were: normal Last mammogram: Patient has had bilateral mastectomy but had a normal chest x-ray in 2016. Results were: See above  Obstetric History OB History  Gravida Para Term Preterm AB Living  '3 2 2   1 2  '$ SAB TAB Ectopic Multiple Live Births               # Outcome Date GA Lbr Len/2nd Weight Sex Delivery Anes PTL Lv  3 AB           2 Term           1 Term                ROS: A ROS was performed and pertinent positives and negatives are included in the history.  GENERAL: No fevers or chills. HEENT: No change in vision, no earache, sore throat or sinus congestion. NECK: No pain or stiffness. CARDIOVASCULAR: No chest pain or pressure. No  palpitations. PULMONARY: No shortness of breath, cough or wheeze. GASTROINTESTINAL: No abdominal pain, nausea, vomiting or diarrhea, melena or bright red blood per rectum. GENITOURINARY: No urinary frequency, urgency, hesitancy or dysuria. MUSCULOSKELETAL: No joint or muscle pain, no back pain, no recent trauma. DERMATOLOGIC: No rash, no itching, no lesions. ENDOCRINE: No polyuria, polydipsia, no heat or cold intolerance. No recent change in weight. HEMATOLOGICAL: No anemia or easy bruising or bleeding. NEUROLOGIC: No headache, seizures, numbness, tingling or weakness. PSYCHIATRIC: No depression, no loss of interest in normal activity or change in sleep pattern.     Exam: chaperone present  BP 124/76   Ht '5\' 2"'$  (1.575 m)   Wt 186 lb (84.4 kg)   BMI 34.02 kg/m   Body mass index is 34.02 kg/m.  General appearance : Well developed well nourished female. No acute distress HEENT: Eyes: no retinal hemorrhage or exudates,  Neck supple, trachea midline, no carotid bruits, no thyroidmegaly Lungs: Clear to auscultation, no rhonchi or wheezes, or rib retractions  Heart: Regular rate and rhythm, no murmurs or gallops Breast:Examined in sitting and supine position were symmetrical in appearance, no palpable masses or tenderness,  no skin retraction, no nipple inversion, no nipple discharge, no skin discoloration, no axillary or supraclavicular lymphadenopathy Abdomen: no palpable masses or tenderness, no rebound or guarding Extremities: no edema or skin  discoloration or tenderness  Pelvic:  Bartholin, Urethra, Skene Glands: Within normal limits             Vagina: No gross lesions or discharge, mild atrophic changes  Cervix: No gross lesions or discharge  Uterus  anteverted, normal size, shape and consistency, non-tender and mobile  Adnexa  Without masses or tenderness  Anus and perineum  normal   Rectovaginal  normal sphincter tone without palpated masses or tenderness             Hemoccult cards  will be provided     Assessment/Plan:  65 y.o. female for annual exam with past history of breast cancer doing well. Her PCP is doing her blood work.Her cardiologist is Dr.Croitoru who is monitoring her atrial fibrillation and hypertension. Pap smear was done today. Patient was reminded importance of calcium vitamin D and weightbearing As for osteoporosis prevention. Fecal Hemoccult cards provided for her dismissed to the office for testing.   Terrance Mass MD, 3:31 PM 06/06/2016

## 2016-06-06 NOTE — Addendum Note (Signed)
Addended by: Thurnell Garbe A on: 06/06/2016 04:35 PM   Modules accepted: Orders

## 2016-06-07 LAB — PAP IG W/ RFLX HPV ASCU

## 2016-06-13 ENCOUNTER — Ambulatory Visit (INDEPENDENT_AMBULATORY_CARE_PROVIDER_SITE_OTHER): Payer: Medicare Other | Admitting: Pharmacist

## 2016-06-13 DIAGNOSIS — Z7901 Long term (current) use of anticoagulants: Secondary | ICD-10-CM

## 2016-06-13 DIAGNOSIS — I4891 Unspecified atrial fibrillation: Secondary | ICD-10-CM

## 2016-06-13 DIAGNOSIS — I639 Cerebral infarction, unspecified: Secondary | ICD-10-CM

## 2016-06-13 LAB — POCT INR: INR: 2.4

## 2016-06-19 ENCOUNTER — Other Ambulatory Visit: Payer: Self-pay | Admitting: Cardiovascular Disease

## 2016-06-19 NOTE — Telephone Encounter (Signed)
Rx request sent to pharmacy.  

## 2016-06-27 ENCOUNTER — Other Ambulatory Visit: Payer: Medicare Other

## 2016-06-29 ENCOUNTER — Other Ambulatory Visit: Payer: Medicare Other

## 2016-07-04 ENCOUNTER — Ambulatory Visit: Payer: Medicare Other | Admitting: Oncology

## 2016-07-11 ENCOUNTER — Other Ambulatory Visit: Payer: Self-pay

## 2016-07-11 ENCOUNTER — Other Ambulatory Visit: Payer: Self-pay | Admitting: Anesthesiology

## 2016-07-11 DIAGNOSIS — C50912 Malignant neoplasm of unspecified site of left female breast: Secondary | ICD-10-CM

## 2016-07-11 DIAGNOSIS — Z1211 Encounter for screening for malignant neoplasm of colon: Secondary | ICD-10-CM

## 2016-07-12 ENCOUNTER — Ambulatory Visit (INDEPENDENT_AMBULATORY_CARE_PROVIDER_SITE_OTHER): Payer: Medicare Other | Admitting: Pharmacist

## 2016-07-12 ENCOUNTER — Other Ambulatory Visit (HOSPITAL_BASED_OUTPATIENT_CLINIC_OR_DEPARTMENT_OTHER): Payer: Medicare Other

## 2016-07-12 ENCOUNTER — Encounter: Payer: Self-pay | Admitting: Cardiovascular Disease

## 2016-07-12 ENCOUNTER — Ambulatory Visit (INDEPENDENT_AMBULATORY_CARE_PROVIDER_SITE_OTHER): Payer: Medicare Other | Admitting: Cardiovascular Disease

## 2016-07-12 VITALS — BP 124/74 | HR 82 | Ht 62.0 in | Wt 183.0 lb

## 2016-07-12 DIAGNOSIS — Z95810 Presence of automatic (implantable) cardiac defibrillator: Secondary | ICD-10-CM

## 2016-07-12 DIAGNOSIS — I428 Other cardiomyopathies: Secondary | ICD-10-CM

## 2016-07-12 DIAGNOSIS — Z7901 Long term (current) use of anticoagulants: Secondary | ICD-10-CM | POA: Diagnosis not present

## 2016-07-12 DIAGNOSIS — Z79899 Other long term (current) drug therapy: Secondary | ICD-10-CM

## 2016-07-12 DIAGNOSIS — I639 Cerebral infarction, unspecified: Secondary | ICD-10-CM | POA: Diagnosis not present

## 2016-07-12 DIAGNOSIS — I5032 Chronic diastolic (congestive) heart failure: Secondary | ICD-10-CM | POA: Diagnosis not present

## 2016-07-12 DIAGNOSIS — C50912 Malignant neoplasm of unspecified site of left female breast: Secondary | ICD-10-CM

## 2016-07-12 DIAGNOSIS — I10 Essential (primary) hypertension: Secondary | ICD-10-CM

## 2016-07-12 DIAGNOSIS — I4891 Unspecified atrial fibrillation: Secondary | ICD-10-CM

## 2016-07-12 DIAGNOSIS — I429 Cardiomyopathy, unspecified: Secondary | ICD-10-CM | POA: Diagnosis not present

## 2016-07-12 DIAGNOSIS — I480 Paroxysmal atrial fibrillation: Secondary | ICD-10-CM

## 2016-07-12 LAB — CBC WITH DIFFERENTIAL/PLATELET
BASO%: 0.1 % (ref 0.0–2.0)
Basophils Absolute: 0 10*3/uL (ref 0.0–0.1)
EOS%: 2 % (ref 0.0–7.0)
Eosinophils Absolute: 0.2 10*3/uL (ref 0.0–0.5)
HCT: 38.4 % (ref 34.8–46.6)
HGB: 12.9 g/dL (ref 11.6–15.9)
LYMPH%: 33.6 % (ref 14.0–49.7)
MCH: 26.2 pg (ref 25.1–34.0)
MCHC: 33.6 g/dL (ref 31.5–36.0)
MCV: 77.9 fL — ABNORMAL LOW (ref 79.5–101.0)
MONO#: 0.7 10*3/uL (ref 0.1–0.9)
MONO%: 8.8 % (ref 0.0–14.0)
NEUT%: 55.5 % (ref 38.4–76.8)
NEUTROS ABS: 4.1 10*3/uL (ref 1.5–6.5)
Platelets: 222 10*3/uL (ref 145–400)
RBC: 4.93 10*6/uL (ref 3.70–5.45)
RDW: 15.7 % — ABNORMAL HIGH (ref 11.2–14.5)
WBC: 7.4 10*3/uL (ref 3.9–10.3)
lymph#: 2.5 10*3/uL (ref 0.9–3.3)

## 2016-07-12 LAB — COMPREHENSIVE METABOLIC PANEL
AST: 13 U/L (ref 5–34)
Albumin: 3.9 g/dL (ref 3.5–5.0)
Alkaline Phosphatase: 85 U/L (ref 40–150)
Anion Gap: 10 mEq/L (ref 3–11)
BILIRUBIN TOTAL: 0.78 mg/dL (ref 0.20–1.20)
BUN: 21.8 mg/dL (ref 7.0–26.0)
CHLORIDE: 102 meq/L (ref 98–109)
CO2: 28 meq/L (ref 22–29)
CREATININE: 1.1 mg/dL (ref 0.6–1.1)
Calcium: 9.7 mg/dL (ref 8.4–10.4)
EGFR: 65 mL/min/{1.73_m2} — AB (ref >90)
GLUCOSE: 122 mg/dL (ref 70–140)
Potassium: 4.1 mEq/L (ref 3.5–5.1)
SODIUM: 139 meq/L (ref 136–145)
TOTAL PROTEIN: 8 g/dL (ref 6.4–8.3)

## 2016-07-12 LAB — POCT INR: INR: 3

## 2016-07-12 NOTE — Progress Notes (Signed)
Cardiology Office Note    Date:  07/12/2016   ID:  Danielle Mckinney, DOB 03-Dec-1950, MRN [id number]  PCP:  Jonathon Bellows, MD  Cardiologist:  Cristopher Peru, M.D.; Sanda Klein, MD   Chief Complaint  Patient presents with  . Follow-up    cramping legs, Pt states no other Sx.    History of Present Illness:  Danielle Mckinney is a 65 y.o. 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  Since her last appointment she has not had any problems with angina pectoris, palpitations, syncope, device related symptoms, leg edema, focal neurological events, bleeding problems. She has chronic NYHA functional class II-III dyspnea but remains remarkably active despite this. She has difficulty carrying weights such as carrying and loading the grocery bags in the car, but is able to enjoy line dancing and goes to [employer].  She just saw Dr. Lovena Le on June 22. Her blood pressure was a little high that day, but otherwise she was doing well. Device is retested today. Ventricular tachycardia detection and therapies are turned off due to artifact on the right ventricular sensing channel. RV pacing capture threshold and impedance remain excellent. Estimated battery longevity is about 3.0 years ([place] implanted 2013). She has 4% atrial pacing and 97% biventricular pacing (LV before RV 35 ms, LV bipolar). There has been no atrial fibrillation.  INR check today 3.0.  Past Medical History:  Diagnosis Date  . Atrial fibrillation ([place])   . Biventricular ICD (implantable cardioverter-defibrillator) in place    [place]  . Breast cancer ([place])   . Cancer (HCC)    Breast cancer-BRACA I gene  . CHF (congestive heart failure) ([place])   . CIN I (cervical intraepithelial neoplasia I)   . Fibroid   .  HTN (hypertension)   . Nonischemic cardiomyopathy ([place])    related to anthracycline chemotherapy  . Stroke [facility])     Past Surgical History:  Procedure Laterality Date  . BIV ICD GENERTAOR CHANGE OUT  12/20/2011   [place]  . BIV ICD GENERTAOR CHANGE OUT N/A 12/20/2011   Procedure: BIV ICD GENERTAOR CHANGE OUT;  Surgeon: Evans Lance, MD;  Location: [facility] CATH LAB;  Service: Cardiovascular;  Laterality: N/A;  . BREAST SURGERY     Bilateral mastctomy and reconstruction TRAM  . CARDIAC DEFIBRILLATOR PLACEMENT    . CERVICAL CONE BIOPSY    . COLPOSCOPY    . GYNECOLOGIC CRYOSURGERY    . MASTECTOMY    . OOPHORECTOMY     BSO  . TRANSTHORACIC ECHOCARDIOGRAM  08/14/06  . TUBAL LIGATION    . US ECHOCARDIOGRAPHY  06/25/2012   borderline concentric LVH,LV systolic fx mildly reduced,impaired LV relaxation    Current Medications: Outpatient Medications Prior to Visit  Medication Sig Dispense Refill  . Calcium-Magnesium-Vitamin D (CALCIUM 1200+D3 PO) Take by mouth.    . carvedilol (COREG) 6.25 MG tablet take 1 and 1/2 tablets by mouth twice a day with meals 90 tablet 11  . Cholecalciferol (D3-1000) 1000 UNITS tablet Take 1,000 Units by mouth daily.    . fish oil-omega-3 fatty acids 1000 MG capsule Take 1,000 mg by mouth daily.     . furosemide (LASIX) 40 MG tablet Take 1 tablet (40 mg total) by mouth daily as needed. For swelling 90 tablet 3  . losartan (COZAAR) 100 MG tablet take 1/2 tablet  by mouth twice a day 90 tablet 0  . meclizine (ANTIVERT) 25 MG tablet take 1/2-1 tablet by mouth twice a day if needed  0  . potassium chloride SA (K-DUR,KLOR-CON) 20 MEQ tablet Take 1 tablet (20 mEq total) by mouth daily as needed. Takes along with Furosemide when needed. 30 tablet 3  . spironolactone (ALDACTONE) 25 MG tablet take 1 tablet by mouth once daily 30 tablet 3  . temazepam (RESTORIL) 15 MG capsule Take 15 mg by mouth at bedtime as needed. For sleep    . warfarin (COUMADIN) 4 MG tablet take 1 and  1/2 tablets by mouth once daily as directed BY COUMADIN CLINIC 45 tablet 5  . fluticasone (FLONASE) 50 MCG/ACT nasal spray Place 2 sprays into the nose daily as needed for allergies or rhinitis.     . pantoprazole (PROTONIX) 40 MG tablet Take 40 mg by mouth daily as needed (heartburn or acid reflux).      No facility-administered medications prior to visit.      Allergies:   Codeine; Lisinopril; Oxycodone; Penicillins; and Sulfa antibiotics   Social History   Social History  . Marital status: Divorced    Spouse name: N/A  . Number of children: N/A  . Years of education: N/A   Social History Main Topics  . Smoking status: Never Smoker  . Smokeless tobacco: Never Used  . Alcohol use No  . Drug use: No  . Sexual activity: Yes    Birth control/ protection: Surgical     Comment: interecourse age 11, sexual partners more than 5   Other Topics Concern  . None   Social History Narrative  . None     Family History:  The patient's family history includes Breast cancer in her sister; Cirrhosis in her father; Clotting disorder in her mother; Coronary artery disease in her other; Heart disease in her maternal grandfather; Hypertension in her brother and sister.   ROS:   Please see the history of present illness.    ROS All other systems reviewed and are negative.   PHYSICAL EXAM:   VS:  BP 124/74   Pulse 82   Ht 5\' 2"  (1.575 m)   Wt 183 lb (83 kg)   BMI 33.47 kg/m    GEN: Well nourished, well developed, in no acute distress  HEENT: normal  Neck: no JVD, carotid bruits, or masses Cardiac: Paradoxically split S2, RRR; no murmurs, rubs, or gallops,no edema. The left subclavian defibrillator site appears healthy. There is however significant collateral venous circulation suggesting that the underlying vein may be occluded.  Respiratory:  clear to auscultation bilaterally, normal work of breathing GI: soft, nontender, nondistended, + BS MS: no deformity or atrophy  Skin: warm and  dry, no rash Neuro:  Alert and Oriented x 3, Strength and sensation are intact Psych: euthymic mood, full affect  Wt Readings from Last 3 Encounters:  07/12/16 183 lb (83 kg)  06/06/16 186 lb (84.4 kg)  04/20/16 185 lb 12.8 oz (84.3 kg)      Studies/Labs Reviewed:   EKG:  EKG is not ordered today.    Recent Labs: 07/12/2016: ALT <9; BUN 21.8; Creatinine 1.1; HGB 12.9; Platelets 222; Potassium 4.1; Sodium 139    ASSESSMENT:    1. Chronic diastolic CHF (congestive heart failure) ([place])   2. Automatic implantable cardioverter-defibrillator in situ   3. Paroxysmal atrial fibrillation (HCC)   4. Long term current use of anticoagulant therapy   5. Essential hypertension  6. Medication management      PLAN:  In order of problems listed above:  1. CHF: Well compensated, NYHA functional class II mostly, euvolemic on intermittent dosing of loop diuretics. CRT hyper responder. 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 2. CRT-P: Biventricular defibrillator reprogrammed without tachycardia therapies due to artifact on the ventricular channel. Reasonably good biventricular pacing deficiency. Normal right ventricular pacing thresholds. Note possible occlusion of the left subclavian vein as evidenced by collateral formation in the skin. Would avoid placing a new right ventricular lead unless absolutely necessary. 3. AFib: None detected recently. On appropriate warfarin anticoagulation without bleeding complication. CHADSVasc 5 (gender, hypertension, heart failure, previous stroke) 4. Warfarin: Note history of stroke. Even if atrial fibrillation has not occurred in a long time would continue anticoagulants. 5. HTN: Well controlled. She did not tolerate higher doses of beta blocker in the past.    Medication Adjustments/Labs and Tests Ordered: Current medicines are reviewed at length with the patient today.  Concerns regarding medicines are outlined above.  Medication changes, Labs and  Tests ordered today are listed in the Patient Instructions below. Patient Instructions  Medication Instructions: Dr Sallyanne Kuster recommends that you continue on your current medications as directed. Please refer to the Current Medication list given to you today.  Labwork: Your physician recommends that you return for lab work TODAY.  Testing/Procedures: NONE ORDERED  Follow-up: Dr Sallyanne Kuster recommends that you schedule a follow-up appointment in 12 months. You will receive a reminder letter in the mail two months in advance. If you don't receive a letter, please call our office to schedule the follow-up appointment.  If you need a refill on your cardiac medications before your next appointment, please call your pharmacy.    Signed, Sanda Klein, MD  07/12/2016 5:31 PM    [address], [place], [place]  [address] Phone: [phone]; Fax: [phone]

## 2016-07-12 NOTE — Patient Instructions (Signed)
Medication Instructions: Dr Croitoru recommends that you continue on your current medications as directed. Please refer to the Current Medication list given to you today.  Labwork: Your physician recommends that you return for lab work TODAY.  Testing/Procedures: NONE ORDERED  Follow-up: Dr Croitoru recommends that you schedule a follow-up appointment in 12 months. You will receive a reminder letter in the mail two months in advance. If you don't receive a letter, please call our office to schedule the follow-up appointment.  If you need a refill on your cardiac medications before your next appointment, please call your pharmacy. 

## 2016-07-13 ENCOUNTER — Telehealth: Payer: Self-pay | Admitting: Cardiovascular Disease

## 2016-07-13 LAB — COMPREHENSIVE METABOLIC PANEL
ALBUMIN: 4.4 g/dL (ref 3.6–5.1)
ALT: 9 U/L (ref 6–29)
AST: 17 U/L (ref 10–35)
Alkaline Phosphatase: 66 U/L (ref 33–130)
BUN: 21 mg/dL (ref 7–25)
CHLORIDE: 99 mmol/L (ref 98–110)
CO2: 25 mmol/L (ref 20–31)
Calcium: 9.7 mg/dL (ref 8.6–10.4)
Creat: 0.84 mg/dL (ref 0.50–0.99)
Glucose, Bld: 103 mg/dL — ABNORMAL HIGH (ref 65–99)
POTASSIUM: 4.1 mmol/L (ref 3.5–5.3)
SODIUM: 137 mmol/L (ref 135–146)
TOTAL PROTEIN: 7.7 g/dL (ref 6.1–8.1)
Total Bilirubin: 0.7 mg/dL (ref 0.2–1.2)

## 2016-07-13 LAB — MAGNESIUM: MAGNESIUM: 2.2 mg/dL (ref 1.5–2.5)

## 2016-07-13 NOTE — Telephone Encounter (Signed)
New message ° ° °Pt verbalized that he is returning call to rn °

## 2016-07-13 NOTE — Telephone Encounter (Signed)
Returned call to patient-pt made aware of results:  Notes Recorded by Sanda Klein, MD on 07/13/2016 at 8:23 AM EDT Labs are great, just a borderline glucose level.  Pt verbalized understanding.

## 2016-07-19 ENCOUNTER — Ambulatory Visit (HOSPITAL_BASED_OUTPATIENT_CLINIC_OR_DEPARTMENT_OTHER): Payer: Medicare Other | Admitting: Oncology

## 2016-07-19 VITALS — BP 138/58 | HR 71 | Temp 98.8°F | Resp 18 | Ht 62.0 in | Wt 183.0 lb

## 2016-07-19 DIAGNOSIS — C50911 Malignant neoplasm of unspecified site of right female breast: Secondary | ICD-10-CM

## 2016-07-19 DIAGNOSIS — C50912 Malignant neoplasm of unspecified site of left female breast: Secondary | ICD-10-CM | POA: Diagnosis not present

## 2016-07-19 DIAGNOSIS — I429 Cardiomyopathy, unspecified: Secondary | ICD-10-CM | POA: Diagnosis not present

## 2016-07-19 DIAGNOSIS — Z7901 Long term (current) use of anticoagulants: Secondary | ICD-10-CM | POA: Diagnosis not present

## 2016-07-19 DIAGNOSIS — D563 Thalassemia minor: Secondary | ICD-10-CM

## 2016-07-19 DIAGNOSIS — Z1501 Genetic susceptibility to malignant neoplasm of breast: Principal | ICD-10-CM

## 2016-07-19 NOTE — Progress Notes (Signed)
Danielle Mckinney  Telephone:([phone]) [phone] Fax:(336) [phone]     ID: Danielle Mckinney DOB: 09-Nov-1950  MR#: [id number]  KDX#:[id number]  Patient Care Team: Maurice Small, MD as PCP - General (Family Medicine) Chauncey Cruel, MD as Consulting Physician (Oncology) Sanda Klein, MD as Consulting Physician (Cardiology) Terrance Mass, MD as Consulting Physician (Gynecology) PCP: Jonathon Bellows, MD SU:  OTHER MD:  CHIEF COMPLAINT:  BRCA positive  Breast cancer  CURRENT TREATMENT:  observation   BREAST CANCER HISTORY:  Danielle Mckinney is referred back by her primary care physician, Dr. Justin Mend, because of a rise in her breast cancer tumor marker. To recap : Danielle Mckinney is status post bilateral breast cancers, not synchronous, and bilateral mastectomies with reconstruction. She had stage IV disease , and was treated aggressively with high-dose chemotherapy and I'll ago his stem cell transplant support at [facility] in 1998. She has remained disease-free since that time , but did have a significant cardiomyopathy, which now appears to have largely resolved. She also had a right body stroke , with minimal residuals.   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 One of her 3 sisters did die from breast cancer at an early age and one of the patient's 2 daughters has been found to carry the mutation.  Her breast cancer history is given in more detail below  INTERVAL HISTORY:  Danielle Mckinney returns today for evaluation of her remote breast cancer. She gives me no symptoms suggestive of disease recurrence.  REVIEW OF SYSTEMS: She recently returned from a cruise to the [place]. She just missed [place]. SHE GOES TO THE GYM FOR 5 DAYS A WEEK. SHE TRAVELS A LOT TO [place] WERE BOTH HER DAUGHTERS ARE NURSES AND/OR HER GRANDDAUGHTER JUST GRADUATED FROM NURSING SCHOOL. SHE FOLLOWS CLOSELY WITH DR. Anner Crete and tells me her heart is  "fine". She has some sinus issues, occasional palpitations, can be short of breath when climbing stairs, and bruises easily, but otherwise a detailed review of systems today was noncontributory.    PAST MEDICAL HISTORY: Past Medical History:  Diagnosis Date  . Atrial fibrillation ([place])   . Biventricular ICD (implantable cardioverter-defibrillator) in place    [facility]  . Breast cancer ([place])   . Cancer (HCC)    Breast cancer-BRACA I gene  . CHF (congestive heart failure) ([place])   . CIN I (cervical intraepithelial neoplasia I)   . Fibroid   . HTN (hypertension)   . 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 Stroke [facility])     PAST SURGICAL HISTORY: Past Surgical History:  Procedure Laterality Date  . BIV ICD GENERTAOR CHANGE OUT  12/20/2011   [place]  . BIV ICD GENERTAOR CHANGE OUT N/A 12/20/2011   Procedure: BIV ICD GENERTAOR CHANGE OUT;  Surgeon: Evans Lance, MD;  Location: [facility] CATH LAB;  Service: Cardiovascular;  Laterality: N/A;  . BREAST SURGERY     Bilateral mastctomy and reconstruction TRAM  . CARDIAC DEFIBRILLATOR PLACEMENT    . CERVICAL CONE BIOPSY    . COLPOSCOPY    . GYNECOLOGIC CRYOSURGERY    . MASTECTOMY    . OOPHORECTOMY     BSO  . TRANSTHORACIC ECHOCARDIOGRAM  08/14/06  . TUBAL LIGATION    . US ECHOCARDIOGRAPHY  06/25/2012   borderline concentric LVH,LV systolic fx mildly reduced,impaired LV relaxation    FAMILY HISTORY Family  History  Problem Relation Age of Onset  . Breast cancer Sister     Age 62  . Hypertension Sister   . Heart disease Maternal Grandfather   . Clotting disorder Mother   . Cirrhosis Father   . Coronary artery disease Other   . Hypertension Brother    the patient's father died in his late 62s from cirrhosis of the liver. The patient's mother died at the age of 63 secondary to a blood clot. Danielle Mckinney had 3 brothers, 3 sisters. One sister died from breast cancer. But Danielle Mckinney does not recall the exact age of  diagnosis. One paternal aunt died with breast and ovarian cancer. The patient is known to be BRCA positive as is one of her 2 daughters  GYNECOLOGIC HISTORY:  No LMP recorded. Patient is postmenopausal.  menarche age 56, first live birth age 20 as she is GX P2. She had a total abdominal hysterectomy with bilateral salpingo-oophorectomy  SOCIAL HISTORY:   she is divorced and lives alone, with no pets. Her daughter Danielle Mckinney and younger daughter Danielle Mckinney live in [place], where they worked as R in his. The patient has 3 grandchildren.    ADVANCED DIRECTIVES:  In place; her daughter Danielle Mckinney is her healthcare power of attorney   HEALTH MAINTENANCE: Social History  Substance Use Topics  . Smoking status: Never Smoker  . Smokeless tobacco: Never Used  . Alcohol use No     Colonoscopy: 2010/[facility]  PAP: July 2016  Bone density: 05/27/2015/normal  Lipid panel:  Allergies  Allergen Reactions  . Codeine Nausea And Vomiting  . Lisinopril Cough  . Oxycodone Nausea And Vomiting  . Penicillins Nausea And Vomiting  . Sulfa Antibiotics Nausea And Vomiting    Current Outpatient Prescriptions  Medication Sig Dispense Refill  . Calcium-Magnesium-Vitamin D (CALCIUM 1200+D3 PO) Take by mouth.    . carvedilol (COREG) 6.25 MG tablet take 1 and 1/2 tablets by mouth twice a day with meals 90 tablet 11  . Cholecalciferol (D3-1000) 1000 UNITS tablet Take 1,000 Units by mouth daily.    . fish oil-omega-3 fatty acids 1000 MG capsule Take 1,000 mg by mouth daily.     . furosemide (LASIX) 40 MG tablet Take 1 tablet (40 mg total) by mouth daily as needed. For swelling 90 tablet 3  . losartan (COZAAR) 100 MG tablet take 1/2 tablet by mouth twice a day 90 tablet 0  . meclizine (ANTIVERT) 25 MG tablet take 1/2-1 tablet by mouth twice a day if needed  0  . potassium chloride SA (K-DUR,KLOR-CON) 20 MEQ tablet Take 1 tablet (20 mEq total) by mouth daily as needed. Takes along with Furosemide when needed. 30 tablet 3    . spironolactone (ALDACTONE) 25 MG tablet take 1 tablet by mouth once daily 30 tablet 3  . temazepam (RESTORIL) 15 MG capsule Take 15 mg by mouth at bedtime as needed. For sleep    . warfarin (COUMADIN) 4 MG tablet take 1 and 1/2 tablets by mouth once daily as directed BY COUMADIN CLINIC 45 tablet 5   No current facility-administered medications for this visit.     OBJECTIVE:  nBP: (!) 138/58  Pulse: 71  Resp: 18  Temp: 98.8 F (37.1 C)     Body mass index is 33.47 kg/m.    ECOG FS:0 - Asymptomatic  Sclerae unicteric, EOMs intact Oropharynx clear and moist No cervical or supraclavicular adenopathy Lungs no rales or rhonchi Heart regular rate and rhythm Abd soft, nontender, positive bowel sounds  MSK no focal spinal tenderness, no upper extremity lymphedema Neuro: nonfocal, well oriented, appropriate affect Breasts: Status post bilateral mastectomies, bilateral reconstruction, and bilateral radiation. There is no evidence of chest wall recurrence. Both axillae are benign.   LAB RESULTS:  CA 27.29 <38 U/mL 29  <38 U/mL" class="[id number]"> 26CM     CMP     Component Value Date/Time   NA 137 07/12/2016 1619   NA 139 07/12/2016 1449   K 4.1 07/12/2016 1619   K 4.1 07/12/2016 1449   CL 99 07/12/2016 1619   CO2 25 07/12/2016 1619   CO2 28 07/12/2016 1449   GLUCOSE 103 (H) 07/12/2016 1619   GLUCOSE 122 07/12/2016 1449   BUN 21 07/12/2016 1619   BUN 21.8 07/12/2016 1449   CREATININE 0.84 07/12/2016 1619   CREATININE 1.1 07/12/2016 1449   CALCIUM 9.7 07/12/2016 1619   CALCIUM 9.7 07/12/2016 1449   PROT 7.7 07/12/2016 1619   PROT 8.0 07/12/2016 1449   ALBUMIN 4.4 07/12/2016 1619   ALBUMIN 3.9 07/12/2016 1449   AST 17 07/12/2016 1619   AST 13 07/12/2016 1449   ALT 9 07/12/2016 1619   ALT <9 07/12/2016 1449   ALKPHOS 66 07/12/2016 1619   ALKPHOS 85 07/12/2016 1449   BILITOT 0.7 07/12/2016 1619   BILITOT 0.78 07/12/2016 1449   GFRNONAA >60 04/14/2015 1154   GFRAA  >60 04/14/2015 1154    INo results found for: SPEP, UPEP  Lab Results  Component Value Date   WBC 7.4 07/12/2016   NEUTROABS 4.1 07/12/2016   HGB 12.9 07/12/2016   HCT 38.4 07/12/2016   MCV 77.9 (L) 07/12/2016   PLT 222 07/12/2016      Chemistry      Component Value Date/Time   NA 137 07/12/2016 1619   NA 139 07/12/2016 1449   K 4.1 07/12/2016 1619   K 4.1 07/12/2016 1449   CL 99 07/12/2016 1619   CO2 25 07/12/2016 1619   CO2 28 07/12/2016 1449   BUN 21 07/12/2016 1619   BUN 21.8 07/12/2016 1449   CREATININE 0.84 07/12/2016 1619   CREATININE 1.1 07/12/2016 1449      Component Value Date/Time   CALCIUM 9.7 07/12/2016 1619   CALCIUM 9.7 07/12/2016 1449   ALKPHOS 66 07/12/2016 1619   ALKPHOS 85 07/12/2016 1449   AST 17 07/12/2016 1619   AST 13 07/12/2016 1449   ALT 9 07/12/2016 1619   ALT <9 07/12/2016 1449   BILITOT 0.7 07/12/2016 1619   BILITOT 0.78 07/12/2016 1449       Lab Results  Component Value Date   LABCA2 29 03/28/2016    No components found for: LABCA125   Recent Labs Lab 07/12/16 1535  INR 3.0    Urinalysis    Component Value Date/Time   COLORURINE YELLOW 08/08/2008 1859   APPEARANCEUR CLEAR 08/08/2008 1859   LABSPEC 1.012 08/08/2008 1859   PHURINE 6.0 08/08/2008 1859   GLUCOSEU NEGATIVE 08/08/2008 1859   HGBUR NEGATIVE 08/08/2008 1859   BILIRUBINUR NEGATIVE 08/08/2008 1859   KETONESUR NEGATIVE 08/08/2008 1859   PROTEINUR NEGATIVE 08/08/2008 1859   UROBILINOGEN 0.2 08/08/2008 1859   NITRITE NEGATIVE 08/08/2008 1859   LEUKOCYTESUR  08/08/2008 1859    NEGATIVE MICROSCOPIC NOT DONE ON URINES WITH NEGATIVE PROTEIN, BLOOD, LEUKOCYTES, NITRITE, OR GLUCOSE <1000 mg/dL.    STUDIES: No results found.  ASSESSMENT: 65 y.o. BRCA mutation positive thank you Max woman  (1) s/p Right lumpectomy in 1990 for a Stage  II breast cancer treated with doxorubicin, cyclophosphamide and 5- fluorouracil, followed by radiation.  (2) Left  lumpectomy in April of 1998 for a "Stage IV" tumor (she had left supraclavicular involvement which currently would be staged as III-C),  Estrogen and progesterone receptor negative, HER2 unknown  (a) treated with doxorubicin and paclitaxel followed by high-dose chemotherapy at [facility] with stem cell rescue  in December of 1998   (b) freceived radiation to the left supraclavicular, left axillary and left chest wall areas, completed March of 1999   (3) brca POSITIVE:  (a) urinary the patient's daughter Danielle Mckinney carries the mutation (on status post TAH-BSO 2000  (b)   (4) s/p bilateral mastectomies with flap reconstruction January 2000 with no evidence of residual disease.  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  (a)  on lifelong anticoagulation  (7) alpha thalassemia trait   PLAN:  Danielle Mckinney is now 29 years out from her most recent evidence of breast cancer with no evidence of disease recurrence. This is very favorable.  Her CA-27-29 remains in the normal range. I would be comfortable checking his only on a once a year basis.  At this point I will see her again a year from now, with labs a week before that visit.  She knows to call for any other problems that may develop before  Chauncey Cruel, MD   07/19/2016 2:58 PM Medical Oncology and Hematology [facility] [place], [place] [address] Tel. [phone]    Fax. [phone]

## 2016-07-21 ENCOUNTER — Other Ambulatory Visit: Payer: Self-pay | Admitting: Cardiovascular Disease

## 2016-08-01 ENCOUNTER — Ambulatory Visit (INDEPENDENT_AMBULATORY_CARE_PROVIDER_SITE_OTHER): Payer: Medicare Other | Admitting: Pharmacist Clinician (PhC)/ Clinical Pharmacy Specialist

## 2016-08-01 DIAGNOSIS — I639 Cerebral infarction, unspecified: Secondary | ICD-10-CM

## 2016-08-01 DIAGNOSIS — I4891 Unspecified atrial fibrillation: Secondary | ICD-10-CM

## 2016-08-01 DIAGNOSIS — Z7901 Long term (current) use of anticoagulants: Secondary | ICD-10-CM | POA: Diagnosis not present

## 2016-08-01 LAB — POCT INR: INR: 2.1

## 2016-08-29 ENCOUNTER — Ambulatory Visit (INDEPENDENT_AMBULATORY_CARE_PROVIDER_SITE_OTHER): Payer: Medicare Other | Admitting: Pharmacist

## 2016-08-29 DIAGNOSIS — I639 Cerebral infarction, unspecified: Secondary | ICD-10-CM

## 2016-08-29 DIAGNOSIS — Z7901 Long term (current) use of anticoagulants: Secondary | ICD-10-CM

## 2016-08-29 DIAGNOSIS — I4891 Unspecified atrial fibrillation: Secondary | ICD-10-CM

## 2016-08-29 LAB — POCT INR: INR: 2.4

## 2016-09-08 LAB — CUP PACEART INCLINIC DEVICE CHECK
Date Time Interrogation Session: 20171110151849
Implantable Lead Implant Date: 20080307
Implantable Lead Location: 753859
Implantable Lead Model: 7001
Implantable Pulse Generator Implant Date: 20130220
Lead Channel Setting Pacing Amplitude: 2 V
Lead Channel Setting Pacing Pulse Width: 0.5 ms
Lead Channel Setting Pacing Pulse Width: 0.8 ms
Lead Channel Setting Sensing Sensitivity: 0.5 mV
MDC IDC LEAD IMPLANT DT: 20080307
MDC IDC LEAD IMPLANT DT: 20080307
MDC IDC LEAD LOCATION: 753858
MDC IDC LEAD LOCATION: 753860
MDC IDC PG SERIAL: 7027976
MDC IDC SET LEADCHNL LV PACING AMPLITUDE: 1.75 V
MDC IDC SET LEADCHNL RA PACING AMPLITUDE: 1.5 V

## 2016-10-09 ENCOUNTER — Ambulatory Visit (INDEPENDENT_AMBULATORY_CARE_PROVIDER_SITE_OTHER): Payer: Medicare Other | Admitting: Pharmacist Clinician (PhC)/ Clinical Pharmacy Specialist

## 2016-10-09 DIAGNOSIS — I639 Cerebral infarction, unspecified: Secondary | ICD-10-CM | POA: Diagnosis not present

## 2016-10-09 DIAGNOSIS — I4891 Unspecified atrial fibrillation: Secondary | ICD-10-CM | POA: Diagnosis not present

## 2016-10-09 DIAGNOSIS — Z7901 Long term (current) use of anticoagulants: Secondary | ICD-10-CM | POA: Diagnosis not present

## 2016-10-09 LAB — POCT INR: INR: 2.4

## 2016-10-10 ENCOUNTER — Other Ambulatory Visit: Payer: Self-pay | Admitting: Cardiovascular Disease

## 2016-10-10 NOTE — Telephone Encounter (Signed)
REFILL 

## 2016-10-16 ENCOUNTER — Ambulatory Visit (INDEPENDENT_AMBULATORY_CARE_PROVIDER_SITE_OTHER): Payer: Medicare Other | Admitting: *Deleted

## 2016-10-16 DIAGNOSIS — I428 Other cardiomyopathies: Secondary | ICD-10-CM

## 2016-10-17 LAB — CUP PACEART REMOTE DEVICE CHECK
Battery Remaining Longevity: 34 mo
Battery Voltage: 2.9 V
Brady Statistic AP VP Percent: 4.8 %
Brady Statistic AP VS Percent: 1 %
Brady Statistic AS VP Percent: 93 %
Brady Statistic RA Percent Paced: 4.7 %
HIGH POWER IMPEDANCE MEASURED VALUE: 49 Ohm
Implantable Lead Implant Date: 20080307
Implantable Lead Implant Date: 20080307
Implantable Lead Implant Date: 20080307
Implantable Lead Location: 753859
Implantable Lead Model: 7001
Implantable Pulse Generator Implant Date: 20130220
Lead Channel Impedance Value: 410 Ohm
Lead Channel Impedance Value: 710 Ohm
Lead Channel Pacing Threshold Amplitude: 0.75 V
Lead Channel Pacing Threshold Amplitude: 0.75 V
Lead Channel Pacing Threshold Pulse Width: 0.5 ms
Lead Channel Pacing Threshold Pulse Width: 0.8 ms
Lead Channel Sensing Intrinsic Amplitude: 11.3 mV
Lead Channel Setting Pacing Amplitude: 1.5 V
Lead Channel Setting Sensing Sensitivity: 0.5 mV
MDC IDC LEAD LOCATION: 753858
MDC IDC LEAD LOCATION: 753860
MDC IDC MSMT BATTERY REMAINING PERCENTAGE: 39 %
MDC IDC MSMT LEADCHNL RA IMPEDANCE VALUE: 300 Ohm
MDC IDC MSMT LEADCHNL RA PACING THRESHOLD AMPLITUDE: 0.5 V
MDC IDC MSMT LEADCHNL RA PACING THRESHOLD PULSEWIDTH: 0.5 ms
MDC IDC MSMT LEADCHNL RA SENSING INTR AMPL: 5 mV
MDC IDC PG SERIAL: 7027976
MDC IDC SESS DTM: 20171218095654
MDC IDC SET LEADCHNL LV PACING AMPLITUDE: 1.75 V
MDC IDC SET LEADCHNL LV PACING PULSEWIDTH: 0.8 ms
MDC IDC SET LEADCHNL RV PACING AMPLITUDE: 2 V
MDC IDC SET LEADCHNL RV PACING PULSEWIDTH: 0.5 ms
MDC IDC STAT BRADY AS VS PERCENT: 1.1 %

## 2016-10-17 NOTE — Progress Notes (Signed)
Remote ICD transmission.   

## 2016-10-18 ENCOUNTER — Other Ambulatory Visit: Payer: Self-pay | Admitting: Cardiovascular Disease

## 2016-10-18 ENCOUNTER — Encounter: Payer: Self-pay | Admitting: Cardiology

## 2016-10-27 ENCOUNTER — Telehealth: Payer: Self-pay

## 2016-10-27 NOTE — Telephone Encounter (Signed)
Referred to ICM clinic by Debroah Loop, device RN/Dr Croitoru.  Attempted ICM intro call and left message for call back.

## 2016-11-03 NOTE — Telephone Encounter (Signed)
Patient left voice mail message and I returned call. ICM intro given and she agreed to monthly follow up.  1st ICM remote transmission scheduled for 11/23/2016 and explained program.  She currently has the flu but is getting better.  She has ICM number and encouraged her to call if she develops fluid symptoms.

## 2016-11-13 ENCOUNTER — Other Ambulatory Visit: Payer: Self-pay | Admitting: Cardiovascular Disease

## 2016-11-17 ENCOUNTER — Other Ambulatory Visit: Payer: Self-pay | Admitting: Cardiovascular Disease

## 2016-11-21 ENCOUNTER — Ambulatory Visit (INDEPENDENT_AMBULATORY_CARE_PROVIDER_SITE_OTHER): Payer: Medicare Other | Admitting: Pharmacist Clinician (PhC)/ Clinical Pharmacy Specialist

## 2016-11-21 DIAGNOSIS — I639 Cerebral infarction, unspecified: Secondary | ICD-10-CM

## 2016-11-21 DIAGNOSIS — Z7901 Long term (current) use of anticoagulants: Secondary | ICD-10-CM | POA: Diagnosis not present

## 2016-11-21 DIAGNOSIS — I4891 Unspecified atrial fibrillation: Secondary | ICD-10-CM | POA: Diagnosis not present

## 2016-11-21 LAB — POCT INR: INR: 2.7

## 2016-11-23 ENCOUNTER — Ambulatory Visit (INDEPENDENT_AMBULATORY_CARE_PROVIDER_SITE_OTHER): Payer: Medicare Other

## 2016-11-23 DIAGNOSIS — Z95810 Presence of automatic (implantable) cardiac defibrillator: Secondary | ICD-10-CM

## 2016-11-23 DIAGNOSIS — I5032 Chronic diastolic (congestive) heart failure: Secondary | ICD-10-CM | POA: Diagnosis not present

## 2016-11-23 NOTE — Progress Notes (Signed)
EPIC Encounter for ICM Monitoring  Patient Name: Danielle Mckinney is a 66 y.o. female Date: 11/23/2016 Primary Care Physican: Jonathon Bellows, MD Primary Cardiologist: Croitoru Electrophysiologist: Druscilla Brownie Weight: 181 lbs  Bi-V Pacing:  98%       1st ICM encounter.  Heart Failure questions reviewed, pt hands feel tight.   Does not weigh daily. Encouraged to weigh daily after emptying bladder upon rising in the mornings with same amount of clothes on.  Thoracic impedance abnormal suggesting fluid accumulation since 1/23.  Also abnormal on the dates of 11/10/2016 to 11/17/2016.  Recommendations:   Advised to take PRN Furosemide 40 mg 1 tablet daily x 3 days along with Potassium 20 mEq 1 tablet daily x 3 days and then return to prn for both Furosemide and Potassium.  Discussed limiting dietary salt intake to 2000 mg/day. Provided ICM direct number.   Follow-up plan: ICM clinic phone appointment on 12/01/2016 to recheck fluid levels.  Copy of ICM check sent to primary cardiologist and device physician.   3 month ICM trend: 11/23/2016   1 Year ICM trend:      [name], RN 11/23/2016 3:44 PM

## 2016-11-23 NOTE — Progress Notes (Signed)
Thanks MCr 

## 2016-12-01 ENCOUNTER — Ambulatory Visit (INDEPENDENT_AMBULATORY_CARE_PROVIDER_SITE_OTHER): Payer: Medicare Other

## 2016-12-01 DIAGNOSIS — I5032 Chronic diastolic (congestive) heart failure: Secondary | ICD-10-CM

## 2016-12-01 DIAGNOSIS — Z95810 Presence of automatic (implantable) cardiac defibrillator: Secondary | ICD-10-CM

## 2016-12-01 NOTE — Progress Notes (Signed)
EPIC Encounter for ICM Monitoring  Patient Name: Danielle Mckinney is a 66 y.o. female Date: 12/01/2016 Primary Care Physican: Jonathon Bellows, MD Primary Cardiologist: Croitoru Electrophysiologist: Druscilla Brownie Weight:    178 lbs  Bi-V Pacing:  98%           Heart Failure questions reviewed, pt now asymptomatic.  Swelling of hands resolved and lost 3 lbs.   Thoracic impedance returned to normal after taking 3 days of prn Furosemide and Potassium and will take as needed.    Recommendations: No changes. Reminded to limit dietary salt intake to 2000 mg/day and fluid intake to < 2 liters/day. Encouraged to call for fluid symptoms.  Follow-up plan: ICM clinic phone appointment on 01/01/2017.  Copy of ICM check sent to device physician.   3 month ICM trend: 12/01/2016   1 Year ICM trend:      [name], RN 12/01/2016 8:48 AM

## 2016-12-16 ENCOUNTER — Other Ambulatory Visit: Payer: Self-pay | Admitting: Cardiovascular Disease

## 2016-12-18 ENCOUNTER — Other Ambulatory Visit: Payer: Self-pay

## 2016-12-18 MED ORDER — LOSARTAN POTASSIUM 100 MG PO TABS: 50.0000 mg | ORAL_TABLET | Freq: Two times a day (BID) | ORAL | 2 refills | Status: DC

## 2016-12-18 NOTE — Telephone Encounter (Signed)
Rx(s) sent to pharmacy electronically.  

## 2016-12-18 NOTE — Telephone Encounter (Signed)
Received voice mail message from patient requesting a refill on Losartan. She has 2 pills left and unsure if [employer] has contacted the office.  Request routed to [facility] refill department.

## 2016-12-18 NOTE — Addendum Note (Signed)
Addended by: Fidel Levy on: 12/18/2016 11:43 AM   Modules accepted: Orders

## 2017-01-01 ENCOUNTER — Ambulatory Visit (INDEPENDENT_AMBULATORY_CARE_PROVIDER_SITE_OTHER): Payer: Medicare Other

## 2017-01-01 DIAGNOSIS — I5032 Chronic diastolic (congestive) heart failure: Secondary | ICD-10-CM | POA: Diagnosis not present

## 2017-01-01 DIAGNOSIS — Z95810 Presence of automatic (implantable) cardiac defibrillator: Secondary | ICD-10-CM

## 2017-01-01 NOTE — Progress Notes (Signed)
EPIC Encounter for ICM Monitoring  Patient Name: Danielle Mckinney is a 66 y.o. female Date: 01/01/2017 Primary Care Physican: Jonathon Bellows, MD Primary Cardiologist: Croitoru Electrophysiologist: Lovena Le Dry Weight:178lbs  Bi-V Pacing:  98%       Heart Failure questions reviewed, pt asymptomatic now but did have some hand swelling during decreased impedance.  She took 3 days of prn Furosemide for swelling which resolved..   Thoracic impedance normal but was abnormal suggesting fluid accumulation 12/27/2016 to 12/30/2016 and 12/01/2016 to 12/06/2016.  Prescribed and confirmed dosage: Furosemide 40 mg 1 tablet as needed and Potassium 20 mEq 1 tablet to be taken when taking Furosemide.  Recommendations: No changes. Reminded to limit dietary salt intake to 2000 mg/day and fluid intake to < 2 liters/day. Encouraged to call for fluid symptoms.  Follow-up plan: ICM clinic phone appointment on 02/01/2017.  Copy of ICM check sent to device physician.   3 month ICM trend: 01/01/2017   1 Year ICM trend:      [name], RN 01/01/2017 8:35 AM

## 2017-01-02 ENCOUNTER — Ambulatory Visit (INDEPENDENT_AMBULATORY_CARE_PROVIDER_SITE_OTHER): Payer: Medicare Other | Admitting: Pharmacist Clinician (PhC)/ Clinical Pharmacy Specialist

## 2017-01-02 DIAGNOSIS — I4891 Unspecified atrial fibrillation: Secondary | ICD-10-CM | POA: Diagnosis not present

## 2017-01-02 DIAGNOSIS — I639 Cerebral infarction, unspecified: Secondary | ICD-10-CM

## 2017-01-02 DIAGNOSIS — Z7901 Long term (current) use of anticoagulants: Secondary | ICD-10-CM

## 2017-01-02 LAB — POCT INR: INR: 2.2

## 2017-02-01 ENCOUNTER — Ambulatory Visit (INDEPENDENT_AMBULATORY_CARE_PROVIDER_SITE_OTHER): Payer: Medicare Other | Admitting: *Deleted

## 2017-02-01 DIAGNOSIS — I428 Other cardiomyopathies: Secondary | ICD-10-CM

## 2017-02-01 DIAGNOSIS — I5032 Chronic diastolic (congestive) heart failure: Secondary | ICD-10-CM

## 2017-02-01 DIAGNOSIS — Z95810 Presence of automatic (implantable) cardiac defibrillator: Secondary | ICD-10-CM | POA: Diagnosis not present

## 2017-02-01 LAB — CUP PACEART REMOTE DEVICE CHECK
Battery Remaining Longevity: 31 mo
Battery Remaining Percentage: 36 %
Battery Voltage: 2.89 V
Brady Statistic AP VP Percent: 4.5 %
Brady Statistic AP VS Percent: 1 %
Brady Statistic AS VP Percent: 94 %
Brady Statistic AS VS Percent: 1 %
Brady Statistic RA Percent Paced: 4.4 %
Date Time Interrogation Session: 20180405083138
HighPow Impedance: 46 Ohm
Implantable Lead Implant Date: 20080307
Implantable Lead Implant Date: 20080307
Implantable Lead Implant Date: 20080307
Implantable Lead Location: 753858
Implantable Lead Location: 753859
Implantable Lead Location: 753860
Implantable Lead Model: 7001
Implantable Pulse Generator Implant Date: 20130220
Lead Channel Impedance Value: 310 Ohm
Lead Channel Impedance Value: 460 Ohm
Lead Channel Impedance Value: 690 Ohm
Lead Channel Pacing Threshold Amplitude: 0.5 V
Lead Channel Pacing Threshold Amplitude: 0.625 V
Lead Channel Pacing Threshold Amplitude: 0.75 V
Lead Channel Pacing Threshold Pulse Width: 0.5 ms
Lead Channel Pacing Threshold Pulse Width: 0.5 ms
Lead Channel Pacing Threshold Pulse Width: 0.8 ms
Lead Channel Sensing Intrinsic Amplitude: 11.3 mV
Lead Channel Sensing Intrinsic Amplitude: 5 mV
Lead Channel Setting Pacing Amplitude: 1.5 V
Lead Channel Setting Pacing Amplitude: 1.75 V
Lead Channel Setting Pacing Amplitude: 2 V
Lead Channel Setting Pacing Pulse Width: 0.5 ms
Lead Channel Setting Pacing Pulse Width: 0.8 ms
Lead Channel Setting Sensing Sensitivity: 0.5 mV
Pulse Gen Serial Number: 7027976

## 2017-02-01 NOTE — Progress Notes (Signed)
Remote ICD transmission.   

## 2017-02-01 NOTE — Progress Notes (Signed)
EPIC Encounter for ICM Monitoring  Patient Name: Danielle Mckinney is a 66 y.o. female Date: 02/01/2017 Primary Care Physican: Jonathon Bellows, MD Primary Cardiologist: Croitoru Electrophysiologist: Lovena Le Dry Weight:178lbs  Bi-V Pacing:  98%         Heart Failure questions reviewed, pt asymptomatic.   Thoracic impedance normal but was abnormal suggesting fluid accumulation from 01/10/2017 to 01/17/2017 and 01/24/2017 to 01/28/2017.  She reported being out of town on decreased impedance days and had forgotten to take the Furosemide with her.   Prescribed and confirmed dosage: Furosemide 40 mg 1 tablet as needed and Potassium 20 mEq 1 tablet to be taken when taking Furosemide.  Recommendations: No changes. Reminded to limit dietary salt intake to 2000 mg/day and fluid intake to < 2 liters/day. Encouraged to call for fluid symptoms.  Follow-up plan: ICM clinic phone appointment on 03/06/2017.  Office appointment scheduled on 04/20/2017 with Dr Lovena Le.  Copy of ICM check sent to device physician.   3 month ICM trend: 02/01/2017   1 Year ICM trend:      [name], RN 02/01/2017 3:30 PM

## 2017-02-02 ENCOUNTER — Encounter: Payer: Self-pay | Admitting: Cardiology

## 2017-02-16 ENCOUNTER — Ambulatory Visit (INDEPENDENT_AMBULATORY_CARE_PROVIDER_SITE_OTHER): Payer: Medicare Other | Admitting: Pharmacist Clinician (PhC)/ Clinical Pharmacy Specialist

## 2017-02-16 DIAGNOSIS — Z7901 Long term (current) use of anticoagulants: Secondary | ICD-10-CM

## 2017-02-16 DIAGNOSIS — I4891 Unspecified atrial fibrillation: Secondary | ICD-10-CM

## 2017-02-16 DIAGNOSIS — I639 Cerebral infarction, unspecified: Secondary | ICD-10-CM | POA: Diagnosis not present

## 2017-02-16 LAB — POCT INR: INR: 1.9

## 2017-03-06 ENCOUNTER — Ambulatory Visit (INDEPENDENT_AMBULATORY_CARE_PROVIDER_SITE_OTHER): Payer: Medicare Other

## 2017-03-06 DIAGNOSIS — I5032 Chronic diastolic (congestive) heart failure: Secondary | ICD-10-CM

## 2017-03-06 DIAGNOSIS — Z95810 Presence of automatic (implantable) cardiac defibrillator: Secondary | ICD-10-CM | POA: Diagnosis not present

## 2017-03-06 NOTE — Progress Notes (Signed)
EPIC Encounter for ICM Monitoring  Patient Name: Danielle Mckinney is a 67 y.o. female Date: 03/06/2017 Primary Care Physican: Maurice Small, MD Primary Cardiologist: Croitoru Electrophysiologist: Lovena Le Dry Weight:178lbs  Bi-V Pacing: 98%       Heart Failure questions reviewed, pt asymptomatic.   Thoracic impedance normal.  Prescribed and confirmed dosage: Furosemide 40 mg 1 tablet as needed and Potassium 20 mEq 1 tablet to be taken when taking Furosemide.  Recommendations: No changes. Discussed to limit salt intake to 2000 mg/day and fluid intake to < 2 liters/day.  Encouraged to call for fluid symptoms or use local ER for any urgent symptoms.  Follow-up plan: ICM clinic phone appointment on 04/06/2017.  Defib office appointment scheduled on 04/20/2017 with Dr Lovena Le.  Copy of ICM check sent to device physician.   3 month ICM trend: 03/06/2017   1 Year ICM trend:      [name], RN 03/06/2017 7:56 AM

## 2017-03-14 ENCOUNTER — Encounter: Payer: Self-pay | Admitting: Gynecology

## 2017-03-26 ENCOUNTER — Other Ambulatory Visit: Payer: Self-pay | Admitting: Cardiovascular Disease

## 2017-03-30 ENCOUNTER — Ambulatory Visit (INDEPENDENT_AMBULATORY_CARE_PROVIDER_SITE_OTHER): Payer: Medicare Other | Admitting: Pharmacist Clinician (PhC)/ Clinical Pharmacy Specialist

## 2017-03-30 DIAGNOSIS — I639 Cerebral infarction, unspecified: Secondary | ICD-10-CM

## 2017-03-30 DIAGNOSIS — I4891 Unspecified atrial fibrillation: Secondary | ICD-10-CM | POA: Diagnosis not present

## 2017-03-30 DIAGNOSIS — Z7901 Long term (current) use of anticoagulants: Secondary | ICD-10-CM

## 2017-03-30 LAB — POCT INR: INR: 2.2

## 2017-04-06 ENCOUNTER — Ambulatory Visit (INDEPENDENT_AMBULATORY_CARE_PROVIDER_SITE_OTHER): Payer: Medicare Other

## 2017-04-06 DIAGNOSIS — Z95810 Presence of automatic (implantable) cardiac defibrillator: Secondary | ICD-10-CM | POA: Diagnosis not present

## 2017-04-06 DIAGNOSIS — I5032 Chronic diastolic (congestive) heart failure: Secondary | ICD-10-CM | POA: Diagnosis not present

## 2017-04-06 NOTE — Progress Notes (Signed)
EPIC Encounter for ICM Monitoring  Patient Name: Danielle Mckinney is a 66 y.o. female Date: 04/06/2017 Primary Care Physican: Maurice Small, MD Primary Cardiologist: Croitoru Electrophysiologist: Lovena Le Dry Weight:180lbs  Bi-V Pacing: 98%         Heart Failure questions reviewed, pt symptomatic with weight.  Baseline weight ranges 175-177 lbs   Thoracic impedance just below baseline suggesting fluid accumulation.  Prescribed dosage: Furosemide 40 mg 1 tablet as needed and Potassium 20 mEq 1 tablet to be taken when taking Furosemide.  Labs: 07/12/2016 Creatinine 0.84, BUN 21, Potassium 4.1, Sodium 137  Recommendations:  Patient said she has gained a couple of pounds but thinks it is due to the foods she has eaten this week for her birthday celebration.  She planned on taking PRN Furosemide with Potassium for next 2 days as prescribed.  Encouraged her to call if weight does not decrease or she has other fluid symptoms.   Follow-up plan: ICM clinic phone appointment on 05/21/2017. Defib office appointment scheduled on 04/20/2017 with Dr Lovena Le.  Copy of ICM check sent to primary cardiologist and device physician.   3 month ICM trend: 04/06/2017   1 Year ICM trend:      [name], RN 04/06/2017 8:45 AM

## 2017-04-20 ENCOUNTER — Ambulatory Visit (INDEPENDENT_AMBULATORY_CARE_PROVIDER_SITE_OTHER): Payer: Medicare Other | Admitting: Internal Medicine

## 2017-04-20 ENCOUNTER — Encounter: Payer: Self-pay | Admitting: Internal Medicine

## 2017-04-20 DIAGNOSIS — I428 Other cardiomyopathies: Secondary | ICD-10-CM | POA: Diagnosis not present

## 2017-04-20 NOTE — Progress Notes (Signed)
HPI Danielle Mckinney returns today for followup. She is a pleasant 66 yo woman with an non-ischemic CM, S/P ICD implant, chronic systolic CHF, HTN, and chronic coumadin therapy. In the interim she has done well. She is frustrated by her inability to lose weight but has otherwise been stable. No edema or chest pain. Allergies  Allergen Reactions  . Codeine Nausea And Vomiting  . Lisinopril Cough  . Oxycodone Nausea And Vomiting  . Penicillins Nausea And Vomiting  . Sulfa Antibiotics Nausea And Vomiting     Current Outpatient Prescriptions  Medication Sig Dispense Refill  . Calcium-Magnesium-Vitamin D (CALCIUM 1200+D3 PO) Take by mouth.    . carvedilol (COREG) 6.25 MG tablet take 1 and 1/2 tablets by mouth twice a day with meals 90 tablet 11  . Cholecalciferol (D3-1000) 1000 UNITS tablet Take 1,000 Units by mouth daily.    . fish oil-omega-3 fatty acids 1000 MG capsule Take 1,000 mg by mouth daily.     . furosemide (LASIX) 40 MG tablet Take 1 tablet (40 mg total) by mouth daily as needed. For swelling 90 tablet 3  . losartan (COZAAR) 100 MG tablet take 1/2 tablet by mouth twice a day 30 tablet 6  . losartan (COZAAR) 100 MG tablet Take 0.5 tablets (50 mg total) by mouth 2 (two) times daily. 90 tablet 2  . meclizine (ANTIVERT) 25 MG tablet take 1/2-1 tablet by mouth twice a day if needed  0  . potassium chloride SA (K-DUR,KLOR-CON) 20 MEQ tablet take 1 tablet by mouth once daily if needed *TAKE ALONG WITH FUROSEMIDE WHEN NEEDED* 30 tablet 3  . spironolactone (ALDACTONE) 25 MG tablet take 1 tablet by mouth once daily 30 tablet 9  . warfarin (COUMADIN) 4 MG tablet Take 1 to 1 and 1/2 tablet daily as directed by coumadin clinic 90 tablet 1   No current facility-administered medications for this visit.      Past Medical History:  Diagnosis Date  . Atrial fibrillation ([place])   . Biventricular ICD (implantable cardioverter-defibrillator) in place    [facility]  . Breast cancer ([place])   . Cancer (HCC)     Breast cancer-BRACA I gene  . CHF (congestive heart failure) ([place])   . CIN I (cervical intraepithelial neoplasia I)   . Fibroid   . HTN (hypertension)   . Nonischemic cardiomyopathy ([place])    related to anthracycline chemotherapy  . Stroke ([place])     ROS:   All systems reviewed and negative except as noted in the HPI.   Past Surgical History:  Procedure Laterality Date  . BIV ICD GENERTAOR CHANGE OUT  12/20/2011   [place]  . BIV ICD GENERTAOR CHANGE OUT N/A 12/20/2011   Procedure: BIV ICD GENERTAOR CHANGE OUT;  Surgeon: Evans Lance, MD;  Location: [facility] CATH LAB;  Service: Cardiovascular;  Laterality: N/A;  . BREAST SURGERY     Bilateral mastctomy and reconstruction TRAM  . CARDIAC DEFIBRILLATOR PLACEMENT    . CERVICAL CONE BIOPSY    . COLPOSCOPY    . GYNECOLOGIC CRYOSURGERY    . MASTECTOMY    . OOPHORECTOMY     BSO  . TRANSTHORACIC ECHOCARDIOGRAM  08/14/06  . TUBAL LIGATION    . US ECHOCARDIOGRAPHY  06/25/2012   borderline concentric LVH,LV systolic fx mildly reduced,impaired LV relaxation     Family History  Problem Relation Age of Onset  . Breast cancer Sister        Age 66  . Hypertension Sister   .  Heart disease Maternal Grandfather   . Clotting disorder Mother   . Cirrhosis Father   . Coronary artery disease Other   . Hypertension Brother      Social History   Social History  . Marital status: Divorced    Spouse name: N/A  . Number of children: N/A  . Years of education: N/A   Occupational History  . Not on file.   Social History Main Topics  . Smoking status: Never Smoker  . Smokeless tobacco: Never Used  . Alcohol use No  . Drug use: No  . Sexual activity: Yes    Birth control/ protection: Surgical     Comment: interecourse age 48, sexual partners more than 5   Other Topics Concern  . Not on file   Social History Narrative  . No narrative on file     BP 140/80   Pulse 73   Temp (!) 95 F (35 C)   Ht 5' 2.5" (1.588 m)   Wt 181 lb  12.8 oz (82.5 kg)   SpO2 95%   BMI 32.72 kg/m   Physical Exam:  Well appearing middle-aged woman, NAD HEENT: Unremarkable Neck:  6 cm JVD, no thyromegally Back:  No CVA tenderness Lungs:  Clear with no wheezes, rales, or rhonchi. HEART:  Regular rate rhythm, no murmurs, no rubs, no clicks Abd:  soft, positive bowel sounds, no organomegally, no rebound, no guarding Ext:  2 plus pulses, no edema, no cyanosis, no clubbing Skin:  No rashes no nodules Neuro:  CN II through XII intact, motor grossly intact  EKG normal sinus rhythm with bi-ventricular pacing  DEVICE  Normal device function except for the ventricular noise as noted.  See PaceArt for details. Her tachy therapies have been turned off in the setting of normalization of her LV function.  Assess/Plan: 1. Chronic systolic heart failure - her symptoms are class2. She has had normalization of her LV function 2. HTN - Her blood pressure is a bit elevated today. She is encouraged to reduce her salt intake. 3. BiV ICD - her tachy therapies are turned off. Her device is working normally.  4. Obesity - I have encouraged the patient to lose 10-15 lbs over the next year.  [name]

## 2017-04-20 NOTE — Patient Instructions (Signed)
Medication Instructions:  Your physician recommends that you continue on your current medications as directed. Please refer to the Current Medication list given to you today.   Labwork: None Ordered  Testing/Procedures: None Ordered  Follow-Up: Your physician wants you to follow-up in: 1 Year with Dr. Lovena Le. You will receive a reminder letter in the mail two months in advance. If you don't receive a letter, please call our office to schedule the follow-up appointment.  Remote monitoring is used to monitor your Pacemaker from home. This monitoring reduces the number of office visits required to check your device to one time per year. It allows [place] to keep an eye on the functioning of your device to ensure it is working properly. You are scheduled for a device check from home on 07/23/17. You may send your transmission at any time that day. If you have a wireless device, the transmission will be sent automatically. After your physician reviews your transmission, you will receive a postcard with your next transmission date.    Any Other Special Instructions Will Be Listed Below (If Applicable).     If you need a refill on your cardiac medications before your next appointment, please call your pharmacy.

## 2017-04-24 LAB — CUP PACEART INCLINIC DEVICE CHECK
Brady Statistic RV Percent Paced: 98 %
HighPow Impedance: 52.0134
Implantable Lead Implant Date: 20080307
Implantable Lead Location: 753859
Implantable Lead Model: 7001
Implantable Pulse Generator Implant Date: 20130220
Lead Channel Impedance Value: 325 Ohm
Lead Channel Impedance Value: 650 Ohm
Lead Channel Pacing Threshold Amplitude: 0.75 V
Lead Channel Pacing Threshold Pulse Width: 0.5 ms
Lead Channel Setting Pacing Amplitude: 1.75 V
Lead Channel Setting Pacing Pulse Width: 0.8 ms
MDC IDC LEAD IMPLANT DT: 20080307
MDC IDC LEAD IMPLANT DT: 20080307
MDC IDC LEAD LOCATION: 753858
MDC IDC LEAD LOCATION: 753860
MDC IDC MSMT BATTERY REMAINING LONGEVITY: 28 mo
MDC IDC MSMT LEADCHNL LV PACING THRESHOLD PULSEWIDTH: 0.8 ms
MDC IDC MSMT LEADCHNL RA PACING THRESHOLD AMPLITUDE: 0.5 V
MDC IDC MSMT LEADCHNL RA PACING THRESHOLD PULSEWIDTH: 0.5 ms
MDC IDC MSMT LEADCHNL RA SENSING INTR AMPL: 5 mV
MDC IDC MSMT LEADCHNL RV IMPEDANCE VALUE: 425 Ohm
MDC IDC MSMT LEADCHNL RV PACING THRESHOLD AMPLITUDE: 0.75 V
MDC IDC MSMT LEADCHNL RV SENSING INTR AMPL: 11.3 mV
MDC IDC PG SERIAL: 7027976
MDC IDC SESS DTM: 20180622191935
MDC IDC SET LEADCHNL RA PACING AMPLITUDE: 1.5 V
MDC IDC SET LEADCHNL RV PACING AMPLITUDE: 2 V
MDC IDC SET LEADCHNL RV PACING PULSEWIDTH: 0.5 ms
MDC IDC SET LEADCHNL RV SENSING SENSITIVITY: 0.5 mV
MDC IDC STAT BRADY RA PERCENT PACED: 4.4 %

## 2017-04-30 NOTE — Addendum Note (Signed)
Addended by: Campbell Riches on: 04/30/2017 07:31 AM   Modules accepted: Orders

## 2017-05-21 ENCOUNTER — Ambulatory Visit (INDEPENDENT_AMBULATORY_CARE_PROVIDER_SITE_OTHER): Payer: Medicare Other | Admitting: Pharmacist

## 2017-05-21 ENCOUNTER — Ambulatory Visit (INDEPENDENT_AMBULATORY_CARE_PROVIDER_SITE_OTHER): Payer: Medicare Other | Admitting: *Deleted

## 2017-05-21 ENCOUNTER — Telehealth: Payer: Self-pay

## 2017-05-21 DIAGNOSIS — Z7901 Long term (current) use of anticoagulants: Secondary | ICD-10-CM

## 2017-05-21 DIAGNOSIS — I428 Other cardiomyopathies: Secondary | ICD-10-CM

## 2017-05-21 DIAGNOSIS — I639 Cerebral infarction, unspecified: Secondary | ICD-10-CM | POA: Diagnosis not present

## 2017-05-21 DIAGNOSIS — Z95810 Presence of automatic (implantable) cardiac defibrillator: Secondary | ICD-10-CM | POA: Diagnosis not present

## 2017-05-21 DIAGNOSIS — I5032 Chronic diastolic (congestive) heart failure: Secondary | ICD-10-CM

## 2017-05-21 DIAGNOSIS — I4891 Unspecified atrial fibrillation: Secondary | ICD-10-CM | POA: Diagnosis not present

## 2017-05-21 LAB — POCT INR: INR: 1.8

## 2017-05-21 NOTE — Telephone Encounter (Signed)
Remote ICM transmission received.  Attempted patient call and left message to return call regarding transmission.    

## 2017-05-21 NOTE — Progress Notes (Signed)
EPIC Encounter for ICM Monitoring  Patient Name: Danielle Mckinney is a 66 y.o. female Date: 05/21/2017 Primary Care Physican: Maurice Small, MD Primary Cardiologist: Croitoru Electrophysiologist: Lovena Le Dry Weight:Last ICM weight 180lbs  Bi-V Pacing: >99%          Attempted call to patient and unable to reach.  Left message to return call regarding transmission.  Transmission reviewed.    Thoracic impedance abnormal suggesting fluid accumulation starting 05/20/2017.  Also decreased impedance from 04/12/2017 to 04/17/2017, 04/27/2017 to 04/30/2017, 05/06/2017 to 05/09/2017 and 05/11/2017 to 05/14/2017.  Prescribed dosage: Furosemide 40 mg 1 tablet as needed and Potassium 20 mEq 1 tablet to be taken when taking Furosemide.  Labs: 07/12/2016 Creatinine 0.84, BUN 21, Potassium 4.1, Sodium 137  Recommendations: NONE - Unable to reach patient   Follow-up plan: ICM clinic phone appointment on 05/29/2017 to recheck fluid levels.   Copy of ICM check sent to primary cardiologist and device physician.   3 month ICM trend: 05/21/2017   1 Year ICM trend:      [name], RN 05/21/2017 9:27 AM

## 2017-05-22 NOTE — Progress Notes (Signed)
Patient returned call. Reviewed transmission and discussed salt intake and she thought she had been following a low salt diet.  She has been using canned vegetables, no rinsing,using salt shaker at times and eating at restaurants.  She has been taking Furosemide about 3 days at a time and usually only about 3 days between dosages for swelling in her hands and weight gain of a few pounds.  Advised she is due to make an appointment with Dr Sallyanne Kuster for yearly visit couple of months.  Advised her to inform him how often she is taking the fluid pill since she seems to be taking 3 days on and 3 days which correlates with the thoracic impedance pattern.  Encouraged her to start reviewing food labels to check her salt intake and advised it is 250 mg every time she shakes the salt shaker.  Will recheck fluid levels next week.

## 2017-05-24 ENCOUNTER — Encounter: Payer: Self-pay | Admitting: Cardiology

## 2017-05-29 ENCOUNTER — Ambulatory Visit (INDEPENDENT_AMBULATORY_CARE_PROVIDER_SITE_OTHER): Payer: Self-pay

## 2017-05-29 DIAGNOSIS — Z95810 Presence of automatic (implantable) cardiac defibrillator: Secondary | ICD-10-CM

## 2017-05-29 DIAGNOSIS — I5032 Chronic diastolic (congestive) heart failure: Secondary | ICD-10-CM

## 2017-05-29 NOTE — Progress Notes (Signed)
EPIC Encounter for ICM Monitoring  Patient Name: Danielle Mckinney is a 66 y.o. female Date: 05/29/2017 Primary Care Physican: Maurice Small, MD Primary Cardiologist: Croitoru Electrophysiologist: Lovena Le Dry Weight:176lbs  Bi-V Pacing: >99%      Heart Failure questions reviewed, pt asymptomatic.   Thoracic impedance returned to normal after taking 4 days of Furosemide.  Prescribed dosage: Furosemide 40 mg 1 tablet as needed and Potassium 20 mEq 1 tablet to be taken when taking Furosemide.  Labs: 07/12/2016 Creatinine 0.84, BUN 21, Potassium 4.1, Sodium 137  Recommendations: No changes.  Encouraged to call for fluid symptoms.  Follow-up plan: ICM clinic phone appointment on 06/21/2017.   Copy of ICM check sent to primary cardiologist and device physician.   3 month ICM trend: 05/29/2017   1 Year ICM trend:      [name], RN 05/29/2017 4:30 PM

## 2017-06-07 ENCOUNTER — Encounter: Payer: Medicare Other | Admitting: Obstetrics & Gynecology

## 2017-06-11 ENCOUNTER — Encounter: Payer: Self-pay | Admitting: Cardiovascular Disease

## 2017-06-11 ENCOUNTER — Telehealth: Payer: Self-pay

## 2017-06-11 NOTE — Telephone Encounter (Signed)
epicd 

## 2017-06-11 NOTE — Telephone Encounter (Addendum)
1. Type of surgery: right index finger mass excision 2. Date of surgery: 07/25/17 3. Surgeon: Dr Iran Planas 4. Medications that need to be held & how long: N/A; pt takes warfarin 4 mg directed by coumadin clinic 5. Fax and/or Phone: (p) [phone] (f) [phone]

## 2017-06-11 NOTE — Telephone Encounter (Signed)
Faxed to Margarita Grizzle Faust's attention via EPIC.

## 2017-06-15 ENCOUNTER — Other Ambulatory Visit: Payer: Self-pay

## 2017-06-15 MED ORDER — FUROSEMIDE 40 MG PO TABS: 40.0000 mg | ORAL_TABLET | Freq: Every day | ORAL | 0 refills | Status: DC | PRN

## 2017-06-15 NOTE — Telephone Encounter (Signed)
Received voice mail message from patient stating she is out of Furosemide and does not have any refills left.  Message forwarded [facility] Refill team

## 2017-06-19 LAB — CUP PACEART REMOTE DEVICE CHECK
Battery Remaining Longevity: 28 mo
Brady Statistic AP VS Percent: 1 %
Brady Statistic AS VS Percent: 1 %
HighPow Impedance: 47 Ohm
Implantable Lead Implant Date: 20080307
Implantable Lead Implant Date: 20080307
Implantable Lead Location: 753859
Implantable Lead Model: 7001
Implantable Pulse Generator Implant Date: 20130220
Lead Channel Pacing Threshold Amplitude: 0.5 V
Lead Channel Pacing Threshold Pulse Width: 0.5 ms
Lead Channel Sensing Intrinsic Amplitude: 5 mV
Lead Channel Setting Pacing Amplitude: 1.5 V
Lead Channel Setting Pacing Amplitude: 1.75 V
Lead Channel Setting Pacing Amplitude: 2 V
Lead Channel Setting Pacing Pulse Width: 0.8 ms
MDC IDC LEAD IMPLANT DT: 20080307
MDC IDC LEAD LOCATION: 753858
MDC IDC LEAD LOCATION: 753860
MDC IDC MSMT BATTERY REMAINING PERCENTAGE: 32 %
MDC IDC MSMT BATTERY VOLTAGE: 2.86 V
MDC IDC MSMT LEADCHNL LV IMPEDANCE VALUE: 700 Ohm
MDC IDC MSMT LEADCHNL LV PACING THRESHOLD AMPLITUDE: 0.75 V
MDC IDC MSMT LEADCHNL LV PACING THRESHOLD PULSEWIDTH: 0.8 ms
MDC IDC MSMT LEADCHNL RA IMPEDANCE VALUE: 310 Ohm
MDC IDC MSMT LEADCHNL RA PACING THRESHOLD PULSEWIDTH: 0.5 ms
MDC IDC MSMT LEADCHNL RV IMPEDANCE VALUE: 400 Ohm
MDC IDC MSMT LEADCHNL RV PACING THRESHOLD AMPLITUDE: 0.625 V
MDC IDC MSMT LEADCHNL RV SENSING INTR AMPL: 11.3 mV
MDC IDC SESS DTM: 20180723200841
MDC IDC SET LEADCHNL RV PACING PULSEWIDTH: 0.5 ms
MDC IDC SET LEADCHNL RV SENSING SENSITIVITY: 0.5 mV
MDC IDC STAT BRADY AP VP PERCENT: 5.9 %
MDC IDC STAT BRADY AS VP PERCENT: 94 %
MDC IDC STAT BRADY RA PERCENT PACED: 5.7 %
Pulse Gen Serial Number: 7027976

## 2017-06-21 ENCOUNTER — Ambulatory Visit (INDEPENDENT_AMBULATORY_CARE_PROVIDER_SITE_OTHER): Payer: Medicare Other

## 2017-06-21 DIAGNOSIS — I5032 Chronic diastolic (congestive) heart failure: Secondary | ICD-10-CM | POA: Diagnosis not present

## 2017-06-21 DIAGNOSIS — Z95810 Presence of automatic (implantable) cardiac defibrillator: Secondary | ICD-10-CM | POA: Diagnosis not present

## 2017-06-21 NOTE — Progress Notes (Signed)
EPIC Encounter for ICM Monitoring  Patient Name: Danielle Mckinney is a 66 y.o. female Date: 06/21/2017 Primary Care Physican: Maurice Small, MD Primary Cardiologist: Croitoru Electrophysiologist: Lovena Le Dry Weight:176lbs  Bi-V Pacing: >99%          Heart Failure questions reviewed, pt asymptomatic.   Thoracic impedance normal.  Prescribed dosage: Furosemide 40 mg 1 tablet as needed and Potassium 20 mEq 1 tablet to be taken when taking Furosemide.  Labs: 07/12/2016 Creatinine 0.84, BUN 21, Potassium 4.1, Sodium 137  Recommendations: No changes.    Encouraged to call for fluid symptoms.  Follow-up plan: ICM clinic phone appointment on 07/23/2017.   Copy of ICM check sent to Dr. Lovena Le.   3 month ICM trend: 06/21/2017   1 Year ICM trend:      [name], RN 06/21/2017 9:52 AM

## 2017-06-28 ENCOUNTER — Ambulatory Visit (INDEPENDENT_AMBULATORY_CARE_PROVIDER_SITE_OTHER): Payer: Medicare Other | Admitting: Pharmacist Clinician (PhC)/ Clinical Pharmacy Specialist

## 2017-06-28 DIAGNOSIS — Z7901 Long term (current) use of anticoagulants: Secondary | ICD-10-CM

## 2017-06-28 DIAGNOSIS — I639 Cerebral infarction, unspecified: Secondary | ICD-10-CM

## 2017-06-28 DIAGNOSIS — I4891 Unspecified atrial fibrillation: Secondary | ICD-10-CM | POA: Diagnosis not present

## 2017-06-28 LAB — POCT INR: INR: 1.5

## 2017-07-09 ENCOUNTER — Ambulatory Visit (INDEPENDENT_AMBULATORY_CARE_PROVIDER_SITE_OTHER): Payer: Medicare Other | Admitting: Obstetrics & Gynecology

## 2017-07-09 ENCOUNTER — Encounter: Payer: Self-pay | Admitting: Obstetrics & Gynecology

## 2017-07-09 VITALS — BP 128/78 | Ht 62.0 in | Wt 176.0 lb

## 2017-07-09 DIAGNOSIS — E8940 Asymptomatic postprocedural ovarian failure: Secondary | ICD-10-CM

## 2017-07-09 DIAGNOSIS — Z9889 Other specified postprocedural states: Secondary | ICD-10-CM

## 2017-07-09 DIAGNOSIS — Z01419 Encounter for gynecological examination (general) (routine) without abnormal findings: Secondary | ICD-10-CM

## 2017-07-09 DIAGNOSIS — Z124 Encounter for screening for malignant neoplasm of cervix: Secondary | ICD-10-CM | POA: Diagnosis not present

## 2017-07-09 DIAGNOSIS — C50911 Malignant neoplasm of unspecified site of right female breast: Secondary | ICD-10-CM | POA: Diagnosis not present

## 2017-07-09 DIAGNOSIS — Z1509 Genetic susceptibility to other malignant neoplasm: Secondary | ICD-10-CM | POA: Diagnosis not present

## 2017-07-09 DIAGNOSIS — Z1501 Genetic susceptibility to malignant neoplasm of breast: Secondary | ICD-10-CM | POA: Diagnosis not present

## 2017-07-09 DIAGNOSIS — Z113 Encounter for screening for infections with a predominantly sexual mode of transmission: Secondary | ICD-10-CM

## 2017-07-09 NOTE — Progress Notes (Signed)
[place] [birth year] [id number]   History:    66 y.o. G3P2A1L2  Divorced.  Has a boyfriend  RP:  Established patient presenting for annual gyn exam   HPI:  Menopause.  No HRT.  No PMB.  H/O LEEP.  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  S/P Bilateral Mastectomy and Bilateral Oophorectomy.  No pelvic pain.  No PMB. H/O Heart Failure/Pace Maker.  H/O Stroke.  Exercising 5 times a week.  Mictions/BMs wnl.   Past medical history,surgical history, family history and social history were all reviewed and documented in the EPIC chart.  Gynecologic History No LMP recorded. Patient is postmenopausal. Contraception: post menopausal status(Surgical) Last Pap: 2017. Results were: normal Last mammogram: Bilateral Mastectomy.  Breast Ca (Bilateral) Stage 4 followed by Dr Jana Hakim.  Obstetric History OB History  Gravida Para Term Preterm AB Living  _0 SAB TAB Ectopic Multiple Live Births               # Outcome Date GA Lbr Len/2nd Weight Sex Delivery Anes PTL Lv  3 AB           2 Term           1 Term                ROS: A ROS was performed and pertinent positives and negatives are included in the history.  GENERAL: No fevers or chills. HEENT: No change in vision, no earache, sore throat or sinus congestion. NECK: No pain or stiffness. CARDIOVASCULAR: No chest pain or pressure. No palpitations. PULMONARY: No shortness of breath, cough or wheeze. GASTROINTESTINAL: No abdominal pain, nausea, vomiting or diarrhea, melena or bright red blood per rectum. GENITOURINARY: No urinary frequency, urgency, hesitancy or dysuria. MUSCULOSKELETAL: No joint or muscle pain, no back pain, no recent trauma. DERMATOLOGIC: No rash, no itching, no lesions. ENDOCRINE: No polyuria, polydipsia, no heat or cold intolerance. No recent change in weight. HEMATOLOGICAL: No anemia or easy bruising or bleeding. NEUROLOGIC: No headache, seizures, numbness, tingling or weakness. PSYCHIATRIC: No  depression, no loss of interest in normal activity or change in sleep pattern.     Exam:   BP 128/78   Ht _1  (1.575 m)   Wt 176 lb (79.8 kg)   BMI 32.19 kg/m   Body mass index is 32.19 kg/m.  General appearance : Well developed well nourished female. No acute distress HEENT: Eyes: no retinal hemorrhage or exudates,  Neck supple, trachea midline, no carotid bruits, no thyroidmegaly Lungs: Clear to auscultation, no rhonchi or wheezes, or rib retractions  Heart: Regular rate and rhythm, no murmurs or gallops Breast:Examined in sitting and supine position were symmetrical in appearance, s/p Bilateral Mastectomy/Breast reconstruction, no palpable masses or tenderness,  no skin retraction, no nipple inversion, no nipple discharge, no skin discoloration, no axillary or supraclavicular lymphadenopathy Abdomen: no palpable masses or tenderness, no rebound or guarding Extremities: no edema or skin discoloration or tenderness  Pelvic: Vulva normal  Bartholin, Urethra, Skene Glands: Within normal limits             Vagina: No gross lesions or discharge  Cervix: No gross lesions or discharge.  Pap reflex done.  Gono-Chlam on Pap.  Uterus  AV, normal size, shape and consistency, non-tender and mobile  Adnexa  Without masses or tenderness  Anus and perineum  normal   Assessment/Plan:  66 y.o. female for annual exam  1. Encounter for routine gynecological examination with Papanicolaou smear of cervix Gyn exam normal.  Pap reflex done/Gono-Chlam.  Breasts s/p bilateral Mastectomy/Reconstruction.  2. Surgical menopause No HRT.  No PMB.  Vit D supplement.  Ca++ in nutrition.  3. Screen for STD (sexually transmitted disease) Condoms recommended. - HIV antibody - RPR - Hepatitis B Surface AntiGEN - Hepatitis C Antibody -Gono-Chlam on pap  4. H/O LEEP Pap done today.  5. Malignant neoplasm of right female breast, unspecified estrogen receptor status, unspecified site of breast  [facility]) F/U Dr Jana Hakim.  6. BRCA1 positive S/P Bilateral Mastectomy and Bilateral Oophorectomy.  Princess Bruins MD, 3:31 PM 07/09/2017

## 2017-07-10 NOTE — Patient Instructions (Addendum)
1. Encounter for routine gynecological examination with Papanicolaou smear of cervix Gyn exam normal.  Pap reflex done/Gono-Chlam.  Breasts s/p bilateral Mastectomy/Reconstruction.  2. Surgical menopause No HRT.  No PMB.  Vit D supplement.  Ca++ in nutrition.  3. Screen for STD (sexually transmitted disease) Condoms recommended. - HIV antibody - RPR - Hepatitis B Surface AntiGEN - Hepatitis C Antibody -Gono-Chlam on pap  4. H/O LEEP Pap done today.  5. Malignant neoplasm of right female breast, unspecified estrogen receptor status, unspecified site of breast [facility]) F/U Dr Jana Hakim.  6. BRCA1 positive S/P Bilateral Mastectomy and Bilateral Oophorectomy.  Danielle Mckinney, it was a pleasure to meet you today!  I will inform you of your results as soon as available.

## 2017-07-11 ENCOUNTER — Other Ambulatory Visit: Payer: Self-pay | Admitting: Cardiovascular Disease

## 2017-07-12 ENCOUNTER — Other Ambulatory Visit (HOSPITAL_BASED_OUTPATIENT_CLINIC_OR_DEPARTMENT_OTHER): Payer: Medicare Other

## 2017-07-12 ENCOUNTER — Other Ambulatory Visit: Payer: Medicare Other

## 2017-07-12 DIAGNOSIS — C50912 Malignant neoplasm of unspecified site of left female breast: Secondary | ICD-10-CM

## 2017-07-12 DIAGNOSIS — C50911 Malignant neoplasm of unspecified site of right female breast: Secondary | ICD-10-CM

## 2017-07-12 DIAGNOSIS — Z1501 Genetic susceptibility to malignant neoplasm of breast: Principal | ICD-10-CM

## 2017-07-12 LAB — COMPREHENSIVE METABOLIC PANEL
ALK PHOS: 61 U/L (ref 40–150)
ALT: 8 U/L (ref 0–55)
AST: 19 U/L (ref 5–34)
Albumin: 3.9 g/dL (ref 3.5–5.0)
Anion Gap: 7 mEq/L (ref 3–11)
BUN: 12.7 mg/dL (ref 7.0–26.0)
CHLORIDE: 106 meq/L (ref 98–109)
CO2: 26 meq/L (ref 22–29)
Calcium: 9.9 mg/dL (ref 8.4–10.4)
Creatinine: 0.8 mg/dL (ref 0.6–1.1)
EGFR: 89 mL/min/{1.73_m2} — AB (ref >90)
GLUCOSE: 103 mg/dL (ref 70–140)
Potassium: 4.2 mEq/L (ref 3.5–5.1)
SODIUM: 139 meq/L (ref 136–145)
Total Bilirubin: 0.54 mg/dL (ref 0.20–1.20)
Total Protein: 7.3 g/dL (ref 6.4–8.3)

## 2017-07-12 LAB — CBC WITH DIFFERENTIAL/PLATELET
BASO%: 0.6 % (ref 0.0–2.0)
BASOS ABS: 0 10*3/uL (ref 0.0–0.1)
EOS ABS: 0.1 10*3/uL (ref 0.0–0.5)
EOS%: 2.6 % (ref 0.0–7.0)
HCT: 36.8 % (ref 34.8–46.6)
HGB: 12.2 g/dL (ref 11.6–15.9)
LYMPH%: 39.3 % (ref 14.0–49.7)
MCH: 26.4 pg (ref 25.1–34.0)
MCHC: 33.2 g/dL (ref 31.5–36.0)
MCV: 79.4 fL — AB (ref 79.5–101.0)
MONO#: 0.7 10*3/uL (ref 0.1–0.9)
MONO%: 11.7 % (ref 0.0–14.0)
NEUT#: 2.6 10*3/uL (ref 1.5–6.5)
NEUT%: 45.8 % (ref 38.4–76.8)
Platelets: 216 10*3/uL (ref 145–400)
RBC: 4.63 10*6/uL (ref 3.70–5.45)
RDW: 15.9 % — ABNORMAL HIGH (ref 11.2–14.5)
WBC: 5.7 10*3/uL (ref 3.9–10.3)
lymph#: 2.2 10*3/uL (ref 0.9–3.3)

## 2017-07-12 LAB — HEPATITIS C ANTIBODY
HEP C AB: REACTIVE — AB
SIGNAL TO CUT-OFF: 2.1 — AB (ref <1.00)

## 2017-07-12 LAB — HCV RNA,QUANTITATIVE REAL TIME PCR
HCV QUANT LOG: NOT DETECTED {Log_IU}/mL
HCV RNA, PCR, QN: <15 IU/mL

## 2017-07-12 LAB — PAP IG, CT-NG, RFX HPV ASCU
C. trachomatis RNA, TMA: NOT DETECTED
N. gonorrhoeae RNA, TMA: NOT DETECTED

## 2017-07-12 LAB — HIV ANTIBODY (ROUTINE TESTING W REFLEX): HIV: NONREACTIVE

## 2017-07-12 LAB — RPR: RPR: NONREACTIVE

## 2017-07-12 LAB — HEPATITIS B SURFACE ANTIGEN: HEP B S AG: NONREACTIVE

## 2017-07-13 LAB — CANCER ANTIGEN 27.29: CAN 27.29: 22.7 U/mL (ref 0.0–38.6)

## 2017-07-16 ENCOUNTER — Telehealth: Payer: Self-pay | Admitting: *Deleted

## 2017-07-16 NOTE — Telephone Encounter (Signed)
-----   Message from Ramond Craver, [place] sent at 07/12/2017  9:32 AM EDT ----- Regarding: referral  Hep C positive. Per Dr. Marguerita Merles- Please inform patient and refer to Hepatic specialist ([facility]) for further investigation and treatment.

## 2017-07-16 NOTE — Telephone Encounter (Signed)
Referral form/notes faxed to [facility] [phone] they will fax me back with time and date

## 2017-07-17 ENCOUNTER — Ambulatory Visit (INDEPENDENT_AMBULATORY_CARE_PROVIDER_SITE_OTHER): Payer: Medicare Other | Admitting: Pharmacist Clinician (PhC)/ Clinical Pharmacy Specialist

## 2017-07-17 DIAGNOSIS — Z7901 Long term (current) use of anticoagulants: Secondary | ICD-10-CM | POA: Diagnosis not present

## 2017-07-17 DIAGNOSIS — I4891 Unspecified atrial fibrillation: Secondary | ICD-10-CM | POA: Diagnosis not present

## 2017-07-17 DIAGNOSIS — I480 Paroxysmal atrial fibrillation: Secondary | ICD-10-CM | POA: Diagnosis not present

## 2017-07-17 DIAGNOSIS — I639 Cerebral infarction, unspecified: Secondary | ICD-10-CM

## 2017-07-17 LAB — POCT INR: INR: 2.2

## 2017-07-18 ENCOUNTER — Telehealth: Payer: Self-pay

## 2017-07-18 LAB — CBC WITH DIFFERENTIAL/PLATELET
BASOS: 0 %
Basophils Absolute: 0 10*3/uL (ref 0.0–0.2)
EOS (ABSOLUTE): 0.1 10*3/uL (ref 0.0–0.4)
Eos: 2 %
HEMATOCRIT: 37.2 % (ref 34.0–46.6)
HEMOGLOBIN: 12.5 g/dL (ref 11.1–15.9)
IMMATURE GRANS (ABS): 0 10*3/uL (ref 0.0–0.1)
Immature Granulocytes: 0 %
LYMPHS: 41 %
Lymphocytes Absolute: 2.4 10*3/uL (ref 0.7–3.1)
MCH: 26 pg — AB (ref 26.6–33.0)
MCHC: 33.6 g/dL (ref 31.5–35.7)
MCV: 78 fL — AB (ref 79–97)
MONOCYTES: 8 %
Monocytes Absolute: 0.5 10*3/uL (ref 0.1–0.9)
NEUTROS ABS: 2.9 10*3/uL (ref 1.4–7.0)
Neutrophils: 49 %
Platelets: 230 10*3/uL (ref 150–379)
RBC: 4.8 x10E6/uL (ref 3.77–5.28)
RDW: 16 % — ABNORMAL HIGH (ref 12.3–15.4)
WBC: 5.9 10*3/uL (ref 3.4–10.8)

## 2017-07-18 NOTE — Telephone Encounter (Signed)
Patient called inquiring about her appt to see liver specialist. I called her back and left detailed message per DPR access note on file. We spoke on 9/13. Office was closed due to weather on 9/14, Friday. [facility] faxed referral and records on 07/16/17 midday. Hopefully, we will be hearing something soon about her appointment. We will call her when we do.

## 2017-07-19 ENCOUNTER — Ambulatory Visit: Payer: Medicare Other | Admitting: Oncology

## 2017-07-19 ENCOUNTER — Ambulatory Visit (HOSPITAL_COMMUNITY)
Admission: RE | Admit: 2017-07-19 | Discharge: 2017-07-19 | Disposition: A | Discharge status: Home / Self Care | Payer: Medicare Other | Source: Ambulatory Visit | Attending: Oncology | Admitting: Oncology

## 2017-07-19 ENCOUNTER — Ambulatory Visit (HOSPITAL_BASED_OUTPATIENT_CLINIC_OR_DEPARTMENT_OTHER): Payer: Medicare Other | Admitting: Oncology

## 2017-07-19 ENCOUNTER — Telehealth: Payer: Self-pay

## 2017-07-19 VITALS — BP 124/66 | HR 67 | Temp 98.1°F | Resp 20 | Ht 62.0 in | Wt 177.2 lb

## 2017-07-19 DIAGNOSIS — Z853 Personal history of malignant neoplasm of breast: Secondary | ICD-10-CM

## 2017-07-19 DIAGNOSIS — Z1501 Genetic susceptibility to malignant neoplasm of breast: Secondary | ICD-10-CM | POA: Insufficient documentation

## 2017-07-19 DIAGNOSIS — I700 Atherosclerosis of aorta: Secondary | ICD-10-CM | POA: Insufficient documentation

## 2017-07-19 DIAGNOSIS — C50912 Malignant neoplasm of unspecified site of left female breast: Secondary | ICD-10-CM | POA: Diagnosis present

## 2017-07-19 DIAGNOSIS — C50921 Malignant neoplasm of unspecified site of right male breast: Secondary | ICD-10-CM

## 2017-07-19 DIAGNOSIS — I429 Cardiomyopathy, unspecified: Secondary | ICD-10-CM | POA: Diagnosis not present

## 2017-07-19 NOTE — Telephone Encounter (Signed)
Printed patient avs and calender for 1 year appointment date. Per 9/20 los

## 2017-07-19 NOTE — Progress Notes (Signed)
[place]  [phone] Fax:([phone]) [phone]     ID: Danielle Mckinney DOB: 10-04-1951  MR#: [id number]  QVZ#:[id number]  Patient Care Team: Maurice Small, MD as PCP - General (Family Medicine) Chauncey Cruel, MD as Consulting Physician (Oncology) Sanda Klein, MD as Consulting Physician (Cardiology) Terrance Mass, MD as Consulting Physician (Gynecology) PCP: Jonathon Bellows, MD SU:  OTHER MD:  CHIEF COMPLAINT:  BRCA positive  Breast cancer  CURRENT TREATMENT:  Observation  INTERVAL HISTORY: [name] returns today for follow-up of her BRCA related breast cancer. She reports that she was recently evaluated by her Gynecologist, Dr. Dellis Filbert and her PCP, Dr. Justin Mend. She notes that she is doing well overall. She reports that she had a recent Hepatitis C test at her gynecologist office that was initially positive, but was negative on confirmatory testing at the end of the day. Pt reports that her gynecologist is referring the patient to a specialist for further evaluation of this. Pt voices that she would like to have an xray completed to check things out.  Pt reports that she will have surgery on her right index finger due to a cyst and bone spur. She states that she was evaluated by Dr. Caralyn Guile for her right index finger and was informed that she will have a biopsy of the area. Pt denies prior PMHx of gout. Pt reports that she has her INR levels checked by Dr. Sallyanne Kuster. She reports intermittent blood in stools.  She states that she hasn't had any issues or complaints with her heart as of recently. She reports that her family is doing well and her daughter, whom has 3 children recently moved from [place] to [place]. She is elated that her grandchildren and daughter are closer to her.   REVIEW OF SYSTEMS: Araina reports that she exercises while at the gym 4-5 times a week.  She denies unusual headaches, visual changes, nausea, vomiting, or dizziness. There has been no unusual cough,  phlegm production, or pleurisy. This been no change in bladder habits. She denies unexplained fatigue or unexplained weight loss, bleeding, rash, or fever. A detailed review of systems was otherwise negative.    BREAST CANCER HISTORY: From the earlier summary note:   Danielle Mckinney is referred back by her primary care physician, Dr. Justin Mend, because of a rise in her breast cancer tumor marker. To recap : Harvest is status post bilateral breast cancers, not synchronous, and bilateral mastectomies with reconstruction. She had stage IV disease , and was treated aggressively with high-dose chemotherapy and I'll ago his stem cell transplant support at [facility] in 1998. She has remained disease-free since that time , but did have a significant cardiomyopathy, which now appears to have largely resolved. She also had a right body stroke , with minimal residuals.   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 One of her 3 sisters did die from breast cancer at an early age and one of the patient's 2 daughters has been found to carry the mutation.  Her breast cancer history is given in more detail below    PAST MEDICAL HISTORY:  Past Medical History:  Diagnosis Date  . Atrial fibrillation ([place])   . Biventricular ICD (implantable cardioverter-defibrillator) in place    [employer]  . Breast cancer ([place])   . Cancer (HCC)    Breast cancer-BRACA I gene  . CHF (congestive  heart failure) (HCC)   . CIN I (cervical intraepithelial neoplasia I)   . Fibroid   . HTN (hypertension)   . 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 Stroke [facility])     PAST SURGICAL HISTORY:  Past Surgical History:  Procedure Laterality Date  . BIV ICD GENERTAOR CHANGE OUT  12/20/2011   [place]  . BIV ICD GENERTAOR CHANGE OUT N/A 12/20/2011   Procedure: BIV ICD GENERTAOR CHANGE OUT;  Surgeon: Marinus Maw, MD;   Location: [facility] CATH LAB;  Service: Cardiovascular;  Laterality: N/A;  . BREAST SURGERY     Bilateral mastctomy and reconstruction TRAM  . CARDIAC DEFIBRILLATOR PLACEMENT    . CERVICAL CONE BIOPSY    . COLPOSCOPY    . GYNECOLOGIC CRYOSURGERY    . MASTECTOMY    . OOPHORECTOMY     BSO  . TRANSTHORACIC ECHOCARDIOGRAM  08/14/06  . TUBAL LIGATION    . US ECHOCARDIOGRAPHY  06/25/2012   borderline concentric LVH,LV systolic fx mildly reduced,impaired LV relaxation    FAMILY HISTORY Family History  Problem Relation Age of Onset  . Breast cancer Sister        Age 81  . Hypertension Sister   . Heart disease Maternal Grandfather   . Clotting disorder Mother   . Cirrhosis Father   . Coronary artery disease Other   . Hypertension Brother    The patient's father died in his late 23s from cirrhosis of the liver. The patient's mother died at the age of 55 secondary to a blood clot. Yanira had 3 brothers, 3 sisters. One sister died from breast cancer. But Ailany does not recall the exact age of diagnosis. One paternal aunt died with breast and ovarian cancer. The patient is known to be BRCA positive as is one of her 2 daughters  GYNECOLOGIC HISTORY:  No LMP recorded. Patient is postmenopausal.  menarche age 66, first live birth age 66 as she is GX P2. She had a total abdominal hysterectomy with bilateral salpingo-oophorectomy  SOCIAL HISTORY:   she is divorced and lives alone, with no pets. Her daughter Danielle Mckinney recently moved back to town (September 2018). and younger daughter Danielle Mckinney live in [place], where she works as an Charity fundraiser.. The patient has 3 grandchildren. One granddaughter is an Charity fundraiser in [place]    ADVANCED DIRECTIVES:  In place; her daughter Danielle Mckinney is her healthcare power of attorney   HEALTH MAINTENANCE: Social History  Substance Use Topics  . Smoking status: Never Smoker  . Smokeless tobacco: Never Used  . Alcohol use No     Colonoscopy: 2010/[facility]  PAP: July 2016  Bone density:  05/27/2015/normal  Lipid panel: Allergies  Allergen Reactions  . Codeine Nausea And Vomiting  . Lisinopril Cough  . Oxycodone Nausea And Vomiting  . Penicillins Nausea And Vomiting  . Sulfa Antibiotics Nausea And Vomiting    No outpatient prescriptions have been marked as taking for the 07/19/17 encounter (Office Visit) with Magrinat, Valentino Hue, MD.   Current Outpatient Prescriptions  Medication Sig Dispense Refill  . Calcium-Magnesium-Vitamin D (CALCIUM 1200+D3 PO) Take by mouth.    . carvedilol (COREG) 6.25 MG tablet take 1 and 1/2 tablets by mouth twice a day with meals 90 tablet 11  . Cholecalciferol (D3-1000) 1000 UNITS tablet Take 1,000 Units by mouth daily.    . fish oil-omega-3 fatty acids 1000 MG capsule Take 1,000 mg by mouth daily.     . furosemide (LASIX) 40 MG tablet  Take 1 tablet (40 mg total) by mouth daily as needed. For swelling 90 tablet 0  . losartan (COZAAR) 100 MG tablet take 1/2 tablet by mouth twice a day 30 tablet 6  . losartan (COZAAR) 100 MG tablet Take 0.5 tablets (50 mg total) by mouth 2 (two) times daily. 90 tablet 2  . meclizine (ANTIVERT) 25 MG tablet take 1/2-1 tablet by mouth twice a day if needed  0  . potassium chloride SA (K-DUR,KLOR-CON) 20 MEQ tablet take 1 tablet by mouth once daily if needed *TAKE ALONG WITH FUROSEMIDE WHEN NEEDED* 30 tablet 3  . spironolactone (ALDACTONE) 25 MG tablet take 1 tablet by mouth once daily 30 tablet 9  . warfarin (COUMADIN) 4 MG tablet take 1 to 1 and 1/2 tablets by mouth as directed BY COUMADIN CLINIC 90 tablet 1   No current facility-administered medications for this visit.     Vitals:   07/19/17 1339  BP: 124/66  Pulse: 67  Resp: 20  Temp: 98.1 F (36.7 C)  TempSrc: Oral  SpO2: 99%  Weight: 177 lb 3.2 oz (80.4 kg)  Height: '5\' 2"'$  (1.575 m)    Body mass index is 32.41 kg/m.  Wt Readings from Last 3 Encounters:  07/19/17 177 lb 3.2 oz (80.4 kg)  07/09/17 176 lb (79.8 kg)  04/20/17 181 lb 12.8 oz (82.5  kg)    Sclerae unicteric, pupils round and equal Oropharynx clear and moist No cervical or supraclavicular adenopathy Lungs no rales or rhonchi Heart regular rate and rhythm; pacer in left upper anterior chest Abd soft, nontender, positive bowel sounds MSK no focal spinal tenderness, no upper extremity lymphedema Neuro: nonfocal, well oriented, appropriate affect Breasts: She has undergone bilateral mastectomies. She has had bilateral reconstruction and bilateral radiation. There is no evidence of local recurrence. Both axillae are benign.  LAB RESULTS:  CMP     Component Value Date/Time   NA 139 07/12/2017 1445   K 4.2 07/12/2017 1445   CL 99 07/12/2016 1619   CO2 26 07/12/2017 1445   GLUCOSE 103 07/12/2017 1445   BUN 12.7 07/12/2017 1445   CREATININE 0.8 07/12/2017 1445   CALCIUM 9.9 07/12/2017 1445   PROT 7.3 07/12/2017 1445   ALBUMIN 3.9 07/12/2017 1445   AST 19 07/12/2017 1445   ALT 8 07/12/2017 1445   ALKPHOS 61 07/12/2017 1445   BILITOT 0.54 07/12/2017 1445   GFRNONAA >60 04/14/2015 1154   GFRAA >60 04/14/2015 1154    CBC    Component Value Date/Time   WBC 5.9 07/17/2017 1549   WBC 5.7 07/12/2017 1445   WBC 5.0 04/14/2015 1154   RBC 4.80 07/17/2017 1549   RBC 4.63 07/12/2017 1445   RBC 4.75 04/14/2015 1154   HGB 12.5 07/17/2017 1549   HGB 12.2 07/12/2017 1445   HCT 37.2 07/17/2017 1549   HCT 36.8 07/12/2017 1445   PLT 230 07/17/2017 1549   MCV 78 (L) 07/17/2017 1549   MCV 79.4 (L) 07/12/2017 1445   MCH 26.0 (L) 07/17/2017 1549   MCH 26.4 07/12/2017 1445   MCH 26.1 04/14/2015 1154   MCHC 33.6 07/17/2017 1549   MCHC 33.2 07/12/2017 1445   MCHC 34.2 04/14/2015 1154   RDW 16.0 (H) 07/17/2017 1549   RDW 15.9 (H) 07/12/2017 1445   LYMPHSABS 2.4 07/17/2017 1549   LYMPHSABS 2.2 07/12/2017 1445   MONOABS 0.7 07/12/2017 1445   EOSABS 0.1 07/17/2017 1549   BASOSABS 0.0 07/17/2017 1549   BASOSABS 0.0 07/12/2017 1445  Chemistry    CMP Latest Ref  Rng & Units 07/12/2017 07/12/2016 07/12/2016  Glucose 70 - 140 mg/dl 103 103(H) 122  BUN 7.0 - 26.0 mg/dL 12.7 21 21.8  Creatinine 0.6 - 1.1 mg/dL 0.8 0.84 1.1  Sodium 136 - 145 mEq/L 139 137 139  Potassium 3.5 - 5.1 mEq/L 4.2 4.1 4.1  Chloride 98 - 110 mmol/L - 99 -  CO2 22 - 29 mEq/L '26 25 28  '$ Calcium 8.4 - 10.4 mg/dL 9.9 9.7 9.7  Total Protein 6.4 - 8.3 g/dL 7.3 7.7 8.0  Total Bilirubin 0.20 - 1.20 mg/dL 0.54 0.7 0.78  Alkaline Phos 40 - 150 U/L 61 66 85  AST 5 - 34 U/L '19 17 13  '$ ALT 0 - 55 U/L 8 9 <9     Urinalysis    Component Value Date/Time   COLORURINE YELLOW 08/08/2008 1859   APPEARANCEUR CLEAR 08/08/2008 1859   LABSPEC 1.012 08/08/2008 1859   PHURINE 6.0 08/08/2008 1859   GLUCOSEU NEGATIVE 08/08/2008 1859   HGBUR NEGATIVE 08/08/2008 1859   BILIRUBINUR NEGATIVE 08/08/2008 1859   KETONESUR NEGATIVE 08/08/2008 1859   PROTEINUR NEGATIVE 08/08/2008 1859   UROBILINOGEN 0.2 08/08/2008 1859   NITRITE NEGATIVE 08/08/2008 1859   LEUKOCYTESUR  08/08/2008 1859    NEGATIVE MICROSCOPIC NOT DONE ON URINES WITH NEGATIVE PROTEIN, BLOOD, LEUKOCYTES, NITRITE, OR GLUCOSE <1000 mg/dL.     STUDIES: Outside labs reviewed and particularly reviewed her CA-27-29, which not only remains in the normal range but is actually lower than before. We also reviewed her hepatitis C studies. She was antibody positive but there was no detectable hepatitis C RNA  ASSESSMENT: 66 y.o. BRCA mutation positive thank you Delta woman  (1) s/p Right lumpectomy in 1990 for a Stage II breast cancer treated with doxorubicin, cyclophosphamide and 5- fluorouracil, followed by radiation.  (2) Left lumpectomy in April of 1998 for a "Stage IV" tumor (she had left supraclavicular involvement which currently would be staged as III-C),  Estrogen and progesterone receptor negative, HER2 unknown  (a) treated with doxorubicin and paclitaxel followed by high-dose chemotherapy at [facility] with stem cell rescue  in  December of 1998   (b) freceived radiation to the left supraclavicular, left axillary and left chest wall areas, completed March of 1999   (3) brca POSITIVE:  (a) urinary the patient's daughter [name] carries the mutation (on status post TAH-BSO 2000  (b)   (4) s/p bilateral mastectomies with flap reconstruction January 2000 with no evidence of residual disease.  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  (a)  on lifelong anticoagulation  (7) alpha thalassemia trait, with an MCV in the high 70s chronically   PLAN:  Danielle is 20 years out from her definitive surgical treatment of breast cancer, with no evidence of disease recurrence. This is of course wonderful.  She has had multiple problems related to the treatment she received for breast cancer including the cardiomyopathy issue, the associated stroke, and other problems, but she manages these absolutely beautifully, and leads as normal life and is positive alive as one could expect.  I reassured her that I do not believe she has hepatitis C but she does need to see a specialist to confirm that. We also reviewed her CA-27-29, which was favorable.  She was interested in getting "some films". I put her in for a chest x-ray today. I do not anticipate any significant abnormalities  She will see me again in one year. She knows to call for any problems that may develop before that visit.   Magrinat, Virgie Dad, MD  07/19/17 1:43 PM Medical Oncology and Hematology [facility] [address] [place], [place] [address] Tel. [phone]    Fax. [phone]  This document serves as a record of services personally performed by Lurline Del, MD. It was created on her behalf by Steva Colder, a trained  medical scribe. The creation of this record is based on the scribe's personal observations and the provider's statements to them. This document has been checked and approved by the attending provider.

## 2017-07-20 ENCOUNTER — Telehealth: Payer: Self-pay

## 2017-07-20 ENCOUNTER — Other Ambulatory Visit: Payer: Self-pay | Admitting: Cardiovascular Disease

## 2017-07-20 NOTE — Telephone Encounter (Signed)
Spoke with patient and let her know that chest xray results were normal.  She voiced understanding and had no questions at this time.

## 2017-07-23 ENCOUNTER — Ambulatory Visit (INDEPENDENT_AMBULATORY_CARE_PROVIDER_SITE_OTHER): Payer: Medicare Other

## 2017-07-23 DIAGNOSIS — Z95810 Presence of automatic (implantable) cardiac defibrillator: Secondary | ICD-10-CM | POA: Diagnosis not present

## 2017-07-23 DIAGNOSIS — I5032 Chronic diastolic (congestive) heart failure: Secondary | ICD-10-CM

## 2017-07-23 NOTE — Progress Notes (Signed)
EPIC Encounter for ICM Monitoring  Patient Name: Danielle Mckinney is a 66 y.o. female Date: 07/23/2017 Primary Care Physican: Maurice Small, MD Primary Cardiologist: Croitoru Electrophysiologist: Lovena Le Dry Weight:176lbs  Bi-V Pacing: >99%      Heart Failure questions reviewed, pt asymptomatic.   Thoracic impedance abnormal suggesting fluid accumulation since 07/20/2017.  Prescribed dosage: Furosemide 40 mg 1 tablet as needed and Potassium 20 mEq 1 tablet to be taken when taking Furosemide.  Labs: 07/12/2017 Creatinine 0.8,   BUN 12.7, Potassium 4.2, Sodium 139, EGFR 89 07/12/2016 Creatinine 0.84, BUN 21,    Potassium 4.1, Sodium 137  Recommendations: Patient planned on taking Furosemide for next 2 days because she had been out of town for the last several days and knew she had eaten foods higher in salt than usual.  Follow-up plan: ICM clinic phone appointment on 08/23/2017.  Office appointment scheduled 08/27/2017 with Dr. Sallyanne Kuster.  Copy of ICM check sent to Dr. Lovena Le and Dr. Sallyanne Kuster.   3 month ICM trend: 07/23/2017   1 Year ICM trend:      [name], RN 07/23/2017 9:11 AM

## 2017-07-24 NOTE — Telephone Encounter (Signed)
Patient scheduled on 08/01/17@ 1:30pm

## 2017-07-25 ENCOUNTER — Other Ambulatory Visit: Payer: Self-pay

## 2017-08-07 ENCOUNTER — Ambulatory Visit (INDEPENDENT_AMBULATORY_CARE_PROVIDER_SITE_OTHER): Payer: Medicare Other | Admitting: Pharmacist Clinician (PhC)/ Clinical Pharmacy Specialist

## 2017-08-07 DIAGNOSIS — I480 Paroxysmal atrial fibrillation: Secondary | ICD-10-CM | POA: Diagnosis not present

## 2017-08-07 DIAGNOSIS — Z7901 Long term (current) use of anticoagulants: Secondary | ICD-10-CM | POA: Diagnosis not present

## 2017-08-07 DIAGNOSIS — I639 Cerebral infarction, unspecified: Secondary | ICD-10-CM

## 2017-08-07 DIAGNOSIS — I4891 Unspecified atrial fibrillation: Secondary | ICD-10-CM | POA: Diagnosis not present

## 2017-08-07 LAB — POCT INR: INR: 2.2

## 2017-08-18 ENCOUNTER — Other Ambulatory Visit: Payer: Self-pay | Admitting: Cardiovascular Disease

## 2017-08-23 ENCOUNTER — Ambulatory Visit (INDEPENDENT_AMBULATORY_CARE_PROVIDER_SITE_OTHER): Payer: Medicare Other | Admitting: *Deleted

## 2017-08-23 DIAGNOSIS — I428 Other cardiomyopathies: Secondary | ICD-10-CM | POA: Diagnosis not present

## 2017-08-23 DIAGNOSIS — Z95810 Presence of automatic (implantable) cardiac defibrillator: Secondary | ICD-10-CM

## 2017-08-23 DIAGNOSIS — I5032 Chronic diastolic (congestive) heart failure: Secondary | ICD-10-CM

## 2017-08-23 NOTE — Progress Notes (Signed)
EPIC Encounter for ICM Monitoring  Patient Name: Danielle Mckinney is a 65 y.o. female Date: 08/23/2017 Primary Care Physican: Maurice Small, MD Primary Cardiologist: Croitoru Electrophysiologist: Lovena Le Dry Weight: Last weight 176lbs  Bi-V Pacing: >99%      Attempted call to patient and unable to reach.  Left detailed message regarding transmission.  Transmission reviewed.    Thoracic impedance normal but was abnormal suggesting fluid accumulation from 07/20/2017 to 07/30/2017 and 08/18/2017 to10/22/2018.  Prescribed dosage: Furosemide 40 mg 1 tablet as needed and Potassium 20 mEq 1 tablet to be taken when taking Furosemide.  Labs: 07/12/2017 Creatinine 0.8,   BUN 12.7, Potassium 4.2, Sodium 139, EGFR 89 07/12/2016 Creatinine 0.84, BUN 21,    Potassium 4.1, Sodium 137  Recommendations: Left voice mail with ICM number and encouraged to call if experiencing any fluid symptoms.  Follow-up plan: ICM clinic phone appointment on 09/27/2017.  Office appointment scheduled 08/27/2017 with Dr. Sallyanne Kuster.  Copy of ICM check sent to Dr. Lovena Le.   3 month ICM trend: 08/23/2017   1 Year ICM trend:      [name], RN 08/23/2017 5:08 PM

## 2017-08-23 NOTE — Progress Notes (Signed)
Remote ICD transmission.   

## 2017-08-24 ENCOUNTER — Encounter: Payer: Self-pay | Admitting: Cardiology

## 2017-08-24 LAB — CUP PACEART REMOTE DEVICE CHECK
Battery Remaining Percentage: 31 %
Battery Voltage: 2.86 V
Brady Statistic AP VP Percent: 11 %
Brady Statistic AS VP Percent: 88 %
Brady Statistic RA Percent Paced: 11 %
HIGH POWER IMPEDANCE MEASURED VALUE: 52 Ohm
Implantable Lead Implant Date: 20080307
Implantable Lead Location: 753858
Implantable Lead Location: 753860
Implantable Lead Model: 7001
Lead Channel Impedance Value: 340 Ohm
Lead Channel Impedance Value: 450 Ohm
Lead Channel Impedance Value: 710 Ohm
Lead Channel Pacing Threshold Amplitude: 0.625 V
Lead Channel Pacing Threshold Amplitude: 0.75 V
Lead Channel Pacing Threshold Pulse Width: 0.5 ms
Lead Channel Sensing Intrinsic Amplitude: 11.3 mV
Lead Channel Setting Pacing Amplitude: 1.75 V
Lead Channel Setting Pacing Pulse Width: 0.5 ms
Lead Channel Setting Sensing Sensitivity: 0.5 mV
MDC IDC LEAD IMPLANT DT: 20080307
MDC IDC LEAD IMPLANT DT: 20080307
MDC IDC LEAD LOCATION: 753859
MDC IDC MSMT BATTERY REMAINING LONGEVITY: 28 mo
MDC IDC MSMT LEADCHNL LV PACING THRESHOLD PULSEWIDTH: 0.8 ms
MDC IDC MSMT LEADCHNL RA PACING THRESHOLD AMPLITUDE: 0.5 V
MDC IDC MSMT LEADCHNL RA SENSING INTR AMPL: 5 mV
MDC IDC MSMT LEADCHNL RV PACING THRESHOLD PULSEWIDTH: 0.5 ms
MDC IDC PG IMPLANT DT: 20130220
MDC IDC SESS DTM: 20181025093820
MDC IDC SET LEADCHNL LV PACING PULSEWIDTH: 0.8 ms
MDC IDC SET LEADCHNL RA PACING AMPLITUDE: 1.5 V
MDC IDC SET LEADCHNL RV PACING AMPLITUDE: 2 V
MDC IDC STAT BRADY AP VS PERCENT: 1 %
MDC IDC STAT BRADY AS VS PERCENT: 1 %
Pulse Gen Serial Number: 7027976

## 2017-08-27 ENCOUNTER — Encounter: Payer: Medicare Other | Admitting: Pharmacist

## 2017-08-27 ENCOUNTER — Ambulatory Visit (INDEPENDENT_AMBULATORY_CARE_PROVIDER_SITE_OTHER): Payer: Medicare Other | Admitting: Cardiovascular Disease

## 2017-08-27 ENCOUNTER — Encounter: Payer: Self-pay | Admitting: Cardiovascular Disease

## 2017-08-27 VITALS — BP 116/70 | HR 63 | Ht 62.0 in | Wt 175.0 lb

## 2017-08-27 DIAGNOSIS — I10 Essential (primary) hypertension: Secondary | ICD-10-CM | POA: Diagnosis not present

## 2017-08-27 DIAGNOSIS — I480 Paroxysmal atrial fibrillation: Secondary | ICD-10-CM

## 2017-08-27 DIAGNOSIS — Z7901 Long term (current) use of anticoagulants: Secondary | ICD-10-CM

## 2017-08-27 DIAGNOSIS — Z95810 Presence of automatic (implantable) cardiac defibrillator: Secondary | ICD-10-CM | POA: Diagnosis not present

## 2017-08-27 DIAGNOSIS — I5032 Chronic diastolic (congestive) heart failure: Secondary | ICD-10-CM

## 2017-08-27 NOTE — Patient Instructions (Signed)
Dr Croitoru recommends that you schedule a follow-up appointment in 12 months. You will receive a reminder letter in the mail two months in advance. If you don't receive a letter, please call our office to schedule the follow-up appointment.  If you need a refill on your cardiac medications before your next appointment, please call your pharmacy. 

## 2017-08-27 NOTE — Progress Notes (Signed)
Cardiology Office Note    Date:  08/27/2017   ID:  Danielle Mckinney, DOB 03/29/1951, MRN [id number]  PCP:  Maurice Small, MD  Cardiologist:  Cristopher Peru, M.D.; Sanda Klein, MD   No chief complaint on file.   History of Present Illness:  Danielle Mckinney is a 66 y.o. 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  Is very active.  Engages in line dancing, water aerobics and silver sneakers 5 days a week.  Denies dyspnea, palpitations, syncope or focal neurological events.  Not had any bleeding problems with warfarin.  Only needs diuretics 2 or 3 days every couple of weeks.  Takes potassium whenever she takes a furosemide tablet  Pacemaker download performed just last week shows normal device function with approximately 28 months of remaining battery life.  She has 99% biventricular pacing and 11% atrial pacing.  No evidence of recent atrial fibrillation.  Tachycardia detection is turned off on the ventricular lead, due to noise.  Past Medical History:  Diagnosis Date  . Atrial fibrillation ([place])   . Biventricular ICD (implantable cardioverter-defibrillator) in place    [place]  . Breast cancer ([place])   . Cancer (HCC)    Breast cancer-BRACA I gene  . CHF (congestive heart failure) ([place])   . CIN I (cervical intraepithelial neoplasia I)   . Fibroid   . HTN (hypertension)   . Nonischemic cardiomyopathy ([place])    related to anthracycline chemotherapy  . Stroke [facility])     Past Surgical History:  Procedure Laterality Date  . BIV ICD GENERTAOR CHANGE OUT  12/20/2011   [place]  . BIV ICD GENERTAOR CHANGE OUT N/A 12/20/2011   Procedure: BIV ICD GENERTAOR CHANGE OUT;  Surgeon: Evans Lance, MD;  Location: [facility] CATH LAB;  Service: Cardiovascular;  Laterality: N/A;  . BREAST SURGERY     Bilateral mastctomy and reconstruction TRAM  . CARDIAC DEFIBRILLATOR PLACEMENT    . CERVICAL CONE BIOPSY    . COLPOSCOPY    . GYNECOLOGIC CRYOSURGERY    . MASTECTOMY    . OOPHORECTOMY     BSO  . TRANSTHORACIC ECHOCARDIOGRAM  08/14/06  . TUBAL LIGATION    . US ECHOCARDIOGRAPHY  06/25/2012   borderline concentric LVH,LV systolic fx mildly reduced,impaired LV relaxation    Current Medications: Outpatient Medications Prior to Visit  Medication Sig Dispense Refill  . Calcium-Magnesium-Vitamin D (CALCIUM 1200+D3 PO) Take by mouth.    . carvedilol (COREG) 6.25 MG tablet take 1 and 1/2 tablets by mouth twice a day with meals 90 tablet 0  . Cholecalciferol (D3-1000) 1000 UNITS tablet Take 1,000 Units by mouth daily.    . fish oil-omega-3 fatty acids 1000 MG capsule Take 1,000 mg by mouth daily.     . furosemide (LASIX) 40 MG tablet Take 1 tablet (40 mg total) by mouth daily as needed. For swelling 90 tablet 0  . losartan (COZAAR) 100 MG tablet take 1/2 tablet by mouth twice a day 30 tablet 6  . losartan (COZAAR) 100 MG tablet Take 0.5 tablets (50 mg total) by mouth 2 (two) times daily. 90 tablet 2  . meclizine (ANTIVERT) 25 MG tablet take 1/2-1 tablet by mouth twice a day if needed  0  . potassium chloride SA (K-DUR,KLOR-CON) 20 MEQ tablet take 1 tablet by mouth once daily if needed *TAKE ALONG WITH FUROSEMIDE WHEN NEEDED*  30 tablet 3  . spironolactone (ALDACTONE) 25 MG tablet take 1 tablet by mouth once daily 30 tablet 0  . warfarin (COUMADIN) 4 MG tablet take 1 to 1 and 1/2 tablets by mouth as directed BY COUMADIN CLINIC 90 tablet 1   No facility-administered medications prior to visit.      Allergies:   Codeine; Lisinopril; Oxycodone; Penicillins; and Sulfa antibiotics   Social History   Social History  . Marital status: Divorced    Spouse name: N/A  . Number of children: N/A  . Years of education: N/A   Social History Main Topics  . Smoking status: Never Smoker  . Smokeless  tobacco: Never Used  . Alcohol use No  . Drug use: No  . Sexual activity: Yes    Birth control/ protection: Surgical     Comment: interecourse age 18, sexual partners more than 5   Other Topics Concern  . None   Social History Narrative  . None     Family History:  The patient's family history includes Breast cancer in her sister; Cirrhosis in her father; Clotting disorder in her mother; Coronary artery disease in her other; Heart disease in her maternal grandfather; Hypertension in her brother and sister.   ROS:   Please see the history of present illness.    ROS All other systems reviewed and are negative.   PHYSICAL EXAM:   VS:  BP 116/70   Pulse 63   Ht 5\' 2"  (1.575 m)   Wt 175 lb (79.4 kg)   BMI 32.01 kg/m     General: Alert, oriented x3, no distress, mildly obese Head: no evidence of trauma, PERRL, EOMI, no exophtalmos or lid lag, no myxedema, no xanthelasma; normal ears, nose and oropharynx Neck: normal jugular venous pulsations and no hepatojugular reflux; brisk carotid pulses without delay and no carotid bruits Chest: clear to auscultation, no signs of consolidation by percussion or palpation, normal fremitus, symmetrical and full respiratory excursions.  Healthy left subclavian defibrillator site, some signs of collateral venous circulation. Scars well healed. Cardiovascular: normal position and quality of the apical impulse, regular rhythm, normal first and paradoxically split second heart sounds, no murmurs, rubs or gallops Abdomen: no tenderness or distention, no masses by palpation, no abnormal pulsatility or arterial bruits, normal bowel sounds, no hepatosplenomegaly Extremities: no clubbing, cyanosis or edema; 2+ radial, ulnar and brachial pulses bilaterally; 2+ right femoral, posterior tibial and dorsalis pedis pulses; 2+ left femoral, posterior tibial and dorsalis pedis pulses; no subclavian or femoral bruits Neurological: grossly nonfocal Psych: Normal mood and  affect   Wt Readings from Last 3 Encounters:  08/27/17 175 lb (79.4 kg)  07/19/17 177 lb 3.2 oz (80.4 kg)  07/09/17 176 lb (79.8 kg)      Studies/Labs Reviewed:   EKG:  EKG is ordered today.  Sinus rhythm, atrial sensed-ventricular paced rhythm with quite narrow QRS 108 ms, small R waves V1-V2, QTC 440 ms  Recent Labs: 07/12/2017: ALT 8; BUN 12.7; Creatinine 0.8; Potassium 4.2; Sodium 139 07/17/2017: Hemoglobin 12.5; Platelets 230    ASSESSMENT:    1. Chronic diastolic CHF (congestive heart failure) ([place])   2. Automatic implantable cardioverter-defibrillator in situ   3. Paroxysmal atrial fibrillation (HCC)   4. Long term current use of anticoagulant therapy   5. Essential hypertension      PLAN:  In order of problems listed above:  1. CHF: Only requires intermittent diuretics, well compensated functional class II, euvolemic today.  On ARB and  carvedilol. 2. CRT-P: Biventricular defibrillator reprogrammed without tachycardia therapies due to artifact on the ventricular channel. Reasonably good biventricular pacing deficiency. Normal right ventricular pacing thresholds. Note possible occlusion of the left subclavian vein as evidenced by collateral formation in the skin. Would avoid placing a new right ventricular lead unless absolutely necessary. 3. AFib: Recent device interrogation shows no atrial fibrillation in the last several months.  CHADSVasc 5 (gender, hypertension, heart failure, previous stroke). 4. Warfarin: Note history of stroke.  No bleeding complications. 5. HTN: Appropriate control. She did not tolerate higher doses of beta blocker in the past.    Medication Adjustments/Labs and Tests Ordered: Current medicines are reviewed at length with the patient today.  Concerns regarding medicines are outlined above.  Medication changes, Labs and Tests ordered today are listed in the Patient Instructions below. Patient Instructions  Dr Sallyanne Kuster recommends that you  schedule a follow-up appointment in 12 months. You will receive a reminder letter in the mail two months in advance. If you don't receive a letter, please call our office to schedule the follow-up appointment.  If you need a refill on your cardiac medications before your next appointment, please call your pharmacy.    Signed, Sanda Klein, MD  08/27/2017 3:26 PM    [facility], [place], [place]  [address] Phone: [phone]; Fax: [phone]

## 2017-09-04 ENCOUNTER — Ambulatory Visit (INDEPENDENT_AMBULATORY_CARE_PROVIDER_SITE_OTHER): Payer: Medicare Other | Admitting: Pharmacist Clinician (PhC)/ Clinical Pharmacy Specialist

## 2017-09-04 DIAGNOSIS — Z7901 Long term (current) use of anticoagulants: Secondary | ICD-10-CM | POA: Diagnosis not present

## 2017-09-04 DIAGNOSIS — I480 Paroxysmal atrial fibrillation: Secondary | ICD-10-CM

## 2017-09-04 LAB — POCT INR: INR: 1.7

## 2017-09-06 NOTE — Progress Notes (Signed)
This encounter was created in error - please disregard.

## 2017-09-17 ENCOUNTER — Telehealth: Payer: Self-pay

## 2017-09-17 MED ORDER — CARVEDILOL 6.25 MG PO TABS: ORAL_TABLET | ORAL | 3 refills | Status: DC

## 2017-09-17 NOTE — Telephone Encounter (Signed)
Refill sent to the pharmacy electronically.  

## 2017-09-17 NOTE — Telephone Encounter (Signed)
Received voice mail message from patient requesting a refill for Coreg.

## 2017-09-27 ENCOUNTER — Ambulatory Visit (INDEPENDENT_AMBULATORY_CARE_PROVIDER_SITE_OTHER): Payer: Medicare Other

## 2017-09-27 DIAGNOSIS — I5032 Chronic diastolic (congestive) heart failure: Secondary | ICD-10-CM

## 2017-09-27 DIAGNOSIS — Z95810 Presence of automatic (implantable) cardiac defibrillator: Secondary | ICD-10-CM

## 2017-09-28 ENCOUNTER — Telehealth: Payer: Self-pay

## 2017-09-28 NOTE — Progress Notes (Signed)
EPIC Encounter for ICM Monitoring  Patient Name: Danielle Mckinney is a 66 y.o. female Date: 09/28/2017 Primary Care Physican: Maurice Small, MD Primary Cardiologist: Croitoru Electrophysiologist: Lovena Le Dry Weight: Last weight 176lbs  Bi-V Pacing: >99%           Attempted call to patient and unable to reach.  Left message to return call.  Transmission reviewed.    Thoracic impedance normal.  Prescribed dosage: Furosemide 40 mg 1 tablet as needed and Potassium 20 mEq 1 tablet to be taken when taking Furosemide.  Labs: 07/12/2017 Creatinine 0.8, BUN 12.7, Potassium 4.2, Sodium 139, EGFR 89 07/12/2016 Creatinine 0.84, BUN 21, Potassium 4.1, Sodium 137  Recommendations:  NONE - Unable to reach.  Follow-up plan: ICM clinic phone appointment on 11/01/2017.    Copy of ICM check sent to Dr. Lovena Le.   3 month ICM trend: 09/27/2017    1 Year ICM trend:       [name], RN 09/28/2017 1:21 PM

## 2017-09-28 NOTE — Telephone Encounter (Signed)
Remote ICM transmission received.  Attempted call to patient and left message to return call. 

## 2017-10-16 ENCOUNTER — Telehealth: Payer: Self-pay

## 2017-10-16 NOTE — Telephone Encounter (Signed)
Received voice mail message from patient asking when the next ICM transmission was scheduled.  Patient left message she is feeling fine and not having any problems at this time.    Attempted return call and left message next ICM transmission is 11/01/2017 but to call if she has any fluid symptoms prior to that date.

## 2017-10-17 ENCOUNTER — Ambulatory Visit (INDEPENDENT_AMBULATORY_CARE_PROVIDER_SITE_OTHER): Payer: Medicare Other | Admitting: Pharmacist

## 2017-10-17 DIAGNOSIS — I639 Cerebral infarction, unspecified: Secondary | ICD-10-CM | POA: Diagnosis not present

## 2017-10-17 DIAGNOSIS — Z7901 Long term (current) use of anticoagulants: Secondary | ICD-10-CM | POA: Diagnosis not present

## 2017-10-17 DIAGNOSIS — I4891 Unspecified atrial fibrillation: Secondary | ICD-10-CM

## 2017-10-17 LAB — POCT INR: INR: 1.8

## 2017-11-01 ENCOUNTER — Ambulatory Visit (INDEPENDENT_AMBULATORY_CARE_PROVIDER_SITE_OTHER): Payer: Medicare Other

## 2017-11-01 DIAGNOSIS — I5032 Chronic diastolic (congestive) heart failure: Secondary | ICD-10-CM

## 2017-11-01 DIAGNOSIS — Z95810 Presence of automatic (implantable) cardiac defibrillator: Secondary | ICD-10-CM | POA: Diagnosis not present

## 2017-11-02 NOTE — Progress Notes (Signed)
EPIC Encounter for ICM Monitoring  Patient Name: Danielle Mckinney is a 67 y.o. female Date: 11/02/2017 Primary Care Physican: Maurice Small, MD Primary Cardiologist: Croitoru Electrophysiologist: Lovena Le Dry Weight: 177lbs  Bi-V Pacing: >99%      Heart Failure questions reviewed, pt asymptomatic.   Thoracic impedance normal.  Prescribed dosage: Furosemide 40 mg 1 tablet as needed and Potassium 20 mEq 1 tablet to be taken when taking Furosemide.  Labs: 07/12/2017 Creatinine 0.8, BUN 12.7, Potassium 4.2, Sodium 139, EGFR 89 07/12/2016 Creatinine 0.84, BUN 21, Potassium 4.1, Sodium 137  Recommendations: No changes.   Encouraged to call for fluid symptoms.  Follow-up plan: ICM clinic phone appointment on 12/03/2017.    Copy of ICM check sent to Dr. Lovena Le.   3 month ICM trend: 11/01/2017    1 Year ICM trend:       [name], RN 11/02/2017 10:00 AM

## 2017-11-06 ENCOUNTER — Other Ambulatory Visit: Payer: Self-pay | Admitting: Cardiovascular Disease

## 2017-11-14 ENCOUNTER — Ambulatory Visit (INDEPENDENT_AMBULATORY_CARE_PROVIDER_SITE_OTHER): Payer: Medicare Other | Admitting: Obstetrics & Gynecology

## 2017-11-14 ENCOUNTER — Ambulatory Visit (INDEPENDENT_AMBULATORY_CARE_PROVIDER_SITE_OTHER): Payer: Medicare Other | Admitting: Pharmacist

## 2017-11-14 ENCOUNTER — Encounter: Payer: Self-pay | Admitting: Obstetrics & Gynecology

## 2017-11-14 VITALS — BP 140/70

## 2017-11-14 DIAGNOSIS — I480 Paroxysmal atrial fibrillation: Secondary | ICD-10-CM

## 2017-11-14 DIAGNOSIS — Z7901 Long term (current) use of anticoagulants: Secondary | ICD-10-CM

## 2017-11-14 DIAGNOSIS — Z113 Encounter for screening for infections with a predominantly sexual mode of transmission: Secondary | ICD-10-CM | POA: Diagnosis not present

## 2017-11-14 DIAGNOSIS — N898 Other specified noninflammatory disorders of vagina: Secondary | ICD-10-CM

## 2017-11-14 DIAGNOSIS — Z01419 Encounter for gynecological examination (general) (routine) without abnormal findings: Secondary | ICD-10-CM | POA: Diagnosis not present

## 2017-11-14 LAB — POCT INR: INR: 1.9

## 2017-11-14 NOTE — Progress Notes (Signed)
    Danielle Mckinney 19-May-1951 [id number]        67 y.o.  [id number] Divorced.  Has a boyfriend  RP: Patient thinks that she might have a condom in the vagina  HPI:  Had IC with her boyfriend who used a condom.  Condom was lost and they couldn't find it.  Presenting to make sure it is not in her vagina.  No pelvic pain.  No vaginal d/c or odor.  No PMB.  No change otherwise since I saw her for her Gynecologic annual exam 07/09/2017:  Menopause.  No HRT.  No PMB.  H/O LEEP.  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  Followed by Dr Jana Hakim.  S/P Bilateral Mastectomy and Bilateral Oophorectomy.  No pelvic pain.  H/O Heart Failure/Pace Maker.  H/O Stroke.  Exercising 5 times a week.  Mictions/BMs wnl.   Past medical history,surgical history, problem list, medications, allergies, family history and social history were all reviewed and documented in the EPIC chart.  Directed ROS with pertinent positives and negatives documented in the history of present illness/assessment and plan.  Exam:  Vitals:   11/14/17 1611  BP: 140/70   General appearance:  Normal  Gyn exam:  Vulva normal.  Speculum:  Cervix/Vagina normal.  Secretions normal.  Wet prep/Gono-chlam done.  Bimanual exam:  No condom or any other foreign object found in vagina.  Wet prep negative   Assessment/Plan:  67 y.o. [id number]   1. Gynecologic exam normal Lost condom, but normal gynecologic exam today.  No foreign object, no condom in the vagina.  2. Screening examination for venereal disease Given condom failure STI screen done. - C. trachomatis/N. gonorrhoeae RNA  3. Vaginal discharge Vaginal discharge within normal, but given history, wet prep done.  Wet prep negative.  Patient reassured. - WET PREP FOR [place], YEAST, CLUE  Counseling on above issues more than 50% for 15 minutes.  Princess Bruins MD, 4:24 PM 11/14/2017

## 2017-11-15 LAB — WET PREP FOR TRICH, YEAST, CLUE

## 2017-11-16 LAB — C. TRACHOMATIS/N. GONORRHOEAE RNA
C. trachomatis RNA, TMA: NOT DETECTED
N. gonorrhoeae RNA, TMA: NOT DETECTED

## 2017-11-18 ENCOUNTER — Encounter: Payer: Self-pay | Admitting: Obstetrics & Gynecology

## 2017-11-18 NOTE — Patient Instructions (Signed)
1. Gynecologic exam normal Lost condom, but normal gynecologic exam today.  No foreign object, no condom in the vagina.  2. Screening examination for venereal disease Given condom failure STI screen done. - C. trachomatis/N. gonorrhoeae RNA  3. Vaginal discharge Vaginal discharge within normal, but given history, wet prep done.  Wet prep negative.  Patient reassured. - WET PREP FOR [place], YEAST, CLUE  Caleb, good seeing you today!

## 2017-12-03 ENCOUNTER — Encounter: Payer: Medicare Other | Admitting: *Deleted

## 2017-12-03 ENCOUNTER — Telehealth: Payer: Self-pay | Admitting: Cardiology

## 2017-12-03 NOTE — Telephone Encounter (Signed)
LMOVM reminding pt to send remote transmission.   

## 2017-12-05 ENCOUNTER — Encounter: Payer: Self-pay | Admitting: Cardiology

## 2017-12-06 NOTE — Progress Notes (Signed)
No ICM remote transmission received for 12/03/2017 and next ICM transmission scheduled for 12/24/2017.

## 2017-12-08 ENCOUNTER — Other Ambulatory Visit: Payer: Self-pay | Admitting: Cardiovascular Disease

## 2017-12-10 ENCOUNTER — Telehealth: Payer: Self-pay

## 2017-12-10 DIAGNOSIS — I5032 Chronic diastolic (congestive) heart failure: Secondary | ICD-10-CM

## 2017-12-10 MED ORDER — FUROSEMIDE 40 MG PO TABS: ORAL_TABLET | ORAL | 3 refills | Status: DC

## 2017-12-10 NOTE — Telephone Encounter (Signed)
Refill sent to [facility] in [place] as requested by patient

## 2017-12-10 NOTE — Telephone Encounter (Signed)
Per Dr Victorino December message, can refill Furosemide.

## 2017-12-10 NOTE — Telephone Encounter (Signed)
Returned call to patient as requested by voice mail.  She is currently staying with her daughter at a hospital in [place].  Her daughter was admitted a couple of weeks ago.  Patient asked the refill be sent to [facility] at [address] in [place].  The pharmacy phone number is [phone].  Advised I would send refill when I  get Dr Croitoru's approval.  She stated that would be fine. She said she is feeling fine at this time. Advised next ICM remote transmission will be 12/24/2017 but if is having any fluid symptoms when she returns home to call.  She has Potassium to take with the Furosemide if needed.

## 2017-12-10 NOTE — Telephone Encounter (Signed)
REFILL 

## 2017-12-18 ENCOUNTER — Ambulatory Visit (INDEPENDENT_AMBULATORY_CARE_PROVIDER_SITE_OTHER): Payer: Medicare Other | Admitting: Pharmacist Clinician (PhC)/ Clinical Pharmacy Specialist

## 2017-12-18 DIAGNOSIS — I480 Paroxysmal atrial fibrillation: Secondary | ICD-10-CM | POA: Diagnosis not present

## 2017-12-18 DIAGNOSIS — Z7901 Long term (current) use of anticoagulants: Secondary | ICD-10-CM | POA: Diagnosis not present

## 2017-12-18 LAB — POCT INR: INR: 2.5

## 2017-12-18 NOTE — Patient Instructions (Signed)
Description   Continue with 1 tablet each Monday and Friday, 1.5 tablets all other days.  Repeat INR in 6 weeks     

## 2017-12-24 ENCOUNTER — Ambulatory Visit (INDEPENDENT_AMBULATORY_CARE_PROVIDER_SITE_OTHER): Payer: Medicare Other

## 2017-12-24 DIAGNOSIS — Z95810 Presence of automatic (implantable) cardiac defibrillator: Secondary | ICD-10-CM

## 2017-12-24 DIAGNOSIS — I5032 Chronic diastolic (congestive) heart failure: Secondary | ICD-10-CM | POA: Diagnosis not present

## 2017-12-24 NOTE — Progress Notes (Signed)
EPIC Encounter for ICM Monitoring  Patient Name: BURNIE HANK is a 67 y.o. female Date: 12/24/2017 Primary Care Physican: Maurice Small, MD Primary Cardiologist: Croitoru Electrophysiologist: Lovena Le Dry Weight: Previous weight 177lbs  Bi-V Pacing: >99%      Attempted call to patient and unable to reach.  Left detailed message regarding transmission.  Transmission reviewed.    Thoracic impedance starting to trend below baseline suggesting fluid accumulation.  Prescribed dosage: Furosemide 40 mg 1 tablet as needed and Potassium 20 mEq 1 tablet to be taken when taking Furosemide.  Labs: 07/12/2017 Creatinine 0.8, BUN 12.7, Potassium 4.2, Sodium 139, EGFR 89 07/12/2016 Creatinine 0.84, BUN 21, Potassium 4.1, Sodium 137  Recommendations: Left voice mail with ICM number and encouraged to call if experiencing any fluid symptoms.  Follow-up plan: ICM clinic phone appointment on 01/24/2018.    Copy of ICM check sent to Dr. Sallyanne Kuster.   3 month ICM trend: 12/24/2017    1 Year ICM trend:       [name], RN 12/24/2017 2:10 PM

## 2018-01-04 DIAGNOSIS — M79644 Pain in right finger(s): Secondary | ICD-10-CM | POA: Insufficient documentation

## 2018-01-04 DIAGNOSIS — R2230 Localized swelling, mass and lump, unspecified upper limb: Secondary | ICD-10-CM | POA: Insufficient documentation

## 2018-01-07 ENCOUNTER — Other Ambulatory Visit: Payer: Self-pay | Admitting: Cardiovascular Disease

## 2018-01-14 MED ORDER — LOSARTAN POTASSIUM 100 MG PO TABS: 50.0000 mg | ORAL_TABLET | Freq: Two times a day (BID) | ORAL | 2 refills | Status: DC

## 2018-01-14 MED ORDER — WARFARIN SODIUM 4 MG PO TABS: ORAL_TABLET | ORAL | 1 refills | Status: DC

## 2018-01-14 NOTE — Addendum Note (Signed)
Addended by: Harrington Challenger on: 01/14/2018 12:06 PM   Modules accepted: Orders

## 2018-01-14 NOTE — Addendum Note (Signed)
Addended by: Zebedee Iba on: 01/14/2018 12:05 PM   Modules accepted: Orders

## 2018-01-17 ENCOUNTER — Telehealth: Payer: Self-pay | Admitting: Cardiovascular Disease

## 2018-01-17 NOTE — Telephone Encounter (Signed)
Left message to call back  

## 2018-01-17 NOTE — Telephone Encounter (Signed)
Advised patient to contact her pharmacy regarding recall,  Verbalized understanding

## 2018-01-17 NOTE — Telephone Encounter (Signed)
New Message  Pt c/o medication issue:  1. Name of Medication: losartan (COZAAR) 100 MG tablet  2. How are you currently taking this medication (dosage and times per day)? Take 0.5 tablets (50 mg total) by mouth 2 (two) times daily.  3. Are you having a reaction (difficulty breathing--STAT)? no  4. What is your medication issue? Pt states that she heard that the Losartan is on recall and she wants to know if she should be concerned

## 2018-01-21 IMAGING — DX DG CHEST 2V
2 series · 2 of 2 positions shown · non-contrast
Comparison: Chest x-ray dated 04/14/2015.

CLINICAL DATA: Follow-up left breast cancer.  Hypertension.

EXAM:
CHEST  2 VIEW

[chest pa]
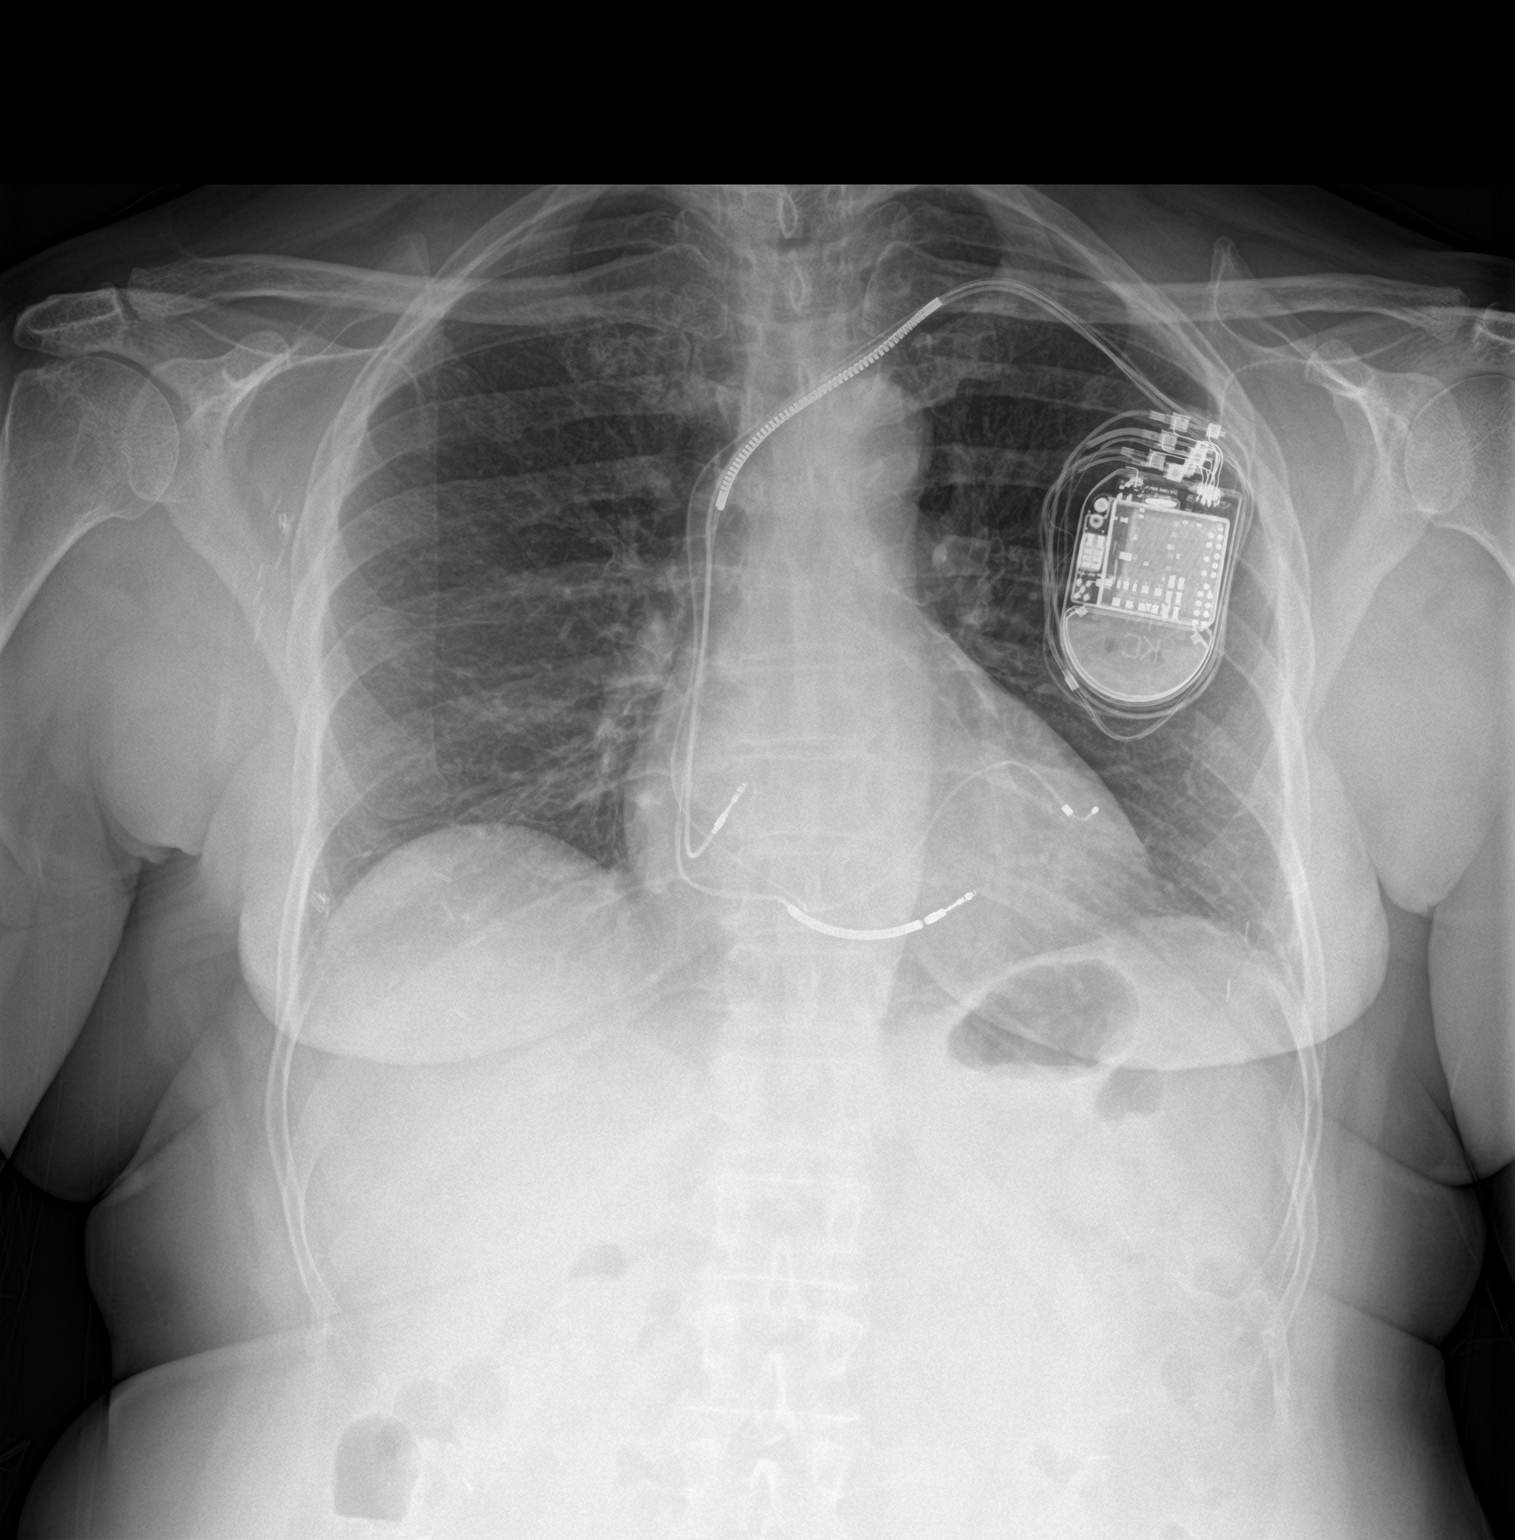

[chest lat]
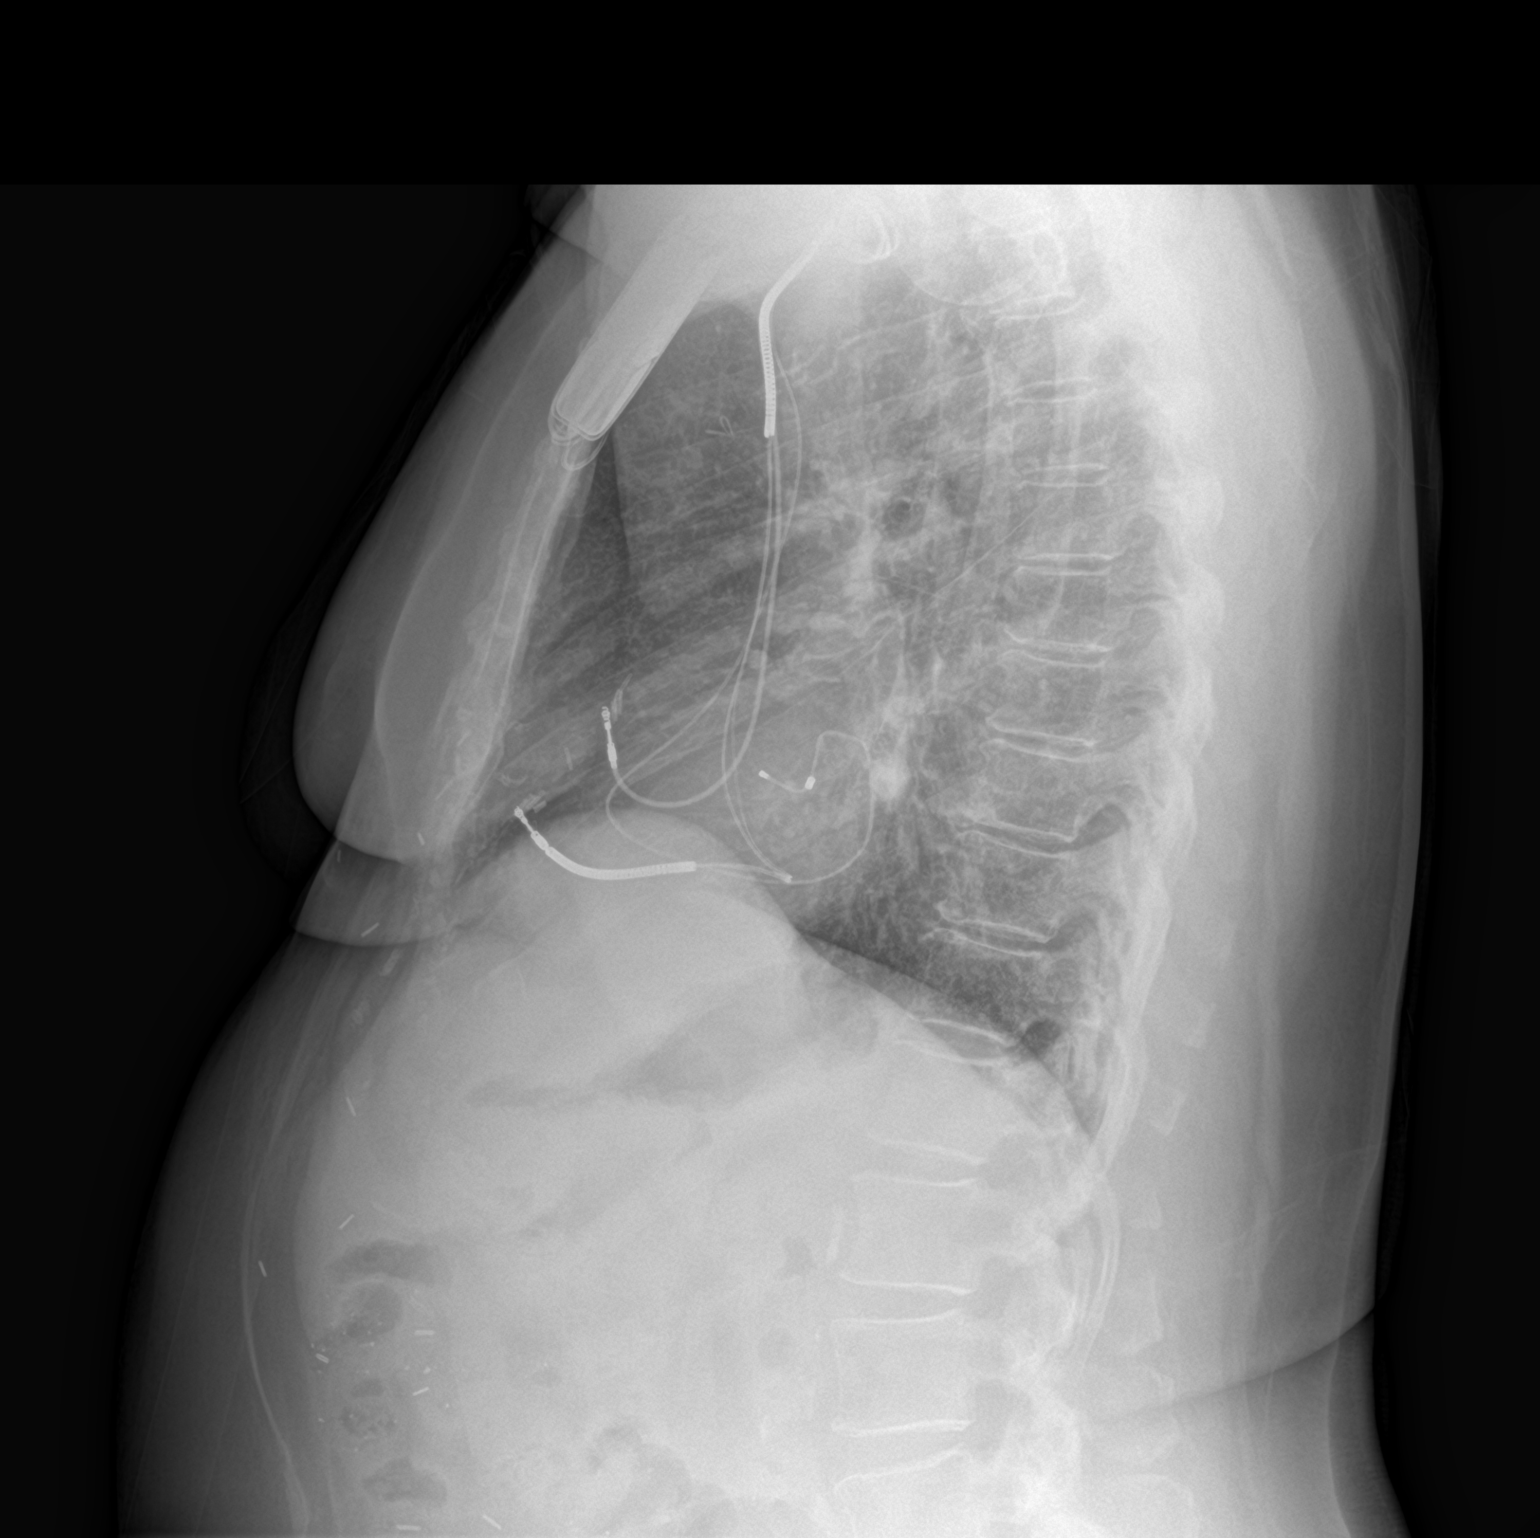

[2 of 2 positions shown; findings below may reference images not displayed]

FINDINGS: Heart size and mediastinal contours are within normal limits. Left
chest wall pacemaker/ICD apparatus appears stable in configuration
lungs are clear. No pulmonary nodule or mass seen. No evidence of
pneumonia or pulmonary edema. No pleural effusion. No acute or
suspicious osseous finding. Atherosclerotic changes noted at the
aortic arch.
IMPRESSION: 1. No active cardiopulmonary disease. No evidence of neoplastic
process.
2. Aortic atherosclerosis.

## 2018-01-24 ENCOUNTER — Ambulatory Visit (INDEPENDENT_AMBULATORY_CARE_PROVIDER_SITE_OTHER): Payer: Medicare Other

## 2018-01-24 DIAGNOSIS — I5032 Chronic diastolic (congestive) heart failure: Secondary | ICD-10-CM | POA: Diagnosis not present

## 2018-01-24 DIAGNOSIS — Z95810 Presence of automatic (implantable) cardiac defibrillator: Secondary | ICD-10-CM

## 2018-01-24 NOTE — Progress Notes (Signed)
EPIC Encounter for ICM Monitoring  Patient Name: Danielle Mckinney is a 67 y.o. female Date: 01/24/2018 Primary Care Physican: Maurice Small, MD Primary Cardiologist: Croitoru Electrophysiologist: Lovena Le Dry Weight: Previous weight 177lbs  Bi-V Pacing: >99%       Attempted call to patient and unable to reach.  Left detailed message regarding transmission.  Transmission reviewed.    Thoracic impedance slightly abnormal suggesting fluid accumulation .  Prescribed dosage: Furosemide 40 mg 1 tablet as needed and Potassium 20 mEq 1 tablet to be taken when taking Furosemide.  Labs: 07/12/2017 Creatinine 0.8, BUN 12.7, Potassium 4.2, Sodium 139, EGFR 89 07/12/2016 Creatinine 0.84, BUN 21, Potassium 4.1, Sodium 137  Recommendations:   Left voice mail with ICM number and encouraged to call if experiencing any fluid symptoms.  Follow-up plan: ICM clinic phone appointment on 02/25/2018.    Copy of ICM check sent to Dr. Lovena Le and Dr. Sallyanne Kuster.   3 month ICM trend: 01/24/2018    1 Year ICM trend:       [name], RN 01/24/2018 9:09 AM

## 2018-01-25 ENCOUNTER — Telehealth: Payer: Self-pay

## 2018-01-25 NOTE — Telephone Encounter (Signed)
Remote ICM transmission received.  Attempted call to patient and left detailed message per DPR regarding transmission and next ICM scheduled for 02/25/2018.  Advised to return call for any fluid symptoms or questions.

## 2018-01-25 NOTE — Progress Notes (Signed)
Thanks MCr 

## 2018-02-05 ENCOUNTER — Ambulatory Visit (INDEPENDENT_AMBULATORY_CARE_PROVIDER_SITE_OTHER): Payer: Medicare Other | Admitting: Pharmacist Clinician (PhC)/ Clinical Pharmacy Specialist

## 2018-02-05 DIAGNOSIS — I480 Paroxysmal atrial fibrillation: Secondary | ICD-10-CM

## 2018-02-05 DIAGNOSIS — Z7901 Long term (current) use of anticoagulants: Secondary | ICD-10-CM

## 2018-02-05 LAB — POCT INR: INR: 2.5

## 2018-02-05 NOTE — Patient Instructions (Signed)
Description   Continue with 1 tablet each Monday and Friday, 1.5 tablets all other days.  Repeat INR in 6 weeks     

## 2018-02-25 ENCOUNTER — Ambulatory Visit (INDEPENDENT_AMBULATORY_CARE_PROVIDER_SITE_OTHER): Payer: Medicare Other

## 2018-02-25 DIAGNOSIS — I5032 Chronic diastolic (congestive) heart failure: Secondary | ICD-10-CM | POA: Diagnosis not present

## 2018-02-25 DIAGNOSIS — Z95810 Presence of automatic (implantable) cardiac defibrillator: Secondary | ICD-10-CM

## 2018-02-26 NOTE — Progress Notes (Signed)
EPIC Encounter for ICM Monitoring  Patient Name: KELANI ROBART is a 67 y.o. female Date: 02/26/2018 Primary Care Physican: Maurice Small, MD Primary Cardiologist: Croitoru Electrophysiologist: Lovena Le Dry Weight:177lbs  Bi-V Pacing: >99%      Heart Failure questions reviewed, pt asymptomatic.   Thoracic impedance normal.  Prescribed dosage: Furosemide 40 mg 1 tablet as needed and Potassium 20 mEq 1 tablet to be taken when taking Furosemide.  Labs: 07/12/2017 Creatinine 0.8, BUN 12.7, Potassium 4.2, Sodium 139, EGFR 89 07/12/2016 Creatinine 0.84, BUN 21, Potassium 4.1, Sodium 137  Recommendations: No changes.   Encouraged to call for fluid symptoms.  Follow-up plan: ICM clinic phone appointment on 03/28/2018.    Copy of ICM check sent to Dr. Lovena Le.   3 month ICM trend: 02/25/2018    1 Year ICM trend:       [name], RN 02/26/2018 1:16 PM

## 2018-03-19 ENCOUNTER — Ambulatory Visit (INDEPENDENT_AMBULATORY_CARE_PROVIDER_SITE_OTHER): Payer: Medicare Other | Admitting: Pharmacist Clinician (PhC)/ Clinical Pharmacy Specialist

## 2018-03-19 DIAGNOSIS — Z7901 Long term (current) use of anticoagulants: Secondary | ICD-10-CM | POA: Diagnosis not present

## 2018-03-19 DIAGNOSIS — I480 Paroxysmal atrial fibrillation: Secondary | ICD-10-CM | POA: Diagnosis not present

## 2018-03-19 LAB — POCT INR: INR: 1.8 — AB (ref 2.0–3.0)

## 2018-03-28 ENCOUNTER — Ambulatory Visit (INDEPENDENT_AMBULATORY_CARE_PROVIDER_SITE_OTHER): Payer: Medicare Other

## 2018-03-28 DIAGNOSIS — Z95810 Presence of automatic (implantable) cardiac defibrillator: Secondary | ICD-10-CM

## 2018-03-28 DIAGNOSIS — I5032 Chronic diastolic (congestive) heart failure: Secondary | ICD-10-CM

## 2018-03-29 ENCOUNTER — Telehealth: Payer: Self-pay

## 2018-03-29 NOTE — Telephone Encounter (Signed)
Remote ICM transmission received.  Attempted call to patient and left detailed message, per DPR, regarding transmission and next ICM scheduled for 04/29/2018.  Advised to return call for any fluid symptoms or questions.   

## 2018-03-29 NOTE — Progress Notes (Signed)
EPIC Encounter for ICM Monitoring  Patient Name: Danielle Mckinney is a 67 y.o. female Date: 03/29/2018 Primary Care Physican: Maurice Small, MD Primary Cardiologist: Croitoru Electrophysiologist: Lovena Le Dry Weight:Previous weight 177lbs  Bi-V Pacing: >99%       Attempted call to patient and unable to reach.  Left detailed message, per DPR, regarding transmission.  Transmission reviewed.    Thoracic impedance normal.  Prescribed dosage: Furosemide 40 mg 1 tablet as needed and Potassium 20 mEq 1 tablet to be taken when taking Furosemide.  Labs: 07/12/2017 Creatinine 0.8, BUN 12.7, Potassium 4.2, Sodium 139, EGFR 89 07/12/2016 Creatinine 0.84, BUN 21, Potassium 4.1, Sodium 137  Recommendations: Left voice mail with ICM number and encouraged to call if experiencing any fluid symptoms.  Follow-up plan: ICM clinic phone appointment on 04/29/2018.  Office appointment scheduled 06/14/2018 with Dr. Lovena Le.  Copy of ICM check sent to Dr. Lovena Le.   3 month ICM trend: 03/28/2018    1 Year ICM trend:       [name], RN 03/29/2018 9:48 AM

## 2018-04-03 ENCOUNTER — Other Ambulatory Visit: Payer: Self-pay | Admitting: Cardiovascular Disease

## 2018-04-29 ENCOUNTER — Ambulatory Visit (INDEPENDENT_AMBULATORY_CARE_PROVIDER_SITE_OTHER): Payer: Medicare Other

## 2018-04-29 ENCOUNTER — Ambulatory Visit (INDEPENDENT_AMBULATORY_CARE_PROVIDER_SITE_OTHER): Payer: Medicare Other | Admitting: *Deleted

## 2018-04-29 DIAGNOSIS — I5032 Chronic diastolic (congestive) heart failure: Secondary | ICD-10-CM

## 2018-04-29 DIAGNOSIS — I428 Other cardiomyopathies: Secondary | ICD-10-CM

## 2018-04-29 DIAGNOSIS — Z95810 Presence of automatic (implantable) cardiac defibrillator: Secondary | ICD-10-CM

## 2018-04-29 NOTE — Progress Notes (Signed)
Remote pacemaker transmission.   

## 2018-04-30 NOTE — Progress Notes (Signed)
EPIC Encounter for ICM Monitoring  Patient Name: Danielle Mckinney is a 67 y.o. female Date: 04/30/2018 Primary Care Physican: Maurice Small, MD Primary Cardiologist: Croitoru Electrophysiologist: Lovena Le Dry Weight:Previous weight 177lbs  Bi-V Pacing: >99%       Attempted call to patient and unable to reach.  Left detailed message, per DPR, regarding transmission.  Transmission reviewed.    Thoracic impedance normal.  Prescribed dosage: Furosemide 40 mg 1 tablet as needed and Potassium 20 mEq 1 tablet to be taken when taking Furosemide.  Labs: 07/12/2017 Creatinine 0.8, BUN 12.7, Potassium 4.2, Sodium 139, EGFR 89 07/12/2016 Creatinine 0.84, BUN 21, Potassium 4.1, Sodium 137  Recommendations: Left voice mail with ICM number and encouraged to call if experiencing any fluid symptoms.  Follow-up plan: ICM clinic phone appointment on 05/30/2018.  Office appointment scheduled 06/14/2018 with Dr. Lovena Le.  Copy of ICM check sent to Dr. Lovena Le.   3 month ICM trend: 04/30/2018    1 Year ICM trend:       [name], RN 04/30/2018 2:34 PM

## 2018-05-07 ENCOUNTER — Ambulatory Visit (INDEPENDENT_AMBULATORY_CARE_PROVIDER_SITE_OTHER): Payer: Medicare Other | Admitting: Pharmacist Clinician (PhC)/ Clinical Pharmacy Specialist

## 2018-05-07 DIAGNOSIS — Z7901 Long term (current) use of anticoagulants: Secondary | ICD-10-CM | POA: Diagnosis not present

## 2018-05-07 DIAGNOSIS — I480 Paroxysmal atrial fibrillation: Secondary | ICD-10-CM | POA: Diagnosis not present

## 2018-05-07 LAB — POCT INR: INR: 2.1 (ref 2.0–3.0)

## 2018-05-14 LAB — CUP PACEART REMOTE DEVICE CHECK
Battery Remaining Longevity: 22 mo
Battery Remaining Percentage: 25 %
Brady Statistic AS VP Percent: 93 %
Brady Statistic AS VS Percent: 1 %
Date Time Interrogation Session: 20190701090147
HighPow Impedance: 50 Ohm
Implantable Lead Implant Date: 20181119
Implantable Lead Location: 753859
Lead Channel Impedance Value: 310 Ohm
Lead Channel Pacing Threshold Amplitude: 0.5 V
Lead Channel Pacing Threshold Pulse Width: 0.5 ms
Lead Channel Sensing Intrinsic Amplitude: 5 mV
Lead Channel Setting Pacing Amplitude: 1.5 V
Lead Channel Setting Pacing Amplitude: 1.75 V
Lead Channel Setting Pacing Amplitude: 2 V
Lead Channel Setting Pacing Pulse Width: 0.5 ms
Lead Channel Setting Pacing Pulse Width: 0.8 ms
MDC IDC LEAD IMPLANT DT: 20181119
MDC IDC LEAD LOCATION: 753860
MDC IDC MSMT BATTERY VOLTAGE: 2.81 V
MDC IDC MSMT LEADCHNL LV IMPEDANCE VALUE: 700 Ohm
MDC IDC MSMT LEADCHNL LV PACING THRESHOLD AMPLITUDE: 0.75 V
MDC IDC MSMT LEADCHNL LV PACING THRESHOLD PULSEWIDTH: 0.8 ms
MDC IDC MSMT LEADCHNL RV IMPEDANCE VALUE: 450 Ohm
MDC IDC MSMT LEADCHNL RV PACING THRESHOLD AMPLITUDE: 0.75 V
MDC IDC MSMT LEADCHNL RV PACING THRESHOLD PULSEWIDTH: 0.5 ms
MDC IDC MSMT LEADCHNL RV SENSING INTR AMPL: 11.3 mV
MDC IDC PG IMPLANT DT: 20130220
MDC IDC SET LEADCHNL RV SENSING SENSITIVITY: 0.5 mV
MDC IDC STAT BRADY AP VP PERCENT: 6.1 %
MDC IDC STAT BRADY AP VS PERCENT: 1 %
MDC IDC STAT BRADY RA PERCENT PACED: 6 %
Pulse Gen Serial Number: 7027976

## 2018-05-30 ENCOUNTER — Ambulatory Visit (INDEPENDENT_AMBULATORY_CARE_PROVIDER_SITE_OTHER): Payer: Medicare Other

## 2018-05-30 DIAGNOSIS — I5032 Chronic diastolic (congestive) heart failure: Secondary | ICD-10-CM

## 2018-05-30 DIAGNOSIS — Z95810 Presence of automatic (implantable) cardiac defibrillator: Secondary | ICD-10-CM

## 2018-05-30 NOTE — Progress Notes (Signed)
EPIC Encounter for ICM Monitoring  Patient Name: Danielle Mckinney is a 67 y.o. female Date: 05/30/2018 Primary Care Physican: Maurice Small, MD Primary Cardiologist: Croitoru Electrophysiologist: Druscilla Brownie Weight:Previous [id number]  Bi-V Pacing: >99%      Attempted call to patient and unable to reach.    Transmission reviewed.    Thoracic impedance just below baseline normal.  Prescribed dosage: Furosemide 40 mg 1 tablet as needed and Potassium 20 mEq 1 tablet to be taken when taking Furosemide.  Labs: 07/12/2017 Creatinine 0.8, BUN 12.7, Potassium 4.2, Sodium 139, EGFR 89 07/12/2016 Creatinine 0.84, BUN 21, Potassium 4.1, Sodium 137  Recommendations: NONE - Unable to reach.  Follow-up plan: ICM clinic phone appointment on 07/15/2018.   Defib office appointment scheduled 06/14/2018 with Dr. Lovena Le.    Copy of ICM check sent to Dr. Lovena Le.   3 month ICM trend: 05/30/2018        [name], RN 05/30/2018 4:50 PM

## 2018-05-31 ENCOUNTER — Telehealth: Payer: Self-pay

## 2018-05-31 NOTE — Telephone Encounter (Signed)
Remote ICM transmission received.  Attempted call to patient and no answer or answering machine.  

## 2018-06-14 ENCOUNTER — Ambulatory Visit (INDEPENDENT_AMBULATORY_CARE_PROVIDER_SITE_OTHER): Payer: Medicare Other | Admitting: Internal Medicine

## 2018-06-14 ENCOUNTER — Encounter: Payer: Self-pay | Admitting: Internal Medicine

## 2018-06-14 ENCOUNTER — Encounter

## 2018-06-14 VITALS — BP 126/72 | HR 67 | Ht 62.0 in | Wt 184.0 lb

## 2018-06-14 DIAGNOSIS — I5032 Chronic diastolic (congestive) heart failure: Secondary | ICD-10-CM | POA: Diagnosis not present

## 2018-06-14 DIAGNOSIS — I10 Essential (primary) hypertension: Secondary | ICD-10-CM | POA: Diagnosis not present

## 2018-06-14 DIAGNOSIS — Z95810 Presence of automatic (implantable) cardiac defibrillator: Secondary | ICD-10-CM | POA: Diagnosis not present

## 2018-06-14 NOTE — Progress Notes (Signed)
HPI Ms. Rocks returns today for followup. She is a pleasant 67 yo woman with chronic systolic heart failure, s/p ICD insertion. She has done well in the interim. No chest pain, sob or syncope. No ICD shock. Allergies  Allergen Reactions  . Codeine Nausea And Vomiting  . Lisinopril Cough  . Oxycodone Nausea And Vomiting  . Penicillins Nausea And Vomiting  . Sulfa Antibiotics Nausea And Vomiting     Current Outpatient Medications  Medication Sig Dispense Refill  . Calcium-Magnesium-Vitamin D (CALCIUM 1200+D3 PO) Take by mouth.    . carvedilol (COREG) 6.25 MG tablet take 1 and 1/2 tablets by mouth twice a day with meals 270 tablet 3  . Cholecalciferol (D3-1000) 1000 UNITS tablet Take 1,000 Units by mouth daily.    . furosemide (LASIX) 40 MG tablet take 1 tablet by mouth once daily if needed for SWELLING 90 tablet 3  . Krill Oil 500 MG CAPS Take 500 mg by mouth daily.    [name] Kitchen losartan (COZAAR) 100 MG tablet Take 0.5 tablets (50 mg total) by mouth 2 (two) times daily. 90 tablet 2  . potassium chloride SA (K-DUR,KLOR-CON) 20 MEQ tablet take 1 tablet by mouth once daily if needed **TAKE ALONG WITH FUROSEMIDE WHEN NEEDED** 30 tablet 11  . spironolactone (ALDACTONE) 25 MG tablet take 1 tablet by mouth once daily 30 tablet 9  . warfarin (COUMADIN) 4 MG tablet TAKE 1 TO 1 AND 1/2 TABLETS BY MOUTH AS DIRECTED BY COUMADIN CLINIC 135 tablet 1   No current facility-administered medications for this visit.      Past Medical History:  Diagnosis Date  . Atrial fibrillation ([place])   . Automatic implantable cardioverter-defibrillator in situ 08/25/2009   Qualifier: Diagnosis of  By: Lovena Le, MD, [facility], Binnie Kand    . Biventricular ICD (implantable cardioverter-defibrillator) in place    St.Jude  . Breast cancer ([place])   . Cancer (HCC)    Breast cancer-BRACA I gene  . CHF 08/18/2009   Qualifier: Diagnosis of  By: Lynden Ang    . CHF (congestive heart failure) ([place])   . Chronic  diastolic CHF (congestive heart failure) ([place]) 04/23/2012  . CIN I (cervical intraepithelial neoplasia I)   . CVA (cerebral vascular accident) ([place]) 01/15/2013  . Essential hypertension 08/18/2009   Qualifier: Diagnosis of  By: Lynden Ang    . Fibroid   . Fibroid uterus 04/15/2013  . Genetic testing 07/06/2015   BRCA1 c.191G>A (C64Y) pathogenic mutation found at [facility] at [facility].  The report date is January 21, 1998.   [name] Kitchen HTN (hypertension)   . Long term current use of anticoagulant therapy 01/15/2013  . Nonischemic cardiomyopathy ([place])    related to anthracycline chemotherapy  . Stroke ([place])     ROS:   All systems reviewed and negative except as noted in the HPI.   Past Surgical History:  Procedure Laterality Date  . BIV ICD GENERTAOR CHANGE OUT  12/20/2011   St.Jude  . BIV ICD GENERTAOR CHANGE OUT N/A 12/20/2011   Procedure: BIV ICD GENERTAOR CHANGE OUT;  Surgeon: Evans Lance, MD;  Location: [facility] CATH LAB;  Service: Cardiovascular;  Laterality: N/A;  . BREAST SURGERY     Bilateral mastctomy and reconstruction TRAM  . CARDIAC DEFIBRILLATOR PLACEMENT    . CERVICAL CONE BIOPSY    . COLPOSCOPY    . GYNECOLOGIC CRYOSURGERY    . MASTECTOMY    . OOPHORECTOMY     BSO  .  TRANSTHORACIC ECHOCARDIOGRAM  08/14/06  . TUBAL LIGATION    . US ECHOCARDIOGRAPHY  06/25/2012   borderline concentric LVH,LV systolic fx mildly reduced,impaired LV relaxation     Family History  Problem Relation Age of Onset  . Breast cancer Sister        Age 52  . Hypertension Sister   . Heart disease Maternal Grandfather   . Clotting disorder Mother   . Cirrhosis Father   . Coronary artery disease Other   . Hypertension Brother      Social History   Socioeconomic History  . Marital status: Divorced    Spouse name: Not on file  . Number of children: Not on file  . Years of education: Not on file  . Highest education level: Not on file  Occupational History  . Not on file    Social Needs  . Financial resource strain: Not on file  . Food insecurity:    Worry: Not on file    Inability: Not on file  . Transportation needs:    Medical: Not on file    Non-medical: Not on file  Tobacco Use  . Smoking status: Never Smoker  . Smokeless tobacco: Never Used  Substance and Sexual Activity  . Alcohol use: No    Alcohol/week: 0.0 standard drinks  . Drug use: No  . Sexual activity: Yes    Birth control/protection: Surgical    Comment: interecourse age 52, sexual partners more than 5  Lifestyle  . Physical activity:    Days per week: Not on file    Minutes per session: Not on file  . Stress: Not on file  Relationships  . Social connections:    Talks on phone: Not on file    Gets together: Not on file    Attends religious service: Not on file    Active member of club or organization: Not on file    Attends meetings of clubs or organizations: Not on file    Relationship status: Not on file  . Intimate partner violence:    Fear of current or ex partner: Not on file    Emotionally abused: Not on file    Physically abused: Not on file    Forced sexual activity: Not on file  Other Topics Concern  . Not on file  Social History Narrative  . Not on file     BP 126/72   Pulse 67   Ht _0  (1.575 m)   Wt 184 lb (83.5 kg)   BMI 33.65 kg/m   Physical Exam:  Well appearing 67 yo woman, NAD HEENT: Unremarkable Neck:  No JVD, no thyromegally Lymphatics:  No adenopathy Back:  No CVA tenderness Lungs:  Clear with no wheezes HEART:  Regular rate rhythm, no murmurs, no rubs, no clicks Abd:  soft, positive bowel sounds, no organomegally, no rebound, no guarding Ext:  2 plus pulses, no edema, no cyanosis, no clubbing Skin:  No rashes no nodules Neuro:  CN II through XII intact, motor grossly intact  EKG - nsr  DEVICE  Normal device function.  See PaceArt for details.   Assess/Plan: 1. ICD - her St. Jude biv ICD is working normally. 2. Chronic  systolic heart failure - her symptoms are class 2. She will continue her current meds. 3. HTN - her blood pressure is well controlled. Will follow.  [name]

## 2018-06-14 NOTE — Patient Instructions (Signed)
Medication Instructions:  Your physician recommends that you continue on your current medications as directed. Please refer to the Current Medication list given to you today.  Labwork: None ordered.  Testing/Procedures: None ordered.  Follow-Up: Your physician wants you to follow-up in: one year with Dr. Lovena Le.  You will receive a reminder letter in the mail two months in advance. If you don't receive a letter, please call our office to schedule the follow-up appointment.  Remote monitoring is used to monitor your ICD from home. This monitoring reduces the number of office visits required to check your device to one time per year. It allows [place] to keep an eye on the functioning of your device to ensure it is working properly. You are scheduled for a device check from home on 07/15/2018. You may send your transmission at any time that day. If you have a wireless device, the transmission will be sent automatically. After your physician reviews your transmission, you will receive a postcard with your next transmission date.  Any Other Special Instructions Will Be Listed Below (If Applicable).  If you need a refill on your cardiac medications before your next appointment, please call your pharmacy.

## 2018-06-15 LAB — CUP PACEART INCLINIC DEVICE CHECK
Battery Remaining Longevity: 20 mo
Brady Statistic RA Percent Paced: 6.3 %
Brady Statistic RV Percent Paced: 99.58 %
Date Time Interrogation Session: 20190816180445
HIGH POWER IMPEDANCE MEASURED VALUE: 48.4303
Implantable Lead Implant Date: 20181119
Implantable Lead Implant Date: 20181119
Implantable Lead Location: 753859
Lead Channel Impedance Value: 475 Ohm
Lead Channel Pacing Threshold Amplitude: 0.75 V
Lead Channel Pacing Threshold Amplitude: 0.75 V
Lead Channel Pacing Threshold Pulse Width: 0.5 ms
Lead Channel Pacing Threshold Pulse Width: 0.5 ms
Lead Channel Pacing Threshold Pulse Width: 0.8 ms
Lead Channel Setting Pacing Amplitude: 1.5 V
Lead Channel Setting Pacing Amplitude: 2 V
Lead Channel Setting Pacing Pulse Width: 0.8 ms
Lead Channel Setting Sensing Sensitivity: 0.5 mV
MDC IDC LEAD LOCATION: 753860
MDC IDC MSMT LEADCHNL LV IMPEDANCE VALUE: 650 Ohm
MDC IDC MSMT LEADCHNL LV PACING THRESHOLD AMPLITUDE: 0.75 V
MDC IDC MSMT LEADCHNL LV PACING THRESHOLD PULSEWIDTH: 0.8 ms
MDC IDC MSMT LEADCHNL RA IMPEDANCE VALUE: 337.5 Ohm
MDC IDC MSMT LEADCHNL RA PACING THRESHOLD AMPLITUDE: 0.5 V
MDC IDC MSMT LEADCHNL RA SENSING INTR AMPL: 5 mV
MDC IDC MSMT LEADCHNL RV PACING THRESHOLD AMPLITUDE: 0.75 V
MDC IDC MSMT LEADCHNL RV PACING THRESHOLD PULSEWIDTH: 0.5 ms
MDC IDC MSMT LEADCHNL RV SENSING INTR AMPL: 11.3 mV
MDC IDC PG IMPLANT DT: 20181119
MDC IDC PG SERIAL: 7027976
MDC IDC SET LEADCHNL LV PACING AMPLITUDE: 1.75 V
MDC IDC SET LEADCHNL RV PACING PULSEWIDTH: 0.5 ms

## 2018-06-18 ENCOUNTER — Ambulatory Visit (INDEPENDENT_AMBULATORY_CARE_PROVIDER_SITE_OTHER): Payer: Medicare Other | Admitting: Pharmacist Clinician (PhC)/ Clinical Pharmacy Specialist

## 2018-06-18 DIAGNOSIS — I480 Paroxysmal atrial fibrillation: Secondary | ICD-10-CM

## 2018-06-18 DIAGNOSIS — Z7901 Long term (current) use of anticoagulants: Secondary | ICD-10-CM

## 2018-06-18 LAB — POCT INR: INR: 2.9 (ref 2.0–3.0)

## 2018-06-18 NOTE — Patient Instructions (Signed)
Description   Take 1 tablet today Tuesday Aug 20, then continue with 1 tablet each Monday and Friday, 1.5 tablets all other days.  Repeat INR in 4 weeks.

## 2018-07-11 ENCOUNTER — Telehealth: Payer: Self-pay | Admitting: Oncology

## 2018-07-11 ENCOUNTER — Inpatient Hospital Stay: Payer: Medicare Other | Attending: Oncology

## 2018-07-11 DIAGNOSIS — C50911 Malignant neoplasm of unspecified site of right female breast: Secondary | ICD-10-CM

## 2018-07-11 DIAGNOSIS — C50921 Malignant neoplasm of unspecified site of right male breast: Secondary | ICD-10-CM

## 2018-07-11 DIAGNOSIS — C50912 Malignant neoplasm of unspecified site of left female breast: Secondary | ICD-10-CM

## 2018-07-11 DIAGNOSIS — Z1501 Genetic susceptibility to malignant neoplasm of breast: Secondary | ICD-10-CM | POA: Diagnosis not present

## 2018-07-11 DIAGNOSIS — R978 Other abnormal tumor markers: Secondary | ICD-10-CM | POA: Diagnosis not present

## 2018-07-11 DIAGNOSIS — Z7901 Long term (current) use of anticoagulants: Secondary | ICD-10-CM | POA: Insufficient documentation

## 2018-07-11 DIAGNOSIS — Z853 Personal history of malignant neoplasm of breast: Secondary | ICD-10-CM | POA: Insufficient documentation

## 2018-07-11 DIAGNOSIS — I4891 Unspecified atrial fibrillation: Secondary | ICD-10-CM | POA: Diagnosis not present

## 2018-07-11 DIAGNOSIS — Z8673 Personal history of transient ischemic attack (TIA), and cerebral infarction without residual deficits: Secondary | ICD-10-CM | POA: Diagnosis not present

## 2018-07-11 LAB — COMPREHENSIVE METABOLIC PANEL
ALBUMIN: 3.9 g/dL (ref 3.5–5.0)
ALK PHOS: 63 U/L (ref 38–126)
ALT: 19 U/L (ref 0–44)
AST: 31 U/L (ref 15–41)
Anion gap: 7 (ref 5–15)
BILIRUBIN TOTAL: 0.8 mg/dL (ref 0.3–1.2)
BUN: 13 mg/dL (ref 8–23)
CO2: 27 mmol/L (ref 22–32)
Calcium: 9.8 mg/dL (ref 8.9–10.3)
Chloride: 106 mmol/L (ref 98–111)
Creatinine, Ser: 0.82 mg/dL (ref 0.44–1.00)
GFR calc Af Amer: >60 mL/min (ref >60)
GFR calc non Af Amer: >60 mL/min (ref >60)
GLUCOSE: 106 mg/dL — AB (ref 70–99)
Potassium: 4.1 mmol/L (ref 3.5–5.1)
SODIUM: 140 mmol/L (ref 135–145)
TOTAL PROTEIN: 7.2 g/dL (ref 6.5–8.1)

## 2018-07-11 LAB — CBC WITH DIFFERENTIAL/PLATELET
BASOS ABS: 0 10*3/uL (ref 0.0–0.1)
Basophils Relative: 0 %
EOS PCT: 3 %
Eosinophils Absolute: 0.1 10*3/uL (ref 0.0–0.5)
HEMATOCRIT: 35.5 % (ref 34.8–46.6)
Hemoglobin: 11.9 g/dL (ref 11.6–15.9)
Lymphocytes Relative: 42 %
Lymphs Abs: 2 10*3/uL (ref 0.9–3.3)
MCH: 27 pg (ref 25.1–34.0)
MCHC: 33.5 g/dL (ref 31.5–36.0)
MCV: 80.5 fL (ref 79.5–101.0)
MONO ABS: 0.6 10*3/uL (ref 0.1–0.9)
MONOS PCT: 11 %
Neutro Abs: 2.1 10*3/uL (ref 1.5–6.5)
Neutrophils Relative %: 44 %
PLATELETS: 202 10*3/uL (ref 145–400)
RBC: 4.41 MIL/uL (ref 3.70–5.45)
RDW: 15.6 % — AB (ref 11.2–14.5)
WBC: 4.8 10*3/uL (ref 3.9–10.3)

## 2018-07-11 LAB — FERRITIN: Ferritin: 422 ng/mL — ABNORMAL HIGH (ref 11–307)

## 2018-07-11 NOTE — Telephone Encounter (Signed)
GM PAL 9/19 - moved appointment 10/7 with LC per GM. Left message scheduled mailed. Other appointments remain the same.

## 2018-07-12 LAB — CANCER ANTIGEN 27.29: CAN 27.29: 21.4 U/mL (ref 0.0–38.6)

## 2018-07-15 ENCOUNTER — Ambulatory Visit (INDEPENDENT_AMBULATORY_CARE_PROVIDER_SITE_OTHER): Payer: Medicare Other

## 2018-07-15 DIAGNOSIS — Z95810 Presence of automatic (implantable) cardiac defibrillator: Secondary | ICD-10-CM | POA: Diagnosis not present

## 2018-07-15 DIAGNOSIS — I5032 Chronic diastolic (congestive) heart failure: Secondary | ICD-10-CM | POA: Diagnosis not present

## 2018-07-16 ENCOUNTER — Ambulatory Visit (INDEPENDENT_AMBULATORY_CARE_PROVIDER_SITE_OTHER): Payer: Medicare Other | Admitting: Pharmacist

## 2018-07-16 DIAGNOSIS — I480 Paroxysmal atrial fibrillation: Secondary | ICD-10-CM | POA: Diagnosis not present

## 2018-07-16 DIAGNOSIS — I639 Cerebral infarction, unspecified: Secondary | ICD-10-CM

## 2018-07-16 DIAGNOSIS — Z7901 Long term (current) use of anticoagulants: Secondary | ICD-10-CM

## 2018-07-16 LAB — POCT INR: INR: 2.3 (ref 2.0–3.0)

## 2018-07-16 NOTE — Progress Notes (Signed)
EPIC Encounter for ICM Monitoring  Patient Name: Danielle Mckinney is a 67 y.o. female Date: 07/16/2018 Primary Care Physican: Maurice Small, MD Primary Cardiologist: Croitoru Electrophysiologist: Lovena Le Dry Weight:174-179lbs  Bi-V Pacing: >99%      Heart Failure questions reviewed, pt asymptomatic.   Thoracic impedance normal.  Prescribed: Furosemide 40 mg 1 tablet as needed and Potassium 20 mEq 1 tablet to be taken when taking Furosemide.  Recommendations: No changes.  Encouraged to call for fluid symptoms.  Follow-up plan: ICM clinic phone appointment on 08/15/2018.    Copy of ICM check sent to Dr. Lovena Le.   3 month ICM trend: 07/15/2018    1 Year ICM trend:       [name], RN 07/16/2018 5:03 PM

## 2018-07-18 ENCOUNTER — Ambulatory Visit: Payer: Medicare Other | Admitting: Oncology

## 2018-07-29 ENCOUNTER — Telehealth: Payer: Self-pay | Admitting: Cardiology

## 2018-07-29 ENCOUNTER — Ambulatory Visit (INDEPENDENT_AMBULATORY_CARE_PROVIDER_SITE_OTHER): Payer: Medicare Other | Admitting: *Deleted

## 2018-07-29 DIAGNOSIS — I480 Paroxysmal atrial fibrillation: Secondary | ICD-10-CM

## 2018-07-29 NOTE — Telephone Encounter (Signed)
Spoke with pt and reminded pt of remote transmission that is due today. Pt verbalized understanding.   

## 2018-07-30 NOTE — Progress Notes (Signed)
Remote pacemaker transmission.   

## 2018-08-05 ENCOUNTER — Encounter: Payer: Self-pay | Admitting: Adult Health

## 2018-08-05 ENCOUNTER — Inpatient Hospital Stay: Payer: Medicare Other | Attending: Oncology | Admitting: Adult Health

## 2018-08-05 ENCOUNTER — Telehealth: Payer: Self-pay | Admitting: Adult Health

## 2018-08-05 ENCOUNTER — Ambulatory Visit (HOSPITAL_COMMUNITY)
Admission: RE | Admit: 2018-08-05 | Discharge: 2018-08-05 | Disposition: A | Discharge status: Home / Self Care | Payer: Medicare Other | Source: Ambulatory Visit | Attending: Adult Health | Admitting: Adult Health

## 2018-08-05 VITALS — BP 128/57 | HR 70 | Temp 97.7°F | Resp 18 | Ht 62.0 in | Wt 183.3 lb

## 2018-08-05 DIAGNOSIS — Z1501 Genetic susceptibility to malignant neoplasm of breast: Secondary | ICD-10-CM | POA: Insufficient documentation

## 2018-08-05 DIAGNOSIS — I110 Hypertensive heart disease with heart failure: Secondary | ICD-10-CM | POA: Diagnosis not present

## 2018-08-05 DIAGNOSIS — Z9013 Acquired absence of bilateral breasts and nipples: Secondary | ICD-10-CM | POA: Diagnosis not present

## 2018-08-05 DIAGNOSIS — I429 Cardiomyopathy, unspecified: Secondary | ICD-10-CM | POA: Diagnosis not present

## 2018-08-05 DIAGNOSIS — I5032 Chronic diastolic (congestive) heart failure: Secondary | ICD-10-CM | POA: Diagnosis not present

## 2018-08-05 DIAGNOSIS — R21 Rash and other nonspecific skin eruption: Secondary | ICD-10-CM

## 2018-08-05 DIAGNOSIS — C50912 Malignant neoplasm of unspecified site of left female breast: Secondary | ICD-10-CM

## 2018-08-05 DIAGNOSIS — Z90722 Acquired absence of ovaries, bilateral: Secondary | ICD-10-CM | POA: Insufficient documentation

## 2018-08-05 DIAGNOSIS — Z79899 Other long term (current) drug therapy: Secondary | ICD-10-CM | POA: Insufficient documentation

## 2018-08-05 DIAGNOSIS — Z8673 Personal history of transient ischemic attack (TIA), and cerebral infarction without residual deficits: Secondary | ICD-10-CM | POA: Insufficient documentation

## 2018-08-05 DIAGNOSIS — Z7901 Long term (current) use of anticoagulants: Secondary | ICD-10-CM | POA: Insufficient documentation

## 2018-08-05 DIAGNOSIS — Z90710 Acquired absence of both cervix and uterus: Secondary | ICD-10-CM | POA: Insufficient documentation

## 2018-08-05 DIAGNOSIS — Z15060 Genetic susceptibility to colorectal cancer: Secondary | ICD-10-CM

## 2018-08-05 DIAGNOSIS — I4891 Unspecified atrial fibrillation: Secondary | ICD-10-CM | POA: Insufficient documentation

## 2018-08-05 DIAGNOSIS — M7989 Other specified soft tissue disorders: Secondary | ICD-10-CM

## 2018-08-05 DIAGNOSIS — Z853 Personal history of malignant neoplasm of breast: Secondary | ICD-10-CM | POA: Insufficient documentation

## 2018-08-05 DIAGNOSIS — D563 Thalassemia minor: Secondary | ICD-10-CM | POA: Insufficient documentation

## 2018-08-05 DIAGNOSIS — Z803 Family history of malignant neoplasm of breast: Secondary | ICD-10-CM | POA: Insufficient documentation

## 2018-08-05 MED ORDER — TRIAMCINOLONE ACETONIDE 0.025 % EX OINT: 1.0000 "application " | TOPICAL_OINTMENT | Freq: Two times a day (BID) | CUTANEOUS | 0 refills | Status: DC

## 2018-08-05 NOTE — Telephone Encounter (Signed)
Gave pt avs and calendar  °

## 2018-08-05 NOTE — Progress Notes (Signed)
[facility]  [phone] [phone]) [phone]     ID: Danielle Mckinney DOB: 1951-09-30  MR#: [id number]  VQQ#:[id number]  Patient Care Team: Maurice Small, MD as PCP - General (Family Medicine) Chauncey Cruel, MD as Consulting Physician (Oncology) Sanda Klein, MD as Consulting Physician (Cardiology) Terrance Mass, MD as Consulting Physician (Gynecology) PCP: Jonathon Bellows, MD SU:  OTHER MD:  CHIEF COMPLAINT:  BRCA positive  Breast cancer  CURRENT TREATMENT:  Observation  INTERVAL HISTORY: Danielle Mckinney returns today for follow-up of her BRCA related breast cancer.   REVIEW OF SYSTEMS: Danielle Mckinney is doing moderately well.  She does note some fatigue.  Danielle Mckinney sees Danielle Mckinney, her PCP regularly.  Danielle Mckinney is up to date with colon cancer screening.  She sees her gyn in one week for evaluation. She has not undergone skin cancer screening.  Danielle Mckinney notes a rash on her neck.  It has been present for one week, and Danielle Mckinney cannot see it.  She says it is itching. She notes it is irritating.  She also wants me to look at a couple of her moles.  She wants a chest xray today.    She is exercising regularly.  She notes some left arm swelling that has been slowly worsening.  She says she never really brought it up, because she thought it was related to exercise.  She has a pacemaker that is located on the left side.   Danielle Mckinney denies any unusual headaches, or vision changes.  She is without any dysphagia, cough, shortness of breath, palpitations, or chest pain.  She denies any bowel/bladder changes or nausea vomiting.  A detailed ROS was otherwise non contributory.    BREAST CANCER HISTORY: From the earlier summary note:   Danielle Mckinney is referred back by her primary care physician, Danielle Mckinney, because of a rise in her breast cancer tumor marker. To recap : Danielle Mckinney is status post bilateral breast cancers, not synchronous, and bilateral mastectomies with reconstruction. She had stage IV disease , and was  treated aggressively with high-dose chemotherapy and I'll ago his stem cell transplant support at [facility] in 1998. She has remained disease-free since that time , but did have a significant cardiomyopathy, which now appears to have largely resolved. She also had a right body stroke , with minimal residuals.   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 One of her 3 sisters did die from breast cancer at an early age and one of the patient's 2 daughters has been found to carry the mutation.  Her breast cancer history is given in more detail below    PAST MEDICAL HISTORY:  Past Medical History:  Diagnosis Date  . Atrial fibrillation ([place])   . Automatic implantable cardioverter-defibrillator in situ 08/25/2009   Qualifier: Diagnosis of  By: Lovena Le, MD, [facility], Binnie Kand    . Biventricular ICD (implantable cardioverter-defibrillator) in place    St.Jude  . Breast cancer ([place])   . Cancer (HCC)    Breast cancer-BRACA I gene  . CHF 08/18/2009   Qualifier: Diagnosis of  By: Lynden Ang    . CHF (congestive heart failure) ([place])   . Chronic diastolic CHF (congestive heart failure) ([place]) 04/23/2012  . CIN I (cervical intraepithelial neoplasia I)   . CVA (cerebral vascular accident) ([place]) 01/15/2013  . Essential hypertension 08/18/2009   Qualifier: Diagnosis of  By: Royetta Crochet,  Karen    . Fibroid   . Fibroid uterus 04/15/2013  . 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  The report date is January 21, 1998.   [name] Kitchen HTN (hypertension)   . Long term current use of anticoagulant therapy 01/15/2013  . Nonischemic cardiomyopathy ([place])    related to anthracycline chemotherapy  . Stroke [facility])     PAST SURGICAL HISTORY:  Past Surgical History:  Procedure Laterality Date  . BIV ICD GENERTAOR CHANGE OUT  12/20/2011   St.Jude  . BIV  ICD GENERTAOR CHANGE OUT N/A 12/20/2011   Procedure: BIV ICD GENERTAOR CHANGE OUT;  Surgeon: Evans Lance, MD;  Location: [facility] CATH LAB;  Service: Cardiovascular;  Laterality: N/A;  . BREAST SURGERY     Bilateral mastctomy and reconstruction TRAM  . CARDIAC DEFIBRILLATOR PLACEMENT    . CERVICAL CONE BIOPSY    . COLPOSCOPY    . GYNECOLOGIC CRYOSURGERY    . MASTECTOMY    . OOPHORECTOMY     BSO  . TRANSTHORACIC ECHOCARDIOGRAM  08/14/06  . TUBAL LIGATION    . US ECHOCARDIOGRAPHY  06/25/2012   borderline concentric LVH,LV systolic fx mildly reduced,impaired LV relaxation    FAMILY HISTORY Family History  Problem Relation Age of Onset  . Breast cancer Sister        Age 6  . Hypertension Sister   . Heart disease Maternal Grandfather   . Clotting disorder Mother   . Cirrhosis Father   . Coronary artery disease Other   . Hypertension Brother    The patient's father died in his late 62s from cirrhosis of the liver. The patient's mother died at the age of 51 secondary to a blood clot. [name] had 3 brothers, 3 sisters. One sister died from breast cancer. But [name] does not recall the exact age of diagnosis. One paternal aunt died with breast and ovarian cancer. The patient is known to be BRCA positive as is one of her 2 daughters  GYNECOLOGIC HISTORY:  No LMP recorded. Patient is postmenopausal.  menarche age 63, first live birth age 36 as she is [name] P2. She had a total abdominal hysterectomy with bilateral salpingo-oophorectomy  SOCIAL HISTORY:   she is divorced and lives alone, with no pets. Her daughter [name] recently moved back to town (September 2018). and younger daughter [name] live in [place], where she works as an Therapist, sports.. The patient has 3 grandchildren. One granddaughter is an Therapist, sports in [place]:  In place; her daughter [name] is her healthcare power of attorney   HEALTH MAINTENANCE: Social History  Substance Use Topics  . Smoking status: Never Smoker  .  Smokeless tobacco: Never Used  . Alcohol use No     Colonoscopy: 2010/[facility]  PAP: July 2016  Bone density: 05/27/2015/normal  Lipid panel: Allergies  Allergen Reactions  . Codeine Nausea And Vomiting  . Lisinopril Cough  . Oxycodone Nausea And Vomiting  . Penicillins Nausea And Vomiting  . Sulfa Antibiotics Nausea And Vomiting    Current Meds  Medication Sig  . Calcium-Magnesium-Vitamin D (CALCIUM 1200+D3 PO) Take by mouth.  . carvedilol (COREG) 6.25 MG tablet take 1 and 1/2 tablets by mouth twice a day with meals  . Cholecalciferol (D3-1000) 1000 UNITS tablet Take 1,000 Units by mouth daily.  . furosemide (LASIX) 40 MG tablet take 1 tablet by mouth once daily if needed for SWELLING  . Krill Oil  500 MG CAPS Take 500 mg by mouth daily.  [name] Kitchen losartan (COZAAR) 100 MG tablet Take 0.5 tablets (50 mg total) by mouth 2 (two) times daily.  . potassium chloride SA (K-DUR,KLOR-CON) 20 MEQ tablet take 1 tablet by mouth once daily if needed **TAKE ALONG WITH FUROSEMIDE WHEN NEEDED**  . spironolactone (ALDACTONE) 25 MG tablet take 1 tablet by mouth once daily  . warfarin (COUMADIN) 4 MG tablet TAKE 1 TO 1 AND 1/2 TABLETS BY MOUTH AS DIRECTED BY COUMADIN CLINIC   Current Outpatient Medications  Medication Sig Dispense Refill  . Calcium-Magnesium-Vitamin D (CALCIUM 1200+D3 PO) Take by mouth.    . carvedilol (COREG) 6.25 MG tablet take 1 and 1/2 tablets by mouth twice a day with meals 270 tablet 3  . Cholecalciferol (D3-1000) 1000 UNITS tablet Take 1,000 Units by mouth daily.    . furosemide (LASIX) 40 MG tablet take 1 tablet by mouth once daily if needed for SWELLING 90 tablet 3  . Krill Oil 500 MG CAPS Take 500 mg by mouth daily.    [name] Kitchen losartan (COZAAR) 100 MG tablet Take 0.5 tablets (50 mg total) by mouth 2 (two) times daily. 90 tablet 2  . potassium chloride SA (K-DUR,KLOR-CON) 20 MEQ tablet take 1 tablet by mouth once daily if needed **TAKE ALONG WITH FUROSEMIDE WHEN NEEDED** 30 tablet 11  .  spironolactone (ALDACTONE) 25 MG tablet take 1 tablet by mouth once daily 30 tablet 9  . warfarin (COUMADIN) 4 MG tablet TAKE 1 TO 1 AND 1/2 TABLETS BY MOUTH AS DIRECTED BY COUMADIN CLINIC 135 tablet 1  . triamcinolone (KENALOG) 0.025 % ointment Apply 1 application topically 2 (two) times daily. 30 g 0   No current facility-administered medications for this visit.    OBJECTIVE:   Vitals:   08/05/18 1519  BP: (!) 128/57  Pulse: 70  Resp: 18  Temp: 97.7 F (36.5 C)  TempSrc: Oral  SpO2: 98%  Weight: 183 lb 4.8 oz (83.1 kg)  Height: '5\' 2"'$  (1.575 m)    Body mass index is 33.53 kg/m.  Wt Readings from Last 3 Encounters:  08/05/18 183 lb 4.8 oz (83.1 kg)  06/14/18 184 lb (83.5 kg)  08/27/17 175 lb (79.4 kg)   GENERAL: Patient is a well appearing female in no acute distress HEENT:  Sclerae anicteric.  Oropharynx clear and moist. No ulcerations or evidence of oropharyngeal candidiasis. Neck is supple.  NODES:  No cervical, supraclavicular, or axillary lymphadenopathy palpated.  BREAST EXAM:  S/p bilateral mastectomies and reconstruction, no sign of recurrence.   LUNGS:  Clear to auscultation bilaterally.  No wheezes or rhonchi. HEART:  Regular rate and rhythm. No murmur appreciated. ABDOMEN:  Soft, nontender.  Positive, normoactive bowel sounds. No organomegaly palpated. MSK:  No focal spinal tenderness to palpation. Full range of motion bilaterally in the upper extremities. EXTREMITIES:  + left arm swelling  SKIN:  Cyst on left forearm, elevated nevus on chest wall, fine skin change ntoed on left neck, no definite rash however, no erythhema noted No nail dyscrasia. NEURO:  Nonfocal. Well oriented.  Appropriate affect.    LAB RESULTS:  CMP     Component Value Date/Time   NA 140 07/11/2018 1423   NA 139 07/12/2017 1445   K 4.1 07/11/2018 1423   K 4.2 07/12/2017 1445   CL 106 07/11/2018 1423   CO2 27 07/11/2018 1423   CO2 26 07/12/2017 1445   GLUCOSE 106 (H) 07/11/2018 1423    GLUCOSE 103  07/12/2017 1445   BUN 13 07/11/2018 1423   BUN 12.7 07/12/2017 1445   CREATININE 0.82 07/11/2018 1423   CREATININE 0.8 07/12/2017 1445   CALCIUM 9.8 07/11/2018 1423   CALCIUM 9.9 07/12/2017 1445   PROT 7.2 07/11/2018 1423   PROT 7.3 07/12/2017 1445   ALBUMIN 3.9 07/11/2018 1423   ALBUMIN 3.9 07/12/2017 1445   AST 31 07/11/2018 1423   AST 19 07/12/2017 1445   ALT 19 07/11/2018 1423   ALT 8 07/12/2017 1445   ALKPHOS 63 07/11/2018 1423   ALKPHOS 61 07/12/2017 1445   BILITOT 0.8 07/11/2018 1423   BILITOT 0.54 07/12/2017 1445   GFRNONAA >60 07/11/2018 1423   GFRAA >60 07/11/2018 1423    CBC    Component Value Date/Time   WBC 4.8 07/11/2018 1423   RBC 4.41 07/11/2018 1423   HGB 11.9 07/11/2018 1423   HGB 12.5 07/17/2017 1549   HGB 12.2 07/12/2017 1445   HCT 35.5 07/11/2018 1423   HCT 37.2 07/17/2017 1549   HCT 36.8 07/12/2017 1445   PLT 202 07/11/2018 1423   PLT 230 07/17/2017 1549   MCV 80.5 07/11/2018 1423   MCV 78 (L) 07/17/2017 1549   MCV 79.4 (L) 07/12/2017 1445   MCH 27.0 07/11/2018 1423   MCHC 33.5 07/11/2018 1423   RDW 15.6 (H) 07/11/2018 1423   RDW 16.0 (H) 07/17/2017 1549   RDW 15.9 (H) 07/12/2017 1445   LYMPHSABS 2.0 07/11/2018 1423   LYMPHSABS 2.4 07/17/2017 1549   LYMPHSABS 2.2 07/12/2017 1445   MONOABS 0.6 07/11/2018 1423   MONOABS 0.7 07/12/2017 1445   EOSABS 0.1 07/11/2018 1423   EOSABS 0.1 07/17/2017 1549   BASOSABS 0.0 07/11/2018 1423   BASOSABS 0.0 07/17/2017 1549   BASOSABS 0.0 07/12/2017 1445       Chemistry    CMP Latest Ref Rng & Units 07/11/2018 07/12/2017 07/12/2016  Glucose 70 - 99 mg/dL 106(H) 103 103(H)  BUN 8 - 23 mg/dL 13 12.7 21  Creatinine 0.44 - 1.00 mg/dL 0.82 0.8 0.84  Sodium 135 - 145 mmol/L 140 139 137  Potassium 3.5 - 5.1 mmol/L 4.1 4.2 4.1  Chloride 98 - 111 mmol/L 106 - 99  CO2 22 - 32 mmol/L '27 26 25  '$ Calcium 8.9 - 10.3 mg/dL 9.8 9.9 9.7  Total Protein 6.5 - 8.1 g/dL 7.2 7.3 7.7  Total Bilirubin 0.3 -  1.2 mg/dL 0.8 0.54 0.7  Alkaline Phos 38 - 126 U/L 63 61 66  AST 15 - 41 U/L '31 19 17  '$ ALT 0 - 44 U/L '19 8 9     '$ Urinalysis    Component Value Date/Time   COLORURINE YELLOW 08/08/2008 1859   APPEARANCEUR CLEAR 08/08/2008 1859   LABSPEC 1.012 08/08/2008 1859   PHURINE 6.0 08/08/2008 1859   GLUCOSEU NEGATIVE 08/08/2008 1859   HGBUR NEGATIVE 08/08/2008 1859   BILIRUBINUR NEGATIVE 08/08/2008 1859   KETONESUR NEGATIVE 08/08/2008 1859   PROTEINUR NEGATIVE 08/08/2008 1859   UROBILINOGEN 0.2 08/08/2008 1859   NITRITE NEGATIVE 08/08/2008 1859   LEUKOCYTESUR  08/08/2008 1859    NEGATIVE MICROSCOPIC NOT DONE ON URINES WITH NEGATIVE PROTEIN, BLOOD, LEUKOCYTES, NITRITE, OR GLUCOSE <1000 mg/dL.     STUDIES: Outside labs reviewed and particularly reviewed her CA-27-29, which not only remains in the normal range but is actually lower than before. We also reviewed her hepatitis C studies. She was antibody positive but there was no detectable hepatitis C RNA  ASSESSMENT: 67 y.o. BRCA mutation positive  thank you [facility] woman  (1) s/p Right lumpectomy in 1990 for a Stage II breast cancer treated with doxorubicin, cyclophosphamide and 5- fluorouracil, followed by radiation.  (2) Left lumpectomy in April of 1998 for a "Stage IV" tumor (she had left supraclavicular involvement which currently would be staged as III-C),  Estrogen and progesterone receptor negative, HER2 unknown  (a) treated with doxorubicin and paclitaxel followed by high-dose chemotherapy at [facility] with stem cell rescue  in December of 1998   (b) freceived radiation to the left supraclavicular, left axillary and left chest wall areas, completed March of 1999   (3) brca POSITIVE:  (a) urinary the patient's daughter [name] carries the mutation (on status post TAH-BSO 2000  (b)   (4) s/p bilateral mastectomies with flap reconstruction January 2000 with no evidence of residual disease.  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  (a)  on lifelong anticoagulation  (7) alpha thalassemia trait, with an MCV in the high 70s chronically   PLAN:  Keayra is doing well today.  She is without any clinical or radiographic sign of recurrence.  This is good news.  She did have several issues that I addressed today.  Skin rash/lesion concerns: prescribed Triamcinolone cream, recommended she have a full skin check with dermatology  Left arm swelling: This is likely lymphedema, however ordered doppler of the left arm to r/o dvt.  Breast cancer: No sign of recurrence, will get chest xray today at patient's request.    Health maintenance: I recommended continued f/u with her PCP and for her to stay up to date with colon cancer, skin cancer, and gyn cancer screenings.  I encouraged her to continue healthy diet and exercise.    She will return in one year for labs and f/u with Dr. Jana Hakim. She knows to call for any problems that may develop before that visit.  A total of (30) minutes of face-to-face time was spent with this patient with greater than 50% of that time in counseling and care-coordination.    Wilber Bihari, NP  08/05/18 3:47 PM Medical Oncology and Hematology [facility] [address] [place], [place] [address] Tel. [phone]    Fax. [phone]

## 2018-08-06 ENCOUNTER — Ambulatory Visit (HOSPITAL_COMMUNITY)
Admission: RE | Admit: 2018-08-06 | Discharge: 2018-08-06 | Disposition: A | Discharge status: Home / Self Care | Payer: Medicare Other | Source: Ambulatory Visit | Attending: Vascular Surgery | Admitting: Vascular Surgery

## 2018-08-06 DIAGNOSIS — M7989 Other specified soft tissue disorders: Secondary | ICD-10-CM | POA: Insufficient documentation

## 2018-08-06 NOTE — Progress Notes (Signed)
Left upper extremity venous duplex completed. Preliminary results - There is no evidence of a DVT or superficial thrombosis. Danielle Mckinney,RVS 08/06/2018 1:37 PM

## 2018-08-07 ENCOUNTER — Telehealth: Payer: Self-pay

## 2018-08-07 NOTE — Telephone Encounter (Signed)
-----   Message from Gardenia Phlegm, NP sent at 08/06/2018  4:03 PM EDT ----- Arm swelling is not related to DVT.  Am happy to send her to PT for evaluation for lymphedema if interested. ----- Message ----- From: Interface, [employer] One Seven Sent: 08/06/2018   3:34 PM EDT To: Gardenia Phlegm, NP

## 2018-08-07 NOTE — Telephone Encounter (Signed)
LVM for patient to call back to center so that nurse may give normal xray results and offer PT evaluation for lymphedema if pt interested.  Both numbers attempted in chart.

## 2018-08-08 ENCOUNTER — Other Ambulatory Visit: Payer: Self-pay | Admitting: Adult Health

## 2018-08-08 DIAGNOSIS — M7989 Other specified soft tissue disorders: Secondary | ICD-10-CM

## 2018-08-15 ENCOUNTER — Ambulatory Visit (INDEPENDENT_AMBULATORY_CARE_PROVIDER_SITE_OTHER): Payer: Medicare Other

## 2018-08-15 DIAGNOSIS — I5032 Chronic diastolic (congestive) heart failure: Secondary | ICD-10-CM

## 2018-08-15 DIAGNOSIS — Z95810 Presence of automatic (implantable) cardiac defibrillator: Secondary | ICD-10-CM

## 2018-08-16 NOTE — Progress Notes (Signed)
EPIC Encounter for ICM Monitoring  Patient Name: Danielle Mckinney is a 67 y.o. female Date: 08/16/2018 Primary Care Physican: Maurice Small, MD Primary Cardiologist: Croitoru Electrophysiologist: Lovena Le Dry Weight:180lbs  Bi-V Pacing: >99%        Heart Failure questions reviewed, pt symptomatic with swelling in hands but has resolved after taking PRN Furosemide for a few days.   Thoracic impedance normal.   Prescribed: Furosemide 40 mg 1 tablet as needed and Potassium 20 mEq 1 tablet to be taken when taking Furosemide.  Recommendations: No changes.    Encouraged to call for fluid symptoms.  Follow-up plan: ICM clinic phone appointment on 10/07/2018.   Office appointment scheduled 09/05/2018 with Dr. Sallyanne Kuster.    Copy of ICM check sent to Dr. Lovena Le.   3 month ICM trend: 08/15/2018    1 Year ICM trend:       [name], RN 08/16/2018 9:09 AM

## 2018-08-22 LAB — CUP PACEART REMOTE DEVICE CHECK
Battery Remaining Longevity: 19 mo
Battery Remaining Percentage: 22 %
Battery Voltage: 2.78 V
HIGH POWER IMPEDANCE MEASURED VALUE: 49 Ohm
Implantable Lead Implant Date: 20181119
Implantable Pulse Generator Implant Date: 20130220
Lead Channel Impedance Value: 450 Ohm
Lead Channel Impedance Value: 740 Ohm
Lead Channel Pacing Threshold Amplitude: 0.625 V
Lead Channel Pacing Threshold Amplitude: 0.75 V
Lead Channel Pacing Threshold Pulse Width: 0.8 ms
Lead Channel Sensing Intrinsic Amplitude: 5 mV
Lead Channel Setting Pacing Amplitude: 1.625
Lead Channel Setting Pacing Amplitude: 2 V
Lead Channel Setting Pacing Pulse Width: 0.8 ms
Lead Channel Setting Sensing Sensitivity: 0.5 mV
MDC IDC LEAD IMPLANT DT: 20181119
MDC IDC LEAD LOCATION: 753859
MDC IDC LEAD LOCATION: 753860
MDC IDC MSMT LEADCHNL RA IMPEDANCE VALUE: 310 Ohm
MDC IDC MSMT LEADCHNL RA PACING THRESHOLD PULSEWIDTH: 0.5 ms
MDC IDC MSMT LEADCHNL RV PACING THRESHOLD AMPLITUDE: 0.75 V
MDC IDC MSMT LEADCHNL RV PACING THRESHOLD PULSEWIDTH: 0.5 ms
MDC IDC MSMT LEADCHNL RV SENSING INTR AMPL: 11.3 mV
MDC IDC SESS DTM: 20190930145119
MDC IDC SET LEADCHNL LV PACING AMPLITUDE: 1.75 V
MDC IDC SET LEADCHNL RV PACING PULSEWIDTH: 0.5 ms
MDC IDC STAT BRADY AP VP PERCENT: 7.4 %
MDC IDC STAT BRADY AP VS PERCENT: 1 %
MDC IDC STAT BRADY AS VP PERCENT: 92 %
MDC IDC STAT BRADY AS VS PERCENT: 1 %
MDC IDC STAT BRADY RA PERCENT PACED: 7.4 %
Pulse Gen Serial Number: 7027976

## 2018-08-27 ENCOUNTER — Encounter: Payer: Self-pay | Admitting: Obstetrics & Gynecology

## 2018-08-27 ENCOUNTER — Ambulatory Visit (INDEPENDENT_AMBULATORY_CARE_PROVIDER_SITE_OTHER): Payer: Medicare Other | Admitting: Obstetrics & Gynecology

## 2018-08-27 VITALS — BP 132/70 | Ht 62.0 in | Wt 182.0 lb

## 2018-08-27 DIAGNOSIS — C50912 Malignant neoplasm of unspecified site of left female breast: Secondary | ICD-10-CM

## 2018-08-27 DIAGNOSIS — Z9889 Other specified postprocedural states: Secondary | ICD-10-CM | POA: Diagnosis not present

## 2018-08-27 DIAGNOSIS — Z1501 Genetic susceptibility to malignant neoplasm of breast: Secondary | ICD-10-CM

## 2018-08-27 DIAGNOSIS — Z780 Asymptomatic menopausal state: Secondary | ICD-10-CM

## 2018-08-27 DIAGNOSIS — Z01419 Encounter for gynecological examination (general) (routine) without abnormal findings: Secondary | ICD-10-CM

## 2018-08-27 DIAGNOSIS — Z15060 Genetic susceptibility to colorectal cancer: Secondary | ICD-10-CM

## 2018-08-27 NOTE — Progress Notes (Signed)
[place] 05-13-1951 [id number]   History:    67 y.o. G3P2A1L2 Divorced.  Single  RP:  Established patient presenting for annual gyn exam   HPI: Menopause.  No HRT.  No PMB.  No pelvic pain.  H/O LEEP in 2010.  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  S/P Bilateral Mastectomy/Reconstruction and Bilateral Oophorectomy.  H/O Heart Failure/Pace Maker.  H/O Stroke.  Exercising 5 times a week.  Mictions/BMs wnl.  Health labs with Fam MD.  Past medical history,surgical history, family history and social history were all reviewed and documented in the EPIC chart.  Gynecologic History No LMP recorded. Patient is postmenopausal. Contraception: post menopausal status Last Pap: 06/2017. Results were: Negative.  LEEP in 2010 Last mammogram: S/P Bilateral Mastectomy/Reconstruction.  DG Chest 2 view 08/05/2018 Stable/No active Cardiopulmonary disease Bone Density: 04/2015 Normal.  Will repeat at 5 years Colonoscopy: 9 yrs ago.  Will schedule Colono this coming year.  Obstetric History OB History  Gravida Para Term Preterm AB Living  '3 2 2   1 2  '$ SAB TAB Ectopic Multiple Live Births               # Outcome Date GA Lbr Len/2nd Weight Sex Delivery Anes PTL Lv  3 AB           2 Term           1 Term              ROS: A ROS was performed and pertinent positives and negatives are included in the history.  GENERAL: No fevers or chills. HEENT: No change in vision, no earache, sore throat or sinus congestion. NECK: No pain or stiffness. CARDIOVASCULAR: No chest pain or pressure. No palpitations. PULMONARY: No shortness of breath, cough or wheeze. GASTROINTESTINAL: No abdominal pain, nausea, vomiting or diarrhea, melena or bright red blood per rectum. GENITOURINARY: No urinary frequency, urgency, hesitancy or dysuria. MUSCULOSKELETAL: No joint or muscle pain, no back pain, no recent trauma. DERMATOLOGIC: No rash, no itching, no lesions. ENDOCRINE: No polyuria, polydipsia, no heat or  cold intolerance. No recent change in weight. HEMATOLOGICAL: No anemia or easy bruising or bleeding. NEUROLOGIC: No headache, seizures, numbness, tingling or weakness. PSYCHIATRIC: No depression, no loss of interest in normal activity or change in sleep pattern.     Exam:   BP 132/70   Ht '5\' 2"'$  (1.575 m)   Wt 182 lb (82.6 kg)   BMI 33.29 kg/m   Body mass index is 33.29 kg/m.  General appearance : Well developed well nourished female. No acute distress HEENT: Eyes: no retinal hemorrhage or exudates,  Neck supple, trachea midline, no carotid bruits, no thyroidmegaly Lungs: Clear to auscultation, no rhonchi or wheezes, or rib retractions  Heart: Regular rate and rhythm, no murmurs or gallops Breast:Examined in sitting and supine position were symmetrical in appearance status post bilateral mastectomy with reconstruction, no palpable masses or tenderness,  no skin retraction, no nipple inversion, no nipple discharge, no skin discoloration, no axillary or supraclavicular lymphadenopathy Abdomen: no palpable masses or tenderness, no rebound or guarding Extremities: no edema or skin discoloration or tenderness  Pelvic: Vulva: Normal             Vagina: No gross lesions or discharge  Cervix: No gross lesions or discharge.  Pap reflex done  Uterus  AV, normal size, shape and consistency, non-tender and mobile  Adnexa  Without masses or tenderness  Anus:  Normal   Assessment/Plan:  67 y.o. female for annual exam   1. Encounter for routine gynecological examination with Papanicolaou smear of cervix Normal gynecologic exam.  Last Pap test September 2018 was negative.  History of LEEP in 2010.  Pap reflex done today.  Breast exam status post bilateral mastectomy with reconstruction.  Doing chest x-rays annually, last one in October 2019 was stable with no active disease.  Will schedule colonoscopy this coming year.  Health labs with family physician.  2. Postmenopausal Postmenopausal, well on  no hormone replacement therapy.  No postmenopausal bleeding.  Last bone density in 2016 was normal.  Will repeat bone density at 5 years.  Vitamin D supplements, calcium intake of 1.5 g/day and regular weightbearing physical activity recommended.  3. H/O LEEP Pap reflex done today.  4. 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  Bilateral breast cancers.  Status post bilateral mastectomy with reconstruction.  Status post BSO.   Princess Bruins MD, 3:30 PM 08/27/2018

## 2018-08-28 ENCOUNTER — Ambulatory Visit (INDEPENDENT_AMBULATORY_CARE_PROVIDER_SITE_OTHER): Payer: Medicare Other | Admitting: Pharmacist Clinician (PhC)/ Clinical Pharmacy Specialist

## 2018-08-28 DIAGNOSIS — Z7901 Long term (current) use of anticoagulants: Secondary | ICD-10-CM

## 2018-08-28 DIAGNOSIS — I480 Paroxysmal atrial fibrillation: Secondary | ICD-10-CM

## 2018-08-28 DIAGNOSIS — I639 Cerebral infarction, unspecified: Secondary | ICD-10-CM

## 2018-08-28 LAB — PAP IG W/ RFLX HPV ASCU

## 2018-08-28 LAB — POCT INR: INR: 2.4 (ref 2.0–3.0)

## 2018-08-30 ENCOUNTER — Encounter: Payer: Self-pay | Admitting: Obstetrics & Gynecology

## 2018-08-30 NOTE — Patient Instructions (Signed)
1. Encounter for routine gynecological examination with Papanicolaou smear of cervix Normal gynecologic exam.  Last Pap test September 2018 was negative.  History of LEEP in 2010.  Pap reflex done today.  Breast exam status post bilateral mastectomy with reconstruction.  Doing chest x-rays annually, last one in October 2019 was stable with no active disease.  Will schedule colonoscopy this coming year.  Health labs with family physician.  2. Postmenopausal Postmenopausal, well on no hormone replacement therapy.  No postmenopausal bleeding.  Last bone density in 2016 was normal.  Will repeat bone density at 5 years.  Vitamin D supplements, calcium intake of 1.5 g/day and regular weightbearing physical activity recommended.  3. H/O LEEP Pap reflex done today.  4. 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  Bilateral breast cancers.  Status post bilateral mastectomy with reconstruction.  Status post BSO.  Danielle Mckinney, it was a pleasure seeing you today!  I will inform you of your results as soon as they are available.

## 2018-09-05 ENCOUNTER — Ambulatory Visit (INDEPENDENT_AMBULATORY_CARE_PROVIDER_SITE_OTHER): Payer: Medicare Other | Admitting: Cardiovascular Disease

## 2018-09-05 ENCOUNTER — Encounter: Payer: Self-pay | Admitting: Cardiovascular Disease

## 2018-09-05 ENCOUNTER — Other Ambulatory Visit: Payer: Self-pay | Admitting: Cardiovascular Disease

## 2018-09-05 VITALS — BP 132/66 | HR 69 | Ht 62.0 in | Wt 182.8 lb

## 2018-09-05 DIAGNOSIS — I480 Paroxysmal atrial fibrillation: Secondary | ICD-10-CM | POA: Diagnosis not present

## 2018-09-05 DIAGNOSIS — I5032 Chronic diastolic (congestive) heart failure: Secondary | ICD-10-CM | POA: Diagnosis not present

## 2018-09-05 DIAGNOSIS — Z95810 Presence of automatic (implantable) cardiac defibrillator: Secondary | ICD-10-CM

## 2018-09-05 DIAGNOSIS — I10 Essential (primary) hypertension: Secondary | ICD-10-CM

## 2018-09-05 DIAGNOSIS — E669 Obesity, unspecified: Secondary | ICD-10-CM

## 2018-09-05 DIAGNOSIS — Z7901 Long term (current) use of anticoagulants: Secondary | ICD-10-CM

## 2018-09-05 NOTE — Patient Instructions (Signed)
Medication Instructions:  Dr Sallyanne Kuster recommends that you continue on your current medications as directed. Please refer to the Current Medication list given to you today.  If you need a refill on your cardiac medications before your next appointment, please call your pharmacy.   Testing/Procedures: Remote monitoring is used to monitor your Pacemaker of ICD from home. This monitoring reduces the number of office visits required to check your device to one time per year. It allows [place] to keep an eye on the functioning of your device to ensure it is working properly. You are scheduled for a device check from home on Monday, December 9th, 2019. You may send your transmission at any time that day. If you have a wireless device, the transmission will be sent automatically. After your physician reviews your transmission, you will receive a postcard with your next transmission date.  Follow-Up: At [facility], you and your health needs are our priority.  As part of our continuing mission to provide you with exceptional heart care, we have created designated Provider Care Teams.  These Care Teams include your primary Cardiologist (physician) and Advanced Practice Providers (APPs -  Physician Assistants and Nurse Practitioners) who all work together to provide you with the care you need, when you need it. You will need a follow up appointment in 12 months.  Please call our office 2 months in advance to schedule this appointment.  You may see Sanda Klein, MD or one of the following Advanced Practice Providers on your designated Care Team: [place], [place] . Fabian Sharp, PA-C

## 2018-09-05 NOTE — Progress Notes (Signed)
Cardiology Office Note    Date:  09/07/2018   ID:  Danielle Mckinney, DOB 09-04-1951, MRN [id number]  PCP:  Maurice Small, MD  Cardiologist:  Cristopher Peru, M.D.; Danielle Klein, MD   Chief Complaint  Patient presents with  . Congestive Heart Failure    History of Present Illness:  Danielle Mckinney is a 67 y.o. 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  Remains very physically active and has good functional status without complaints of exertional dyspnea.  She does not have leg edema (engages in line dancing, water aerobics and silver sneakers 5 days a week).  She denies edema and takes her torsemide only occasionally (on the average 2 days every 2 weeks).  She denies palpitations, dizziness or syncope.  She has not had any new focal neurological complaints.  She has a [place] CRT-D device implanted in 2013.  Device check today shows estimated longevity of 1.40 units with generator.  She has virtually 100% biventricular pacing and only requires 8% atrial pacing.  There is been no episodes of atrial mode switch and more ventricular tachycardia.  Thoracic impedance is very stable.  VT therapies were disabled in June 2018 due to noise artifact seen on the ventricular channel (RV lead St. Jude Riata 2008). 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   Past Medical History:  Diagnosis Date  . Atrial fibrillation ([place])   . Automatic implantable cardioverter-defibrillator in situ 08/25/2009   Qualifier: Diagnosis of  By: Lovena Le, MD, [facility], Binnie Kand    . Biventricular ICD (implantable cardioverter-defibrillator) in place    St.Jude  . Breast cancer ([place])   . Cancer (H[facility])    Breast cancer-BRACA I gene  . CHF 08/18/2009   Qualifier: Diagnosis of  By:  Lynden Ang    . CHF (congestive heart failure) ([place])   . Chronic diastolic CHF (congestive heart failure) ([place]) 04/23/2012  . CIN I (cervical intraepithelial neoplasia I)   . CVA (cerebral vascular accident) ([place]) 01/15/2013  . Essential hypertension 08/18/2009   Qualifier: Diagnosis of  By: Lynden Ang    . Fibroid   . Fibroid uterus 04/15/2013  . Genetic testing 07/06/2015   BRCA1 c.191G>A (C64Y) pathogenic mutation found at [facility] at [facility].  The report date is January 21, 1998.   [name] Kitchen HTN (hypertension)   . Long term current use of anticoagulant therapy 01/15/2013  . Nonischemic cardiomyopathy ([place])    related to anthracycline chemotherapy  . Stroke [facility])     Past Surgical History:  Procedure Laterality Date  . BIV ICD GENERTAOR CHANGE OUT  12/20/2011   St.[employer]ude  . BIV ICD GENERTAOR CHANGE OUT N/A 12/20/2011   Procedure: BIV ICD GENERTAOR CHANGE OUT;  Surgeon: Evans Lance, MD;  Location: [facility] CATH LAB;  Service: Cardiovascular;  Laterality: N/A;  . BREAST SURGERY     Bilateral mastctomy and reconstruction TRAM  . CARDIAC DEFIBRILLATOR PLACEMENT    . CERVICAL CONE BIOPSY    . COLPOSCOPY    . GYNECOLOGIC CRYOSURGERY    . MASTECTOMY    . OOPHORECTOMY     BSO  . TRANSTHORACIC ECHOCARDIOGRAM  08/14/06  . TUBAL LIGATION    . US ECHOCARDIOGRAPHY  06/25/2012   borderline concentric LVH,LV systolic fx mildly reduced,impaired LV relaxation    Current Medications: Outpatient Medications Prior  to Visit  Medication Sig Dispense Refill  . Calcium-Magnesium-Vitamin D (CALCIUM 1200+D3 PO) Take by mouth.    . carvedilol (COREG) 6.25 MG tablet take 1 and 1/2 tablets by mouth twice a day with meals 270 tablet 3  . Cholecalciferol (D3-1000) 1000 UNITS tablet Take 1,000 Units by mouth daily.    . furosemide (LASIX) 40 MG tablet take 1 tablet by mouth once daily if needed for SWELLING 90 tablet 3  . Krill Oil 500 MG CAPS Take 500 mg by mouth daily.    [name] Kitchen losartan  (COZAAR) 100 MG tablet Take 0.5 tablets (50 mg total) by mouth 2 (two) times daily. 90 tablet 2  . potassium chloride SA (K-DUR,KLOR-CON) 20 MEQ tablet take 1 tablet by mouth once daily if needed **TAKE ALONG WITH FUROSEMIDE WHEN NEEDED** 30 tablet 11  . triamcinolone (KENALOG) 0.025 % ointment Apply 1 application topically 2 (two) times daily. 30 g 0  . warfarin (COUMADIN) 4 MG tablet TAKE 1 TO 1 AND 1/2 TABLETS BY MOUTH AS DIRECTED BY COUMADIN CLINIC 135 tablet 1  . spironolactone (ALDACTONE) 25 MG tablet take 1 tablet by mouth once daily 30 tablet 9   No facility-administered medications prior to visit.      Allergies:   Codeine; Lisinopril; Oxycodone; Penicillins; and Sulfa antibiotics   Social History   Socioeconomic History  . Marital status: Divorced    Spouse name: Not on file  . Number of children: Not on file  . Years of education: Not on file  . Highest education level: Not on file  Occupational History  . Not on file  Social Needs  . Financial resource strain: Not on file  . Food insecurity:    Worry: Not on file    Inability: Not on file  . Transportation needs:    Medical: Not on file    Non-medical: Not on file  Tobacco Use  . Smoking status: Never Smoker  . Smokeless tobacco: Never Used  Substance and Sexual Activity  . Alcohol use: No    Alcohol/week: 0.0 standard drinks  . Drug use: No  . Sexual activity: Yes    Birth control/protection: Surgical    Comment: interecourse age 52, sexual partners more than 5  Lifestyle  . Physical activity:    Days per week: Not on file    Minutes per session: Not on file  . Stress: Not on file  Relationships  . Social connections:    Talks on phone: Not on file    Gets together: Not on file    Attends religious service: Not on file    Active member of club or organization: Not on file    Attends meetings of clubs or organizations: Not on file    Relationship status: Not on file  Other Topics Concern  . Not on file   Social History Narrative  . Not on file     Family History:  The patient's family history includes Breast cancer in her sister; Cirrhosis in her father; Clotting disorder in her mother; Coronary artery disease in her other; Heart disease in her maternal grandfather; Hypertension in her brother and sister.   ROS:   Please see the history of present illness.    All other systems are reviewed and are negative  PHYSICAL EXAM:   VS:  BP 132/66   Pulse 69   Ht 5' 2" (1.575 m)   Wt 182 lb 12.8 oz (82.9 kg)   SpO2 91%   BMI  33.43 kg/m      General: Alert, oriented x3, no distress, obese.  Well-healed scars of the left subclavian ICD.  Some signs of collateral venous circulation. Head: no evidence of trauma, PERRL, EOMI, no exophtalmos or lid lag, no myxedema, no xanthelasma; normal ears, nose and oropharynx Neck: normal jugular venous pulsations and no hepatojugular reflux; brisk carotid pulses without delay and no carotid bruits Chest: clear to auscultation, no signs of consolidation by percussion or palpation, normal fremitus, symmetrical and full respiratory excursions Cardiovascular: normal position and quality of the apical impulse, regular rhythm, normal first and second heart sounds, no murmurs, rubs or gallops Abdomen: no tenderness or distention, no masses by palpation, no abnormal pulsatility or arterial bruits, normal bowel sounds, no hepatosplenomegaly Extremities: no clubbing, cyanosis or edema; 2+ radial, ulnar and brachial pulses bilaterally; 2+ right femoral, posterior tibial and dorsalis pedis pulses; 2+ left femoral, posterior tibial and dorsalis pedis pulses; no subclavian or femoral bruits Neurological: grossly nonfocal Psych: Normal mood and affect    Wt Readings from Last 3 Encounters:  09/05/18 182 lb 12.8 oz (82.9 kg)  08/27/18 182 lb (82.6 kg)  08/05/18 183 lb 4.8 oz (83.1 kg)      Studies/Labs Reviewed:   EKG:  EKG is ordered today.  Recent  Labs: 07/11/2018: ALT 19; BUN 13; Creatinine, Ser 0.82; Hemoglobin 11.9; Platelets 202; Potassium 4.1; Sodium 140    ASSESSMENT:    No diagnosis found.  PLAN:  In order of problems listed above:  1. CHF: Excellent functional status, only requires intermittent diuretics to maintain euvolemia.  On ARB and carvedilol. 2. CRT-P: Her device is programmed as a biventricular pacemaker, with tachycardia therapies turned off.  She has had recovery of left ventricular systolic function and has had noise artifact on her [place] Riata lead.  She has had excellent response to biventricular pacing.  It is quite possible that her left subclavian vein is occluded, so lead revision is associated with more risk than potential benefit.  Anticipate generator change out sometime in 2022 where the issue of RV lead function can be revisited (Dr. Lovena Le). 3. AFib: None in the last 12 months. CHADSVasc 5 (gender, hypertension, heart failure, previous stroke). 4. Warfarin: Note history of stroke.  Anticoagulation should not be interrupted lightly.  She would require "bridging".  Anticoagulation is well-tolerated, no bleeding complications. 5. HTN: Appropriate control. She did not tolerate higher doses of beta blocker in the past. 6. Obesity: Congratulated on her commitment to exercise.  Caloric restriction recommended weight loss.    Medication Adjustments/Labs and Tests Ordered: Current medicines are reviewed at length with the patient today.  Concerns regarding medicines are outlined above.  Medication changes, Labs and Tests ordered today are listed in the Patient Instructions below. Patient Instructions  Medication Instructions:  Dr Sallyanne Kuster recommends that you continue on your current medications as directed. Please refer to the Current Medication list given to you today.  If you need a refill on your cardiac medications before your next appointment, please call your pharmacy.   Testing/Procedures: Remote  monitoring is used to monitor your Pacemaker of ICD from home. This monitoring reduces the number of office visits required to check your device to one time per year. It allows [place] to keep an eye on the functioning of your device to ensure it is working properly. You are scheduled for a device check from home on Monday, December 9th, 2019. You may send your transmission at any time that day. If  you have a wireless device, the transmission will be sent automatically. After your physician reviews your transmission, you will receive a postcard with your next transmission date.  Follow-Up: At [facility], you and your health needs are our priority.  As part of our continuing mission to provide you with exceptional heart care, we have created designated Provider Care Teams.  These Care Teams include your primary Cardiologist (physician) and Advanced Practice Providers (APPs -  Physician Assistants and Nurse Practitioners) who all work together to provide you with the care you need, when you need it. You will need a follow up appointment in 12 months.  Please call our office 2 months in advance to schedule this appointment.  You may see Danielle Klein, MD or one of the following Advanced Practice Providers on your designated Care Team: [place], [place] . Fabian Sharp, PA-C    Signed, Danielle Klein, MD  09/07/2018 11:21 AM    [facility] [address], [place], [place]  [address] Phone: [phone]; Fax: [phone]

## 2018-09-06 NOTE — Telephone Encounter (Signed)
Rx request sent to pharmacy.  

## 2018-09-07 ENCOUNTER — Encounter: Payer: Self-pay | Admitting: Cardiovascular Disease

## 2018-09-13 LAB — CUP PACEART INCLINIC DEVICE CHECK
Date Time Interrogation Session: 20191115162751
Implantable Lead Implant Date: 20181119
Implantable Lead Location: 753859
Implantable Pulse Generator Implant Date: 20181119
MDC IDC LEAD IMPLANT DT: 20181119
MDC IDC LEAD LOCATION: 753860
MDC IDC PG SERIAL: 7027976
Pulse Gen Model: 2272

## 2018-10-07 ENCOUNTER — Ambulatory Visit (INDEPENDENT_AMBULATORY_CARE_PROVIDER_SITE_OTHER): Payer: Medicare Other

## 2018-10-07 DIAGNOSIS — Z95810 Presence of automatic (implantable) cardiac defibrillator: Secondary | ICD-10-CM

## 2018-10-07 DIAGNOSIS — I5032 Chronic diastolic (congestive) heart failure: Secondary | ICD-10-CM | POA: Diagnosis not present

## 2018-10-08 ENCOUNTER — Other Ambulatory Visit: Payer: Self-pay | Admitting: Cardiovascular Disease

## 2018-10-08 ENCOUNTER — Ambulatory Visit (INDEPENDENT_AMBULATORY_CARE_PROVIDER_SITE_OTHER): Payer: Medicare Other | Admitting: Pharmacist Clinician (PhC)/ Clinical Pharmacy Specialist

## 2018-10-08 DIAGNOSIS — I480 Paroxysmal atrial fibrillation: Secondary | ICD-10-CM

## 2018-10-08 DIAGNOSIS — Z7901 Long term (current) use of anticoagulants: Secondary | ICD-10-CM

## 2018-10-08 DIAGNOSIS — I639 Cerebral infarction, unspecified: Secondary | ICD-10-CM

## 2018-10-08 LAB — POCT INR: INR: 3.7 — AB (ref 2.0–3.0)

## 2018-10-08 NOTE — Patient Instructions (Signed)
Description   No warfarin today Tuesday Dec 10, then only 1 tablet on Wednesday Dec 11.  After that continue with 1 tablet each Monday and Friday, 1.5 tablets all other days.  Repeat INR in 3 weeks.

## 2018-10-08 NOTE — Progress Notes (Signed)
EPIC Encounter for ICM Monitoring  Patient Name: Danielle Mckinney is a 67 y.o. female Date: 10/08/2018 Primary Care Physican: Maurice Small, MD Primary Cardiologist: Croitoru Electrophysiologist: Lovena Le Last Weight:180lbs  Bi-V Pacing: >99%                                          Heart Failure questions reviewed, pt asymptomatic but did have some ankle swelling during decreased impedance during Thanksgiving holiday.  She took 3 days of PRN Furosemide with relief of ankle swelling.    Thoracic impedance normal.   Prescribed: Furosemide 40 mg 1 tablet as needed and Potassium 20 mEq 1 tablet to be taken when taking Furosemide.  Recommendations: No changes.    Encouraged to call for fluid symptoms.  Follow-up plan: ICM clinic phone appointment on 11/14/2018.    Copy of ICM check sent to Dr. Lovena Le.   3 month ICM trend: 10/07/2018    1 Year ICM trend:       [name], RN 10/08/2018 1:32 PM

## 2018-10-28 ENCOUNTER — Ambulatory Visit (INDEPENDENT_AMBULATORY_CARE_PROVIDER_SITE_OTHER): Payer: Medicare Other | Admitting: Pharmacist Clinician (PhC)/ Clinical Pharmacy Specialist

## 2018-10-28 ENCOUNTER — Telehealth: Payer: Self-pay

## 2018-10-28 ENCOUNTER — Ambulatory Visit (INDEPENDENT_AMBULATORY_CARE_PROVIDER_SITE_OTHER): Payer: Medicare Other

## 2018-10-28 DIAGNOSIS — I5032 Chronic diastolic (congestive) heart failure: Secondary | ICD-10-CM | POA: Diagnosis not present

## 2018-10-28 DIAGNOSIS — Z7901 Long term (current) use of anticoagulants: Secondary | ICD-10-CM | POA: Diagnosis not present

## 2018-10-28 DIAGNOSIS — I428 Other cardiomyopathies: Secondary | ICD-10-CM

## 2018-10-28 DIAGNOSIS — I480 Paroxysmal atrial fibrillation: Secondary | ICD-10-CM

## 2018-10-28 DIAGNOSIS — I639 Cerebral infarction, unspecified: Secondary | ICD-10-CM

## 2018-10-28 LAB — POCT INR: INR: 2 (ref 2.0–3.0)

## 2018-10-28 NOTE — Telephone Encounter (Signed)
Left message for patient to remind of missed remote transmission.  

## 2018-10-29 LAB — CUP PACEART REMOTE DEVICE CHECK
Brady Statistic AP VP Percent: 7.4 %
Brady Statistic AP VS Percent: 1 %
Brady Statistic AS VS Percent: 1 %
HighPow Impedance: 52 Ohm
Implantable Lead Implant Date: 20181119
Implantable Lead Location: 753860
Implantable Pulse Generator Implant Date: 20130220
Lead Channel Impedance Value: 730 Ohm
Lead Channel Pacing Threshold Pulse Width: 0.5 ms
Lead Channel Pacing Threshold Pulse Width: 0.8 ms
Lead Channel Sensing Intrinsic Amplitude: 11.3 mV
Lead Channel Setting Pacing Amplitude: 1.625
Lead Channel Setting Pacing Pulse Width: 0.8 ms
Lead Channel Setting Sensing Sensitivity: 0.5 mV
MDC IDC LEAD IMPLANT DT: 20181119
MDC IDC LEAD LOCATION: 753859
MDC IDC MSMT BATTERY REMAINING LONGEVITY: 17 mo
MDC IDC MSMT BATTERY REMAINING PERCENTAGE: 18 %
MDC IDC MSMT BATTERY VOLTAGE: 2.75 V
MDC IDC MSMT LEADCHNL LV PACING THRESHOLD AMPLITUDE: 0.75 V
MDC IDC MSMT LEADCHNL RA IMPEDANCE VALUE: 330 Ohm
MDC IDC MSMT LEADCHNL RA PACING THRESHOLD AMPLITUDE: 0.625 V
MDC IDC MSMT LEADCHNL RA PACING THRESHOLD PULSEWIDTH: 0.5 ms
MDC IDC MSMT LEADCHNL RA SENSING INTR AMPL: 5 mV
MDC IDC MSMT LEADCHNL RV IMPEDANCE VALUE: 490 Ohm
MDC IDC MSMT LEADCHNL RV PACING THRESHOLD AMPLITUDE: 0.75 V
MDC IDC SESS DTM: 20191231145607
MDC IDC SET LEADCHNL LV PACING AMPLITUDE: 1.75 V
MDC IDC SET LEADCHNL RV PACING AMPLITUDE: 2 V
MDC IDC SET LEADCHNL RV PACING PULSEWIDTH: 0.5 ms
MDC IDC STAT BRADY AS VP PERCENT: 91 %
MDC IDC STAT BRADY RA PERCENT PACED: 6.9 %
Pulse Gen Serial Number: 7027976

## 2018-10-29 NOTE — Progress Notes (Signed)
Remote pacemaker transmission.   

## 2018-11-01 ENCOUNTER — Ambulatory Visit (INDEPENDENT_AMBULATORY_CARE_PROVIDER_SITE_OTHER): Payer: Medicare Other

## 2018-11-01 DIAGNOSIS — I428 Other cardiomyopathies: Secondary | ICD-10-CM | POA: Diagnosis not present

## 2018-11-01 DIAGNOSIS — I5032 Chronic diastolic (congestive) heart failure: Secondary | ICD-10-CM

## 2018-11-03 LAB — CUP PACEART REMOTE DEVICE CHECK
Battery Remaining Longevity: 16 mo
Brady Statistic AP VP Percent: 7.3 %
Brady Statistic AP VS Percent: 1 %
Brady Statistic RA Percent Paced: 6.8 %
Date Time Interrogation Session: 20200103210409
HIGH POWER IMPEDANCE MEASURED VALUE: 48 Ohm
Implantable Lead Implant Date: 20181119
Implantable Lead Location: 753859
Lead Channel Impedance Value: 450 Ohm
Lead Channel Impedance Value: 730 Ohm
Lead Channel Pacing Threshold Amplitude: 0.5 V
Lead Channel Pacing Threshold Amplitude: 0.75 V
Lead Channel Pacing Threshold Pulse Width: 0.5 ms
Lead Channel Pacing Threshold Pulse Width: 0.8 ms
Lead Channel Sensing Intrinsic Amplitude: 11.3 mV
Lead Channel Setting Pacing Pulse Width: 0.8 ms
Lead Channel Setting Sensing Sensitivity: 0.5 mV
MDC IDC LEAD IMPLANT DT: 20181119
MDC IDC LEAD LOCATION: 753860
MDC IDC MSMT BATTERY REMAINING PERCENTAGE: 18 %
MDC IDC MSMT BATTERY VOLTAGE: 2.75 V
MDC IDC MSMT LEADCHNL RA IMPEDANCE VALUE: 310 Ohm
MDC IDC MSMT LEADCHNL RA PACING THRESHOLD PULSEWIDTH: 0.5 ms
MDC IDC MSMT LEADCHNL RA SENSING INTR AMPL: 5 mV
MDC IDC MSMT LEADCHNL RV PACING THRESHOLD AMPLITUDE: 0.75 V
MDC IDC PG IMPLANT DT: 20130220
MDC IDC SET LEADCHNL LV PACING AMPLITUDE: 1.75 V
MDC IDC SET LEADCHNL RA PACING AMPLITUDE: 1.5 V
MDC IDC SET LEADCHNL RV PACING AMPLITUDE: 2 V
MDC IDC SET LEADCHNL RV PACING PULSEWIDTH: 0.5 ms
MDC IDC STAT BRADY AS VP PERCENT: 91 %
MDC IDC STAT BRADY AS VS PERCENT: 1 %
Pulse Gen Serial Number: 7027976

## 2018-11-04 ENCOUNTER — Other Ambulatory Visit: Payer: Self-pay | Admitting: Cardiovascular Disease

## 2018-11-04 NOTE — Progress Notes (Signed)
Remote pacemaker transmission.   

## 2018-11-14 ENCOUNTER — Ambulatory Visit (INDEPENDENT_AMBULATORY_CARE_PROVIDER_SITE_OTHER): Payer: Medicare Other

## 2018-11-14 DIAGNOSIS — Z95810 Presence of automatic (implantable) cardiac defibrillator: Secondary | ICD-10-CM

## 2018-11-14 DIAGNOSIS — I5032 Chronic diastolic (congestive) heart failure: Secondary | ICD-10-CM | POA: Diagnosis not present

## 2018-11-15 ENCOUNTER — Telehealth: Payer: Self-pay

## 2018-11-15 ENCOUNTER — Other Ambulatory Visit: Payer: Self-pay | Admitting: Gastroenterology

## 2018-11-15 DIAGNOSIS — Z1211 Encounter for screening for malignant neoplasm of colon: Secondary | ICD-10-CM

## 2018-11-15 NOTE — Telephone Encounter (Signed)
Remote ICM transmission received.  Attempted call to patient regarding ICM remote transmission and left detailed message, per DPR, with next ICM remote transmission date of 12/16/2018.  Advised to return call for any fluid symptoms or questions.    

## 2018-11-15 NOTE — Progress Notes (Signed)
EPIC Encounter for ICM Monitoring  Patient Name: Danielle Mckinney is a 68 y.o. female Date: 11/15/2018 Primary Care Physican: Maurice Small, MD Primary Cardiologist: Croitoru Electrophysiologist: Lovena Le Last Weight:180lbs  Bi-V Pacing: 98%  Attempted call to patient.  Left detailed message per DPR regarding transmission. Transmission reviewed.    Thoracic impedance normal.   Prescribed:Furosemide 40 mg 1 tablet as needed and Potassium 20 mEq 1 tablet to be taken when taking Furosemide.  Recommendations:No changes. Encouraged to call for fluid symptoms.  Follow-up plan: ICM clinic phone appointment on2/17/2020.   Copy of ICM check sent to Dr.Taylor.   3 month ICM trend: 11/14/2018    1 Year ICM trend:       [name], RN 11/15/2018 3:34 PM

## 2018-11-26 ENCOUNTER — Ambulatory Visit (INDEPENDENT_AMBULATORY_CARE_PROVIDER_SITE_OTHER): Payer: Medicare Other | Admitting: Pharmacist

## 2018-11-26 DIAGNOSIS — I480 Paroxysmal atrial fibrillation: Secondary | ICD-10-CM

## 2018-11-26 DIAGNOSIS — Z7901 Long term (current) use of anticoagulants: Secondary | ICD-10-CM

## 2018-11-26 DIAGNOSIS — I639 Cerebral infarction, unspecified: Secondary | ICD-10-CM

## 2018-11-26 LAB — POCT INR: INR: 1.6 — AB (ref 2.0–3.0)

## 2018-12-05 ENCOUNTER — Other Ambulatory Visit: Payer: Self-pay | Admitting: Cardiovascular Disease

## 2018-12-07 ENCOUNTER — Other Ambulatory Visit: Payer: Self-pay | Admitting: Cardiovascular Disease

## 2018-12-16 ENCOUNTER — Telehealth: Payer: Self-pay

## 2018-12-16 ENCOUNTER — Ambulatory Visit (INDEPENDENT_AMBULATORY_CARE_PROVIDER_SITE_OTHER): Payer: Medicare Other

## 2018-12-16 DIAGNOSIS — Z95810 Presence of automatic (implantable) cardiac defibrillator: Secondary | ICD-10-CM | POA: Diagnosis not present

## 2018-12-16 DIAGNOSIS — I5032 Chronic diastolic (congestive) heart failure: Secondary | ICD-10-CM

## 2018-12-16 NOTE — Telephone Encounter (Signed)
Remote ICM transmission received.  Attempted call to patient regarding ICM remote transmission and left detailed message, per DPR, with next ICM remote transmission date of 01/20/2019.  Advised to return call for any fluid symptoms or questions.    

## 2018-12-16 NOTE — Progress Notes (Signed)
EPIC Encounter for ICM Monitoring  Patient Name: Danielle Mckinney is a 68 y.o. female Date: 12/16/2018 Primary Care Physican: Maurice Small, MD Primary Cardiologist: Croitoru Electrophysiologist: Lovena Le LastWeight:180lbs  Bi-V Pacing: 98%  Attempted call to patient.  Left detailed message per DPR regarding transmission. Transmission reviewed.   Thoracic impedance normal.   Prescribed:Furosemide 40 mg 1 tablet as needed and Potassium 20 mEq 1 tablet to be taken when taking Furosemide.  Recommendations:No changes. Encouraged to call for fluid symptoms.  Follow-up plan: ICM clinic phone appointment on3/23/2020.   Copy of ICM check sent to Dr.Taylor.   3 month ICM trend: 12/16/2018    1 Year ICM trend:       [name], RN 12/16/2018 3:34 PM

## 2018-12-17 ENCOUNTER — Ambulatory Visit (INDEPENDENT_AMBULATORY_CARE_PROVIDER_SITE_OTHER): Payer: Medicare Other | Admitting: Pharmacist Clinician (PhC)/ Clinical Pharmacy Specialist

## 2018-12-17 DIAGNOSIS — I480 Paroxysmal atrial fibrillation: Secondary | ICD-10-CM

## 2018-12-17 DIAGNOSIS — Z7901 Long term (current) use of anticoagulants: Secondary | ICD-10-CM

## 2018-12-17 DIAGNOSIS — I639 Cerebral infarction, unspecified: Secondary | ICD-10-CM

## 2018-12-17 LAB — POCT INR: INR: 2.1 (ref 2.0–3.0)

## 2018-12-17 NOTE — Patient Instructions (Signed)
Continue with 1 tablet each Monday and Friday, 1.5 tablets all other days.  DO NOT TAKE WARFARIN on Feb 27 and take only 1/2 tablet on Feb 28.  Repeat INR on March 4  weeks.

## 2018-12-27 ENCOUNTER — Ambulatory Visit
Admission: RE | Admit: 2018-12-27 | Discharge: 2018-12-27 | Disposition: A | Discharge status: Home / Self Care | Payer: Medicare Other | Source: Ambulatory Visit | Attending: Gastroenterology | Admitting: Gastroenterology

## 2018-12-27 DIAGNOSIS — Z1211 Encounter for screening for malignant neoplasm of colon: Secondary | ICD-10-CM

## 2019-01-01 ENCOUNTER — Ambulatory Visit (INDEPENDENT_AMBULATORY_CARE_PROVIDER_SITE_OTHER): Payer: Medicare Other | Admitting: Pharmacist Clinician (PhC)/ Clinical Pharmacy Specialist

## 2019-01-01 DIAGNOSIS — I480 Paroxysmal atrial fibrillation: Secondary | ICD-10-CM

## 2019-01-01 DIAGNOSIS — I639 Cerebral infarction, unspecified: Secondary | ICD-10-CM

## 2019-01-01 DIAGNOSIS — Z7901 Long term (current) use of anticoagulants: Secondary | ICD-10-CM

## 2019-01-01 LAB — POCT INR: INR: 2.3 (ref 2.0–3.0)

## 2019-01-20 ENCOUNTER — Telehealth: Payer: Self-pay

## 2019-01-20 ENCOUNTER — Other Ambulatory Visit: Payer: Self-pay

## 2019-01-20 ENCOUNTER — Ambulatory Visit (INDEPENDENT_AMBULATORY_CARE_PROVIDER_SITE_OTHER): Payer: Medicare Other

## 2019-01-20 DIAGNOSIS — I5032 Chronic diastolic (congestive) heart failure: Secondary | ICD-10-CM

## 2019-01-20 DIAGNOSIS — Z95810 Presence of automatic (implantable) cardiac defibrillator: Secondary | ICD-10-CM

## 2019-01-20 NOTE — Telephone Encounter (Signed)

## 2019-01-21 ENCOUNTER — Ambulatory Visit (INDEPENDENT_AMBULATORY_CARE_PROVIDER_SITE_OTHER): Payer: Medicare Other | Admitting: *Deleted

## 2019-01-21 ENCOUNTER — Other Ambulatory Visit: Payer: Self-pay

## 2019-01-21 DIAGNOSIS — I480 Paroxysmal atrial fibrillation: Secondary | ICD-10-CM | POA: Diagnosis not present

## 2019-01-21 DIAGNOSIS — I639 Cerebral infarction, unspecified: Secondary | ICD-10-CM | POA: Diagnosis not present

## 2019-01-21 DIAGNOSIS — Z7901 Long term (current) use of anticoagulants: Secondary | ICD-10-CM

## 2019-01-21 LAB — POCT INR: INR: 2.1 (ref 2.0–3.0)

## 2019-01-21 NOTE — Patient Instructions (Addendum)
Description   Continue taking 1.5 tablets daily except 1 tablet each Monday and Friday.  Recheck INR in 4 weeks.

## 2019-01-22 NOTE — Progress Notes (Signed)
EPIC Encounter for ICM Monitoring  Patient Name: Danielle Mckinney is a 68 y.o. female Date: 01/22/2019 Primary Care Physican: Maurice Small, MD Primary Cardiologist: Croitoru Electrophysiologist: Santina Evans Pacing: 98% LastWeight:180lbs  01/22/2019 Weight: 180 lbs   Heart failure questions reviewed. Pt asymptomatic.  Thoracic impedance normal.   Prescribed:Furosemide 40 mg 1 tablet as needed and Potassium 20 mEq 1 tablet to be taken when taking Furosemide.  Recommendations:No changes. Encouraged to call for fluid symptoms.  Follow-up plan: ICM clinic phone appointment on4/27/2020.   Copy of ICM check sent to Dr.Taylor.  3 month ICM trend: 01/20/2019    1 Year ICM trend:       [name], RN 01/22/2019 11:47 AM

## 2019-01-31 ENCOUNTER — Ambulatory Visit (INDEPENDENT_AMBULATORY_CARE_PROVIDER_SITE_OTHER): Payer: Medicare Other | Admitting: *Deleted

## 2019-01-31 ENCOUNTER — Other Ambulatory Visit: Payer: Self-pay

## 2019-01-31 DIAGNOSIS — I480 Paroxysmal atrial fibrillation: Secondary | ICD-10-CM | POA: Diagnosis not present

## 2019-01-31 DIAGNOSIS — I5032 Chronic diastolic (congestive) heart failure: Secondary | ICD-10-CM

## 2019-01-31 LAB — CUP PACEART REMOTE DEVICE CHECK
Battery Remaining Longevity: 11 mo
Battery Remaining Percentage: 11 %
Battery Voltage: 2.69 V
Brady Statistic AP VP Percent: 9 %
Brady Statistic AP VS Percent: 1 %
Brady Statistic AS VP Percent: 88 %
Brady Statistic AS VS Percent: 1 %
Brady Statistic RA Percent Paced: 7.4 %
Date Time Interrogation Session: 20200403143123
HighPow Impedance: 52 Ohm
Implantable Lead Implant Date: 20181119
Implantable Lead Implant Date: 20181119
Implantable Lead Location: 753859
Implantable Lead Location: 753860
Implantable Pulse Generator Implant Date: 20130220
Lead Channel Impedance Value: 330 Ohm
Lead Channel Impedance Value: 490 Ohm
Lead Channel Impedance Value: 760 Ohm
Lead Channel Pacing Threshold Amplitude: 0.625 V
Lead Channel Pacing Threshold Amplitude: 0.75 V
Lead Channel Pacing Threshold Amplitude: 0.75 V
Lead Channel Pacing Threshold Pulse Width: 0.5 ms
Lead Channel Pacing Threshold Pulse Width: 0.5 ms
Lead Channel Pacing Threshold Pulse Width: 0.8 ms
Lead Channel Sensing Intrinsic Amplitude: 11.3 mV
Lead Channel Sensing Intrinsic Amplitude: 5 mV
Lead Channel Setting Pacing Amplitude: 1.625
Lead Channel Setting Pacing Amplitude: 1.75 V
Lead Channel Setting Pacing Amplitude: 2 V
Lead Channel Setting Pacing Pulse Width: 0.5 ms
Lead Channel Setting Pacing Pulse Width: 0.8 ms
Lead Channel Setting Sensing Sensitivity: 0.5 mV
Pulse Gen Serial Number: 7027976

## 2019-02-06 NOTE — Progress Notes (Signed)
Remote pacemaker transmission.   

## 2019-02-07 IMAGING — CR DG CHEST 2V
2 series · 2 of 2 positions shown · non-contrast
Comparison: 07/19/2017

CLINICAL DATA: Left arm swelling. Atrial fibrillation. Personal
history of left breast carcinoma.

EXAM:
CHEST - 2 VIEW

[w chest pa]
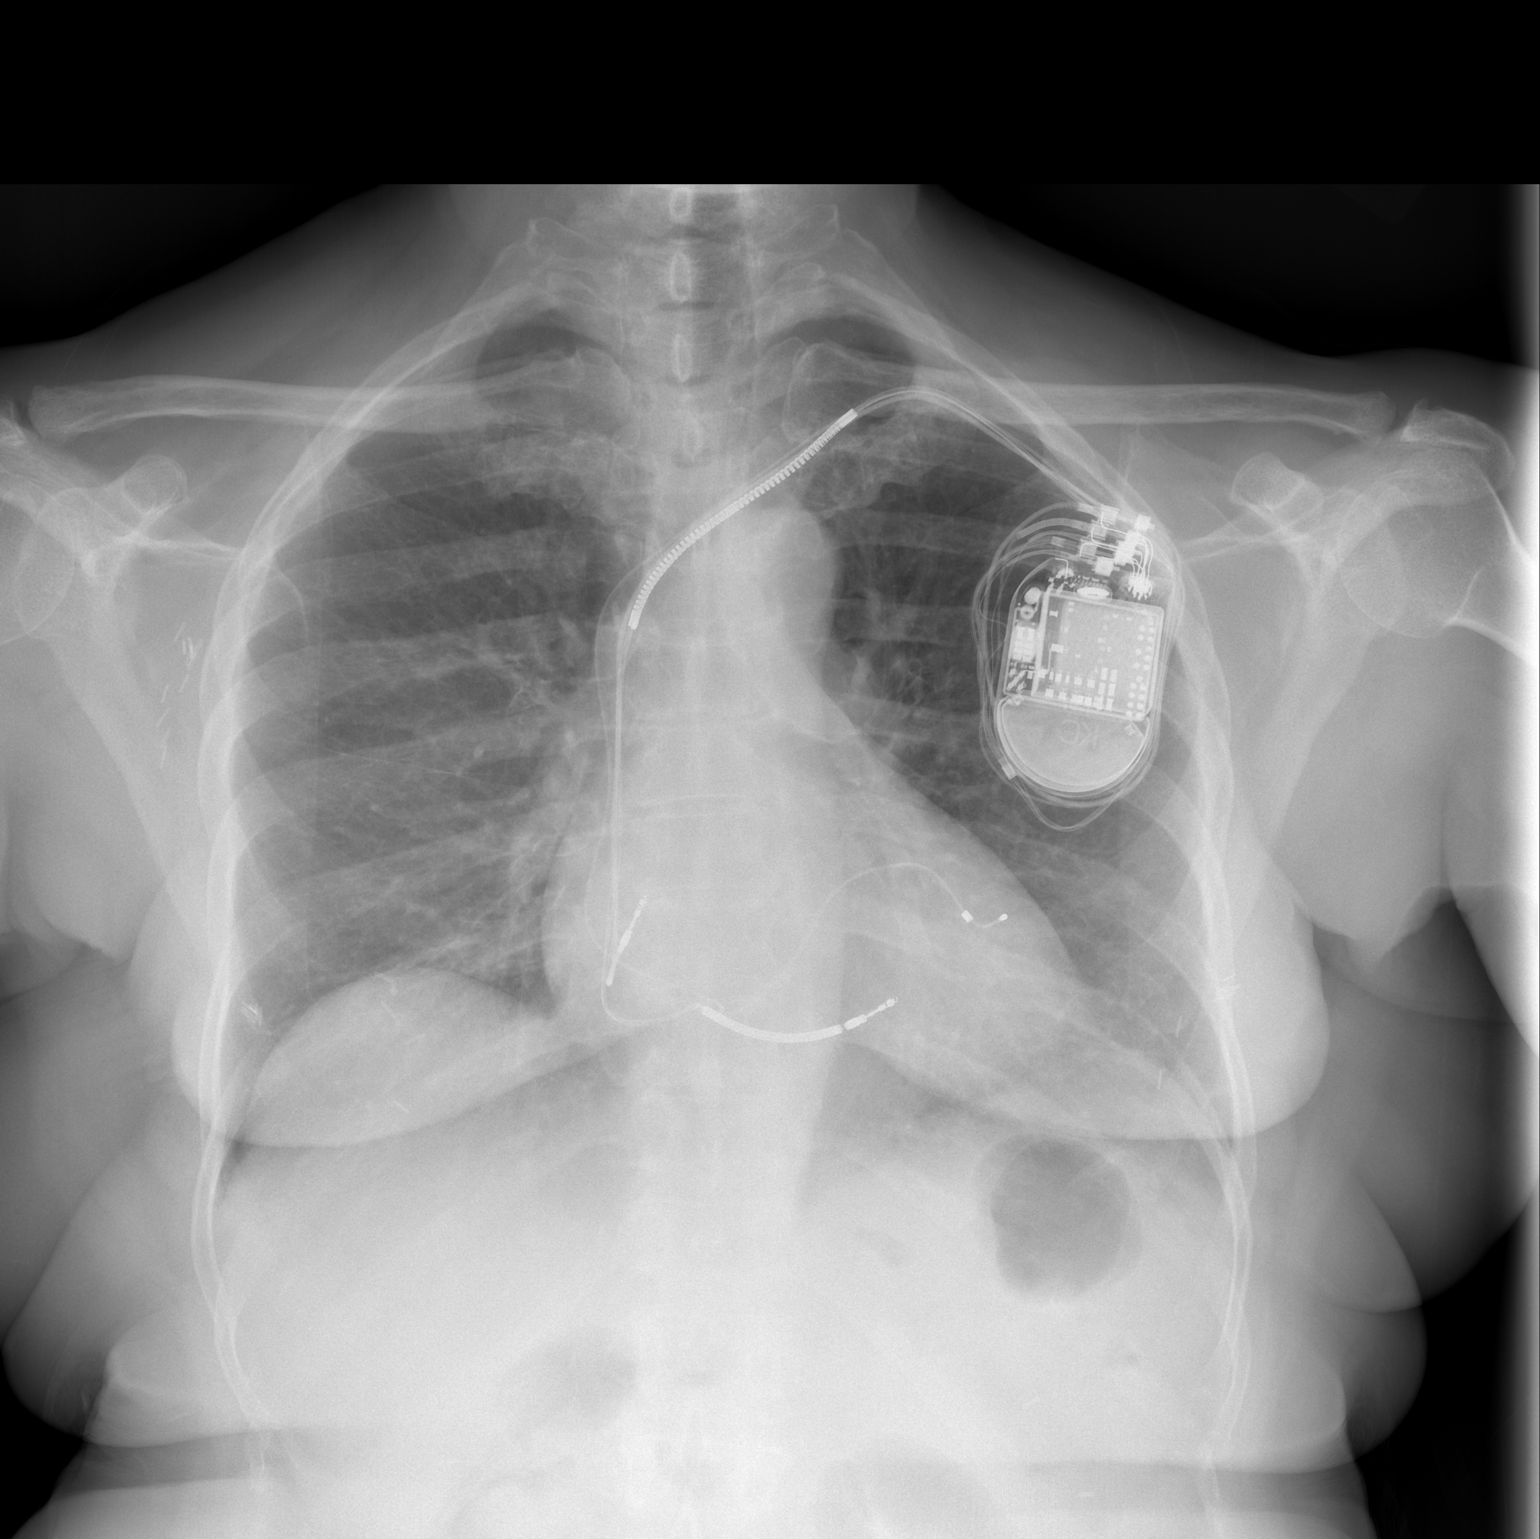

[w chest lat]
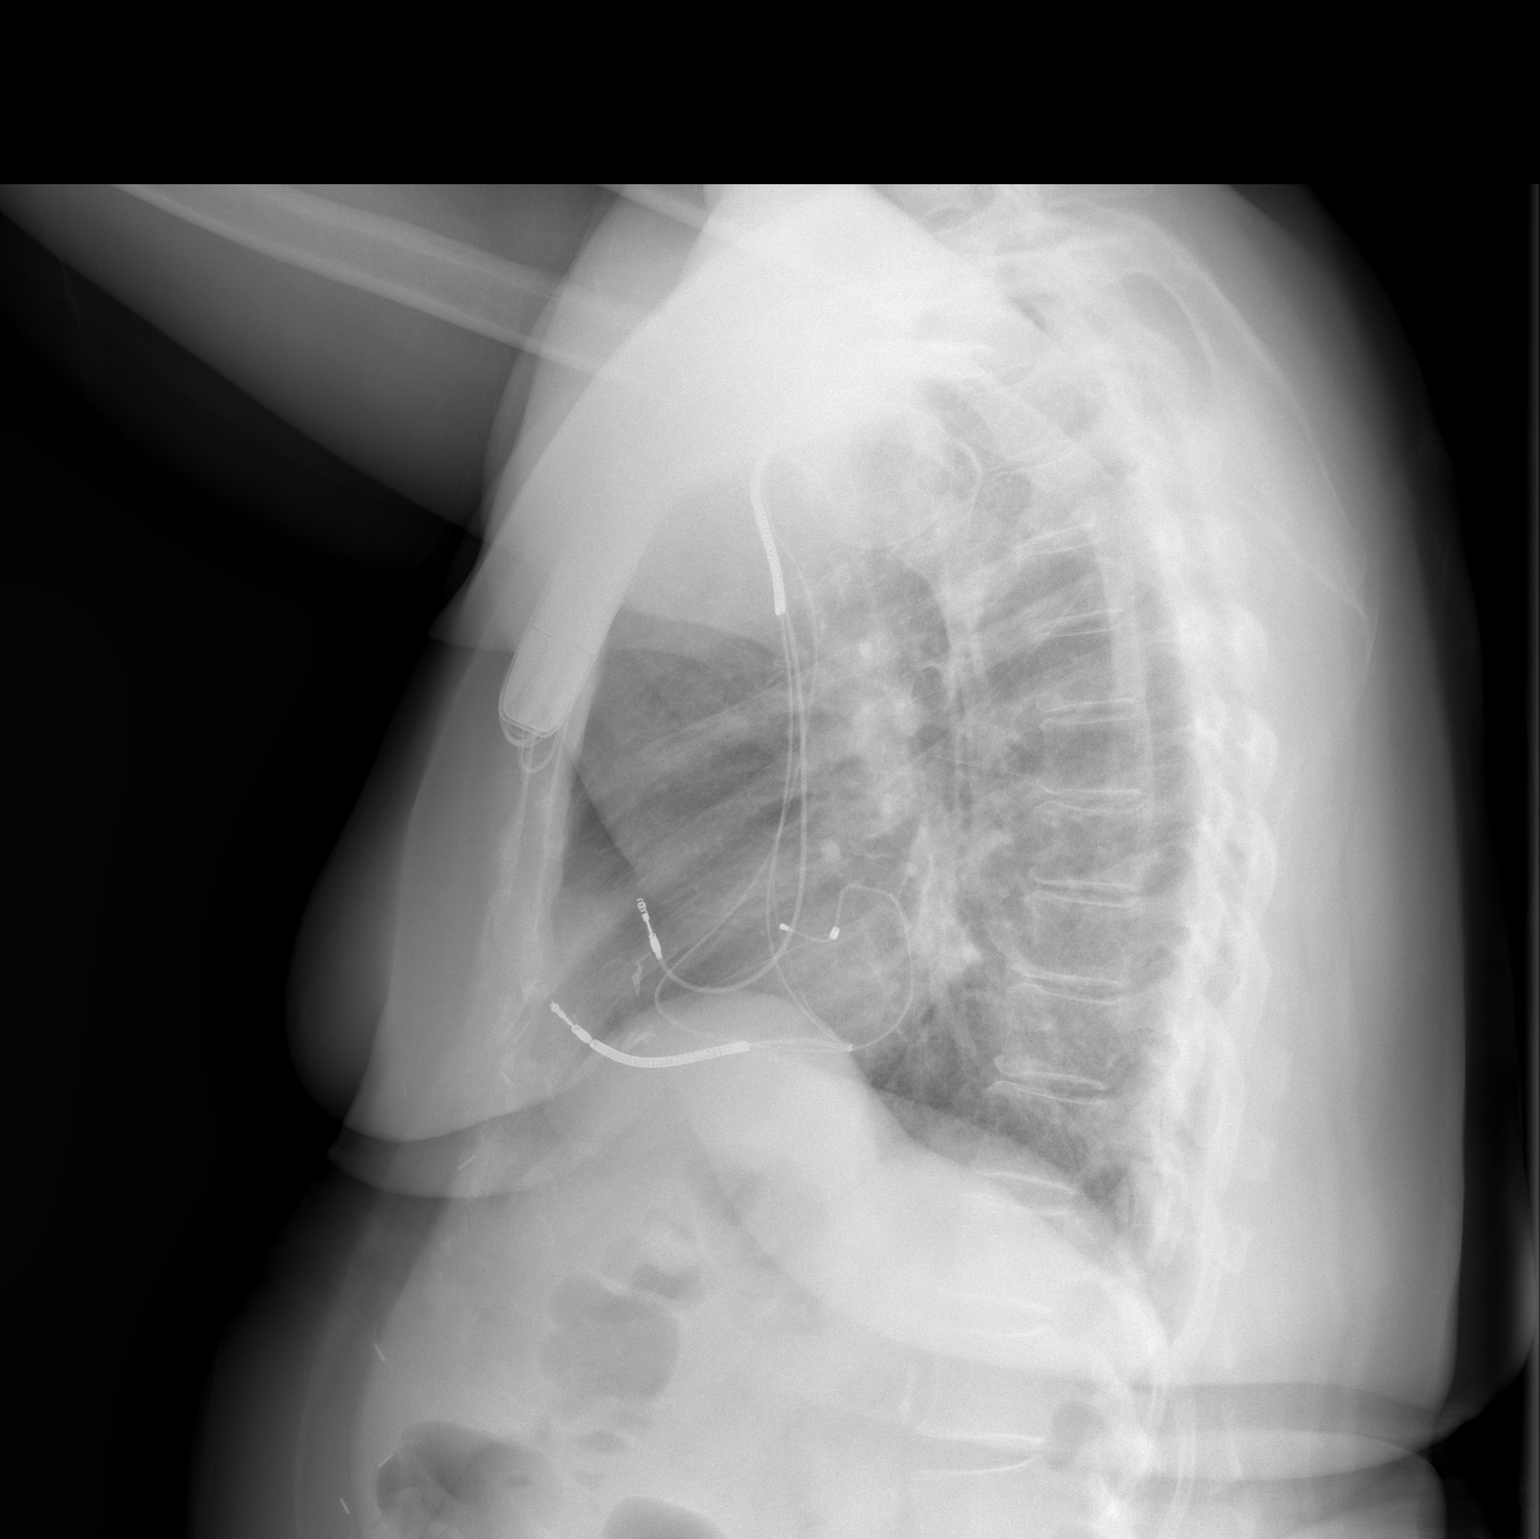

[2 of 2 positions shown; findings below may reference images not displayed]

FINDINGS: Heart size remains stable. AICD remains in place. Both lungs are
clear. Surgical clips are seen in both breasts and right axilla.
IMPRESSION: Stable exam.  No active cardiopulmonary disease.

## 2019-02-17 ENCOUNTER — Telehealth: Payer: Self-pay

## 2019-02-17 NOTE — Telephone Encounter (Signed)

## 2019-02-18 ENCOUNTER — Other Ambulatory Visit: Payer: Self-pay

## 2019-02-18 ENCOUNTER — Ambulatory Visit (INDEPENDENT_AMBULATORY_CARE_PROVIDER_SITE_OTHER): Payer: Medicare Other | Admitting: Pharmacist

## 2019-02-18 DIAGNOSIS — I639 Cerebral infarction, unspecified: Secondary | ICD-10-CM

## 2019-02-18 DIAGNOSIS — Z5181 Encounter for therapeutic drug level monitoring: Secondary | ICD-10-CM | POA: Diagnosis not present

## 2019-02-18 DIAGNOSIS — I480 Paroxysmal atrial fibrillation: Secondary | ICD-10-CM

## 2019-02-18 DIAGNOSIS — Z7901 Long term (current) use of anticoagulants: Secondary | ICD-10-CM

## 2019-02-18 LAB — POCT INR: INR: 1.8 — AB (ref 2.0–3.0)

## 2019-02-24 ENCOUNTER — Other Ambulatory Visit: Payer: Self-pay

## 2019-02-24 ENCOUNTER — Ambulatory Visit (INDEPENDENT_AMBULATORY_CARE_PROVIDER_SITE_OTHER): Payer: Medicare Other

## 2019-02-24 DIAGNOSIS — Z95810 Presence of automatic (implantable) cardiac defibrillator: Secondary | ICD-10-CM

## 2019-02-24 DIAGNOSIS — I5032 Chronic diastolic (congestive) heart failure: Secondary | ICD-10-CM

## 2019-02-25 NOTE — Progress Notes (Signed)
EPIC Encounter for ICM Monitoring  Patient Name: OVA GILLENTINE is a 68 y.o. female Date: 02/25/2019 Primary Care Physican: Maurice Small, MD Primary Cardiologist: Croitoru Electrophysiologist: Santina Evans Pacing: 97% 01/22/2019 Weight: 180 lbs 02/25/2019 Weight: unknown   Attempted call to patient and unable to reach.  Left detailed message per DPR regarding transmission. Transmission reviewed.   Corvue Thoracic impedance normal.   Prescribed:Furosemide 40 mg 1 tablet as needed and Potassium 20 mEq 1 tablet to be taken when taking Furosemide.  Recommendations:Left voice mail with ICM number and encouraged to call if experiencing any fluid symptoms..  Follow-up plan: ICM clinic phone appointment on6/10/2018.   Copy of ICM check sent to Dr.Taylor.  3 month ICM trend: 02/24/2019    1 Year ICM trend:       [name], RN 02/25/2019 12:20 PM

## 2019-02-26 ENCOUNTER — Telehealth: Payer: Self-pay

## 2019-02-26 NOTE — Telephone Encounter (Signed)
Remote ICM transmission received.  Attempted call to patient regarding ICM remote transmission and left detailed message, per DPR, with next ICM remote transmission date of 03/31/2019.  Advised to return call for any fluid symptoms or questions.    

## 2019-03-21 ENCOUNTER — Telehealth: Payer: Self-pay

## 2019-03-21 NOTE — Telephone Encounter (Signed)
lmom for prescreen reschedule appt to chst between 9-1pm

## 2019-03-21 NOTE — Telephone Encounter (Signed)

## 2019-03-25 ENCOUNTER — Other Ambulatory Visit: Payer: Self-pay

## 2019-03-25 ENCOUNTER — Ambulatory Visit (INDEPENDENT_AMBULATORY_CARE_PROVIDER_SITE_OTHER): Payer: Medicare Other | Admitting: Pharmacist

## 2019-03-25 DIAGNOSIS — I639 Cerebral infarction, unspecified: Secondary | ICD-10-CM

## 2019-03-25 DIAGNOSIS — Z7901 Long term (current) use of anticoagulants: Secondary | ICD-10-CM | POA: Diagnosis not present

## 2019-03-25 DIAGNOSIS — I480 Paroxysmal atrial fibrillation: Secondary | ICD-10-CM

## 2019-03-25 LAB — POCT INR: INR: 2.7 (ref 2.0–3.0)

## 2019-03-31 ENCOUNTER — Ambulatory Visit (INDEPENDENT_AMBULATORY_CARE_PROVIDER_SITE_OTHER): Payer: Medicare Other

## 2019-03-31 DIAGNOSIS — Z95810 Presence of automatic (implantable) cardiac defibrillator: Secondary | ICD-10-CM | POA: Diagnosis not present

## 2019-03-31 DIAGNOSIS — I5032 Chronic diastolic (congestive) heart failure: Secondary | ICD-10-CM | POA: Diagnosis not present

## 2019-04-01 NOTE — Progress Notes (Signed)
EPIC Encounter for ICM Monitoring  Patient Name: Danielle Mckinney is a 68 y.o. female Date: 04/01/2019 Primary Care Physican: Maurice Small, MD Primary Cardiologist: Croitoru Electrophysiologist: Santina Evans Pacing: 97% 01/22/2019 Weight:180lbs  Battery Longevity: ~ ERI 9.3 months   Attempted call to patient and unable to reach.  Left detailed message per DPR regarding transmission. Transmission reviewed.   Corvue Thoracic impedance normal.   Prescribed:Furosemide 40 mg 1 tablet as needed and Potassium 20 mEq 1 tablet to be taken when taking Furosemide.  Recommendations:Left voice mail with ICM number and encouraged to call if experiencing any fluid symptoms..  Follow-up plan: ICM clinic phone appointment on7/03/2019.   Copy of ICM check sent to Dr.Taylor.   3 month ICM trend: 04/01/2019    1 Year ICM trend:       [name], RN 04/01/2019 10:42 AM

## 2019-04-02 ENCOUNTER — Telehealth: Payer: Self-pay

## 2019-04-02 NOTE — Telephone Encounter (Signed)
Remote ICM transmission received.  Attempted call to patient regarding ICM remote transmission and left detailed message, per DPR, with next ICM remote transmission date of 05/05/2019.  Advised to return call for any fluid symptoms or questions.    

## 2019-04-06 ENCOUNTER — Other Ambulatory Visit: Payer: Self-pay | Admitting: Cardiovascular Disease

## 2019-04-10 ENCOUNTER — Other Ambulatory Visit: Payer: Self-pay | Admitting: Cardiovascular Disease

## 2019-04-10 DIAGNOSIS — I5032 Chronic diastolic (congestive) heart failure: Secondary | ICD-10-CM

## 2019-04-15 ENCOUNTER — Telehealth: Payer: Self-pay

## 2019-04-15 NOTE — Telephone Encounter (Signed)
Patient called to ask why she can not get a potassium refill prescription.  She spoke with Dr Victorino December office last week and requested a refill for PRN Lasix and PRN potassium.  The PRN Lasix was refilled but she is unsure why PRN potassium was not.  She requested a message to be sent to Dr Croitoru's office to get a new PRN Potassium prescription.  Call routed to [facility] Refill Department.

## 2019-04-15 NOTE — Telephone Encounter (Signed)

## 2019-04-15 NOTE — Telephone Encounter (Signed)
Call back to patient as requested regarding update on medication refill.  Advised the refill department has not had a chance to reorder.  Advised will let her know when the prescription has been sent to the pharmacy.

## 2019-04-16 ENCOUNTER — Other Ambulatory Visit: Payer: Self-pay | Admitting: *Deleted

## 2019-04-16 MED ORDER — POTASSIUM CHLORIDE CRYS ER 20 MEQ PO TBCR: EXTENDED_RELEASE_TABLET | ORAL | 3 refills | Status: DC

## 2019-04-16 NOTE — Telephone Encounter (Signed)
Spoke with patient. Advised Potassium prescription has been sent to the pharmacy and if she has any further questions to call back.

## 2019-04-22 ENCOUNTER — Other Ambulatory Visit: Payer: Self-pay

## 2019-04-22 ENCOUNTER — Ambulatory Visit (INDEPENDENT_AMBULATORY_CARE_PROVIDER_SITE_OTHER): Payer: Medicare Other | Admitting: Pharmacist Clinician (PhC)/ Clinical Pharmacy Specialist

## 2019-04-22 DIAGNOSIS — I639 Cerebral infarction, unspecified: Secondary | ICD-10-CM | POA: Diagnosis not present

## 2019-04-22 DIAGNOSIS — Z7901 Long term (current) use of anticoagulants: Secondary | ICD-10-CM

## 2019-04-22 DIAGNOSIS — I480 Paroxysmal atrial fibrillation: Secondary | ICD-10-CM

## 2019-04-22 LAB — POCT INR: INR: 3.4 — AB (ref 2.0–3.0)

## 2019-05-05 ENCOUNTER — Ambulatory Visit (INDEPENDENT_AMBULATORY_CARE_PROVIDER_SITE_OTHER): Payer: Medicare Other

## 2019-05-05 DIAGNOSIS — I5032 Chronic diastolic (congestive) heart failure: Secondary | ICD-10-CM | POA: Diagnosis not present

## 2019-05-05 DIAGNOSIS — Z95810 Presence of automatic (implantable) cardiac defibrillator: Secondary | ICD-10-CM | POA: Diagnosis not present

## 2019-05-06 ENCOUNTER — Telehealth: Payer: Self-pay

## 2019-05-06 ENCOUNTER — Ambulatory Visit (INDEPENDENT_AMBULATORY_CARE_PROVIDER_SITE_OTHER): Payer: Medicare Other | Admitting: *Deleted

## 2019-05-06 DIAGNOSIS — I5032 Chronic diastolic (congestive) heart failure: Secondary | ICD-10-CM | POA: Diagnosis not present

## 2019-05-06 NOTE — Telephone Encounter (Signed)

## 2019-05-07 ENCOUNTER — Telehealth: Payer: Self-pay | Admitting: Cardiovascular Disease

## 2019-05-07 LAB — CUP PACEART REMOTE DEVICE CHECK
Battery Remaining Longevity: 8 mo
Battery Remaining Percentage: 9 %
Battery Voltage: 2.66 V
Brady Statistic AP VP Percent: 9.8 %
Brady Statistic AP VS Percent: 1 %
Brady Statistic AS VP Percent: 85 %
Brady Statistic AS VS Percent: 1 %
Brady Statistic RA Percent Paced: 6.6 %
Date Time Interrogation Session: 20200706173349
HighPow Impedance: 47 Ohm
Implantable Lead Implant Date: 20181119
Implantable Lead Implant Date: 20181119
Implantable Lead Location: 753859
Implantable Lead Location: 753860
Implantable Pulse Generator Implant Date: 20130220
Lead Channel Impedance Value: 300 Ohm
Lead Channel Impedance Value: 450 Ohm
Lead Channel Impedance Value: 680 Ohm
Lead Channel Pacing Threshold Amplitude: 0.5 V
Lead Channel Pacing Threshold Amplitude: 0.75 V
Lead Channel Pacing Threshold Amplitude: 0.75 V
Lead Channel Pacing Threshold Pulse Width: 0.5 ms
Lead Channel Pacing Threshold Pulse Width: 0.5 ms
Lead Channel Pacing Threshold Pulse Width: 0.8 ms
Lead Channel Sensing Intrinsic Amplitude: 11.3 mV
Lead Channel Sensing Intrinsic Amplitude: 5 mV
Lead Channel Setting Pacing Amplitude: 1.5 V
Lead Channel Setting Pacing Amplitude: 1.75 V
Lead Channel Setting Pacing Amplitude: 2 V
Lead Channel Setting Pacing Pulse Width: 0.5 ms
Lead Channel Setting Pacing Pulse Width: 0.8 ms
Lead Channel Setting Sensing Sensitivity: 0.5 mV
Pulse Gen Serial Number: 7027976

## 2019-05-07 NOTE — Telephone Encounter (Signed)
I let the pt know that we did indeed get her transmission 05-05-2019

## 2019-05-07 NOTE — Telephone Encounter (Signed)
New message   Patient has questions about if her transmission was received. Please call.

## 2019-05-07 NOTE — Progress Notes (Signed)
EPIC Encounter for ICM Monitoring  Patient Name: Danielle Mckinney is a 67 y.o. female Date: 05/07/2019 Primary Care Physican: Maurice Small, MD Primary Cardiologist: Croitoru Electrophysiologist: Santina Evans Pacing: 95% 01/22/2019 Weight:180lbs   Battery Longevity: ~ ERI 8 months   Transmission reviewed.   CorvueThoracic impedance normal.   Prescribed:Furosemide 40 mg 1 tablet as needed and Potassium 20 mEq 1 tablet to be taken when taking Furosemide.  Recommendations:Left voice mail with ICM number and encouraged to call if experiencing any fluid symptoms..  Follow-up plan: ICM clinic phone appointment on8/07/2019.   Copy of ICM check sent to Dr.Taylor.   3 month ICM trend: 05/07/2019    1 Year ICM trend:       [name], RN 05/07/2019 8:26 AM

## 2019-05-13 ENCOUNTER — Ambulatory Visit (INDEPENDENT_AMBULATORY_CARE_PROVIDER_SITE_OTHER): Payer: Medicare Other | Admitting: Pharmacist Clinician (PhC)/ Clinical Pharmacy Specialist

## 2019-05-13 ENCOUNTER — Other Ambulatory Visit: Payer: Self-pay

## 2019-05-13 DIAGNOSIS — Z7901 Long term (current) use of anticoagulants: Secondary | ICD-10-CM | POA: Diagnosis not present

## 2019-05-13 DIAGNOSIS — I480 Paroxysmal atrial fibrillation: Secondary | ICD-10-CM

## 2019-05-13 DIAGNOSIS — I639 Cerebral infarction, unspecified: Secondary | ICD-10-CM | POA: Diagnosis not present

## 2019-05-13 LAB — POCT INR: INR: 2.8 (ref 2.0–3.0)

## 2019-05-18 ENCOUNTER — Encounter: Payer: Self-pay | Admitting: Cardiology

## 2019-05-18 NOTE — Progress Notes (Signed)
Remote pacemaker transmission.   

## 2019-06-09 ENCOUNTER — Ambulatory Visit (INDEPENDENT_AMBULATORY_CARE_PROVIDER_SITE_OTHER): Payer: Medicare Other

## 2019-06-09 DIAGNOSIS — Z95810 Presence of automatic (implantable) cardiac defibrillator: Secondary | ICD-10-CM | POA: Diagnosis not present

## 2019-06-09 DIAGNOSIS — I5032 Chronic diastolic (congestive) heart failure: Secondary | ICD-10-CM | POA: Diagnosis not present

## 2019-06-11 NOTE — Progress Notes (Signed)
EPIC Encounter for ICM Monitoring  Patient Name: Danielle Mckinney is a 68 y.o. female Date: 06/11/2019 Primary Care Physican: Maurice Small, MD Primary Cardiologist: Croitoru Electrophysiologist: Santina Evans Pacing: 95% 06/11/2019 Weight:180lbs   Battery Longevity: ~ ERI 6.8 months   Spoke with patient.  She said she is doing well.   CorvueThoracic impedance normal.   Prescribed:Furosemide 40 mg 1 tablet as needed and Potassium 20 mEq 1 tablet to be taken when taking Furosemide.  Recommendations: No changes and encouraged to call if experiencing any fluid symptoms.  Follow-up plan: ICM clinic phone appointment on9/14/2020.   Copy of ICM check sent to Dr.Taylor.   3 month ICM trend: 06/09/2019    1 Year ICM trend:       [name], RN 06/11/2019 9:42 AM

## 2019-06-12 ENCOUNTER — Ambulatory Visit (INDEPENDENT_AMBULATORY_CARE_PROVIDER_SITE_OTHER): Payer: Medicare Other | Admitting: Pharmacist

## 2019-06-12 ENCOUNTER — Other Ambulatory Visit: Payer: Self-pay

## 2019-06-12 DIAGNOSIS — I480 Paroxysmal atrial fibrillation: Secondary | ICD-10-CM | POA: Diagnosis not present

## 2019-06-12 DIAGNOSIS — I639 Cerebral infarction, unspecified: Secondary | ICD-10-CM | POA: Diagnosis not present

## 2019-06-12 DIAGNOSIS — Z7901 Long term (current) use of anticoagulants: Secondary | ICD-10-CM

## 2019-06-12 LAB — POCT INR: INR: 2.2 (ref 2.0–3.0)

## 2019-07-01 IMAGING — CT CT VIRTUAL COLONOSCOPY SCREENING
2 of 9 series · 12 of 46 positions shown, 14 images · non-contrast
Comparison: Abdominopelvic CT of 12/23/2014

CLINICAL DATA: Screening colonoscopy. Patient on Coumadin for
congestive heart failure. Breast cancer.

EXAM:
CT VIRTUAL COLONOSCOPY SCREENING
TECHNIQUE: The patient was given a standard bowel preparation with Gastrografin
and barium for fluid and stool tagging respectively. The quality of
the bowel preparation is moderate. Automated CO2 insufflation of the
colon was performed prior to image acquisition and colonic
distention is moderate. Image post processing was used to generate a
3D endoluminal fly-through projection of the colon and to
electronically subtract stool/fluid as appropriate.

[Series 3: supine colon 1.50 br40 s3 supine thins · axial · 0.85mm/px · z∈[+1320,+1704]mm · 9 of 320 slices shown, 11 images]
[im 32/320  soft-tissue]
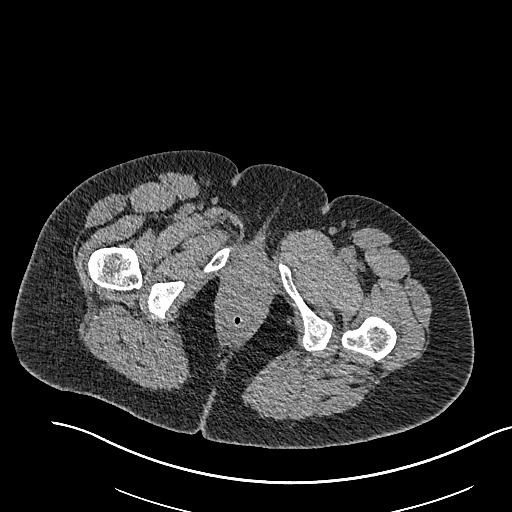
[im 32/320  bone]
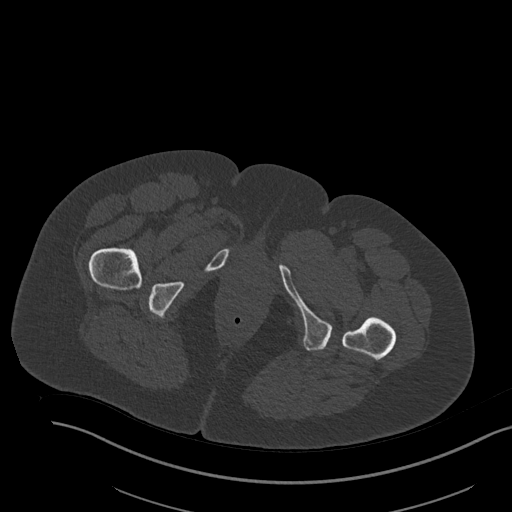
[im 64/320  soft-tissue]
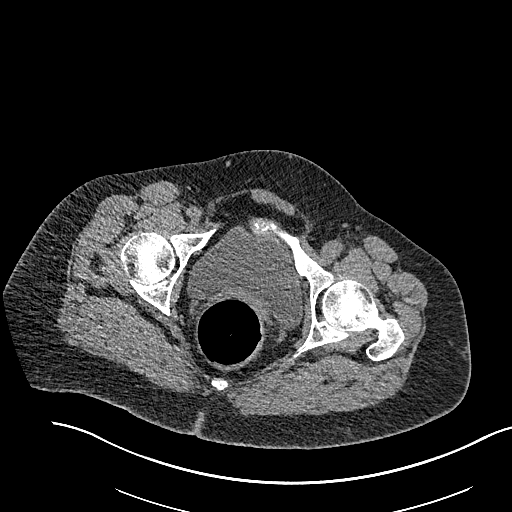
[im 96/320  soft-tissue]
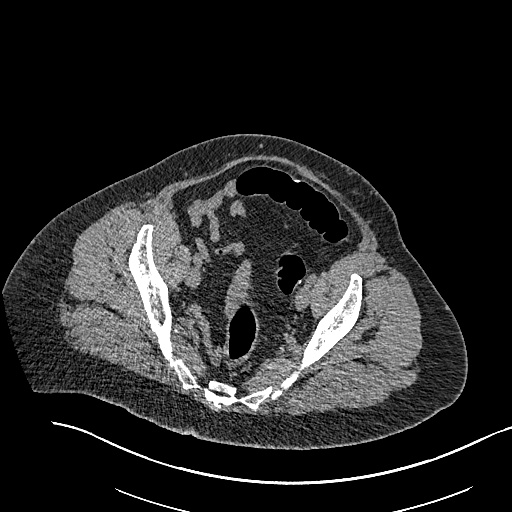
[im 128/320  soft-tissue]
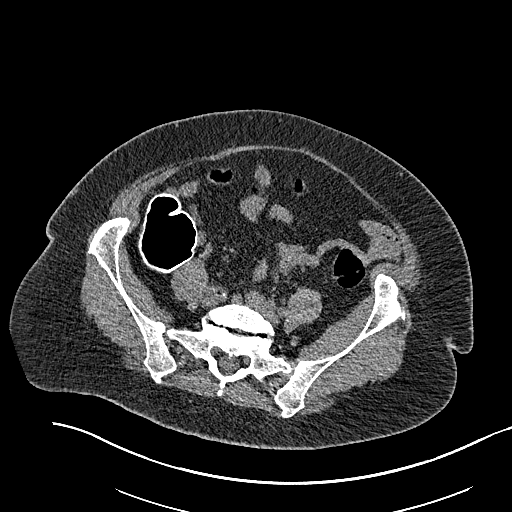
[im 160/320  soft-tissue]
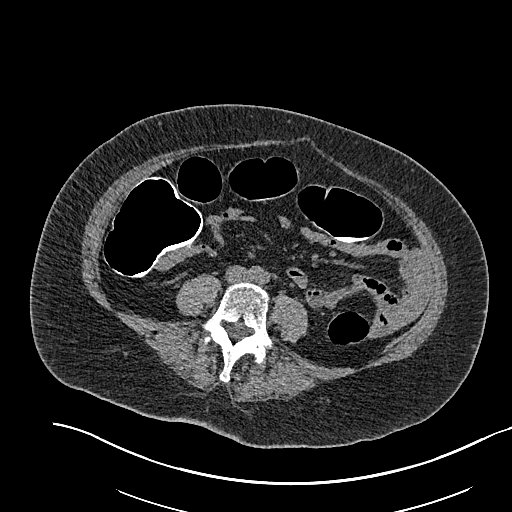
[im 192/320  soft-tissue]
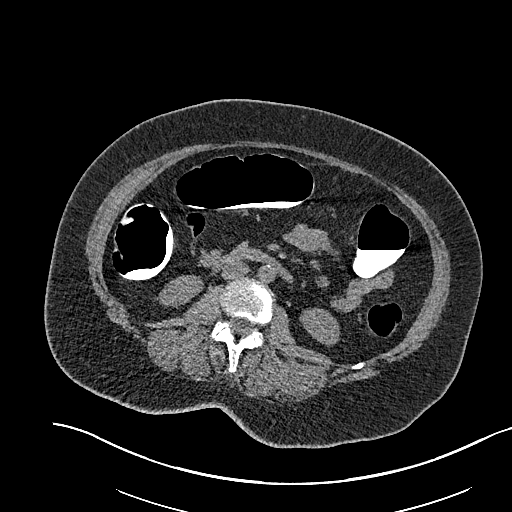
[im 224/320  soft-tissue]
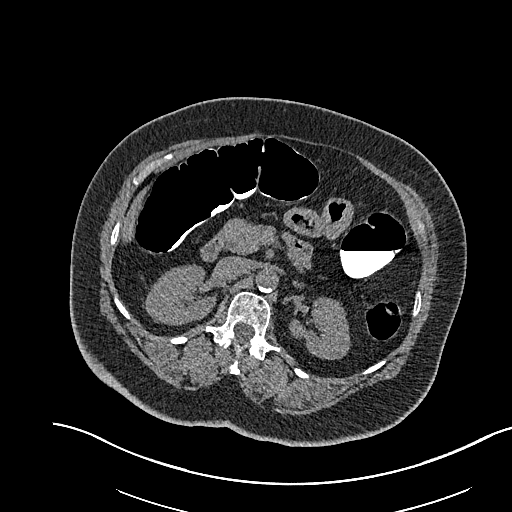
[im 256/320  soft-tissue]
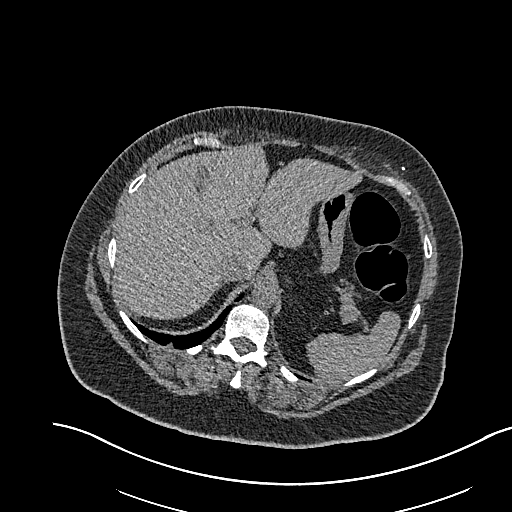
[im 288/320  soft-tissue]
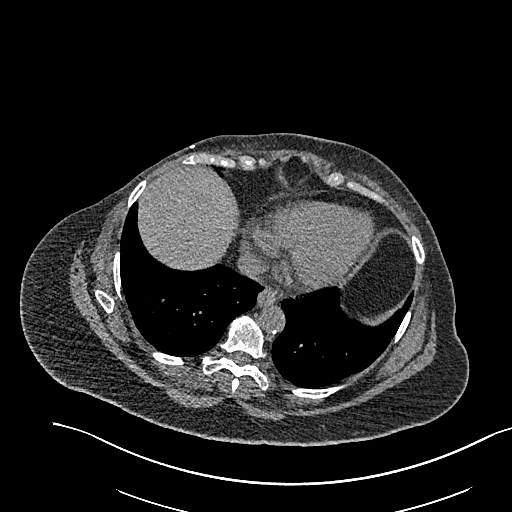
[im 288/320  bone]
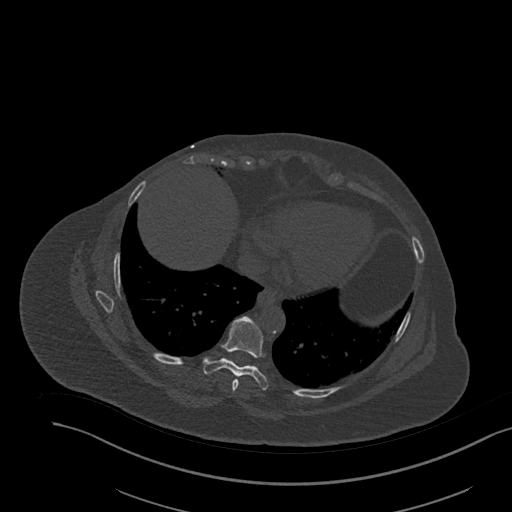

[Series 5: supine colon 3.00 br40 s3 cor cor supine · coronal · 0.81mm/px · 3 of 100 slices shown]
[im 25/100  soft-tissue]
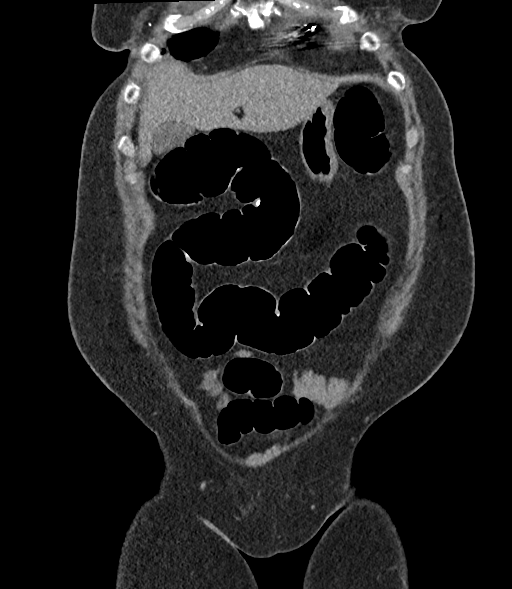
[im 50/100  soft-tissue]
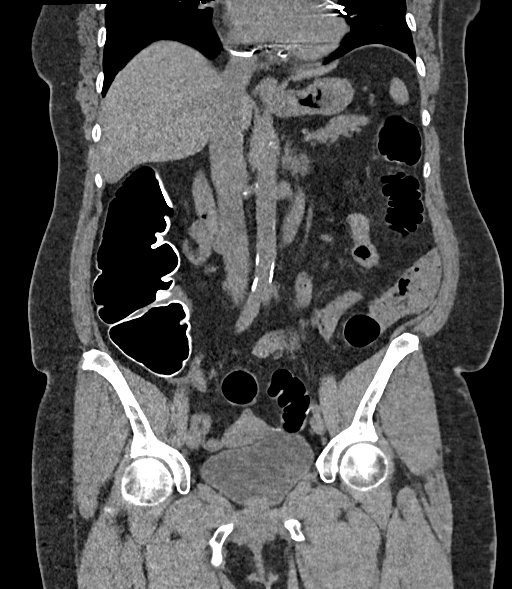
[im 75/100  soft-tissue]
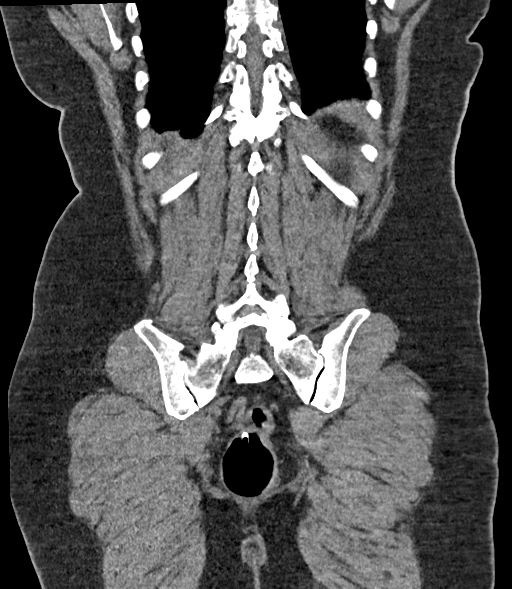

[12 of 46 positions shown; findings below may reference images not displayed]

FINDINGS: VIRTUAL COLONOSCOPY

On both supine and prone images, the sigmoid is underdistended,
without circumferential mass. Apparent soft tissue thickening along
the left side of the rectum including on image 272/3 and more
anteriorly on image 274/10 is favored to be due to retained
fluid/contrast.

More proximally, distension is good and there is no evidence of
clinically significant colonic polyp or mass. There is a moderate
amount of retained contrast, including coating the ascending colon.

Virtual colonoscopy is not designed to detect diminutive polyps
(i.e., less than or equal to 5 mm), the presence or absence of which
may not affect clinical management.

CT ABDOMEN AND PELVIS WITHOUT CONTRAST

Lower chest: Right lower lobe pleural-based nodularity is unchanged
and can be presumed benign. Normal heart size without pericardial or
pleural effusion. Incompletely imaged pacer.

Hepatobiliary: Normal liver. Normal gallbladder, without biliary
ductal dilatation.

Pancreas: Normal, without mass or ductal dilatation.

Spleen: Normal in size, without focal abnormality.

Adrenals/Urinary Tract: Normal adrenal glands. No renal calculi or
hydronephrosis. Normal noncontrast appearance of the urinary
bladder.

Stomach/Bowel: Normal stomach, without wall thickening. Normal
terminal ileum. Normal small bowel.

Vascular/Lymphatic: Aortic and branch vessel atherosclerosis. No
abdominopelvic adenopathy.

Reproductive: Normal uterus and adnexa.

Other: No significant free fluid.  No free intraperitoneal air.

Musculoskeletal: Degenerative sclerosis of the bilateral sacroiliac
joints. Advanced degenerative disc disease at the lumbosacral
junction.
IMPRESSION: 1. Mild limitations, primarily involving the sigmoid colon. No
evidence of clinically significant colonic polyp or mass. Favor
retained fluid/contrast along the left anterior rectal wall.
Consider physical exam correlation.
2.  Aortic Atherosclerosis ([id number]).
3.  No acute process in the abdomen or pelvis.

## 2019-07-08 ENCOUNTER — Ambulatory Visit (INDEPENDENT_AMBULATORY_CARE_PROVIDER_SITE_OTHER): Payer: Medicare Other | Admitting: Pharmacist

## 2019-07-08 ENCOUNTER — Other Ambulatory Visit: Payer: Self-pay

## 2019-07-08 DIAGNOSIS — I480 Paroxysmal atrial fibrillation: Secondary | ICD-10-CM | POA: Diagnosis not present

## 2019-07-08 DIAGNOSIS — Z7901 Long term (current) use of anticoagulants: Secondary | ICD-10-CM

## 2019-07-08 DIAGNOSIS — I639 Cerebral infarction, unspecified: Secondary | ICD-10-CM | POA: Diagnosis not present

## 2019-07-08 LAB — POCT INR: INR: 2.6 (ref 2.0–3.0)

## 2019-07-14 ENCOUNTER — Ambulatory Visit (INDEPENDENT_AMBULATORY_CARE_PROVIDER_SITE_OTHER): Payer: Medicare Other

## 2019-07-14 DIAGNOSIS — I5032 Chronic diastolic (congestive) heart failure: Secondary | ICD-10-CM

## 2019-07-14 DIAGNOSIS — Z95810 Presence of automatic (implantable) cardiac defibrillator: Secondary | ICD-10-CM | POA: Diagnosis not present

## 2019-07-14 NOTE — Progress Notes (Signed)
EPIC Encounter for ICM Monitoring  Patient Name: ELLIOTTE CELAYA is a 68 y.o. female Date: 07/14/2019 Primary Care Physican: Maurice Small, MD Primary Cardiologist: Croitoru Electrophysiologist: Santina Evans Pacing: 95%(8/31 report) 06/11/2019 Weight:180lbs  Battery Longevity: ~ ERI6.62months   Transmission reviewed.   CorvueThoracic impedance normal.   Prescribed:Furosemide 40 mg 1 tablet as needed and Potassium 20 mEq 1 tablet to be taken when taking Furosemide.  Recommendations: None.  Follow-up plan: ICM clinic phone appointment on 09/08/2019.   91 day device clinic remote transmission 08/06/2019.  Office appt 08/07/2019 with Dr. Lovena Le.    Copy of ICM check sent to Dr. Lovena Le.   3 month ICM direct trend viewer: 07/14/2019    [name], RN 07/14/2019 12:29 PM

## 2019-07-15 ENCOUNTER — Telehealth: Payer: Self-pay | Admitting: Cardiovascular Disease

## 2019-07-15 NOTE — Progress Notes (Signed)
Attempted returned call to patient.  Left message per DPR stating remote transmission was received.  Left direct number for call back.   Bi-V Pacing: 95%

## 2019-07-15 NOTE — Telephone Encounter (Signed)
°  Patient wanted to know if she needed to send a transmission in. She thinks she may have missed her scheduled transmission. Please advise

## 2019-07-15 NOTE — Telephone Encounter (Signed)
Attempted returned call to patient.  Left message per DPR stating remote transmission was received.  Left direct number for call back.

## 2019-07-15 NOTE — Telephone Encounter (Signed)
I spoke with the pt and let her know to send the transmission. I told her I will let Sharman Cheek know that I told her to send today. The pt thanked me for the call.

## 2019-07-30 ENCOUNTER — Other Ambulatory Visit: Payer: Self-pay

## 2019-07-30 MED ORDER — SPIRONOLACTONE 25 MG PO TABS: 25.0000 mg | ORAL_TABLET | Freq: Every day | ORAL | 2 refills | Status: DC

## 2019-07-31 ENCOUNTER — Other Ambulatory Visit: Payer: Self-pay

## 2019-07-31 MED ORDER — LOSARTAN POTASSIUM 100 MG PO TABS: ORAL_TABLET | ORAL | 2 refills | Status: DC

## 2019-08-05 ENCOUNTER — Inpatient Hospital Stay: Payer: Medicare Other | Attending: Oncology

## 2019-08-05 ENCOUNTER — Other Ambulatory Visit: Payer: Self-pay

## 2019-08-05 DIAGNOSIS — Z90710 Acquired absence of both cervix and uterus: Secondary | ICD-10-CM | POA: Insufficient documentation

## 2019-08-05 DIAGNOSIS — Z853 Personal history of malignant neoplasm of breast: Secondary | ICD-10-CM | POA: Diagnosis not present

## 2019-08-05 DIAGNOSIS — I110 Hypertensive heart disease with heart failure: Secondary | ICD-10-CM | POA: Insufficient documentation

## 2019-08-05 DIAGNOSIS — I5032 Chronic diastolic (congestive) heart failure: Secondary | ICD-10-CM | POA: Diagnosis not present

## 2019-08-05 DIAGNOSIS — Z7901 Long term (current) use of anticoagulants: Secondary | ICD-10-CM | POA: Insufficient documentation

## 2019-08-05 DIAGNOSIS — Z803 Family history of malignant neoplasm of breast: Secondary | ICD-10-CM | POA: Diagnosis not present

## 2019-08-05 DIAGNOSIS — Z1501 Genetic susceptibility to malignant neoplasm of breast: Secondary | ICD-10-CM | POA: Diagnosis not present

## 2019-08-05 DIAGNOSIS — Z79899 Other long term (current) drug therapy: Secondary | ICD-10-CM | POA: Diagnosis not present

## 2019-08-05 DIAGNOSIS — Z9221 Personal history of antineoplastic chemotherapy: Secondary | ICD-10-CM | POA: Diagnosis not present

## 2019-08-05 DIAGNOSIS — I429 Cardiomyopathy, unspecified: Secondary | ICD-10-CM | POA: Insufficient documentation

## 2019-08-05 DIAGNOSIS — Z9013 Acquired absence of bilateral breasts and nipples: Secondary | ICD-10-CM | POA: Diagnosis not present

## 2019-08-05 DIAGNOSIS — Z8673 Personal history of transient ischemic attack (TIA), and cerebral infarction without residual deficits: Secondary | ICD-10-CM | POA: Diagnosis not present

## 2019-08-05 DIAGNOSIS — Z90722 Acquired absence of ovaries, bilateral: Secondary | ICD-10-CM | POA: Diagnosis not present

## 2019-08-05 DIAGNOSIS — C50912 Malignant neoplasm of unspecified site of left female breast: Secondary | ICD-10-CM

## 2019-08-05 DIAGNOSIS — C50911 Malignant neoplasm of unspecified site of right female breast: Secondary | ICD-10-CM

## 2019-08-05 DIAGNOSIS — I4891 Unspecified atrial fibrillation: Secondary | ICD-10-CM | POA: Diagnosis not present

## 2019-08-05 LAB — CBC WITH DIFFERENTIAL/PLATELET
Abs Immature Granulocytes: 0.02 10*3/uL (ref 0.00–0.07)
Basophils Absolute: 0 10*3/uL (ref 0.0–0.1)
Basophils Relative: 0 %
Eosinophils Absolute: 0.2 10*3/uL (ref 0.0–0.5)
Eosinophils Relative: 2 %
HCT: 37.7 % (ref 36.0–46.0)
Hemoglobin: 12.5 g/dL (ref 12.0–15.0)
Immature Granulocytes: 0 %
Lymphocytes Relative: 37 %
Lymphs Abs: 2.9 10*3/uL (ref 0.7–4.0)
MCH: 26.7 pg (ref 26.0–34.0)
MCHC: 33.2 g/dL (ref 30.0–36.0)
MCV: 80.4 fL (ref 80.0–100.0)
Monocytes Absolute: 0.8 10*3/uL (ref 0.1–1.0)
Monocytes Relative: 10 %
Neutro Abs: 4 10*3/uL (ref 1.7–7.7)
Neutrophils Relative %: 51 %
Platelets: 223 10*3/uL (ref 150–400)
RBC: 4.69 MIL/uL (ref 3.87–5.11)
RDW: 15.3 % (ref 11.5–15.5)
WBC: 8 10*3/uL (ref 4.0–10.5)
nRBC: 0 % (ref 0.0–0.2)

## 2019-08-05 LAB — COMPREHENSIVE METABOLIC PANEL
ALT: 13 U/L (ref 0–44)
AST: 23 U/L (ref 15–41)
Albumin: 4.1 g/dL (ref 3.5–5.0)
Alkaline Phosphatase: 62 U/L (ref 38–126)
Anion gap: 10 (ref 5–15)
BUN: 21 mg/dL (ref 8–23)
CO2: 24 mmol/L (ref 22–32)
Calcium: 9.8 mg/dL (ref 8.9–10.3)
Chloride: 105 mmol/L (ref 98–111)
Creatinine, Ser: 1.04 mg/dL — ABNORMAL HIGH (ref 0.44–1.00)
GFR calc Af Amer: >60 mL/min (ref >60)
GFR calc non Af Amer: 56 mL/min — ABNORMAL LOW (ref >60)
Glucose, Bld: 108 mg/dL — ABNORMAL HIGH (ref 70–99)
Potassium: 4 mmol/L (ref 3.5–5.1)
Sodium: 139 mmol/L (ref 135–145)
Total Bilirubin: 0.6 mg/dL (ref 0.3–1.2)
Total Protein: 7.6 g/dL (ref 6.5–8.1)

## 2019-08-06 ENCOUNTER — Ambulatory Visit (INDEPENDENT_AMBULATORY_CARE_PROVIDER_SITE_OTHER): Payer: Medicare Other | Admitting: Pharmacist

## 2019-08-06 ENCOUNTER — Ambulatory Visit (INDEPENDENT_AMBULATORY_CARE_PROVIDER_SITE_OTHER): Payer: Medicare Other | Admitting: *Deleted

## 2019-08-06 DIAGNOSIS — I480 Paroxysmal atrial fibrillation: Secondary | ICD-10-CM

## 2019-08-06 DIAGNOSIS — I428 Other cardiomyopathies: Secondary | ICD-10-CM

## 2019-08-06 DIAGNOSIS — I5032 Chronic diastolic (congestive) heart failure: Secondary | ICD-10-CM

## 2019-08-06 DIAGNOSIS — I639 Cerebral infarction, unspecified: Secondary | ICD-10-CM

## 2019-08-06 DIAGNOSIS — Z7901 Long term (current) use of anticoagulants: Secondary | ICD-10-CM

## 2019-08-06 LAB — POCT INR: INR: 2.1 (ref 2.0–3.0)

## 2019-08-06 LAB — CANCER ANTIGEN 27.29: CA 27.29: 22.5 U/mL (ref 0.0–38.6)

## 2019-08-07 ENCOUNTER — Encounter: Payer: Self-pay | Admitting: Internal Medicine

## 2019-08-07 ENCOUNTER — Other Ambulatory Visit: Payer: Self-pay

## 2019-08-07 ENCOUNTER — Ambulatory Visit (INDEPENDENT_AMBULATORY_CARE_PROVIDER_SITE_OTHER): Payer: Medicare Other | Admitting: Internal Medicine

## 2019-08-07 VITALS — BP 128/80 | HR 69 | Ht 62.0 in | Wt 191.0 lb

## 2019-08-07 DIAGNOSIS — Z95810 Presence of automatic (implantable) cardiac defibrillator: Secondary | ICD-10-CM | POA: Diagnosis not present

## 2019-08-07 DIAGNOSIS — I10 Essential (primary) hypertension: Secondary | ICD-10-CM

## 2019-08-07 DIAGNOSIS — I428 Other cardiomyopathies: Secondary | ICD-10-CM

## 2019-08-07 LAB — CUP PACEART REMOTE DEVICE CHECK
Battery Remaining Longevity: 6 mo
Battery Remaining Percentage: 6 %
Battery Voltage: 2.63 V
Brady Statistic AP VP Percent: 10 %
Brady Statistic AP VS Percent: 1 %
Brady Statistic AS VP Percent: 84 %
Brady Statistic AS VS Percent: 1 %
Brady Statistic RA Percent Paced: 6.4 %
Date Time Interrogation Session: 20201007093436
HighPow Impedance: 49 Ohm
Implantable Lead Implant Date: 20181119
Implantable Lead Implant Date: 20181119
Implantable Lead Location: 753859
Implantable Lead Location: 753860
Implantable Pulse Generator Implant Date: 20130220
Lead Channel Impedance Value: 340 Ohm
Lead Channel Impedance Value: 490 Ohm
Lead Channel Impedance Value: 730 Ohm
Lead Channel Pacing Threshold Amplitude: 0.625 V
Lead Channel Pacing Threshold Amplitude: 0.75 V
Lead Channel Pacing Threshold Amplitude: 0.75 V
Lead Channel Pacing Threshold Pulse Width: 0.5 ms
Lead Channel Pacing Threshold Pulse Width: 0.5 ms
Lead Channel Pacing Threshold Pulse Width: 0.8 ms
Lead Channel Sensing Intrinsic Amplitude: 11.3 mV
Lead Channel Sensing Intrinsic Amplitude: 5 mV
Lead Channel Setting Pacing Amplitude: 1.625
Lead Channel Setting Pacing Amplitude: 1.75 V
Lead Channel Setting Pacing Amplitude: 2 V
Lead Channel Setting Pacing Pulse Width: 0.5 ms
Lead Channel Setting Pacing Pulse Width: 0.8 ms
Lead Channel Setting Sensing Sensitivity: 0.5 mV
Pulse Gen Serial Number: 7027976

## 2019-08-07 NOTE — Progress Notes (Signed)
HPI Danielle Mckinney returns today for followup. She is a pleasant 68 yo woman with chronic systolic heart failure, s/p ICD insertion. She has done well in the interim. No chest pain, sob or syncope. Her tachy therapies were turned off as her EF normalized and she has a remote h/o inappropriate therapies.  Allergies  Allergen Reactions  . Codeine Nausea And Vomiting  . Lisinopril Cough  . Oxycodone Nausea And Vomiting  . Penicillins Nausea And Vomiting  . Sulfa Antibiotics Nausea And Vomiting     Current Outpatient Medications  Medication Sig Dispense Refill  . Calcium-Magnesium-Vitamin D (CALCIUM 1200+D3 PO) Take by mouth.    . carvedilol (COREG) 6.25 MG tablet TAKE 1 AND 1/2 TABLETS BY MOUTH TWICE A DAY WITH FOOD 270 tablet 3  . Cholecalciferol (D3-1000) 1000 UNITS tablet Take 1,000 Units by mouth daily.    . furosemide (LASIX) 40 MG tablet TAKE 1 TABLET BY MOUTH EVERY DAY AS NEEDED FOR SWELLING 90 tablet 3  . Krill Oil 500 MG CAPS Take 500 mg by mouth daily.    [name] Kitchen losartan (COZAAR) 100 MG tablet TAKE 1/2 TABLET(50 MG) BY MOUTH TWICE DAILY 90 tablet 2  . potassium chloride SA (K-DUR) 20 MEQ tablet take 1 tablet by mouth once daily if needed **TAKE ALONG WITH FUROSEMIDE WHEN NEEDED** 30 tablet 3  . spironolactone (ALDACTONE) 25 MG tablet Take 1 tablet (25 mg total) by mouth daily. 30 tablet 2  . triamcinolone (KENALOG) 0.025 % ointment Apply 1 application topically 2 (two) times daily. 30 g 0  . warfarin (COUMADIN) 4 MG tablet TAKE 1 TO 1 AND 1/2 TABLETS BY MOUTH AS DIRECTED BY COUMADIN CLINIC 135 tablet 1   No current facility-administered medications for this visit.      Past Medical History:  Diagnosis Date  . Atrial fibrillation ([place])   . Automatic implantable cardioverter-defibrillator in situ 08/25/2009   Qualifier: Diagnosis of  By: Lovena Le, MD, [facility], Binnie Kand    . Biventricular ICD (implantable cardioverter-defibrillator) in place    [place]  . Breast cancer ([place])    . Cancer (H[facility])    Breast cancer-BRACA I gene  . CHF 08/18/2009   Qualifier: Diagnosis of  By: Lynden Ang    . CHF (congestive heart failure) ([place])   . Chronic diastolic CHF (congestive heart failure) ([place]) 04/23/2012  . CIN I (cervical intraepithelial neoplasia I)   . CVA (cerebral vascular accident) ([place]) 01/15/2013  . Essential hypertension 08/18/2009   Qualifier: Diagnosis of  By: Lynden Ang    . Fibroid   . Fibroid uterus 04/15/2013  . 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  The report date is January 21, 1998.   [name] Kitchen HTN (hypertension)   . Long term current use of anticoagulant therapy 01/15/2013  . 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 Stroke ([place])     ROS:   All systems reviewed and negative except as noted in the HPI.   Past Surgical History:  Procedure Laterality Date  . BIV ICD GENERTAOR CHANGE OUT  12/20/2011   [place]  . BIV ICD GENERTAOR CHANGE OUT N/A 12/20/2011   Procedure: BIV ICD GENERTAOR CHANGE OUT;  Surgeon: Evans Lance, MD;  Location[facility] C[facility] LAB;  Service: Cardiovascular;  Laterality: N/A;  . BREAST SURGERY     Bilateral mastctomy and reconstruction TRAM  . CARDIAC DEFIBRILLATOR PLACEMENT    .  CERVICAL CONE BIOPSY    . COLPOSCOPY    . GYNECOLOGIC CRYOSURGERY    . MASTECTOMY    . OOPHORECTOMY     BSO  . TRANSTHORACIC ECHOCARDIOGRAM  08/14/06  . TUBAL LIGATION    . US ECHOCARDIOGRAPHY  06/25/2012   borderline concentric LVH,LV systolic fx mildly reduced,impaired LV relaxation     Family History  Problem Relation Age of Onset  . Breast cancer Sister        Age 16  . Hypertension Sister   . Heart disease Maternal Grandfather   . Clotting disorder Mother   . Cirrhosis Father   . Coronary artery disease Other   . Hypertension Brother      Social History   Socioeconomic History  . Marital status: Divorced    Spouse  name: Not on file  . Number of children: Not on file  . Years of education: Not on file  . Highest education level: Not on file  Occupational History  . Not on file  Social Needs  . Financial resource strain: Not on file  . Food insecurity    Worry: Not on file    Inability: Not on file  . Transportation needs    Medical: Not on file    Non-medical: Not on file  Tobacco Use  . Smoking status: Never Smoker  . Smokeless tobacco: Never Used  Substance and Sexual Activity  . Alcohol use: No    Alcohol/week: 0.0 standard drinks  . Drug use: No  . Sexual activity: Yes    Birth control/protection: Surgical    Comment: interecourse age 64, sexual partners more than 5  Lifestyle  . Physical activity    Days per week: Not on file    Minutes per session: Not on file  . Stress: Not on file  Relationships  . Social Herbalist on phone: Not on file    Gets together: Not on file    Attends religious service: Not on file    Active member of club or organization: Not on file    Attends meetings of clubs or organizations: Not on file    Relationship status: Not on file  . Intimate partner violence    Fear of current or ex partner: Not on file    Emotionally abused: Not on file    Physically abused: Not on file    Forced sexual activity: Not on file  Other Topics Concern  . Not on file  Social History Narrative  . Not on file     BP 128/80   Pulse 69   Ht '5\' 2"'$  (1.575 m)   Wt 191 lb (86.6 kg)   SpO2 96%   BMI 34.93 kg/m   Physical Exam:  Well appearing NAD HEENT: Unremarkable Neck:  No JVD, no thyromegally Lymphatics:  No adenopathy Back:  No CVA tenderness Lungs:  Clear with no wheezes HEART:  Regular rate rhythm, no murmurs, no rubs, no clicks Abd:  soft, positive bowel sounds, no organomegally, no rebound, no guarding Ext:  2 plus pulses, no edema, no cyanosis, no clubbing Skin:  No rashes no nodules Neuro:  CN II through XII intact, motor grossly intact   EKG - P synchronous ventricular pacing  DEVICE  Normal device function.  See PaceArt for details. She is less than a year from ERI.  Assess/Plan: 1. Chronic systolic heart failure - her symptoms are class 2. She has had normalization of her LV function. She is  encouraged to maintain a low sodium diet. 2. HTN - Her blood pressure is well controlled today. She is encouraged to reduce her salt intake. 3. BiV ICD - her tachy therapies are turned off. Her device is working normally.  4. Obesity - I have encouraged the patient to lose 10-15 lbs over the next year. She has actually gained some weight.   [name]

## 2019-08-07 NOTE — Patient Instructions (Signed)
Medication Instructions:  Your physician recommends that you continue on your current medications as directed. Please refer to the Current Medication list given to you today.  Labwork: None ordered.  Testing/Procedures: None ordered.  Follow-Up: Your physician wants you to follow-up in: one year with Dr. Lovena Le.   You will receive a reminder letter in the mail two months in advance. If you don't receive a letter, please call our office to schedule the follow-up appointment.  Remote monitoring is used to monitor your ICD from home. This monitoring reduces the number of office visits required to check your device to one time per year. It allows [place] to keep an eye on the functioning of your device to ensure it is working properly. You are scheduled for a device check from home on 09/08/2019. You may send your transmission at any time that day. If you have a wireless device, the transmission will be sent automatically. After your physician reviews your transmission, you will receive a postcard with your next transmission date.  Any Other Special Instructions Will Be Listed Below (If Applicable).  If you need a refill on your cardiac medications before your next appointment, please call your pharmacy.

## 2019-08-11 NOTE — Progress Notes (Signed)
[facility]  Telephone:(336) [phone] Fax:(336) [phone]     ID: Danielle Mckinney DOB: 30-Oct-1951  MR#: [id number]  XKG#:[id number]  Patient Care Team: Danielle Small, MD as PCP - General (Family Medicine) Danielle Cruel, MD as Consulting Physician (Oncology) Danielle Klein, MD as Consulting Physician (Cardiology) Danielle Mass, MD as Consulting Physician (Gynecology) PCP: Danielle Bellows, MD SU:  OTHER MD:  CHIEF COMPLAINT:  BRCA positive  Breast cancer  CURRENT TREATMENT:  Observation   INTERVAL HISTORY: Danielle Mckinney returns today for follow-up and treatment of her BRCA related breast cancer. She was last seen here on 08/05/2018.   She continues under observation. She is status post bilateral mastectomies, and does not require annual mammography.   Since her last visit here, she underwent a virtual colonoscopy screening CT on 12/07/2018 showing: Mild limitations, primarily involving the sigmoid colon. No evidence of clinically significant colonic polyp or Mckinney. Favor retained fluid/contrast along the left anterior rectal wall. Consider physical exam correlation. Aortic Atherosclerosis (ICD10-I70.0). No acute process in the abdomen or pelvis.   REVIEW OF SYSTEMS: Danielle Mckinney continues to exercise regularly although of course currently the gym is not excessive.  She tells me one of her daughters is moving to town and she is very happy to have her close by.  She has had no unusual headaches visual changes cough phlegm production pleurisy shortness of breath, palpitations, chest pain or pressure, or change in bowel or bladder habits.  She tells me she can climb a flight of stairs without stopping but she will be short of breath at the top.  A detailed review of systems today was otherwise stable.   BREAST CANCER HISTORY: From the earlier summary note:   Danielle Mckinney is referred back by her primary care physician, Dr. Justin Mckinney, because of a rise in her breast cancer tumor marker. To recap : Danielle Mckinney is  status post bilateral breast cancers, not synchronous, and bilateral mastectomies with reconstruction. She had stage IV disease , and was treated aggressively with high-dose chemotherapy and I'll ago his stem cell transplant support at [facility] in 1998. She has remained disease-free since that time , but did have a significant cardiomyopathy, which now appears to have largely resolved. She also had a right body stroke , with minimal residuals.   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 One of her 3 sisters did die from breast cancer at an early age and one of the patient's 2 daughters has been found to carry the mutation.  Her breast cancer history is given in more detail below    PAST MEDICAL HISTORY:  Past Medical History:  Diagnosis Date  . Atrial fibrillation ([place])   . Automatic implantable cardioverter-defibrillator in situ 08/25/2009   Qualifier: Diagnosis of  By: Danielle Le, MD, [facility], Danielle Mckinney    . Biventricular ICD (implantable cardioverter-defibrillator) in place    [place]  . Breast cancer ([place])   . Cancer (HCC)    Breast cancer-BRACA I gene  . CHF 08/18/2009   Qualifier: Diagnosis of  By: Danielle Mckinney    . CHF (congestive heart failure) ([place])   . Chronic diastolic CHF (congestive heart failure) ([place]) 04/23/2012  . CIN I (cervical intraepithelial neoplasia I)   . CVA (cerebral vascular accident) ([place]) 01/15/2013  . Essential hypertension 08/18/2009   Qualifier: Diagnosis of  By: Danielle Mckinney    . Fibroid   .  Fibroid uterus 04/15/2013  . Genetic testing 07/06/2015   BRCA1 c.191G>A (C64Y) pathogenic mutation found at [facility] at [facility].  The report date is January 21, 1998.   [name] Kitchen HTN (hypertension)   . Long term current use of anticoagulant therapy 01/15/2013  . 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 Stroke [facility])      PAST SURGICAL HISTORY:  Past Surgical History:  Procedure Laterality Date  . BIV ICD GENERTAOR CHANGE OUT  12/20/2011   [place]  . BIV ICD GENERTAOR CHANGE OUT N/A 12/20/2011   Procedure: BIV ICD GENERTAOR CHANGE OUT;  Surgeon: Danielle Lance, MD;  Location: [facility] CATH LAB;  Service: Cardiovascular;  Laterality: N/A;  . BREAST SURGERY     Bilateral mastctomy and reconstruction TRAM  . CARDIAC DEFIBRILLATOR PLACEMENT    . CERVICAL CONE BIOPSY    . COLPOSCOPY    . GYNECOLOGIC CRYOSURGERY    . MASTECTOMY    . OOPHORECTOMY     BSO  . TRANSTHORACIC ECHOCARDIOGRAM  08/14/06  . TUBAL LIGATION    . US ECHOCARDIOGRAPHY  06/25/2012   borderline concentric LVH,LV systolic fx mildly reduced,impaired LV relaxation    FAMILY HISTORY Family History  Problem Relation Age of Onset  . Breast cancer Sister        Age 64  . Hypertension Sister   . Heart disease Maternal Grandfather   . Clotting disorder Mother   . Cirrhosis Father   . Coronary artery disease Other   . Hypertension Brother    The patient's father died in his late 54s from cirrhosis of the liver. The patient's mother died at the age of 7 secondary to a blood clot. Danielle Mckinney had 3 brothers, 3 sisters. One sister died from breast cancer. But Danielle Mckinney does not recall the exact age of diagnosis. One paternal aunt died with breast and ovarian cancer. The patient is known to be BRCA positive as is one of her 2 daughters  GYNECOLOGIC HISTORY:  No LMP recorded. Patient is postmenopausal.  menarche age 74, first live birth age 63 as she is Danielle Mckinney. She had a total abdominal hysterectomy with bilateral salpingo-oophorectomy  SOCIAL HISTORY:   she is divorced and lives alone, with no pets. Her daughter Danielle Mckinney moved back to town (September 2018). and younger daughter Danielle Mckinney works as an Therapist, sports in [place]. She proved to be BRCA positive (status post TAH-BSO 2000) The patient has 3 grandchildren. One granddaughter is an Therapist, sports in Engineer, maintenance (IT) at  [employer].    ADVANCED DIRECTIVES:  In place; her daughter Danielle Mckinney is her healthcare power of attorney   HEALTH MAINTENANCE: Social History  Substance Use Topics  . Smoking status: Never Smoker  . Smokeless tobacco: Never Used  . Alcohol use No     Colonoscopy: 2010/[facility]  PAP: July 2016  Bone density: 05/27/2015/normal  Lipid panel: Allergies  Allergen Reactions  . Codeine Nausea And Vomiting  . Lisinopril Cough  . Oxycodone Nausea And Vomiting  . Penicillins Nausea And Vomiting  . Sulfa Antibiotics Nausea And Vomiting    No outpatient medications have been marked as taking for the 08/12/19 encounter (Office Visit) with Magrinat, Virgie Dad, MD.   Current Outpatient Medications  Medication Sig Dispense Refill  . Calcium-Magnesium-Vitamin D (CALCIUM 1200+D3 PO) Take by mouth.    . carvedilol (COREG) 6.25 MG tablet TAKE 1 AND 1/2 TABLETS BY MOUTH TWICE A DAY WITH FOOD 270 tablet 3  . Cholecalciferol (D3-1000) 1000 UNITS tablet  Take 1,000 Units by mouth daily.    . furosemide (LASIX) 40 MG tablet TAKE 1 TABLET BY MOUTH EVERY DAY AS NEEDED FOR SWELLING 90 tablet 3  . Krill Oil 500 MG CAPS Take 500 mg by mouth daily.    [name] Kitchen losartan (COZAAR) 100 MG tablet TAKE 1/2 TABLET(50 MG) BY MOUTH TWICE DAILY 90 tablet 2  . potassium chloride SA (K-DUR) 20 MEQ tablet take 1 tablet by mouth once daily if needed **TAKE ALONG WITH FUROSEMIDE WHEN NEEDED** 30 tablet 3  . spironolactone (ALDACTONE) 25 MG tablet Take 1 tablet (25 mg total) by mouth daily. 30 tablet 2  . triamcinolone (KENALOG) 0.025 % ointment Apply 1 application topically 2 (two) times daily. 30 g 0  . warfarin (COUMADIN) 4 MG tablet TAKE 1 TO 1 AND 1/2 TABLETS BY MOUTH AS DIRECTED BY COUMADIN CLINIC 135 tablet 1   No current facility-administered medications for this visit.    OBJECTIVE: Middle-aged African-American woman who appears well  Vitals:   08/12/19 1455  BP: (!) 136/56  Pulse: 67  Resp: 18  Temp: 97.8 F (36.6  C)  SpO2: 97%   Wt Readings from Last 3 Encounters:  08/12/19 189 lb 1.6 oz (85.8 kg)  08/07/19 191 lb (86.6 kg)  09/05/18 182 lb 12.8 oz (82.9 kg)   Body Mckinney index is 34.59 kg/m.    ECOG FS:1 - Symptomatic but completely ambulatory  Ocular: Sclerae unicteric, pupils round and equal Ear-nose-throat: Wearing a mask Lymphatic: No cervical or supraclavicular adenopathy Lungs no rales or rhonchi Heart regular rate and rhythm Abd soft, nontender, positive bowel sounds MSK no focal spinal tenderness, no joint edema Neuro: non-focal, with clear speech but mild word finding difficulties, well-oriented, appropriate affect Breasts: Status post bilateral mastectomies with bilateral TRAM reconstruction.  There is no evidence of disease recurrence.  Both axillae are benign.   LAB RESULTS:  CMP     Component Value Date/Time   NA 139 08/05/2019 1447   NA 139 07/12/2017 1445   K 4.0 08/05/2019 1447   K 4.2 07/12/2017 1445   CL 105 08/05/2019 1447   CO2 24 08/05/2019 1447   CO2 26 07/12/2017 1445   GLUCOSE 108 (H) 08/05/2019 1447   GLUCOSE 103 07/12/2017 1445   BUN 21 08/05/2019 1447   BUN 12.7 07/12/2017 1445   CREATININE 1.04 (H) 08/05/2019 1447   CREATININE 0.8 07/12/2017 1445   CALCIUM 9.8 08/05/2019 1447   CALCIUM 9.9 07/12/2017 1445   PROT 7.6 08/05/2019 1447   PROT 7.3 07/12/2017 1445   ALBUMIN 4.1 08/05/2019 1447   ALBUMIN 3.9 07/12/2017 1445   AST 23 08/05/2019 1447   AST 19 07/12/2017 1445   ALT 13 08/05/2019 1447   ALT 8 07/12/2017 1445   ALKPHOS 62 08/05/2019 1447   ALKPHOS 61 07/12/2017 1445   BILITOT 0.6 08/05/2019 1447   BILITOT 0.54 07/12/2017 1445   GFRNONAA 56 (L) 08/05/2019 1447   GFRAA >60 08/05/2019 1447    CBC    Component Value Date/Time   WBC 8.0 08/05/2019 1447   RBC 4.69 08/05/2019 1447   HGB 12.5 08/05/2019 1447   HGB 12.5 07/17/2017 1549   HGB 12.2 07/12/2017 1445   HCT 37.7 08/05/2019 1447   HCT 37.2 07/17/2017 1549   HCT 36.8  07/12/2017 1445   PLT 223 08/05/2019 1447   PLT 230 07/17/2017 1549   MCV 80.4 08/05/2019 1447   MCV 78 (L) 07/17/2017 1549   MCV 79.4 (L) 07/12/2017  1445   MCH 26.7 08/05/2019 1447   MCHC 33.2 08/05/2019 1447   RDW 15.3 08/05/2019 1447   RDW 16.0 (H) 07/17/2017 1549   RDW 15.9 (H) 07/12/2017 1445   LYMPHSABS 2.9 08/05/2019 1447   LYMPHSABS 2.4 07/17/2017 1549   LYMPHSABS 2.2 07/12/2017 1445   MONOABS 0.8 08/05/2019 1447   MONOABS 0.7 07/12/2017 1445   EOSABS 0.2 08/05/2019 1447   EOSABS 0.1 07/17/2017 1549   BASOSABS 0.0 08/05/2019 1447   BASOSABS 0.0 07/17/2017 1549   BASOSABS 0.0 07/12/2017 1445       Chemistry    CMP Latest Ref Rng & Units 08/05/2019 07/11/2018 07/12/2017  Glucose 70 - 99 mg/dL 108(H) 106(H) 103  BUN 8 - 23 mg/dL 21 13 12.7  Creatinine 0.44 - 1.00 mg/dL 1.04(H) 0.82 0.8  Sodium 135 - 145 mmol/L 139 140 139  Potassium 3.5 - 5.1 mmol/L 4.0 4.1 4.2  Chloride 98 - 111 mmol/L 105 106 -  CO2 22 - 32 mmol/L _0 Calcium 8.9 - 10.3 mg/dL 9.8 9.8 9.9  Total Protein 6.5 - 8.1 g/dL 7.6 7.2 7.3  Total Bilirubin 0.3 - 1.2 mg/dL 0.6 0.8 0.54  Alkaline Phos 38 - 126 U/L 62 63 61  AST 15 - 41 U/L _1 ALT 0 - 44 U/L _2 Urinalysis    Component Value Date/Time   COLORURINE YELLOW 08/08/2008 1859   APPEARANCEUR CLEAR 08/08/2008 1859   LABSPEC 1.012 08/08/2008 1859   PHURINE 6.0 08/08/2008 1859   GLUCOSEU NEGATIVE 08/08/2008 1859   HGBUR NEGATIVE 08/08/2008 1859   BILIRUBINUR NEGATIVE 08/08/2008 1859   KETONESUR NEGATIVE 08/08/2008 1859   PROTEINUR NEGATIVE 08/08/2008 1859   UROBILINOGEN 0.2 08/08/2008 1859   NITRITE NEGATIVE 08/08/2008 1859   LEUKOCYTESUR  08/08/2008 1859    NEGATIVE MICROSCOPIC NOT DONE ON URINES WITH NEGATIVE PROTEIN, BLOOD, LEUKOCYTES, NITRITE, OR GLUCOSE <1000 mg/dL.     STUDIES: No results found.   ASSESSMENT: 68 y.o. BRCA positive [place] woman  (1) s/p Right lumpectomy in 1990 for a Stage II breast cancer  treated with doxorubicin, cyclophosphamide and 5- fluorouracil, followed by radiation.  (2) Left lumpectomy in April of 1998 for a "Stage IV" tumor (she had left supraclavicular involvement which currently would be staged as III-C),  Estrogen and progesterone receptor negative, HER2 unknown  (a) treated with doxorubicin and paclitaxel followed by high-dose chemotherapy at [facility] with stem cell rescue  in December of 1998   (b) received radiation to the left supraclavicular, left axillary and left chest wall areas, completed March of 1999   (3) BRCA positivity:  (a) s/p bilateral mastectomies with flap reconstruction January 2000 with no evidence of residual disease.  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  (a)  on lifelong anticoagulation  (6) alpha thalassemia trait, with an MCV in the high 70s chronically   PLAN:  Kurstyn is now 22 years out from definitive surgery for her locally advanced breast cancer, with no evidence of disease recurrence.  This is very favorable.  She continues to do an excellent job of keeping herself at keeping herself fit.  She is taking appropriate pandemic precautions  She will  return to see [place] in 1 year.  She knows to call for any other issue that may develop before the next visit.  Magrinat, Virgie Dad, MD  08/12/19 3:29 PM Medical Oncology and Hematology [facility] [place], [place] [address] Tel. [phone]    Fax. [phone]  I, Jacqualyn Posey am acting as a Education administrator for Danielle Cruel, MD.   I, Lurline Del MD, have reviewed the above documentation for accuracy and completeness, and I agree with the above.

## 2019-08-12 ENCOUNTER — Other Ambulatory Visit: Payer: Self-pay

## 2019-08-12 ENCOUNTER — Inpatient Hospital Stay (HOSPITAL_BASED_OUTPATIENT_CLINIC_OR_DEPARTMENT_OTHER): Payer: Medicare Other | Admitting: Oncology

## 2019-08-12 VITALS — BP 136/56 | HR 67 | Temp 97.8°F | Resp 18 | Ht 62.0 in | Wt 189.1 lb

## 2019-08-12 DIAGNOSIS — C50912 Malignant neoplasm of unspecified site of left female breast: Secondary | ICD-10-CM | POA: Diagnosis not present

## 2019-08-12 DIAGNOSIS — Z1501 Genetic susceptibility to malignant neoplasm of breast: Secondary | ICD-10-CM

## 2019-08-12 DIAGNOSIS — Z853 Personal history of malignant neoplasm of breast: Secondary | ICD-10-CM | POA: Diagnosis not present

## 2019-08-13 ENCOUNTER — Telehealth: Payer: Self-pay | Admitting: Oncology

## 2019-08-13 NOTE — Telephone Encounter (Signed)
I talk with patient regarding schedule  

## 2019-08-15 NOTE — Progress Notes (Signed)
Remote pacemaker transmission.   

## 2019-08-28 ENCOUNTER — Other Ambulatory Visit: Payer: Self-pay

## 2019-08-29 ENCOUNTER — Ambulatory Visit (INDEPENDENT_AMBULATORY_CARE_PROVIDER_SITE_OTHER): Payer: Medicare Other | Admitting: Obstetrics & Gynecology

## 2019-08-29 ENCOUNTER — Encounter: Payer: Self-pay | Admitting: Obstetrics & Gynecology

## 2019-08-29 VITALS — BP 130/80 | Ht 62.0 in | Wt 189.0 lb

## 2019-08-29 DIAGNOSIS — Z9889 Other specified postprocedural states: Secondary | ICD-10-CM | POA: Diagnosis not present

## 2019-08-29 DIAGNOSIS — Z6834 Body mass index (BMI) 34.0-34.9, adult: Secondary | ICD-10-CM

## 2019-08-29 DIAGNOSIS — Z1501 Genetic susceptibility to malignant neoplasm of breast: Secondary | ICD-10-CM

## 2019-08-29 DIAGNOSIS — Z780 Asymptomatic menopausal state: Secondary | ICD-10-CM

## 2019-08-29 DIAGNOSIS — Z13820 Encounter for screening for osteoporosis: Secondary | ICD-10-CM

## 2019-08-29 DIAGNOSIS — Z853 Personal history of malignant neoplasm of breast: Secondary | ICD-10-CM

## 2019-08-29 DIAGNOSIS — Z01419 Encounter for gynecological examination (general) (routine) without abnormal findings: Secondary | ICD-10-CM | POA: Diagnosis not present

## 2019-08-29 DIAGNOSIS — C50912 Malignant neoplasm of unspecified site of left female breast: Secondary | ICD-10-CM

## 2019-08-29 DIAGNOSIS — E6609 Other obesity due to excess calories: Secondary | ICD-10-CM

## 2019-08-29 NOTE — Progress Notes (Signed)
Danielle Mckinney 1950-12-26 [id number]   History:    68 y.o. G3P2A1L2 Divorced.  Single.  RP: Established patient presenting for annual gyn exam   HPI: Menopause. No HRT. No PMB.  No pelvic pain. H/O LEEP in 2010. 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 S/P Bilateral Mastectomy/Reconstruction and Bilateral Oophorectomy.  H/O Heart Failure/Pace Maker. H/O Stroke.  BMI 34.57. Exercising 5 times a week. Mictions/BMs wnl.  Health labs with Fam MD.  Past medical history,surgical history, family history and social history were all reviewed and documented in the EPIC chart.  Gynecologic History No LMP recorded. Patient is postmenopausal. Contraception: abstinence and post menopausal status Last Pap: 07/2018. Results were: Negative Last mammogram: Bilateral Mastectomy.  Chest X-Ray Stable 07/2018. Bone Density: 04/2015 Normal Colonoscopy: 10 yrs ago  Obstetric History OB History  Gravida Para Term Preterm AB Living  '3 2 2   1 2  '$ SAB TAB Ectopic Multiple Live Births               # Outcome Date GA Lbr Len/2nd Weight Sex Delivery Anes PTL Lv  3 AB           2 Term           1 Term              ROS: A ROS was performed and pertinent positives and negatives are included in the history.  GENERAL: No fevers or chills. HEENT: No change in vision, no earache, sore throat or sinus congestion. NECK: No pain or stiffness. CARDIOVASCULAR: No chest pain or pressure. No palpitations. PULMONARY: No shortness of breath, cough or wheeze. GASTROINTESTINAL: No abdominal pain, nausea, vomiting or diarrhea, melena or bright red blood per rectum. GENITOURINARY: No urinary frequency, urgency, hesitancy or dysuria. MUSCULOSKELETAL: No joint or muscle pain, no back pain, no recent trauma. DERMATOLOGIC: No rash, no itching, no lesions. ENDOCRINE: No polyuria, polydipsia, no heat or cold intolerance. No recent change in weight. HEMATOLOGICAL: No anemia or easy bruising or bleeding. NEUROLOGIC:  No headache, seizures, numbness, tingling or weakness. PSYCHIATRIC: No depression, no loss of interest in normal activity or change in sleep pattern.     Exam:   BP 130/80 (BP Location: Right Arm, Patient Position: Sitting, Cuff Size: Normal)   Ht '5\' 2"'$  (1.575 m)   Wt 189 lb (85.7 kg)   BMI 34.57 kg/m   Body mass index is 34.57 kg/m.  General appearance : Well developed well nourished female. No acute distress HEENT: Eyes: no retinal hemorrhage or exudates,  Neck supple, trachea midline, no carotid bruits, no thyroidmegaly Lungs: Clear to auscultation, no rhonchi or wheezes, or rib retractions  Heart: Regular rate and rhythm, no murmurs or gallops Breast:Examined in sitting and supine position were symmetrical in appearance, s/p Bilateral Mastectomy with reconstruction, no palpable masses or tenderness,  no skin retraction, no nipple inversion, no nipple discharge, no skin discoloration, no axillary or supraclavicular lymphadenopathy Abdomen: no palpable masses or tenderness, no rebound or guarding Extremities: no edema or skin discoloration or tenderness  Pelvic: Vulva: Normal             Vagina: No gross lesions or discharge  Cervix: No gross lesions or discharge.  Pap reflex done.  Uterus  AV, normal size, shape and consistency, non-tender and mobile  Adnexa  Without masses or tenderness  Anus: Normal   Assessment/Plan:  68 y.o. female for annual exam   1. Encounter for routine gynecological  examination with Papanicolaou smear of cervix Normal gynecologic exam in menopause.  Pap reflex done.  Breast exam status post bilateral mastectomy with reconstruction.  Colonoscopy 10 years ago, will organize a repeat screening colonoscopy now.  Health labs with family physician.  2. H/O LEEP Pap reflex done today.  3. Postmenopausal Well on no hormone replacement therapy.  No postmenopausal bleeding.  4. Cancer of left breast with BRCA1 gene mutation [facility]) Status post bilateral  mastectomy with reconstruction.  Breast exam normal today.  BRCA1 positive.  Patient will continue with chest x-rays per oncology.  5. Screening for osteoporosis Will repeat a BD here at 7 yrs 04/2020.  Continue with vitamin D supplements, calcium intake of 1200 mg daily and regular weightbearing physical activities. - DG Bone Density; Future  6. Class 1 obesity due to excess calories with serious comorbidity and body mass index (BMI) of 34.0 to 34.9 in adult Recommend a lower calorie/carb diet such as [employer].  Aerobic physical activities as recommended by cardio and light weightlifting.  Princess Bruins MD, 3:49 PM 08/29/2019

## 2019-09-01 NOTE — Patient Instructions (Signed)
1. Encounter for routine gynecological examination with Papanicolaou smear of cervix Normal gynecologic exam in menopause.  Pap reflex done.  Breast exam status post bilateral mastectomy with reconstruction.  Colonoscopy 10 years ago, will organize a repeat screening colonoscopy now.  Health labs with family physician.  2. H/O LEEP Pap reflex done today.  3. Postmenopausal Well on no hormone replacement therapy.  No postmenopausal bleeding.  4. Cancer of left breast with BRCA1 gene mutation [facility]) Status post bilateral mastectomy with reconstruction.  Breast exam normal today.  BRCA1 positive.  Patient will continue with chest x-rays per oncology.  5. Screening for osteoporosis Will repeat a BD here at 7 yrs 04/2020.  Continue with vitamin D supplements, calcium intake of 1200 mg daily and regular weightbearing physical activities. - DG Bone Density; Future  6. Class 1 obesity due to excess calories with serious comorbidity and body mass index (BMI) of 34.0 to 34.9 in adult Recommend a lower calorie/carb diet such as [employer].  Aerobic physical activities as recommended by cardio and light weightlifting.  Danielle Mckinney, it was a pleasure seeing you today!  I will inform you of your results as soon as they are available.

## 2019-09-01 NOTE — Addendum Note (Signed)
Addended by: Lorine Bears on: 09/01/2019 08:24 AM   Modules accepted: Orders

## 2019-09-03 ENCOUNTER — Telehealth: Payer: Self-pay | Admitting: Cardiovascular Disease

## 2019-09-03 LAB — PAP IG W/ RFLX HPV ASCU

## 2019-09-03 LAB — HUMAN PAPILLOMAVIRUS, HIGH RISK: HPV DNA High Risk: NOT DETECTED

## 2019-09-03 NOTE — Telephone Encounter (Signed)
Returned the call to the patient. She has a Coumadin appointment on 11/12 so her appointment has been cancelled on 11/23 and she will keep her appointment on 11/12 with Dr. Sallyanne Kuster.

## 2019-09-03 NOTE — Telephone Encounter (Signed)
Patient would like to know why she has two appt his month with Dr. Sallyanne Kuster. She one appt on 11/12 and one on 11/23.

## 2019-09-08 ENCOUNTER — Ambulatory Visit (INDEPENDENT_AMBULATORY_CARE_PROVIDER_SITE_OTHER): Payer: Medicare Other

## 2019-09-08 DIAGNOSIS — Z95810 Presence of automatic (implantable) cardiac defibrillator: Secondary | ICD-10-CM | POA: Diagnosis not present

## 2019-09-08 DIAGNOSIS — I428 Other cardiomyopathies: Secondary | ICD-10-CM

## 2019-09-09 NOTE — Progress Notes (Signed)
Pt informed of Pap result. Will make sure recall in system for 1 year. KW CMA

## 2019-09-10 ENCOUNTER — Telehealth: Payer: Self-pay

## 2019-09-10 NOTE — Progress Notes (Signed)
Looks good. TY

## 2019-09-10 NOTE — Telephone Encounter (Signed)
Remote ICM transmission received.  Attempted call to patient regarding ICM remote transmission and left detailed message per DPR.  Advised to return call for any fluid symptoms or questions. Next ICM remote transmission scheduled 10/13/2019.

## 2019-09-10 NOTE — Progress Notes (Signed)
EPIC Encounter for ICM Monitoring  Patient Name: Danielle Mckinney is a 68 y.o. female Date: 09/10/2019 Primary Care Physican: Maurice Small, MD Primary Cardiologist: [place] Electrophysiologist: Santina Evans Pacing: 92% 10/30/2020Weight:189lbs (office weight)  Battery Longevity: ~ ERI 3.83months   Attempted call to patient and unable to reach.  Left detailed message per DPR regarding transmission. Transmission reviewed.   CorvueThoracic impedance normal but suggestive of possible fluid accumulation 10/31 - 11/5.  Prescribed:Furosemide 40 mg 1 tablet as needed and Potassium 20 mEq 1 tablet to be taken when taking Furosemide.  Recommendations: Left voice mail with ICM number and encouraged to call if experiencing any fluid symptoms..  Follow-up plan: ICM clinic phone appointment on 10/13/2019.   91 day device clinic remote transmission 11/05/2019.  Office appt 09/11/2019 with Dr. Sallyanne Kuster.    Copy of ICM check sent to Dr. Lovena Le and Dr Sallyanne Kuster.   3 month ICM trend: 09/08/2019    1 Year ICM trend:       [name], RN 09/10/2019 10:52 AM

## 2019-09-11 ENCOUNTER — Other Ambulatory Visit: Payer: Self-pay

## 2019-09-11 ENCOUNTER — Ambulatory Visit (INDEPENDENT_AMBULATORY_CARE_PROVIDER_SITE_OTHER): Payer: Medicare Other | Admitting: Pharmacist

## 2019-09-11 ENCOUNTER — Ambulatory Visit (INDEPENDENT_AMBULATORY_CARE_PROVIDER_SITE_OTHER): Payer: Medicare Other | Admitting: Cardiovascular Disease

## 2019-09-11 ENCOUNTER — Encounter: Payer: Self-pay | Admitting: Cardiovascular Disease

## 2019-09-11 VITALS — BP 122/69 | HR 68 | Temp 95.1°F | Ht 62.0 in | Wt 187.0 lb

## 2019-09-11 DIAGNOSIS — T82110A Breakdown (mechanical) of cardiac electrode, initial encounter: Secondary | ICD-10-CM | POA: Diagnosis not present

## 2019-09-11 DIAGNOSIS — I480 Paroxysmal atrial fibrillation: Secondary | ICD-10-CM

## 2019-09-11 DIAGNOSIS — E668 Other obesity: Secondary | ICD-10-CM

## 2019-09-11 DIAGNOSIS — I5032 Chronic diastolic (congestive) heart failure: Secondary | ICD-10-CM

## 2019-09-11 DIAGNOSIS — Z7901 Long term (current) use of anticoagulants: Secondary | ICD-10-CM

## 2019-09-11 DIAGNOSIS — I10 Essential (primary) hypertension: Secondary | ICD-10-CM

## 2019-09-11 DIAGNOSIS — Z95810 Presence of automatic (implantable) cardiac defibrillator: Secondary | ICD-10-CM

## 2019-09-11 LAB — POCT INR: INR: 2.5 (ref 2.0–3.0)

## 2019-09-11 NOTE — Patient Instructions (Signed)

## 2019-09-11 NOTE — Progress Notes (Signed)
Cardiology Office Note    Date:  09/13/2019   ID:  Danielle Mckinney, DOB 1951-05-11, MRN [id number]  PCP:  Maurice Small, MD  Cardiologist:  Cristopher Peru, M.D.; Sanda Klein, MD   No chief complaint on file.   History of Present Illness:  Danielle Mckinney is a 68 y.o. 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  Danielle Mckinney is doing quite well.  Although she is not able to go to the gym anymore or perform water aerobics, she is participating in 1 hour aerobics classes once a week at the gym parking lot and does line dancing in the park 1 hour a week as well.  The patient specifically denies any chest pain at rest exertion, dyspnea at rest or with exertion, orthopnea, paroxysmal nocturnal dyspnea, syncope, palpitations, focal neurological deficits, intermittent claudication, lower extremity edema, unexplained weight gain, cough, hemoptysis or wheezing.  She takes her loop diuretic only as needed, and this generally happens no more than once a week.  She has a [place] CRT-D device implanted in 2013, followed by Dr. Lovena Le.  She had a comprehensive device check on October 8 that showed virtually 100% biventricular pacing.  Her OptiVol has not to deviate from baseline in the last 12 months.  Lead parameters were excellent.  Estimated time to ERI is approximately 6 months  VT therapies were disabled in June 2018 due to noise artifact seen on the ventricular channel (RV lead St. Jude Riata 2008). 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   Past Medical History:  Diagnosis Date  . Atrial fibrillation ([place])   . Automatic implantable cardioverter-defibrillator in situ 08/25/2009   Qualifier: Diagnosis of  By: Lovena Le, MD, [facility], Binnie Kand    . Biventricular ICD  (implantable cardioverter-defibrillator) in place    St.Jude  . Breast cancer ([place])   . Cancer (HCC)    Breast cancer-BRACA I gene  . CHF 08/18/2009   Qualifier: Diagnosis of  By: Lynden Ang    . CHF (congestive heart failure) ([place])   . Chronic diastolic CHF (congestive heart failure) ([place]) 04/23/2012  . CIN I (cervical intraepithelial neoplasia I)   . CVA (cerebral vascular accident) ([place]) 01/15/2013  . Essential hypertension 08/18/2009   Qualifier: Diagnosis of  By: Lynden Ang    . Fibroid   . Fibroid uterus 04/15/2013  . 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  The report date is January 21, 1998.   [name] Kitchen HTN (hypertension)   . Long term current use of anticoagulant therapy 01/15/2013  . Nonischemic cardiomyopathy ([place])    related to anthracycline chemotherapy  . Stroke [facility])     Past Surgical History:  Procedure Laterality Date  . BIV ICD GENERTAOR CHANGE OUT  12/20/2011   St.Jude  . BIV ICD GENERTAOR CHANGE OUT N/A 12/20/2011   Procedure: BIV ICD GENERTAOR CHANGE OUT;  Surgeon: Evans Lance, MD;  Location: [facility] CATH LAB;  Service: Cardiovascular;  Laterality: N/A;  . BREAST SURGERY     Bilateral mastctomy and reconstruction TRAM  . CARDIAC DEFIBRILLATOR PLACEMENT    . CERVICAL CONE BIOPSY    . COLPOSCOPY    . GYNECOLOGIC CRYOSURGERY    . MASTECTOMY    . OOPHORECTOMY     BSO  . TRANSTHORACIC  ECHOCARDIOGRAM  08/14/06  . TUBAL LIGATION    . US ECHOCARDIOGRAPHY  06/25/2012   borderline concentric LVH,LV systolic fx mildly reduced,impaired LV relaxation    Current Medications: Outpatient Medications Prior to Visit  Medication Sig Dispense Refill  . Calcium-Magnesium-Vitamin D (CALCIUM 1200+D3 PO) Take by mouth.    . carvedilol (COREG) 6.25 MG tablet TAKE 1 AND 1/2 TABLETS BY MOUTH TWICE A DAY WITH FOOD 270 tablet 3  . Cholecalciferol (D3-1000) 1000 UNITS tablet Take 1,000 Units by mouth  daily.    . furosemide (LASIX) 40 MG tablet TAKE 1 TABLET BY MOUTH EVERY DAY AS NEEDED FOR SWELLING 90 tablet 3  . Krill Oil 500 MG CAPS Take 500 mg by mouth daily.    [name] Kitchen losartan (COZAAR) 100 MG tablet TAKE 1/2 TABLET(50 MG) BY MOUTH TWICE DAILY 90 tablet 2  . potassium chloride SA (K-DUR) 20 MEQ tablet take 1 tablet by mouth once daily if needed **TAKE ALONG WITH FUROSEMIDE WHEN NEEDED** 30 tablet 3  . spironolactone (ALDACTONE) 25 MG tablet Take 1 tablet (25 mg total) by mouth daily. 30 tablet 2  . triamcinolone (KENALOG) 0.025 % ointment Apply 1 application topically 2 (two) times daily. 30 g 0  . warfarin (COUMADIN) 4 MG tablet TAKE 1 TO 1 AND 1/2 TABLETS BY MOUTH AS DIRECTED BY COUMADIN CLINIC 135 tablet 1   No facility-administered medications prior to visit.      Allergies:   Codeine, Lisinopril, Oxycodone, Penicillins, and Sulfa antibiotics   Social History   Socioeconomic History  . Marital status: Divorced    Spouse name: Not on file  . Number of children: Not on file  . Years of education: Not on file  . Highest education level: Not on file  Occupational History  . Not on file  Social Needs  . Financial resource strain: Not on file  . Food insecurity    Worry: Not on file    Inability: Not on file  . Transportation needs    Medical: Not on file    Non-medical: Not on file  Tobacco Use  . Smoking status: Never Smoker  . Smokeless tobacco: Never Used  Substance and Sexual Activity  . Alcohol use: No    Alcohol/week: 0.0 standard drinks  . Drug use: No  . Sexual activity: Not Currently    Birth control/protection: Surgical    Comment: interecourse age 59, sexual partners more than 5  Lifestyle  . Physical activity    Days per week: Not on file    Minutes per session: Not on file  . Stress: Not on file  Relationships  . Social Herbalist on phone: Not on file    Gets together: Not on file    Attends religious service: Not on file    Active member  of club or organization: Not on file    Attends meetings of clubs or organizations: Not on file    Relationship status: Not on file  Other Topics Concern  . Not on file  Social History Narrative  . Not on file     Family History:  The patient's family history includes Breast cancer in her sister; Cirrhosis in her father; Clotting disorder in her mother; Coronary artery disease in an other family member; Heart disease in her maternal grandfather; Hypertension in her brother and sister.   ROS:   Please see the history of present illness.    All other systems are reviewed and are negative.  PHYSICAL EXAM:   VS:  BP 122/69   Pulse 68   Temp (!) 95.1 F (35.1 C)   Ht 5' 2" (1.575 m)   Wt 187 lb (84.8 kg)   SpO2 95%   BMI 34.20 kg/m      General: Alert, oriented x3, no distress, mildly-moderately obese Head: no evidence of trauma, PERRL, EOMI, no exophtalmos or lid lag, no myxedema, no xanthelasma; normal ears, nose and oropharynx Neck: normal jugular venous pulsations and no hepatojugular reflux; brisk carotid pulses without delay and no carotid bruits Chest: clear to auscultation, no signs of consolidation by percussion or palpation, normal fremitus, symmetrical and full respiratory excursions Cardiovascular: normal position and quality of the apical impulse, regular rhythm, normal first and second heart sounds, no murmurs, rubs or gallops The defibrillator site appears healthy.  There are some superficial veins in that area suggesting possible subclavian vein occlusion with collateral formation. Abdomen: no tenderness or distention, no masses by palpation, no abnormal pulsatility or arterial bruits, normal bowel sounds, no hepatosplenomegaly Extremities: no clubbing, cyanosis or edema; 2+ radial, ulnar and brachial pulses bilaterally; 2+ right femoral, posterior tibial and dorsalis pedis pulses; 2+ left femoral, posterior tibial and dorsalis pedis pulses; no subclavian or femoral  bruits Neurological: grossly nonfocal Psych: Normal mood and affect   Wt Readings from Last 3 Encounters:  09/11/19 187 lb (84.8 kg)  08/29/19 189 lb (85.7 kg)  08/12/19 189 lb 1.6 oz (85.8 kg)      Studies/Labs Reviewed:   EKG:  EKG is ordered today.  Recent Labs: 08/05/2019: ALT 13; BUN 21; Creatinine, Ser 1.04; Hemoglobin 12.5; Platelets 223; Potassium 4.0; Sodium 139    ASSESSMENT:    1. Chronic diastolic heart failure ([place])   2. Biventricular ICD (implantable cardioverter-defibrillator) in place   3. AICD lead malfunction   4. Paroxysmal atrial fibrillation (HCC)   5. Long term current use of anticoagulant therapy   6. Essential hypertension   7. Moderate obesity     PLAN:  In order of problems listed above:  1. CHF: NYHA functional class I.  Euvolemic both clinically and by thoracic impedance.  Only requires intermittent diuretic doses..  On ARB and carvedilol. 2. CRT-P: Despite having a defibrillator in, due to problems with noise on her lateral leads her device is  programmed as a biventricular pacemaker, with tachycardia therapies turned off.  She is a CRT hyper responder and has had complete recovery of LV systolic function.  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  Lead revision would therefore be associated with increased risk.  Anticipate need to make a decision regarding this early next year, will discuss with Dr. Lovena Le. 3. AFib: It has been at least 3 years since we last saw an episode of atrial fibrillation.  She does have a history of previous embolic stroke.  CHADSVasc 5 (gender, hypertension, heart failure, previous stroke). 4. Warfarin: INR was 2.5 today.  No bleeding problems or falls.   5. HTN: Well-controlled. She did not tolerate higher doses of beta blocker in the past. 6. Obesity: Remains moderately obese, but continues her efforts at physical activity and caloric restriction.    Medication Adjustments/Labs and Tests  Ordered: Current medicines are reviewed at length with the patient today.  Concerns regarding medicines are outlined above.  Medication changes, Labs and Tests ordered today are listed in the Patient Instructions below. Patient Instructions  Medication Instructions:  No changes *If you need a refill on your  cardiac medications before your next appointment, please call your pharmacy*  Lab Work: None ordered If you have labs (blood work) drawn today and your tests are completely normal, you will receive your results only by: [name] Kitchen MyChart Message (if you have MyChart) OR . A paper copy in the mail If you have any lab test that is abnormal or we need to change your treatment, we will call you to review the results.  Testing/Procedures: None ordered  Follow-Up: At [facility], you and your health needs are our priority.  As part of our continuing mission to provide you with exceptional heart care, we have created designated Provider Care Teams.  These Care Teams include your primary Cardiologist (physician) and Advanced Practice Providers (APPs -  Physician Assistants and Nurse Practitioners) who all work together to provide you with the care you need, when you need it.  Your next appointment:   12 months  The format for your next appointment:   In Person  Provider:   Sanda Klein, MD      Signed, Sanda Klein, MD  09/13/2019 6:08 PM    [facility] [place] [place], [place], [place]  [address] Phone: [phone]; Fax: [phone]

## 2019-09-13 ENCOUNTER — Encounter: Payer: Self-pay | Admitting: Cardiovascular Disease

## 2019-09-22 ENCOUNTER — Encounter: Payer: Medicare Other | Admitting: Cardiovascular Disease

## 2019-09-28 ENCOUNTER — Other Ambulatory Visit: Payer: Self-pay | Admitting: Cardiovascular Disease

## 2019-10-13 ENCOUNTER — Ambulatory Visit (INDEPENDENT_AMBULATORY_CARE_PROVIDER_SITE_OTHER): Payer: Medicare Other

## 2019-10-13 DIAGNOSIS — I5032 Chronic diastolic (congestive) heart failure: Secondary | ICD-10-CM

## 2019-10-13 DIAGNOSIS — Z95810 Presence of automatic (implantable) cardiac defibrillator: Secondary | ICD-10-CM

## 2019-10-15 ENCOUNTER — Telehealth: Payer: Self-pay

## 2019-10-15 NOTE — Progress Notes (Signed)
EPIC Encounter for ICM Monitoring  Patient Name: Danielle Mckinney is a 68 y.o. female Date: 10/15/2019 Primary Care Physican: Maurice Small, MD Primary Cardiologist: [place] Electrophysiologist: Santina Evans Pacing: 91% 10/30/2020Weight:189lbs (office weight)  Battery Longevity: ~ ERI <25months   Attempted call to patient and unable to reach.  Left detailed message per DPR regarding transmission. Transmission reviewed.   CorvueThoracic impedance normal but suggestive of possible fluid accumulation 11/14 - 11/22.  Prescribed:Furosemide 40 mg 1 tablet as needed and Potassium 20 mEq 1 tablet to be taken when taking Furosemide.  Recommendations:  Left voice mail with ICM number and encouraged to call if experiencing any fluid symptoms..  Follow-up plan: ICM clinic phone appointment on 11/24/2019.   91 day device clinic remote transmission 11/05/2019.    Copy of ICM check sent to Dr. Lovena Le.  3 month ICM trend: 10/13/2019    1 Year ICM trend:       [name], RN 10/15/2019 9:28 AM

## 2019-10-15 NOTE — Telephone Encounter (Signed)
Remote ICM transmission received.  Attempted call to patient regarding ICM remote transmission and left detailed message per DPR.  Advised to return call for any fluid symptoms or questions. Next ICM remote transmission scheduled 11/24/2019.

## 2019-10-27 ENCOUNTER — Other Ambulatory Visit: Payer: Self-pay

## 2019-10-27 MED ORDER — SPIRONOLACTONE 25 MG PO TABS: 25.0000 mg | ORAL_TABLET | Freq: Every day | ORAL | 2 refills | Status: DC

## 2019-10-30 ENCOUNTER — Telehealth: Payer: Self-pay | Admitting: Emergency Medicine

## 2019-10-30 ENCOUNTER — Ambulatory Visit (INDEPENDENT_AMBULATORY_CARE_PROVIDER_SITE_OTHER): Payer: Medicare Other | Admitting: Pharmacist

## 2019-10-30 ENCOUNTER — Other Ambulatory Visit: Payer: Self-pay

## 2019-10-30 DIAGNOSIS — I480 Paroxysmal atrial fibrillation: Secondary | ICD-10-CM

## 2019-10-30 DIAGNOSIS — I639 Cerebral infarction, unspecified: Secondary | ICD-10-CM

## 2019-10-30 DIAGNOSIS — Z7901 Long term (current) use of anticoagulants: Secondary | ICD-10-CM

## 2019-10-30 LAB — POCT INR: INR: 2.1 (ref 2.0–3.0)

## 2019-10-30 NOTE — Telephone Encounter (Signed)
LMOM. Device at RRT.

## 2019-10-30 NOTE — Telephone Encounter (Signed)
Patient notified that battery is at RRT and that Dr Recardo Evangelist 's office will contact her for date to change out generator.

## 2019-10-30 NOTE — Telephone Encounter (Signed)
CRT-D at [facility]. CRT hyperresponder. EF normalized. VT therapies were disabled in June 2018 due to noise artifact seen on the ventricular channel (RV lead St. Jude Riata 2008). 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  I would recommend "downgrade" to CRT-P.  Carleene Overlie, do you agree?

## 2019-10-30 NOTE — Telephone Encounter (Signed)
Can we please add her on in 3-4 weeks? Need to discuss with Dr. Lovena Le before we decide what device she gets first.

## 2019-11-04 NOTE — Telephone Encounter (Signed)
Appointment made for 12/08/2019 with Dr. Sallyanne Kuster. The patient has verbalized her understanding.

## 2019-11-05 ENCOUNTER — Ambulatory Visit (INDEPENDENT_AMBULATORY_CARE_PROVIDER_SITE_OTHER): Payer: Medicare Other | Admitting: *Deleted

## 2019-11-05 DIAGNOSIS — I428 Other cardiomyopathies: Secondary | ICD-10-CM

## 2019-11-05 LAB — CUP PACEART REMOTE DEVICE CHECK
Battery Remaining Longevity: 0 mo
Battery Voltage: 2.59 V
Brady Statistic AP VP Percent: 14 %
Brady Statistic AP VS Percent: 1.1 %
Brady Statistic AS VP Percent: 77 %
Brady Statistic AS VS Percent: 1.1 %
Brady Statistic RA Percent Paced: 7.3 %
Date Time Interrogation Session: 20210106040017
HighPow Impedance: 48 Ohm
Implantable Lead Implant Date: 20181119
Implantable Lead Implant Date: 20181119
Implantable Lead Location: 753859
Implantable Lead Location: 753860
Implantable Pulse Generator Implant Date: 20130220
Lead Channel Impedance Value: 310 Ohm
Lead Channel Impedance Value: 460 Ohm
Lead Channel Impedance Value: 690 Ohm
Lead Channel Pacing Threshold Amplitude: 0.5 V
Lead Channel Pacing Threshold Amplitude: 0.75 V
Lead Channel Pacing Threshold Amplitude: 0.75 V
Lead Channel Pacing Threshold Pulse Width: 0.5 ms
Lead Channel Pacing Threshold Pulse Width: 0.5 ms
Lead Channel Pacing Threshold Pulse Width: 0.8 ms
Lead Channel Sensing Intrinsic Amplitude: 11.3 mV
Lead Channel Sensing Intrinsic Amplitude: 5 mV
Lead Channel Setting Pacing Amplitude: 1.5 V
Lead Channel Setting Pacing Amplitude: 1.75 V
Lead Channel Setting Pacing Amplitude: 2 V
Lead Channel Setting Pacing Pulse Width: 0.5 ms
Lead Channel Setting Pacing Pulse Width: 0.8 ms
Lead Channel Setting Sensing Sensitivity: 0.5 mV
Pulse Gen Serial Number: 7027976

## 2019-11-10 LAB — CUP PACEART INCLINIC DEVICE CHECK
Date Time Interrogation Session: 20201008140054
Implantable Lead Implant Date: 20181119
Implantable Lead Implant Date: 20181119
Implantable Lead Location: 753859
Implantable Lead Location: 753860
Implantable Pulse Generator Implant Date: 20181119
Pulse Gen Model: 2272
Pulse Gen Serial Number: 7027976

## 2019-11-14 ENCOUNTER — Telehealth: Payer: Self-pay | Admitting: Cardiovascular Disease

## 2019-11-14 NOTE — Telephone Encounter (Signed)
New Message:      Pt wants to know if it will be alright for her to take the COVID Vaccine since she is on Coumadin?

## 2019-11-14 NOTE — Telephone Encounter (Signed)
The patient has been made aware that is it fine to have the vaccine while being on warfarin. She does get the flu shot as well every year. She has been advised that she may also call her PCP or this office back for any further questions.   COVID-19 Vaccine Information can be found at: ShippingScam.co.uk For questions related to vaccine distribution or appointments, please email [email or web address] or call 5348271613.

## 2019-11-28 ENCOUNTER — Telehealth: Payer: Self-pay

## 2019-11-28 MED ORDER — CARVEDILOL 6.25 MG PO TABS: ORAL_TABLET | ORAL | 3 refills | Status: DC

## 2019-11-28 NOTE — Telephone Encounter (Signed)
Not needed

## 2019-11-28 NOTE — Telephone Encounter (Signed)
Refill sent.

## 2019-12-02 ENCOUNTER — Ambulatory Visit (INDEPENDENT_AMBULATORY_CARE_PROVIDER_SITE_OTHER): Payer: Medicare Other

## 2019-12-02 DIAGNOSIS — I5032 Chronic diastolic (congestive) heart failure: Secondary | ICD-10-CM

## 2019-12-02 DIAGNOSIS — Z95810 Presence of automatic (implantable) cardiac defibrillator: Secondary | ICD-10-CM

## 2019-12-03 ENCOUNTER — Ambulatory Visit (INDEPENDENT_AMBULATORY_CARE_PROVIDER_SITE_OTHER): Payer: Medicare Other | Admitting: Pharmacist Clinician (PhC)/ Clinical Pharmacy Specialist

## 2019-12-03 ENCOUNTER — Other Ambulatory Visit: Payer: Self-pay

## 2019-12-03 ENCOUNTER — Encounter: Payer: Self-pay | Admitting: Pharmacist Clinician (PhC)/ Clinical Pharmacy Specialist

## 2019-12-03 DIAGNOSIS — I639 Cerebral infarction, unspecified: Secondary | ICD-10-CM

## 2019-12-03 DIAGNOSIS — Z7901 Long term (current) use of anticoagulants: Secondary | ICD-10-CM

## 2019-12-03 DIAGNOSIS — I480 Paroxysmal atrial fibrillation: Secondary | ICD-10-CM

## 2019-12-03 LAB — POCT INR: INR: 2.3 (ref 2.0–3.0)

## 2019-12-03 NOTE — Patient Instructions (Signed)
Continue taking 1.5 tablets daily except 1 tablet each Monday, Wednesday, and Friday.  Recheck INR in 6 weeks.  

## 2019-12-05 ENCOUNTER — Telehealth: Payer: Self-pay

## 2019-12-05 NOTE — Progress Notes (Signed)
EPIC Encounter for ICM Monitoring  Patient Name: Danielle Mckinney is a 70 y.o. female Date: 12/05/2019 Primary Care Physican: Maurice Small, MD Primary Cardiologist: Croitoru Electrophysiologist: Santina Evans Pacing: 93% Last Weight:189lbs(office weight)  Battery Longevity: ~ ERIreached 10/29/2019   Attempted call to patient and unable to reach. Left detailed message per DPR regarding transmission. Transmission reviewed.  CorvueThoracic impedance normal.  Prescribed:Furosemide 40 mg 1 tablet as needed and Potassium 20 mEq 1 tablet to be taken when taking Furosemide.  Recommendations: Left voice mail with ICM number and encouraged to call if experiencing any fluid symptoms..  Follow-up plan: ICM clinic phone appointment on3/05/2020.91day device clinic remote transmission 02/04/2020. Office visit with Dr Sallyanne Kuster 12/08/2019.  Copy of ICM check sent to Dr.Taylor.  3 month ICM trend: 12/02/2019    1 Year ICM trend:       [name], RN 12/05/2019 11:01 AM

## 2019-12-05 NOTE — Telephone Encounter (Signed)
Remote ICM transmission received.  Attempted call to patient regarding ICM remote transmission and left detailed message per DPR.  Advised to return call for any fluid symptoms or questions.  

## 2019-12-08 ENCOUNTER — Ambulatory Visit (INDEPENDENT_AMBULATORY_CARE_PROVIDER_SITE_OTHER): Payer: Medicare Other | Admitting: Cardiovascular Disease

## 2019-12-08 ENCOUNTER — Other Ambulatory Visit: Payer: Self-pay | Admitting: *Deleted

## 2019-12-08 ENCOUNTER — Encounter: Payer: Self-pay | Admitting: Cardiovascular Disease

## 2019-12-08 ENCOUNTER — Other Ambulatory Visit: Payer: Self-pay

## 2019-12-08 VITALS — BP 149/87 | HR 73 | Temp 97.0°F | Ht 62.0 in | Wt 190.0 lb

## 2019-12-08 DIAGNOSIS — Z01818 Encounter for other preprocedural examination: Secondary | ICD-10-CM | POA: Diagnosis not present

## 2019-12-08 DIAGNOSIS — Z7901 Long term (current) use of anticoagulants: Secondary | ICD-10-CM

## 2019-12-08 DIAGNOSIS — I10 Essential (primary) hypertension: Secondary | ICD-10-CM

## 2019-12-08 DIAGNOSIS — I5032 Chronic diastolic (congestive) heart failure: Secondary | ICD-10-CM | POA: Diagnosis not present

## 2019-12-08 DIAGNOSIS — Z95810 Presence of automatic (implantable) cardiac defibrillator: Secondary | ICD-10-CM | POA: Diagnosis not present

## 2019-12-08 DIAGNOSIS — T82110D Breakdown (mechanical) of cardiac electrode, subsequent encounter: Secondary | ICD-10-CM | POA: Diagnosis not present

## 2019-12-08 DIAGNOSIS — I480 Paroxysmal atrial fibrillation: Secondary | ICD-10-CM

## 2019-12-08 DIAGNOSIS — Z4502 Encounter for adjustment and management of automatic implantable cardiac defibrillator: Secondary | ICD-10-CM

## 2019-12-08 DIAGNOSIS — E668 Other obesity: Secondary | ICD-10-CM

## 2019-12-08 NOTE — Patient Instructions (Signed)
Medication Instructions: Your physician recommends that you continue on your current medications as directed. Please refer to the Current Medication list given to you today.  * If you need a refill on your cardiac medications before your next appointment, please call your pharmacy. *  Labwork: Pre procedure lab work today: BMET,CBC and PT/INR  You will need to have the coronavirus test completed prior to your procedure. An appointment has been made at 2:10 on 12/12/19.  This is a Drive Up Visit at the [facility] [address] Please tell them that you are there for pre procedure testing. Someone will direct you to the appropriate testing line. Stay in your car and someone will be with you shortly.Please make sure to have all other labs completed before this test because you will need to stay quarantined until your procedure.  * Will notify you of abnormal results, otherwise continue current treatment plan.*  Testing/Procedures: Your physician has recommended that you have a pacemaker/defibrillator generator change (battery change). Please follow the instructions below, located under the special instructions section.  Follow-Up: Your physician recommends that you schedule a wound check appointment 10-14 days, after your procedure on 12/16/19, with the device clinic.  Your physician recommends that you schedule a follow up appointment in 91 days, after your procedure on 12/16/19, with Dr. Sallyanne Kuster.  Thank you for choosing [facility]!!      Any Other Special Instructions Will Be Listed Below (If Applicable).    [facility] [place], [address]  [place], [place] [address]  Phone: [phone] Fax: [phone]    Generator Change Procedure Instructions  You are scheduled for a Generator Change (battery change) on  12/16/2019  with Dr. Cristopher Peru.  1. Please arrive at the [facility], Entrance "A"  at [facility]  at  5:30 am on the day of your procedure. (The address is [address]  2. DIET: Do not eat or drink after midnight the night before your procedure.  3. LABS: Your provider would like for you to have the following labs today: CBC, BMET and Pt/INR   You will need to have the coronavirus test completed prior to your procedure. An appointment has been made at 2:10 on 12/12/19. This is a Drive Up Visit at the [facility] [address] Please tell them that you are there for pre procedure testing. Someone will direct you to the appropriate testing line. Stay in your car and someone will be with you shortly. Please make sure to have all other labs completed before this test because you will need to stay quarantined until your procedure.   4. MEDICATIONS:  Hold the Warfarin 2 days prior to the procedure. Hold all other medications that morning.   5.  Plan for an overnight stay.  Bring your insurance cards and a list of you medications.  6.  Wash your chest and neck with surgical scrub the evening before and the morning of your procedure.  Rinse well. Please review the surgical scrub instruction sheet given to you.   7. Your chest will need to be shaved prior to this procedure (if needed). We ask that you do this yourself at home 1 to 2 days before or if uncomfortable/unable to do yourself, then it will be performed by the hospital staff the day of.  * Special note:  Every effort is made to have your procedure done on time.  Occasionally  there are emergencies that present themselves at the hospital that may cause delays.  Please be patient if a delay does occur.                                                                                                              [place] - Preparing For Surgery  Before surgery, you can play an important role. Because skin is not sterile, your skin needs to be as free of germs as possible. You can reduce the number of germs on  your skin by washing with CHG (chlorahexidine gluconate) Soap before surgery.  CHG is an antiseptic cleaner which kills germs and bonds with the skin to continue killing germs even after washing.   Please do not use if you have an allergy to CHG or antibacterial soaps.  If your skin becomes reddened/irritated stop using the CHG.   Do not shave (including legs and underarms) for at least 48 hours prior to first CHG shower.  It is OK to shave your face.  Please follow these instructions carefully:  1.  Shower the night before surgery and the morning of surgery with CHG.  2.  If you choose to wash your hair, wash your hair first as usual with your normal shampoo.  3.  After you shampoo, rinse your hair and body thoroughly to remove the shampoo.  4.  Use CHG as you would any other liquid soap.  You can apply CHG directly to the skin and wash gently with a clean washcloth. 5.  Apply the CHG Soap to your body ONLY FROM THE NECK DOWN.  Do not use on open wounds or open sores.  Avoid contact with your eyes, ears, mouth and genitals (private parts).  Wash genitals (private parts) with your normal soap.  6.  Wash thoroughly, paying special attention to the area where your surgery will be performed.  7.  Thoroughly rise your body with warm water from the neck down.   8.  DO NOT shower/wash with your normal soap after using and rinsing off the CHG soap.  9.  Pat yourself dry with a clean towel.           10.  Wear clean pajamas.           11.  Place clean sheets on your bed the night of your first shower and do not sleep with pets.  Day of Surgery: Do not apply any deodorants/lotions.  Please wear clean clothes to the hospital/surgery center.

## 2019-12-08 NOTE — Progress Notes (Signed)
Cardiology Office Note    Date:  12/08/2019   ID:  Danielle Mckinney, DOB Nov 14, 1950, MRN [id number]  PCP:  Maurice Small, MD  Cardiologist:  Cristopher Peru, M.D.; Sanda Klein, MD   Chief Complaint  Patient presents with  . AICD Problem  . Congestive Heart Failure    History of Present Illness:  Danielle Mckinney is a 69 y.o. 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  The patient specifically denies any chest pain at rest exertion, dyspnea at rest or with exertion, orthopnea, paroxysmal nocturnal dyspnea, syncope, palpitations, focal neurological deficits, intermittent claudication, lower extremity edema, unexplained weight gain, cough, hemoptysis or wheezing.  She continues to require diuretics only intermittently, roughly once a week.  Her defibrillator reached elective replacement indicator roughly 1 month ago. She has a [place] CRT-D device implanted in 2013, followed by Dr. Lovena Le.  She has virtually 100% biventricular pacing.  Thoracic impedance/[place] has been in normal range throughout the last 12 months.  Her VT/VF detection and therapies were disabled in June 2018 due to lead noise artifact (Ong Riata implanted 2008).  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  She has never required VT/VF therapies.  As mentioned EF has recovered this most recent EF 60-65% by echo in June 2015)    Past Medical History:  Diagnosis Date  . Atrial fibrillation ([place])   . Automatic implantable cardioverter-defibrillator in situ 08/25/2009   Qualifier: Diagnosis of  By: Lovena Le, MD, [facility], Binnie Kand    . Biventricular ICD (implantable cardioverter-defibrillator) in place    [place]  . Breast cancer ([place])   . Cancer (HCC)    Breast cancer-BRACA I  gene  . CHF 08/18/2009   Qualifier: Diagnosis of  By: Lynden Ang    . CHF (congestive heart failure) ([place])   . Chronic diastolic CHF (congestive heart failure) ([place]) 04/23/2012  . CIN I (cervical intraepithelial neoplasia I)   . CVA (cerebral vascular accident) ([place]) 01/15/2013  . Essential hypertension 08/18/2009   Qualifier: Diagnosis of  By: Lynden Ang    . Fibroid   . Fibroid uterus 04/15/2013  . 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  The report date is January 21, 1998.   [name] Kitchen HTN (hypertension)   . Long term current use of anticoagulant therapy 01/15/2013  . Nonischemic cardiomyopathy ([place])    related to anthracycline chemotherapy  . Stroke [facility])     Past Surgical History:  Procedure Laterality Date  . BIV ICD GENERTAOR CHANGE OUT  12/20/2011   [place]  . BIV ICD GENERTAOR CHANGE OUT N/A 12/20/2011   Procedure: BIV ICD GENERTAOR CHANGE OUT;  Surgeon: Evans Lance, MD;  Location: [facility] CATH LAB;  Service: Cardiovascular;  Laterality: N/A;  . BREAST SURGERY     Bilateral mastctomy and reconstruction TRAM  . CARDIAC DEFIBRILLATOR PLACEMENT    . CERVICAL CONE BIOPSY    . COLPOSCOPY    . GYNECOLOGIC CRYOSURGERY    . MASTECTOMY    . OOPHORECTOMY     BSO  . TRANSTHORACIC ECHOCARDIOGRAM  08/14/06  . TUBAL LIGATION    . US ECHOCARDIOGRAPHY  06/25/2012   borderline concentric LVH,LV systolic fx mildly reduced,impaired LV relaxation    Current Medications: Outpatient Medications Prior to Visit  Medication  Sig Dispense Refill  . Calcium-Magnesium-Vitamin D (CALCIUM 1200+D3 PO) Take by mouth.    . carvedilol (COREG) 6.25 MG tablet TAKE 1 AND 1/2 TABLETS BY MOUTH TWICE A DAY WITH FOOD 270 tablet 3  . Cholecalciferol (D3-1000) 1000 UNITS tablet Take 1,000 Units by mouth daily.    . furosemide (LASIX) 40 MG tablet TAKE 1 TABLET BY MOUTH EVERY DAY AS NEEDED FOR SWELLING 90 tablet 3  . Krill Oil 500 MG  CAPS Take 500 mg by mouth daily.    [name] Kitchen losartan (COZAAR) 100 MG tablet TAKE 1/2 TABLET(50 MG) BY MOUTH TWICE DAILY 90 tablet 2  . potassium chloride SA (K-DUR) 20 MEQ tablet take 1 tablet by mouth once daily if needed **TAKE ALONG WITH FUROSEMIDE WHEN NEEDED** 30 tablet 3  . spironolactone (ALDACTONE) 25 MG tablet Take 1 tablet (25 mg total) by mouth daily. 30 tablet 2  . triamcinolone (KENALOG) 0.025 % ointment Apply 1 application topically 2 (two) times daily. 30 g 0  . warfarin (COUMADIN) 4 MG tablet TAKE 1 TO 1 AND A HALF TABLET BY MOUTH AS DIRECTED BY COUMADIN CLINIC 135 tablet 1   No facility-administered medications prior to visit.     Allergies:   Codeine, Lisinopril, Oxycodone, Penicillins, and Sulfa antibiotics   Social History   Socioeconomic History  . Marital status: Divorced    Spouse name: Not on file  . Number of children: Not on file  . Years of education: Not on file  . Highest education level: Not on file  Occupational History  . Not on file  Tobacco Use  . Smoking status: Never Smoker  . Smokeless tobacco: Never Used  Substance and Sexual Activity  . Alcohol use: No    Alcohol/week: 0.0 standard drinks  . Drug use: No  . Sexual activity: Not Currently    Birth control/protection: Surgical    Comment: interecourse age 29, sexual partners more than 5  Other Topics Concern  . Not on file  Social History Narrative  . Not on file   Social Determinants of Health   Financial Resource Strain:   . Difficulty of Paying Living Expenses: Not on file  Food Insecurity:   . Worried About Charity fundraiser in the Last Year: Not on file  . Ran Out of Food in the Last Year: Not on file  Transportation Needs:   . Lack of Transportation (Medical): Not on file  . Lack of Transportation (Non-Medical): Not on file  Physical Activity:   . Days of Exercise per Week: Not on file  . Minutes of Exercise per Session: Not on file  Stress:   . Feeling of Stress : Not on file    Social Connections:   . Frequency of Communication with Friends and Family: Not on file  . Frequency of Social Gatherings with Friends and Family: Not on file  . Attends Religious Services: Not on file  . Active Member of Clubs or Organizations: Not on file  . Attends Archivist Meetings: Not on file  . Marital Status: Not on file     Family History:  The patient's family history includes Breast cancer in her sister; Cirrhosis in her father; Clotting disorder in her mother; Coronary artery disease in an other family member; Heart disease in her maternal grandfather; Hypertension in her brother and sister.   ROS:   Please see the history of present illness.    All other systems are reviewed and are negative.  PHYSICAL EXAM:   VS:  BP (!) 149/87   Pulse 73   Temp (!) 97 F (36.1 C)   Ht '5\' 2"'$  (1.575 m)   Wt 190 lb (86.2 kg)   SpO2 97%   BMI 34.75 kg/m       General: Alert, oriented x3, no distress, moderately obese.  Healthy left subclavian defibrillator site Head: no evidence of trauma, PERRL, EOMI, no exophtalmos or lid lag, no myxedema, no xanthelasma; normal ears, nose and oropharynx Neck: normal jugular venous pulsations and no hepatojugular reflux; brisk carotid pulses without delay and no carotid bruits Chest: clear to auscultation, no signs of consolidation by percussion or palpation, normal fremitus, symmetrical and full respiratory excursions Cardiovascular: normal position and quality of the apical impulse, regular rhythm, normal first and second heart sounds, no murmurs, rubs or gallops Abdomen: no tenderness or distention, no masses by palpation, no abnormal pulsatility or arterial bruits, normal bowel sounds, no hepatosplenomegaly Extremities: no clubbing, cyanosis or edema; 2+ radial, ulnar and brachial pulses bilaterally; 2+ right femoral, posterior tibial and dorsalis pedis pulses; 2+ left femoral, posterior tibial and dorsalis pedis pulses; no  subclavian or femoral bruits Neurological: grossly nonfocal Psych: Normal mood and affect   Wt Readings from Last 3 Encounters:  12/08/19 190 lb (86.2 kg)  09/11/19 187 lb (84.8 kg)  08/29/19 189 lb (85.7 kg)      Studies/Labs Reviewed:   EKG:  EKG is ordered today.  It shows atrial paced, ventricular sensed rhythm with a small initial R wave in lead V1.  QTc 468 ms  Recent Labs: 08/05/2019: ALT 13; BUN 21; Creatinine, Ser 1.04; Hemoglobin 12.5; Platelets 223; Potassium 4.0; Sodium 139    ASSESSMENT:    1. Chronic diastolic heart failure ([place])   2. Biventricular automatic implantable cardioverter defibrillator in situ   3. ICD (implantable cardioverter-defibrillator) battery depletion   4. Failure of implantable cardioverter-defibrillator (ICD) lead, subsequent encounter   5. Paroxysmal atrial fibrillation (HCC)   6. Long term current use of anticoagulant   7. Essential hypertension   8. Moderate obesity   9. Pre-op testing     PLAN:  In order of problems listed above:  1. CHF: Euvolemic, NYHA functional class I, on ARB and carvedilol and occasional "as needed" loop diuretic.  We will get an updated EF by echocardiography before her device change out. 2. CRT-D: Device at [facility].  Due to problems with "noise" on her RV sensing port, her device is essentially programmed as a CRT-pacemaker.  She has never required tachycardia therapies.  She is a CRT hyper responder and has had complete recovery of LV systolic function.  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  ICD lead revision would be a complex and potentially dangerous procedure.  Discussed with Dr. Lovena Le: Option to place the LV lead in the RV paced/sensed port at the time of generator change out.  I discussed these options with Mrs. Scogin and it appears to me that she is clearly more in favor of the conservative approach, without lead extraction. 3. AFib: Although she has a remote history of embolic stroke,  its been over 3 years since we last saw atrial fibrillation on her device checks.  CHADSVasc 5 (gender, hypertension, heart failure, previous stroke).  It should be safe to suspend warfarin anticoagulation before her generator change out, without "bridging". 4. Warfarin: INR was 2.3 just last week.  She has not had falls, injuries or serious bleeding problems.  5. HTN: Blood pressure today was unusually high for her.  She reports that at home it is typically in the 117-125/60-70 range. 6. Obesity: Remains moderately obese, but continues her efforts at physical activity and caloric restriction.    Medication Adjustments/Labs and Tests Ordered: Current medicines are reviewed at length with the patient today.  Concerns regarding medicines are outlined above.  Medication changes, Labs and Tests ordered today are listed in the Patient Instructions below. Patient Instructions  Medication Instructions: Your physician recommends that you continue on your current medications as directed. Please refer to the Current Medication list given to you today.  * If you need a refill on your cardiac medications before your next appointment, please call your pharmacy. *  Labwork: Pre procedure lab work today: BMET,CBC and PT/INR  You will need to have the coronavirus test completed prior to your procedure. An appointment has been made at 2:10 on 12/12/19.  This is a Drive Up Visit at the [facility] [address]. Please tell them that you are there for pre procedure testing. Someone will direct you to the appropriate testing line. Stay in your car and someone will be with you shortly.Please make sure to have all other labs completed before this test because you will need to stay quarantined until your procedure.  * Will notify you of abnormal results, otherwise continue current treatment plan.*  Testing/Procedures: Your physician has recommended that you have a pacemaker/defibrillator generator  change (battery change). Please follow the instructions below, located under the special instructions section.  Follow-Up: Your physician recommends that you schedule a wound check appointment 10-14 days, after your procedure on 12/16/19, with the device clinic.  Your physician recommends that you schedule a follow up appointment in 91 days, after your procedure on 12/16/19, with Dr. Sallyanne Kuster.  Thank you for choosing [facility]!!      Any Other Special Instructions Will Be Listed Below (If Applicable).    [facility] [place], [place]  [place],  [address]  Phone: [phone] Fax: [phone]    Generator Change Procedure Instructions  You are scheduled for a Generator Change (battery change) on  12/16/2019  with Dr. Cristopher Peru.  1. Please arrive at the [facility], Entrance "A"  at [facility] at  5:30 am on the day of your procedure. (The address is [address]  2. DIET: Do not eat or drink after midnight the night before your procedure.  3. LABS: Your provider would like for you to have the following labs today: CBC, BMET and Pt/INR   You will need to have the coronavirus test completed prior to your procedure. An appointment has been made at 2:10 on 12/12/19. This is a Drive Up Visit at the [facility] [address] Please tell them that you are there for pre procedure testing. Someone will direct you to the appropriate testing line. Stay in your car and someone will be with you shortly. Please make sure to have all other labs completed before this test because you will need to stay quarantined until your procedure.   4. MEDICATIONS:  Hold the Warfarin 2 days prior to the procedure. Hold all other medications that morning.   5.  Plan for an overnight stay.  Bring your insurance cards and a list of you medications.  6.  Wash your chest and neck with surgical scrub the evening  before and the morning  of your procedure.  Rinse well. Please review the surgical scrub instruction sheet given to you.   7. Your chest will need to be shaved prior to this procedure (if needed). We ask that you do this yourself at home 1 to 2 days before or if uncomfortable/unable to do yourself, then it will be performed by the hospital staff the day of.  * Special note:  Every effort is made to have your procedure done on time.  Occasionally there are emergencies that present themselves at the hospital that may cause delays.  Please be patient if a delay does occur.                                                                                                              [facility] - Preparing For Surgery  Before surgery, you can play an important role. Because skin is not sterile, your skin needs to be as free of germs as possible. You can reduce the number of germs on your skin by washing with CHG (chlorahexidine gluconate) Soap before surgery.  CHG is an antiseptic cleaner which kills germs and bonds with the skin to continue killing germs even after washing.   Please do not use if you have an allergy to CHG or antibacterial soaps.  If your skin becomes reddened/irritated stop using the CHG.   Do not shave (including legs and underarms) for at least 48 hours prior to first CHG shower.  It is OK to shave your face.  Please follow these instructions carefully:  1.  Shower the night before surgery and the morning of surgery with CHG.  2.  If you choose to wash your hair, wash your hair first as usual with your normal shampoo.  3.  After you shampoo, rinse your hair and body thoroughly to remove the shampoo.  4.  Use CHG as you would any other liquid soap.  You can apply CHG directly to the skin and wash gently with a clean washcloth. 5.  Apply the CHG Soap to your body ONLY FROM THE NECK DOWN.  Do not use on open wounds or open sores.  Avoid contact with your eyes, ears, mouth and genitals  (private parts).  Wash genitals (private parts) with your normal soap.  6.  Wash thoroughly, paying special attention to the area where your surgery will be performed.  7.  Thoroughly rise your body with warm water from the neck down.   8.  DO NOT shower/wash with your normal soap after using and rinsing off the CHG soap.  9.  Pat yourself dry with a clean towel.           10.  Wear clean pajamas.           11.  Place clean sheets on your bed the night of your first shower and do not sleep with pets.  Day of Surgery: Do not apply any deodorants/lotions.  Please wear clean clothes to the hospital/surgery center.       Signed, Xabi Wittler,  MD  12/08/2019 3:38 PM    [facility] [address] [place], [place], [place]  [address] Phone: [phone]; Fax: [phone]

## 2019-12-09 ENCOUNTER — Ambulatory Visit (HOSPITAL_COMMUNITY): Payer: Medicare Other | Attending: Cardiology

## 2019-12-09 DIAGNOSIS — I5032 Chronic diastolic (congestive) heart failure: Secondary | ICD-10-CM

## 2019-12-09 LAB — PROTIME-INR
INR: 2.1 — ABNORMAL HIGH (ref 0.9–1.2)
Prothrombin Time: 21.8 s — ABNORMAL HIGH (ref 9.1–12.0)

## 2019-12-09 LAB — CBC WITH DIFFERENTIAL
Basophils Absolute: 0 10*3/uL (ref 0.0–0.2)
Basos: 0 %
EOS (ABSOLUTE): 0.3 10*3/uL (ref 0.0–0.4)
Eos: 4 %
Hematocrit: 37.2 % (ref 34.0–46.6)
Hemoglobin: 12.4 g/dL (ref 11.1–15.9)
Immature Grans (Abs): 0 10*3/uL (ref 0.0–0.1)
Immature Granulocytes: 0 %
Lymphocytes Absolute: 2.4 10*3/uL (ref 0.7–3.1)
Lymphs: 33 %
MCH: 26.3 pg — ABNORMAL LOW (ref 26.6–33.0)
MCHC: 33.3 g/dL (ref 31.5–35.7)
MCV: 79 fL (ref 79–97)
Monocytes Absolute: 0.8 10*3/uL (ref 0.1–0.9)
Monocytes: 11 %
Neutrophils Absolute: 3.8 10*3/uL (ref 1.4–7.0)
Neutrophils: 52 %
RBC: 4.71 x10E6/uL (ref 3.77–5.28)
RDW: 15.3 % (ref 11.7–15.4)
WBC: 7.3 10*3/uL (ref 3.4–10.8)

## 2019-12-09 LAB — BASIC METABOLIC PANEL
BUN/Creatinine Ratio: 21 (ref 12–28)
BUN: 16 mg/dL (ref 8–27)
CO2: 20 mmol/L (ref 20–29)
Calcium: 10.2 mg/dL (ref 8.7–10.3)
Chloride: 103 mmol/L (ref 96–106)
Creatinine, Ser: 0.78 mg/dL (ref 0.57–1.00)
GFR calc Af Amer: 92 mL/min/{1.73_m2} (ref >59)
GFR calc non Af Amer: 79 mL/min/{1.73_m2} (ref >59)
Glucose: 84 mg/dL (ref 65–99)
Potassium: 4.3 mmol/L (ref 3.5–5.2)
Sodium: 141 mmol/L (ref 134–144)

## 2019-12-12 ENCOUNTER — Other Ambulatory Visit (HOSPITAL_COMMUNITY)
Admission: RE | Admit: 2019-12-12 | Discharge: 2019-12-12 | Disposition: A | Discharge status: Home / Self Care | Payer: Medicare Other | Source: Ambulatory Visit | Attending: Internal Medicine | Admitting: Internal Medicine

## 2019-12-12 DIAGNOSIS — Z01812 Encounter for preprocedural laboratory examination: Secondary | ICD-10-CM | POA: Insufficient documentation

## 2019-12-12 DIAGNOSIS — Z20822 Contact with and (suspected) exposure to covid-19: Secondary | ICD-10-CM | POA: Diagnosis not present

## 2019-12-12 LAB — SARS CORONAVIRUS 2 (TAT 6-24 HRS): SARS Coronavirus 2: NEGATIVE

## 2019-12-15 NOTE — Progress Notes (Signed)
Instructed patient on the following items: Arrival time 0530 Nothing to eat or drink after midnight No meds AM of procedure Responsible person to drive you home and stay with you for 24 hrs Wash with special soap night before and morning of procedure If on anti-coagulant drug instructions -stopped Coumadin yesterday.

## 2019-12-16 ENCOUNTER — Other Ambulatory Visit: Payer: Self-pay

## 2019-12-16 ENCOUNTER — Encounter (HOSPITAL_COMMUNITY)
Admission: RE | Disposition: A | Discharge status: Home / Self Care | Payer: Self-pay | Source: Home / Self Care | Attending: Internal Medicine

## 2019-12-16 ENCOUNTER — Ambulatory Visit (HOSPITAL_COMMUNITY)
Admission: RE | Admit: 2019-12-16 | Discharge: 2019-12-16 | Disposition: A | Discharge status: Home / Self Care | Payer: Medicare Other | Attending: Internal Medicine | Admitting: Internal Medicine

## 2019-12-16 DIAGNOSIS — Z9013 Acquired absence of bilateral breasts and nipples: Secondary | ICD-10-CM | POA: Diagnosis not present

## 2019-12-16 DIAGNOSIS — Z006 Encounter for examination for normal comparison and control in clinical research program: Secondary | ICD-10-CM | POA: Insufficient documentation

## 2019-12-16 DIAGNOSIS — Z79899 Other long term (current) drug therapy: Secondary | ICD-10-CM | POA: Diagnosis not present

## 2019-12-16 DIAGNOSIS — Z6834 Body mass index (BMI) 34.0-34.9, adult: Secondary | ICD-10-CM | POA: Diagnosis not present

## 2019-12-16 DIAGNOSIS — I447 Left bundle-branch block, unspecified: Secondary | ICD-10-CM | POA: Insufficient documentation

## 2019-12-16 DIAGNOSIS — Z888 Allergy status to other drugs, medicaments and biological substances status: Secondary | ICD-10-CM | POA: Diagnosis not present

## 2019-12-16 DIAGNOSIS — Z7901 Long term (current) use of anticoagulants: Secondary | ICD-10-CM | POA: Diagnosis not present

## 2019-12-16 DIAGNOSIS — Z4502 Encounter for adjustment and management of automatic implantable cardiac defibrillator: Secondary | ICD-10-CM | POA: Diagnosis not present

## 2019-12-16 DIAGNOSIS — Z8673 Personal history of transient ischemic attack (TIA), and cerebral infarction without residual deficits: Secondary | ICD-10-CM | POA: Diagnosis not present

## 2019-12-16 DIAGNOSIS — I480 Paroxysmal atrial fibrillation: Secondary | ICD-10-CM | POA: Insufficient documentation

## 2019-12-16 DIAGNOSIS — Z8249 Family history of ischemic heart disease and other diseases of the circulatory system: Secondary | ICD-10-CM | POA: Insufficient documentation

## 2019-12-16 DIAGNOSIS — E669 Obesity, unspecified: Secondary | ICD-10-CM | POA: Diagnosis not present

## 2019-12-16 DIAGNOSIS — Z882 Allergy status to sulfonamides status: Secondary | ICD-10-CM | POA: Diagnosis not present

## 2019-12-16 DIAGNOSIS — I5022 Chronic systolic (congestive) heart failure: Secondary | ICD-10-CM | POA: Diagnosis not present

## 2019-12-16 DIAGNOSIS — Z885 Allergy status to narcotic agent status: Secondary | ICD-10-CM | POA: Insufficient documentation

## 2019-12-16 DIAGNOSIS — I428 Other cardiomyopathies: Secondary | ICD-10-CM | POA: Insufficient documentation

## 2019-12-16 DIAGNOSIS — Z880 Allergy status to penicillin: Secondary | ICD-10-CM | POA: Insufficient documentation

## 2019-12-16 DIAGNOSIS — Z90722 Acquired absence of ovaries, bilateral: Secondary | ICD-10-CM | POA: Insufficient documentation

## 2019-12-16 DIAGNOSIS — I5032 Chronic diastolic (congestive) heart failure: Secondary | ICD-10-CM | POA: Diagnosis not present

## 2019-12-16 DIAGNOSIS — I110 Hypertensive heart disease with heart failure: Secondary | ICD-10-CM | POA: Insufficient documentation

## 2019-12-16 HISTORY — PX: BIV PACEMAKER GENERATOR CHANGEOUT: EP1198

## 2019-12-16 LAB — PROTIME-INR
INR: 2.1 — ABNORMAL HIGH (ref 0.8–1.2)
Prothrombin Time: 23.5 seconds — ABNORMAL HIGH (ref 11.4–15.2)

## 2019-12-16 LAB — PLATELET COUNT: Platelets: 232 10*3/uL (ref 150–400)

## 2019-12-16 SURGERY — BIV PACEMAKER GENERATOR CHANGEOUT

## 2019-12-16 MED ORDER — MIDAZOLAM HCL 5 MG/5ML IJ SOLN
INTRAMUSCULAR | Status: AC
  Filled 2019-12-16: qty 5

## 2019-12-16 MED ORDER — SODIUM CHLORIDE 0.9 % IV SOLN: 80.0000 mg | INTRAVENOUS | Status: DC

## 2019-12-16 MED ORDER — LIDOCAINE-EPINEPHRINE 1 %-1:100000 IJ SOLN
INTRAMUSCULAR | Status: AC
  Filled 2019-12-16: qty 1

## 2019-12-16 MED ORDER — SODIUM CHLORIDE 0.9 % IV SOLN
INTRAVENOUS | Status: AC
  Filled 2019-12-16: qty 2

## 2019-12-16 MED ORDER — CHLORHEXIDINE GLUCONATE 4 % EX LIQD: 4.0000 "application " | Freq: Once | CUTANEOUS | Status: DC

## 2019-12-16 MED ORDER — LIDOCAINE HCL (PF) 1 % IJ SOLN
INTRAMUSCULAR | Status: DC | PRN
  Administered 2019-12-16: 60 mL

## 2019-12-16 MED ORDER — VANCOMYCIN HCL IN DEXTROSE 1-5 GM/200ML-% IV SOLN
INTRAVENOUS | Status: AC
  Filled 2019-12-16: qty 200

## 2019-12-16 MED ORDER — LIDOCAINE HCL 1 % IJ SOLN
INTRAMUSCULAR | Status: AC
  Filled 2019-12-16: qty 20

## 2019-12-16 MED ORDER — FENTANYL CITRATE (PF) 100 MCG/2ML IJ SOLN
INTRAMUSCULAR | Status: AC
  Filled 2019-12-16: qty 2

## 2019-12-16 MED ORDER — FENTANYL CITRATE (PF) 100 MCG/2ML IJ SOLN
INTRAMUSCULAR | Status: DC | PRN
  Administered 2019-12-16 (×2): 12.5 ug via INTRAVENOUS

## 2019-12-16 MED ORDER — SODIUM CHLORIDE 0.9 % IV SOLN: INTRAVENOUS | Status: DC

## 2019-12-16 MED ORDER — VANCOMYCIN HCL IN DEXTROSE 1-5 GM/200ML-% IV SOLN
1000.0000 mg | INTRAVENOUS | Status: AC
  Administered 2019-12-16: 1000 mg via INTRAVENOUS

## 2019-12-16 MED ORDER — ONDANSETRON HCL 4 MG/2ML IJ SOLN: 4.0000 mg | Freq: Four times a day (QID) | INTRAMUSCULAR | Status: DC | PRN

## 2019-12-16 MED ORDER — MIDAZOLAM HCL 5 MG/5ML IJ SOLN
INTRAMUSCULAR | Status: DC | PRN
  Administered 2019-12-16 (×2): 1 mg via INTRAVENOUS

## 2019-12-16 MED ORDER — ACETAMINOPHEN 325 MG PO TABS: 325.0000 mg | ORAL_TABLET | ORAL | Status: DC | PRN

## 2019-12-16 SURGICAL SUPPLY — 5 items
CABLE SURGICAL S-101-97-12 (CABLE) ×3 IMPLANT
PACEMAKER ALLR CRT-P RF PM3222 (Pacemaker) ×1 IMPLANT
PAD PRO RADIOLUCENT 2001M-C (PAD) ×3 IMPLANT
PPM ALLURE CRT-P RF PM3222 (Pacemaker) ×3 IMPLANT
TRAY PACEMAKER INSERTION (PACKS) ×3 IMPLANT

## 2019-12-16 NOTE — H&P (Signed)
Cardiology Office Note   Date: 12/08/2019  ID: Danielle Mckinney, DOB 08-02-1951, MRN [id number]  PCP: Maurice Small, MD  Cardiologist: Cristopher Peru, M.D.; Sanda Klein, MD     Chief Complaint  Patient presents with  . AICD Problem  . Congestive Heart Failure   History of Present Illness:  Danielle Mckinney is a 69 y.o. 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  The patient specifically denies any chest pain at rest exertion, dyspnea at rest or with exertion, orthopnea, paroxysmal nocturnal dyspnea, syncope, palpitations, focal neurological deficits, intermittent claudication, lower extremity edema, unexplained weight gain, cough, hemoptysis or wheezing. She continues to require diuretics only intermittently, roughly once a week.  Her defibrillator reached elective replacement indicator roughly 1 month ago. She has a [place] CRT-D device implanted in 2013, followed by Dr. Lovena Le. She has virtually 100% biventricular pacing. Thoracic impedance/[place] has been in normal range throughout the last 12 months. Her VT/VF detection and therapies were disabled in June 2018 due to lead noise artifact ([place] Riata implanted 2008). 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 She has never required VT/VF therapies. As mentioned EF has recovered this most recent EF 60-65% by echo in June 2015)      Past Medical History:  Diagnosis Date  . Atrial fibrillation ([place])   . Automatic implantable cardioverter-defibrillator in situ 08/25/2009   Qualifier: Diagnosis of By: Lovena Le, MD, [facility], Binnie Kand   . Biventricular ICD (implantable cardioverter-defibrillator) in place    [place]  . Breast cancer ([place])   . Cancer (HCC)    Breast cancer-BRACA I gene  . CHF 08/18/2009    Qualifier: Diagnosis of By: Lynden Ang   . CHF (congestive heart failure) ([place])   . Chronic diastolic CHF (congestive heart failure) ([place]) 04/23/2012  . CIN I (cervical intraepithelial neoplasia I)   . CVA (cerebral vascular accident) ([place]) 01/15/2013  . Essential hypertension 08/18/2009   Qualifier: Diagnosis of By: Lynden Ang   . Fibroid   . Fibroid uterus 04/15/2013  . 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 The report date is January 21, 1998.   [name] Kitchen HTN (hypertension)   . Long term current use of anticoagulant therapy 01/15/2013  . Nonischemic cardiomyopathy ([place])    related to anthracycline chemotherapy  . Stroke [facility])         Past Surgical History:  Procedure Laterality Date  . BIV ICD GENERTAOR CHANGE OUT  12/20/2011   [place]  . BIV ICD GENERTAOR CHANGE OUT N/A 12/20/2011   Procedure: BIV ICD GENERTAOR CHANGE OUT; Surgeon: Evans Lance, MD; Location: [facility] CATH LAB; Service: Cardiovascular; Laterality: N/A;  . BREAST SURGERY     Bilateral mastctomy and reconstruction TRAM  . CARDIAC DEFIBRILLATOR PLACEMENT    . CERVICAL CONE BIOPSY    . COLPOSCOPY    . GYNECOLOGIC CRYOSURGERY    . MASTECTOMY    . OOPHORECTOMY     BSO  . TRANSTHORACIC ECHOCARDIOGRAM  08/14/06  . TUBAL LIGATION    . US ECHOCARDIOGRAPHY  06/25/2012   borderline concentric LVH,LV systolic fx mildly reduced,impaired LV relaxation   Current Medications:        Outpatient Medications Prior to Visit  Medication Sig Dispense Refill  . Calcium-Magnesium-Vitamin D (CALCIUM 1200+D3 PO) Take by  mouth.    . carvedilol (COREG) 6.25 MG tablet TAKE 1 AND 1/2 TABLETS BY MOUTH TWICE A DAY WITH FOOD 270 tablet 3  . Cholecalciferol (D3-1000) 1000 UNITS tablet Take 1,000 Units by mouth daily.    . furosemide (LASIX) 40 MG tablet TAKE 1 TABLET BY MOUTH EVERY DAY AS NEEDED FOR SWELLING 90 tablet 3  . Krill Oil 500 MG CAPS Take 500 mg by mouth  daily.    [name] Kitchen losartan (COZAAR) 100 MG tablet TAKE 1/2 TABLET(50 MG) BY MOUTH TWICE DAILY 90 tablet 2  . potassium chloride SA (K-DUR) 20 MEQ tablet take 1 tablet by mouth once daily if needed **TAKE ALONG WITH FUROSEMIDE WHEN NEEDED** 30 tablet 3  . spironolactone (ALDACTONE) 25 MG tablet Take 1 tablet (25 mg total) by mouth daily. 30 tablet 2  . triamcinolone (KENALOG) 0.025 % ointment Apply 1 application topically 2 (two) times daily. 30 g 0  . warfarin (COUMADIN) 4 MG tablet TAKE 1 TO 1 AND A HALF TABLET BY MOUTH AS DIRECTED BY COUMADIN CLINIC 135 tablet 1   No facility-administered medications prior to visit.   Allergies: Codeine, Lisinopril, Oxycodone, Penicillins, and Sulfa antibiotics  Social History        Socioeconomic History  . Marital status: Divorced    Spouse name: Not on file  . Number of children: Not on file  . Years of education: Not on file  . Highest education level: Not on file  Occupational History  . Not on file  Tobacco Use  . Smoking status: Never Smoker  . Smokeless tobacco: Never Used  Substance and Sexual Activity  . Alcohol use: No    Alcohol/week: 0.0 standard drinks  . Drug use: No  . Sexual activity: Not Currently    Birth control/protection: Surgical    Comment: interecourse age 42, sexual partners more than 5  Other Topics Concern  . Not on file  Social History Narrative  . Not on file   Social Determinants of Health      Financial Resource Strain:   . Difficulty of Paying Living Expenses: Not on file  Food Insecurity:   . Worried About Charity fundraiser in the Last Year: Not on file  . Ran Out of Food in the Last Year: Not on file  Transportation Needs:   . Lack of Transportation (Medical): Not on file  . Lack of Transportation (Non-Medical): Not on file  Physical Activity:   . Days of Exercise per Week: Not on file  . Minutes of Exercise per Session: Not on file  Stress:   . Feeling of Stress : Not on file  Social Connections:    . Frequency of Communication with Friends and Family: Not on file  . Frequency of Social Gatherings with Friends and Family: Not on file  . Attends Religious Services: Not on file  . Active Member of Clubs or Organizations: Not on file  . Attends Archivist Meetings: Not on file  . Marital Status: Not on file   Family History: The patient's family history includes Breast cancer in her sister; Cirrhosis in her father; Clotting disorder in her mother; Coronary artery disease in an other family member; Heart disease in her maternal grandfather; Hypertension in her brother and sister.  ROS:  Please see the history of present illness.  All other systems are reviewed and are negative.  PHYSICAL EXAM:   VS: BP (!) 149/87  Pulse 73  Temp (!) 97 F (36.1 C)  Ht '5\' 2"'$  (1.575 m)  Wt 190 lb (86.2 kg)  SpO2 97%  BMI 34.75 kg/m  General: Alert, oriented x3, no distress, moderately obese. Healthy left subclavian defibrillator site  Head: no evidence of trauma, PERRL, EOMI, no exophtalmos or lid lag, no myxedema, no xanthelasma; normal ears, nose and oropharynx  Neck: normal jugular venous pulsations and no hepatojugular reflux; brisk carotid pulses without delay and no carotid bruits  Chest: clear to auscultation, no signs of consolidation by percussion or palpation, normal fremitus, symmetrical and full respiratory excursions  Cardiovascular: normal position and quality of the apical impulse, regular rhythm, normal first and second heart sounds, no murmurs, rubs or gallops  Abdomen: no tenderness or distention, no masses by palpation, no abnormal pulsatility or arterial bruits, normal bowel sounds, no hepatosplenomegaly  Extremities: no clubbing, cyanosis or edema; 2+ radial, ulnar and brachial pulses bilaterally; 2+ right femoral, posterior tibial and dorsalis pedis pulses; 2+ left femoral, posterior tibial and dorsalis pedis pulses; no subclavian or femoral bruits  Neurological: grossly  nonfocal  Psych: Normal mood and affect     Wt Readings from Last 3 Encounters:  12/08/19 190 lb (86.2 kg)  09/11/19 187 lb (84.8 kg)  08/29/19 189 lb (85.7 kg)   Studies/Labs Reviewed:  EKG: EKG is ordered today. It shows atrial paced, ventricular sensed rhythm with a small initial R wave in lead V1. QTc 468 ms  Recent Labs:  08/05/2019: ALT 13; BUN 21; Creatinine, Ser 1.04; Hemoglobin 12.5; Platelets 223; Potassium 4.0; Sodium 139  ASSESSMENT:    1. Chronic diastolic heart failure ([place])   2. Biventricular automatic implantable cardioverter defibrillator in situ   3. ICD (implantable cardioverter-defibrillator) battery depletion   4. Failure of implantable cardioverter-defibrillator (ICD) lead, subsequent encounter   5. Paroxysmal atrial fibrillation (HCC)   6. Long term current use of anticoagulant   7. Essential hypertension   8. Moderate obesity   9. Pre-op testing    PLAN:  In order of problems listed above:  1. CHF: Euvolemic, NYHA functional class I, on ARB and carvedilol and occasional "as needed" loop diuretic. We will get an updated EF by echocardiography before her device change out. 2. CRT-D: Device at ERI. Due to problems with "noise" on her RV sensing port, her device is essentially programmed as a CRT-pacemaker. She has never required tachycardia therapies. She is a CRT hyper responder and has had complete recovery of LV systolic function. 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 ICD lead revision would be a complex and potentially dangerous procedure. Discussed with Dr. Lovena Le: Option to place the LV lead in the RV paced/sensed port at the time of generator change out. I discussed these options with Mrs. Thune and it appears to me that she is clearly more in favor of the conservative approach, without lead extraction. 3. AFib: Although she has a remote history of embolic stroke, its been over 3 years since we last saw atrial fibrillation on her device  checks. CHADSVasc 5 (gender, hypertension, heart failure, previous stroke). It should be safe to suspend warfarin anticoagulation before her generator change out, without "bridging". 4. Warfarin: INR was 2.3 just last week. She has not had falls, injuries or serious bleeding problems.  5. HTN: Blood pressure today was unusually high for her. She reports that at home it is typically in the 117-125/60-70 range. 6. Obesity: Remains moderately obese, but continues her efforts at physical activity and caloric restriction.  EP Attending  Patient  seen and examined. Agree with the findings as noted above. The patient is well known to me as a Biv pacing super responder who has never had therapy but who does have noise on her RV lead. She will undergo down grade today from a biv ICD to a biv PPM. If her LV R waves are good, we will place this lead in the RV port.   [name]

## 2019-12-16 NOTE — Discharge Instructions (Signed)
Post procedure care instructions Keep incision clean and dry for 10 days. No driving for 2 days.  Leave steri-strips (little pieces of tape) on until seen in the office for wound check appointment. Call the office [phone]) for redness, drainage, swelling, or fever.   Pacemaker Battery Change, Care After This sheet gives you information about how to care for yourself after your procedure. Your health care provider may also give you more specific instructions. If you have problems or questions, contact your health care provider. What can I expect after the procedure? After your procedure, it is common to have:  Pain or soreness at the site where the pacemaker was inserted.  Swelling at the site where the pacemaker was inserted. Follow these instructions at home: Incision care   Keep the incision clean and dry. ? Do not take baths, swim, or use a hot tub until your health care provider approves. ? You may shower the day after your procedure, or as directed by your health care provider. ? Pat the area dry with a clean towel. Do not rub the area. This may cause bleeding.  Follow instructions from your health care provider about how to take care of your incision. Make sure you: ? Wash your hands with soap and water before you change your bandage (dressing). If soap and water are not available, use hand sanitizer. ? Change your dressing as told by your health care provider. ? Leave stitches (sutures), skin glue, or adhesive strips in place. These skin closures may need to stay in place for 2 weeks or longer. If adhesive strip edges start to loosen and curl up, you may trim the loose edges. Do not remove adhesive strips completely unless your health care provider tells you to do that.  Check your incision area every day for signs of infection. Check for: ? More redness, swelling, or pain. ? More fluid or blood. ? Warmth. ? Pus or a bad smell. Activity  Do not lift anything that is  heavier than 10 lb (4.5 kg) until your health care provider says it is okay to do so.  For the first 2 weeks, or as long as told by your health care provider: ? Avoid lifting your left arm higher than your shoulder. ? Be gentle when you move your arms over your head. It is okay to raise your arm to comb your hair. ? Avoid strenuous exercise.  Ask your health care provider when it is okay to: ? Resume your normal activities. ? Return to work or school. ? Resume sexual activity. Eating and drinking  Eat a heart-healthy diet. This should include plenty of fresh fruits and vegetables, whole grains, low-fat dairy products, and lean protein like chicken and fish.  Limit alcohol intake to no more than 1 drink a day for non-pregnant women and 2 drinks a day for men. One drink equals 12 oz of beer, 5 oz of wine, or 1 oz of hard liquor.  Check ingredients and nutrition facts on packaged foods and beverages. Avoid the following types of food: ? Food that is high in salt (sodium). ? Food that is high in saturated fat, like full-fat dairy or red meat. ? Food that is high in trans fat, like fried food. ? Food and drinks that are high in sugar. Lifestyle  Do not use any products that contain nicotine or tobacco, such as cigarettes and e-cigarettes. If you need help quitting, ask your health care provider.  Take steps to manage and control  your weight.  Get regular exercise. Aim for 150 minutes of moderate-intensity exercise (such as walking or yoga) or 75 minutes of vigorous exercise (such as running or swimming) each week.  Manage other health problems, such as diabetes or high blood pressure. Ask your health care provider how you can manage these conditions. General instructions  Do not drive for 24 hours after your procedure if you were given a medicine to help you relax (sedative).  Take over-the-counter and prescription medicines only as told by your health care provider.  Avoid putting  pressure on the area where the pacemaker was placed.  If you need an MRI after your pacemaker has been placed, be sure to tell the health care provider who orders the MRI that you have a pacemaker.  Avoid close and prolonged exposure to electrical devices that have strong magnetic fields. These include: ? Cell phones. Avoid keeping them in a pocket near the pacemaker, and try using the ear opposite the pacemaker. ? MP3 players. ? Household appliances, like microwaves. ? Metal detectors. ? Electric generators. ? High-tension wires.  Keep all follow-up visits as directed by your health care provider. This is important. Contact a health care provider if:  You have pain at the incision site that is not relieved by over-the-counter or prescription medicines.  You have any of these around your incision site or coming from it: ? More redness, swelling, or pain. ? Fluid or blood. ? Warmth to the touch. ? Pus or a bad smell.  You have a fever.  You feel brief, occasional palpitations, light-headedness, or any symptoms that you think might be related to your heart. Get help right away if:  You experience chest pain that is different from the pain at the pacemaker site.  You develop a red streak that extends above or below the incision site.  You experience shortness of breath.  You have palpitations or an irregular heartbeat.  You have light-headedness that does not go away quickly.  You faint or have dizzy spells.  Your pulse suddenly drops or increases rapidly and does not return to normal.  You begin to gain weight and your legs and ankles swell. Summary  After your procedure, it is common to have pain, soreness, and some swelling where the pacemaker was inserted.  Make sure to keep your incision clean and dry. Follow instructions from your health care provider about how to take care of your incision.  Check your incision every day for signs of infection, such as more pain  or swelling, pus or a bad smell, warmth, or leaking fluid and blood.  Avoid strenuous exercise and lifting your left arm higher than your shoulder for 2 weeks, or as long as told by your health care provider. This information is not intended to replace advice given to you by your health care provider. Make sure you discuss any questions you have with your health care provider. Document Revised: 09/28/2017 Document Reviewed: 09/07/2016 Elsevier Patient Education  2020 [employer].

## 2019-12-17 MED FILL — Vancomycin HCl-Dextrose IV Soln 1 GM/200ML-5%: INTRAVENOUS | Qty: 200 | Status: AC

## 2019-12-17 MED FILL — Lidocaine HCl Local Inj 1%: INTRAMUSCULAR | Qty: 60 | Status: AC

## 2019-12-17 MED FILL — Gentamicin Sulfate Inj 40 MG/ML: INTRAMUSCULAR | Qty: 80 | Status: AC

## 2019-12-30 ENCOUNTER — Ambulatory Visit (INDEPENDENT_AMBULATORY_CARE_PROVIDER_SITE_OTHER): Payer: Medicare Other | Admitting: *Deleted

## 2019-12-30 ENCOUNTER — Other Ambulatory Visit: Payer: Self-pay

## 2019-12-30 DIAGNOSIS — Z95810 Presence of automatic (implantable) cardiac defibrillator: Secondary | ICD-10-CM | POA: Diagnosis not present

## 2019-12-30 DIAGNOSIS — Z4502 Encounter for adjustment and management of automatic implantable cardiac defibrillator: Secondary | ICD-10-CM | POA: Diagnosis not present

## 2019-12-30 DIAGNOSIS — I428 Other cardiomyopathies: Secondary | ICD-10-CM

## 2019-12-30 LAB — CUP PACEART INCLINIC DEVICE CHECK
Battery Remaining Longevity: 92 mo
Battery Voltage: 3.01 V
Brady Statistic RA Percent Paced: 5 %
Brady Statistic RV Percent Paced: 99.13 %
Date Time Interrogation Session: 20210302163537
Implantable Lead Implant Date: 20080307
Implantable Lead Implant Date: 20080307
Implantable Lead Implant Date: 20080307
Implantable Lead Location: 753858
Implantable Lead Location: 753859
Implantable Lead Location: 753860
Implantable Lead Model: 7001
Implantable Pulse Generator Implant Date: 20210216
Lead Channel Impedance Value: 337.5 Ohm
Lead Channel Impedance Value: 475 Ohm
Lead Channel Impedance Value: 737.5 Ohm
Lead Channel Pacing Threshold Amplitude: 0.5 V
Lead Channel Pacing Threshold Amplitude: 0.5 V
Lead Channel Pacing Threshold Amplitude: 0.75 V
Lead Channel Pacing Threshold Pulse Width: 0.5 ms
Lead Channel Pacing Threshold Pulse Width: 0.5 ms
Lead Channel Pacing Threshold Pulse Width: 0.8 ms
Lead Channel Sensing Intrinsic Amplitude: 12 mV
Lead Channel Sensing Intrinsic Amplitude: 5 mV
Lead Channel Setting Pacing Amplitude: 1.5 V
Lead Channel Setting Pacing Amplitude: 1.75 V
Lead Channel Setting Pacing Amplitude: 2 V
Lead Channel Setting Pacing Pulse Width: 0.5 ms
Lead Channel Setting Pacing Pulse Width: 0.8 ms
Lead Channel Setting Sensing Sensitivity: 2 mV
Pulse Gen Model: 3222
Pulse Gen Serial Number: 9105249

## 2019-12-30 NOTE — Patient Instructions (Signed)
Call office if you have any drainage, redness, or increased swelling at the wound site.

## 2019-12-30 NOTE — Progress Notes (Signed)
CRT-D device check and wound check in office. Steri-strips removed, wound edges approximated, no redness , edema , or drainage. Thresholds and sensing consistent with previous device measurements. Lead impedance trends stable over time. AT/AF burden < 1 %, 22 AMS episode with 18 egms show what appears to be AT . No ventricular arrhythmia episodes recorded. Patient bi-ventricularly pacing 99.3% of the time. Device programmed with appropriate safety margins. Heart failure diagnostics reviewed and trends are stable for patient. No changes made this session. Estimated longevity 7 yrs 8 months.  Patient enrolled in remote follow up and next remote scheduled for 01/05/20 and every 3 months . Follow up with [facility] 03/31/20. Patient education completed including shock plan.

## 2020-01-05 ENCOUNTER — Ambulatory Visit (INDEPENDENT_AMBULATORY_CARE_PROVIDER_SITE_OTHER): Payer: Medicare Other

## 2020-01-05 DIAGNOSIS — Z950 Presence of cardiac pacemaker: Secondary | ICD-10-CM

## 2020-01-05 DIAGNOSIS — I5032 Chronic diastolic (congestive) heart failure: Secondary | ICD-10-CM

## 2020-01-06 NOTE — Progress Notes (Signed)
EPIC Encounter for ICM Monitoring  Patient Name: Danielle Mckinney is a 69 y.o. female Date: 01/06/2020 Primary Care Physican: Maurice Small, MD Primary Cardiologist: Croitoru Electrophysiologist: Santina Evans Pacing: >99% Last Weight:189lbs(office weight)   Transmission reviewed.  CorvueThoracic impedance is developing baseline since 12/16/19 battery replacement.  Prescribed:Furosemide 40 mg 1 tablet as needed and Potassium 20 mEq 1 tablet to be taken when taking Furosemide.  Recommendations:None  Follow-up plan: ICM clinic phone appointment on4/09/2020.91day device clinic remote transmission 03/16/2020. Office visit with Dr Lovena Le on 03/31/2020.  Copy of ICM check sent to Dr.Taylor.  3 month ICM trend: 01/05/2020    1 Year ICM trend:       [name], RN 01/06/2020 2:37 PM

## 2020-01-14 ENCOUNTER — Ambulatory Visit (INDEPENDENT_AMBULATORY_CARE_PROVIDER_SITE_OTHER): Payer: Medicare Other | Admitting: Pharmacist Clinician (PhC)/ Clinical Pharmacy Specialist

## 2020-01-14 ENCOUNTER — Other Ambulatory Visit: Payer: Self-pay

## 2020-01-14 DIAGNOSIS — I480 Paroxysmal atrial fibrillation: Secondary | ICD-10-CM | POA: Diagnosis not present

## 2020-01-14 DIAGNOSIS — Z7901 Long term (current) use of anticoagulants: Secondary | ICD-10-CM | POA: Diagnosis not present

## 2020-01-14 DIAGNOSIS — I639 Cerebral infarction, unspecified: Secondary | ICD-10-CM

## 2020-01-14 LAB — POCT INR: INR: 1.5 — AB (ref 2.0–3.0)

## 2020-01-14 NOTE — Patient Instructions (Signed)
Take 1.5 tablets today Wednesday March 17, then continue taking 1.5 tablets daily except 1 tablet each Monday, Wednesday, and Friday.  Recheck INR in 3 weeks.

## 2020-01-23 ENCOUNTER — Other Ambulatory Visit: Payer: Self-pay

## 2020-01-23 MED ORDER — SPIRONOLACTONE 25 MG PO TABS: 25.0000 mg | ORAL_TABLET | Freq: Every day | ORAL | 3 refills | Status: DC

## 2020-01-30 ENCOUNTER — Telehealth: Payer: Self-pay

## 2020-01-30 NOTE — Telephone Encounter (Signed)
Returned call to patient. She reports feeling a "twinge" at her left chest ("on the inside"), every 3-4 days. Not painful, but noticeable. Lasts <72min. No other associated symptoms, does not seem correlated with activity. Pt denies SOB, chest pain, dizziness, or other symptoms. Denies any thumping/hiccuping sensation, does not seem consistent with PNS.  Transmission reviewed. Presenting rhythm AS/BiVP 70s. No VT/VF episodes. Lead trends stable. 74 "AMS" episodes (1.3% burden)--all available EGMs show short episodes of ST/AT with FFRW oversensing, resulting in mode switch and temporary loss of BiVP pacing. Longest AMS episode 1.5hrs (no EGM), history of AF, on warfarin. BiVP 98% overall. Will address FFRW oversensing at 91 day f/u on 03/31/20. Explained to pt that transmission shows no episodes that would likely cause the symptoms she is describing. Advised message will be forwarded to Dr. Lovena Le for review and any additional recommendations. Encouraged pt to call back if symptoms increase in frequency or duration. Pt verbalizes understanding and agreement with plan.

## 2020-01-30 NOTE — Telephone Encounter (Signed)
The pt states she has been feeling some neurogical tings since she got her gen change. She is going to send a manual transmission in 45 minutes so the nurse can review it and call him back.

## 2020-01-30 NOTE — Telephone Encounter (Signed)
Transmission received.

## 2020-02-04 ENCOUNTER — Other Ambulatory Visit: Payer: Self-pay

## 2020-02-04 ENCOUNTER — Ambulatory Visit (INDEPENDENT_AMBULATORY_CARE_PROVIDER_SITE_OTHER): Payer: Medicare Other | Admitting: Pharmacist Clinician (PhC)/ Clinical Pharmacy Specialist

## 2020-02-04 DIAGNOSIS — Z7901 Long term (current) use of anticoagulants: Secondary | ICD-10-CM

## 2020-02-04 DIAGNOSIS — I639 Cerebral infarction, unspecified: Secondary | ICD-10-CM

## 2020-02-04 DIAGNOSIS — I480 Paroxysmal atrial fibrillation: Secondary | ICD-10-CM

## 2020-02-04 LAB — POCT INR: INR: 2.1 (ref 2.0–3.0)

## 2020-02-06 NOTE — Telephone Encounter (Signed)
No additional recs.

## 2020-02-09 ENCOUNTER — Ambulatory Visit (INDEPENDENT_AMBULATORY_CARE_PROVIDER_SITE_OTHER): Payer: Medicare Other

## 2020-02-09 DIAGNOSIS — Z950 Presence of cardiac pacemaker: Secondary | ICD-10-CM | POA: Diagnosis not present

## 2020-02-09 DIAGNOSIS — I5032 Chronic diastolic (congestive) heart failure: Secondary | ICD-10-CM | POA: Diagnosis not present

## 2020-02-10 NOTE — Progress Notes (Signed)
EPIC Encounter for ICM Monitoring  Patient Name: Danielle Mckinney is a 69 y.o. female Date: 02/10/2020 Primary Care Physican: Maurice Small, MD Primary Cardiologist: Croitoru Electrophysiologist: Santina Evans Pacing: 98% 02/10/2020 Weight:178 - 182lbs  AT/AF Burden: 1.4% (was <1% on 01/05/2020 report)   Spoke with patient and she is doing well.  She denies any fluid symptoms.  CorvueThoracic impedance normal.  Prescribed:  Furosemide 40 mg Take 40 mg by mouth daily as needed for edema.  Takes a few days a month.   Potassium 20 mEq Take 20 mEq by mouth daily as needed (when taking furosemide).   Recommendations:No changes and encouraged to call if experiencing any fluid symptoms.  Follow-up plan: ICM clinic phone appointment on5/19/2021.91day device clinic remote transmission5/18/2021. Office visit with Dr Lovena Le on 03/31/2020.  Copy of ICM check sent to Dr.Taylor and Dr Sallyanne Kuster.  3 month ICM trend: 02/09/2020    1 Year ICM trend:       [name], RN 02/10/2020 11:15 AM

## 2020-03-16 ENCOUNTER — Ambulatory Visit (INDEPENDENT_AMBULATORY_CARE_PROVIDER_SITE_OTHER): Payer: Medicare Other | Admitting: *Deleted

## 2020-03-16 DIAGNOSIS — I428 Other cardiomyopathies: Secondary | ICD-10-CM | POA: Diagnosis not present

## 2020-03-16 DIAGNOSIS — I4891 Unspecified atrial fibrillation: Secondary | ICD-10-CM

## 2020-03-16 LAB — CUP PACEART REMOTE DEVICE CHECK
Battery Remaining Longevity: 90 mo
Battery Remaining Percentage: 95.5 %
Battery Voltage: 3.01 V
Brady Statistic AP VP Percent: 6.1 %
Brady Statistic AP VS Percent: 1 %
Brady Statistic AS VP Percent: 93 %
Brady Statistic AS VS Percent: 1 %
Brady Statistic RA Percent Paced: 5.6 %
Date Time Interrogation Session: 20210518034245
Implantable Lead Implant Date: 20080307
Implantable Lead Implant Date: 20080307
Implantable Lead Implant Date: 20080307
Implantable Lead Location: 753858
Implantable Lead Location: 753859
Implantable Lead Location: 753860
Implantable Lead Model: 7001
Implantable Pulse Generator Implant Date: 20210216
Lead Channel Impedance Value: 330 Ohm
Lead Channel Impedance Value: 480 Ohm
Lead Channel Impedance Value: 660 Ohm
Lead Channel Pacing Threshold Amplitude: 0.5 V
Lead Channel Pacing Threshold Amplitude: 0.5 V
Lead Channel Pacing Threshold Amplitude: 0.625 V
Lead Channel Pacing Threshold Pulse Width: 0.5 ms
Lead Channel Pacing Threshold Pulse Width: 0.5 ms
Lead Channel Pacing Threshold Pulse Width: 0.8 ms
Lead Channel Sensing Intrinsic Amplitude: 12 mV
Lead Channel Sensing Intrinsic Amplitude: 5 mV
Lead Channel Setting Pacing Amplitude: 1.5 V
Lead Channel Setting Pacing Amplitude: 1.75 V
Lead Channel Setting Pacing Amplitude: 2 V
Lead Channel Setting Pacing Pulse Width: 0.5 ms
Lead Channel Setting Pacing Pulse Width: 0.8 ms
Lead Channel Setting Sensing Sensitivity: 2 mV
Pulse Gen Model: 3222
Pulse Gen Serial Number: 9105249

## 2020-03-17 ENCOUNTER — Telehealth: Payer: Self-pay

## 2020-03-17 ENCOUNTER — Ambulatory Visit (INDEPENDENT_AMBULATORY_CARE_PROVIDER_SITE_OTHER): Payer: Medicare Other | Admitting: Pharmacist Clinician (PhC)/ Clinical Pharmacy Specialist

## 2020-03-17 ENCOUNTER — Ambulatory Visit (INDEPENDENT_AMBULATORY_CARE_PROVIDER_SITE_OTHER): Payer: Medicare Other

## 2020-03-17 ENCOUNTER — Other Ambulatory Visit: Payer: Self-pay

## 2020-03-17 DIAGNOSIS — I5032 Chronic diastolic (congestive) heart failure: Secondary | ICD-10-CM

## 2020-03-17 DIAGNOSIS — I639 Cerebral infarction, unspecified: Secondary | ICD-10-CM

## 2020-03-17 DIAGNOSIS — Z7901 Long term (current) use of anticoagulants: Secondary | ICD-10-CM

## 2020-03-17 DIAGNOSIS — I480 Paroxysmal atrial fibrillation: Secondary | ICD-10-CM

## 2020-03-17 DIAGNOSIS — Z95810 Presence of automatic (implantable) cardiac defibrillator: Secondary | ICD-10-CM | POA: Diagnosis not present

## 2020-03-17 LAB — POCT INR: INR: 2.1 (ref 2.0–3.0)

## 2020-03-17 NOTE — Progress Notes (Signed)
EPIC Encounter for ICM Monitoring  Patient Name: Danielle Mckinney is a 69 y.o. female Date: 03/17/2020 Primary Care Physican: Maurice Small, MD Primary Cardiologist: Croitoru Electrophysiologist: Santina Evans Pacing: 97% 02/10/2020 Weight:178 - 182lbs  AT/AF Burden: 2.2% (was <1% on 01/05/2020 report) - takes Warfarin    Attempted call to patient and unable to reach.  Left detailed message per DPR regarding transmission. Transmission reviewed. .  CorvueThoracic impedancesuggesting possible fluid accumulation starting 5/14 but trending close to baseline.  Prescribed:  Furosemide 40 mg Take 40 mg by mouth daily as needed for edema.  Takes a few days a month.   Potassium 20 mEq Take 20 mEq by mouth daily as needed (when taking furosemide).   Warfarin as directed  Recommendations:Left voice mail with ICM number and encouraged to call if experiencing any fluid symptoms.  Follow-up plan: ICM clinic phone appointment on6/21/2021.91day device clinic remote transmission8/17/2021. Office visit with DrTaylor on 03/31/2020.  Copy of ICM check sent to Dr.Taylor and Dr Sallyanne Kuster.  3 month ICM trend: 03/17/2020      1 Year ICM trend:       [name], RN 03/17/2020 8:20 AM

## 2020-03-17 NOTE — Telephone Encounter (Signed)
Remote ICM transmission received.  Attempted call to patient regarding ICM remote transmission and left detailed message per DPR.  Advised to return call for any fluid symptoms or questions. Next ICM remote transmission scheduled 04/19/2020.     

## 2020-03-18 NOTE — Progress Notes (Signed)
Remote ICD transmission.   

## 2020-03-31 ENCOUNTER — Other Ambulatory Visit: Payer: Self-pay

## 2020-03-31 ENCOUNTER — Ambulatory Visit (INDEPENDENT_AMBULATORY_CARE_PROVIDER_SITE_OTHER): Payer: Medicare Other | Admitting: Internal Medicine

## 2020-03-31 ENCOUNTER — Encounter: Payer: Self-pay | Admitting: Internal Medicine

## 2020-03-31 VITALS — BP 110/70 | HR 63 | Ht 62.5 in | Wt 183.0 lb

## 2020-03-31 DIAGNOSIS — Z950 Presence of cardiac pacemaker: Secondary | ICD-10-CM

## 2020-03-31 DIAGNOSIS — I428 Other cardiomyopathies: Secondary | ICD-10-CM | POA: Diagnosis not present

## 2020-03-31 NOTE — Progress Notes (Signed)
HPI Danielle Mckinney returns today for followup. She is a pleasant 69 yo woman with chronic systolic heart failure, s/p biv ICD with normalization of her LV function and downgrade at ERI to a Biv PPM. She has done well since her gen change out. She denies chest pain or sob. No syncope. No edema. She is thinking about going to [place] to see her daughter.  Allergies  Allergen Reactions  . Codeine Nausea And Vomiting  . Lisinopril Cough  . Oxycodone Nausea And Vomiting  . Penicillins Nausea And Vomiting    Did it involve swelling of the face/tongue/throat, SOB, or low BP? No Did it involve sudden or severe rash/hives, skin peeling, or any reaction on the inside of your mouth or nose? No Did you need to seek medical attention at a hospital or doctor's office? No When did it last happen? If all above answers are "NO", may proceed with cephalosporin use.   . Sulfa Antibiotics Nausea And Vomiting     Current Outpatient Medications  Medication Sig Dispense Refill  . Calcium-Magnesium-Vitamin D (CALCIUM 1200+D3 PO) Take 1 tablet by mouth daily.     . carvedilol (COREG) 6.25 MG tablet TAKE 1 AND 1/2 TABLETS BY MOUTH TWICE A DAY WITH FOOD (Patient taking differently: Take 9.375 mg by mouth 2 (two) times daily with a meal. ) 270 tablet 3  . Cholecalciferol (D3-1000) 1000 UNITS tablet Take 1,000 Units by mouth daily.    . furosemide (LASIX) 40 MG tablet TAKE 1 TABLET BY MOUTH EVERY DAY AS NEEDED FOR SWELLING (Patient taking differently: Take 40 mg by mouth daily as needed for edema. ) 90 tablet 3  . Krill Oil 500 MG CAPS Take 500 mg by mouth daily.    [name] Kitchen losartan (COZAAR) 100 MG tablet TAKE 1/2 TABLET(50 MG) BY MOUTH TWICE DAILY (Patient taking differently: Take 50 mg by mouth 2 (two) times daily. ) 90 tablet 2  . OVER THE COUNTER MEDICATION Take 1 tablet by mouth daily. Olive Leaf and Oregano    . Polyethyl Glycol-Propyl Glycol (SYSTANE OP) Place 1 drop into both eyes 2 (two) times daily.    .  potassium chloride SA (K-DUR) 20 MEQ tablet take 1 tablet by mouth once daily if needed **TAKE ALONG WITH FUROSEMIDE WHEN NEEDED** (Patient taking differently: Take 20 mEq by mouth daily as needed (when taking furosemide). ) 30 tablet 3  . spironolactone (ALDACTONE) 25 MG tablet Take 1 tablet (25 mg total) by mouth daily. 90 tablet 3  . triamcinolone (KENALOG) 0.025 % ointment Apply 1 application topically 2 (two) times daily. (Patient taking differently: Apply 1 application topically 2 (two) times daily as needed (eczema). ) 30 g 0  . warfarin (COUMADIN) 4 MG tablet TAKE 1 TO 1 AND A HALF TABLET BY MOUTH AS DIRECTED BY COUMADIN CLINIC (Patient taking differently: Take 4-6 mg by mouth See admin instructions. Take 4 mg by mouth on Monday, Wednesday and Friday and take 6 mg by mouth on Tuesday, Thursday, Saturday and Sunday) 135 tablet 1   No current facility-administered medications for this visit.     Past Medical History:  Diagnosis Date  . Atrial fibrillation ([place])   . Automatic implantable cardioverter-defibrillator in situ 08/25/2009   Qualifier: Diagnosis of  By: Lovena Le, MD, [facility], Binnie Kand    . Biventricular ICD (implantable cardioverter-defibrillator) in place    St.Jude  . Breast cancer ([place])   . Cancer (HCC)    Breast cancer-BRACA I gene  .  CHF 08/18/2009   Qualifier: Diagnosis of  By: Lynden Ang    . CHF (congestive heart failure) ([place])   . Chronic diastolic CHF (congestive heart failure) ([place]) 04/23/2012  . CIN I (cervical intraepithelial neoplasia I)   . CVA (cerebral vascular accident) ([place]) 01/15/2013  . Essential hypertension 08/18/2009   Qualifier: Diagnosis of  By: Lynden Ang    . Fibroid   . Fibroid uterus 04/15/2013  . 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  The report date is January 21, 1998.   [name] Kitchen HTN (hypertension)   . Long term current use of anticoagulant therapy 01/15/2013   . 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 Stroke ([place])     ROS:   All systems reviewed and negative except as noted in the HPI.   Past Surgical History:  Procedure Laterality Date  . BIV ICD GENERTAOR CHANGE OUT  12/20/2011   St.Jude  . BIV ICD GENERTAOR CHANGE OUT N/A 12/20/2011   Procedure: BIV ICD GENERTAOR CHANGE OUT;  Surgeon: Evans Lance, MD;  Location: [facility] CATH LAB;  Service: Cardiovascular;  Laterality: N/A;  . BIV PACEMAKER GENERATOR CHANGEOUT N/A 12/16/2019   Procedure: DOWNGRADE TO BIV PACEMAKER;  Surgeon: Evans Lance, MD;  Location: [facility] CV LAB;  Service: Cardiovascular;  Laterality: N/A;  . BREAST SURGERY     Bilateral mastctomy and reconstruction TRAM  . CARDIAC DEFIBRILLATOR PLACEMENT    . CERVICAL CONE BIOPSY    . COLPOSCOPY    . GYNECOLOGIC CRYOSURGERY    . MASTECTOMY    . OOPHORECTOMY     BSO  . TRANSTHORACIC ECHOCARDIOGRAM  08/14/06  . TUBAL LIGATION    . US ECHOCARDIOGRAPHY  06/25/2012   borderline concentric LVH,LV systolic fx mildly reduced,impaired LV relaxation     Family History  Problem Relation Age of Onset  . Breast cancer Sister        Age 59  . Hypertension Sister   . Heart disease Maternal Grandfather   . Clotting disorder Mother   . Cirrhosis Father   . Coronary artery disease Other   . Hypertension Brother      Social History   Socioeconomic History  . Marital status: Divorced    Spouse name: Not on file  . Number of children: Not on file  . Years of education: Not on file  . Highest education level: Not on file  Occupational History  . Not on file  Tobacco Use  . Smoking status: Never Smoker  . Smokeless tobacco: Never Used  Substance and Sexual Activity  . Alcohol use: No    Alcohol/week: 0.0 standard drinks  . Drug use: No  . Sexual activity: Not Currently    Birth control/protection: Surgical    Comment: interecourse age 20, sexual partners more than 5  Other Topics  Concern  . Not on file  Social History Narrative  . Not on file   Social Determinants of Health   Financial Resource Strain:   . Difficulty of Paying Living Expenses:   Food Insecurity:   . Worried About Charity fundraiser in the Last Year:   . Arboriculturist in the Last Year:   Transportation Needs:   . Film/video editor (Medical):   [name] Kitchen Lack of Transportation (Non-Medical):   Physical Activity:   . Days of Exercise per Week:   . Minutes of Exercise per  Session:   Stress:   . Feeling of Stress :   Social Connections:   . Frequency of Communication with Friends and Family:   . Frequency of Social Gatherings with Friends and Family:   . Attends Religious Services:   . Active Member of Clubs or Organizations:   . Attends Archivist Meetings:   [name] Kitchen Marital Status:   Intimate Partner Violence:   . Fear of Current or Ex-Partner:   . Emotionally Abused:   [name] Kitchen Physically Abused:   . Sexually Abused:      BP 110/70   Pulse 63   Ht 5' 2.5" (1.588 m)   Wt 183 lb (83 kg)   SpO2 95%   BMI 32.94 kg/m   Physical Exam:  Well appearing NAD HEENT: Unremarkable Neck:  No JVD, no thyromegally Lymphatics:  No adenopathy Back:  No CVA tenderness Lungs:  Clear with no wheezes HEART:  Regular rate rhythm, no murmurs, no rubs, no clicks Abd:  soft, positive bowel sounds, no organomegally, no rebound, no guarding Ext:  2 plus pulses, no edema, no cyanosis, no clubbing Skin:  No rashes no nodules Neuro:  CN II through XII intact, motor grossly intact  EKG - NSR with biv pacing  DEVICE  Normal device function.  See PaceArt for details.   Assess/Plan: 1. Chronic systolic heart failure - her symptoms remain class 2A. She will continue her current meds. 2. HTN - her sbp is well controlled. No change. 3. BIV PPM - Her St. Jude Bi PPM is working normally. We will recheck in several months. 4. Obesity - her weight is down 8 lbs. Continue.  [name]

## 2020-03-31 NOTE — Patient Instructions (Signed)
Medication Instructions:  Your physician recommends that you continue on your current medications as directed. Please refer to the Current Medication list given to you today.  Labwork: None ordered.  Testing/Procedures: None ordered.  Follow-Up: Your physician wants you to follow-up in: one year with Dr. Lovena Le.   You will receive a reminder letter in the mail two months in advance. If you don't receive a letter, please call our office to schedule the follow-up appointment.  Remote monitoring is used to monitor your ICD from home. This monitoring reduces the number of office visits required to check your device to one time per year. It allows [place] to keep an eye on the functioning of your device to ensure it is working properly. You are scheduled for a device check from home on 04/19/2020. You may send your transmission at any time that day. If you have a wireless device, the transmission will be sent automatically. After your physician reviews your transmission, you will receive a postcard with your next transmission date.  Any Other Special Instructions Will Be Listed Below (If Applicable).  If you need a refill on your cardiac medications before your next appointment, please call your pharmacy.

## 2020-04-01 NOTE — Addendum Note (Signed)
Addended by: Rose Phi on: 04/01/2020 11:49 AM   Modules accepted: Orders

## 2020-04-07 LAB — CUP PACEART INCLINIC DEVICE CHECK
Date Time Interrogation Session: 20210602123315
Implantable Lead Implant Date: 20080307
Implantable Lead Implant Date: 20080307
Implantable Lead Implant Date: 20080307
Implantable Lead Location: 753858
Implantable Lead Location: 753859
Implantable Lead Location: 753860
Implantable Lead Model: 7001
Implantable Pulse Generator Implant Date: 20210216
Lead Channel Pacing Threshold Amplitude: 0.5 V
Lead Channel Pacing Threshold Amplitude: 0.625 V
Lead Channel Pacing Threshold Amplitude: 0.75 V
Lead Channel Pacing Threshold Pulse Width: 0.5 ms
Lead Channel Pacing Threshold Pulse Width: 0.5 ms
Lead Channel Pacing Threshold Pulse Width: 0.8 ms
Lead Channel Sensing Intrinsic Amplitude: 12 mV
Lead Channel Sensing Intrinsic Amplitude: 5 mV
Pulse Gen Model: 3222
Pulse Gen Serial Number: 9105249

## 2020-04-19 ENCOUNTER — Ambulatory Visit (INDEPENDENT_AMBULATORY_CARE_PROVIDER_SITE_OTHER): Payer: Medicare Other

## 2020-04-19 DIAGNOSIS — I5032 Chronic diastolic (congestive) heart failure: Secondary | ICD-10-CM

## 2020-04-19 DIAGNOSIS — Z950 Presence of cardiac pacemaker: Secondary | ICD-10-CM

## 2020-04-20 ENCOUNTER — Telehealth: Payer: Self-pay

## 2020-04-20 NOTE — Progress Notes (Signed)
EPIC Encounter for ICM Monitoring  Patient Name: Danielle Mckinney is a 69 y.o. female Date: 04/20/2020 Primary Care Physican: Maurice Small, MD Primary Cardiologist: Croitoru Electrophysiologist: Santina Evans Pacing: >99% 4/13/2021Weight:178 -182lbs  AT/AF Burden: 0% - takes Warfarin  Attempted call to patient and unable to reach.  Left detailed message per DPR regarding transmission. Transmission reviewed.   CorvueThoracic impedancenormal.  Prescribed:  Furosemide 40 mgTake 40 mg by mouth daily as needed for edema.Takes a few days a month.  Potassium 20 mEqTake 20 mEq by mouth daily as needed (when taking furosemide).  Warfarin as directed  Recommendations:Left voice mail with ICM number and encouraged to call if experiencing any fluid symptoms.  Follow-up plan: ICM clinic phone appointment on7/26/2021.91day device clinic remote transmission8/17/2021.   Copy of ICM check sent to Dr.Taylor.  3 month ICM trend: 04/19/2020    1 Year ICM trend:       Danielle Billings, RN 04/20/2020 9:51 AM

## 2020-04-20 NOTE — Telephone Encounter (Signed)
Remote ICM transmission received.  Attempted call to patient regarding ICM remote transmission and left detailed message per DPR.  Advised to return call for any fluid symptoms or questions. Next ICM remote transmission scheduled 05/24/2020.   ° ° °

## 2020-05-07 ENCOUNTER — Other Ambulatory Visit: Payer: Self-pay

## 2020-05-07 MED ORDER — LOSARTAN POTASSIUM 100 MG PO TABS: ORAL_TABLET | ORAL | 2 refills | Status: DC

## 2020-05-19 ENCOUNTER — Other Ambulatory Visit: Payer: Self-pay

## 2020-05-19 ENCOUNTER — Ambulatory Visit (INDEPENDENT_AMBULATORY_CARE_PROVIDER_SITE_OTHER): Payer: Medicare Other

## 2020-05-19 DIAGNOSIS — I480 Paroxysmal atrial fibrillation: Secondary | ICD-10-CM

## 2020-05-19 DIAGNOSIS — Z7901 Long term (current) use of anticoagulants: Secondary | ICD-10-CM

## 2020-05-19 LAB — POCT INR: INR: 2.2 (ref 2.0–3.0)

## 2020-05-19 NOTE — Patient Instructions (Signed)
Continue taking 1.5 tablets daily except 1 tablet each Monday, Wednesday, and Friday.  Recheck INR in 7 weeks.

## 2020-05-24 ENCOUNTER — Ambulatory Visit (INDEPENDENT_AMBULATORY_CARE_PROVIDER_SITE_OTHER): Payer: Medicare Other

## 2020-05-24 ENCOUNTER — Other Ambulatory Visit: Payer: Self-pay

## 2020-05-24 DIAGNOSIS — I5032 Chronic diastolic (congestive) heart failure: Secondary | ICD-10-CM

## 2020-05-24 DIAGNOSIS — Z95810 Presence of automatic (implantable) cardiac defibrillator: Secondary | ICD-10-CM | POA: Diagnosis not present

## 2020-05-24 MED ORDER — FUROSEMIDE 40 MG PO TABS: ORAL_TABLET | ORAL | 2 refills | Status: DC

## 2020-05-25 ENCOUNTER — Telehealth: Payer: Self-pay

## 2020-05-25 NOTE — Telephone Encounter (Signed)
Remote ICM transmission received.  Attempted call to patient regarding ICM remote transmission and left detailed message per DPR.  Advised to return call for any fluid symptoms or questions.  

## 2020-05-25 NOTE — Progress Notes (Signed)
EPIC Encounter for ICM Monitoring  Patient Name: EVANGELYN CROUSE is a 69 y.o. female Date: 05/25/2020 Primary Care Physican: Maurice Small, MD Primary Cardiologist: Croitoru Electrophysiologist: Santina Evans Pacing: >99% 4/13/2021Weight:178 -182lbs  AT/AF Burden:0% - takes Warfarin  Attempted call to patient and unable to reach.  Left detailed message per DPR regarding transmission. Transmission reviewed.   CorvueThoracic impedance starting to trend below baseline suggesting possible fluid accumulationon day of transmission.  Prescribed:  Furosemide 40 mgTake 40 mg by mouth daily as needed for edema.Takes a few days a month.  Potassium 20 mEqTake 20 mEq by mouth daily as needed (when taking furosemide).  Warfarin as directed  Recommendations:Left voice mail with ICM number and encouraged to call if experiencing any fluid symptoms.  Follow-up plan: ICM clinic phone appointment on8/30/2021.91day device clinic remote transmission8/17/2021.   EP/Cardiology Office Visits: 09/12/2020 with Dr. Sallyanne Kuster and 03/27/2021 with Dr Lovena Le.    Copy of ICM check sent to Dr. Lovena Le.   3 month ICM trend: 05/24/2020    1 Year ICM trend:       [name], RN 05/25/2020 5:02 PM

## 2020-06-15 ENCOUNTER — Ambulatory Visit (INDEPENDENT_AMBULATORY_CARE_PROVIDER_SITE_OTHER): Payer: Medicare Other | Admitting: *Deleted

## 2020-06-15 ENCOUNTER — Other Ambulatory Visit: Payer: Self-pay | Admitting: Cardiovascular Disease

## 2020-06-15 DIAGNOSIS — I428 Other cardiomyopathies: Secondary | ICD-10-CM

## 2020-06-15 LAB — CUP PACEART REMOTE DEVICE CHECK
Battery Remaining Longevity: 100 mo
Battery Remaining Percentage: 95.5 %
Battery Voltage: 3.01 V
Brady Statistic AP VP Percent: 5.4 %
Brady Statistic AP VS Percent: 1 %
Brady Statistic AS VP Percent: 94 %
Brady Statistic AS VS Percent: 1 %
Brady Statistic RA Percent Paced: 5.1 %
Date Time Interrogation Session: 20210817041653
Implantable Lead Implant Date: 20080307
Implantable Lead Implant Date: 20080307
Implantable Lead Implant Date: 20080307
Implantable Lead Location: 753858
Implantable Lead Location: 753859
Implantable Lead Location: 753860
Implantable Lead Model: 7001
Implantable Pulse Generator Implant Date: 20210216
Lead Channel Impedance Value: 340 Ohm
Lead Channel Impedance Value: 550 Ohm
Lead Channel Impedance Value: 810 Ohm
Lead Channel Pacing Threshold Amplitude: 0.5 V
Lead Channel Pacing Threshold Amplitude: 0.75 V
Lead Channel Pacing Threshold Amplitude: 0.75 V
Lead Channel Pacing Threshold Pulse Width: 0.5 ms
Lead Channel Pacing Threshold Pulse Width: 0.5 ms
Lead Channel Pacing Threshold Pulse Width: 0.8 ms
Lead Channel Sensing Intrinsic Amplitude: 12 mV
Lead Channel Sensing Intrinsic Amplitude: 5 mV
Lead Channel Setting Pacing Amplitude: 1.5 V
Lead Channel Setting Pacing Amplitude: 1.75 V
Lead Channel Setting Pacing Amplitude: 2 V
Lead Channel Setting Pacing Pulse Width: 0.5 ms
Lead Channel Setting Pacing Pulse Width: 0.8 ms
Lead Channel Setting Sensing Sensitivity: 2 mV
Pulse Gen Model: 3222
Pulse Gen Serial Number: 9105249

## 2020-06-16 NOTE — Telephone Encounter (Signed)
Rx has been sent to the pharmacy electronically. ° °

## 2020-06-17 NOTE — Progress Notes (Signed)
Remote pacemaker transmission.   

## 2020-06-18 ENCOUNTER — Telehealth: Payer: Self-pay

## 2020-06-18 NOTE — Telephone Encounter (Signed)
Returned call as requested by voice mail message.  Patient said she is on vacation and will return home after 07/15/2020.  Rescheduled 31 day remote transmission for 07/19/2020.  Advised for future to take monitor with her if possible when she will be gone longer than 7-10 days.

## 2020-06-18 NOTE — Telephone Encounter (Signed)
Attempted return call to patient due to patient left message stating she will be out of town starting 8-20 through 9/10.  Left message to call back in regards if she has the monitor with her while traveling and if not will reschedule 8/30 home remote to after September 10th. Left call back number.

## 2020-06-30 ENCOUNTER — Other Ambulatory Visit: Payer: Self-pay

## 2020-07-13 ENCOUNTER — Telehealth: Payer: Self-pay | Admitting: Cardiovascular Disease

## 2020-07-13 MED ORDER — WARFARIN SODIUM 4 MG PO TABS: ORAL_TABLET | ORAL | 4 refills | Status: DC

## 2020-07-13 NOTE — Telephone Encounter (Signed)
Called pt to confirm all previous questions had been answered to her satisfaction and they had.  Told pt I would send in warfarin Rx to [facility].

## 2020-07-13 NOTE — Telephone Encounter (Signed)
Patient states that she has several questions for Dr. Victorino December nurse. Did not want to discuss further.

## 2020-07-13 NOTE — Telephone Encounter (Signed)
Spoke to the patient. She was calling to get a refill on her Coumadin. Message sent to pharmd.  She was also stating that she had a pain when she was driving long distance to [place]. The first time happened on July 23rd and then on her return a few weeks ago. This has only happened twice. She stated that the pain got better when she got up and walked. She denies a color change or temperature change. The pain was from the right hip to the calf. She has been compliant with the coumadin.   She has been advised to be sure to take breaks and walk when she travels long distances. She stated that she has not had this pain since she returned a few weeks ago.

## 2020-07-16 ENCOUNTER — Ambulatory Visit (INDEPENDENT_AMBULATORY_CARE_PROVIDER_SITE_OTHER): Payer: Medicare Other

## 2020-07-16 ENCOUNTER — Other Ambulatory Visit: Payer: Self-pay

## 2020-07-16 DIAGNOSIS — I480 Paroxysmal atrial fibrillation: Secondary | ICD-10-CM | POA: Diagnosis not present

## 2020-07-16 DIAGNOSIS — Z7901 Long term (current) use of anticoagulants: Secondary | ICD-10-CM | POA: Diagnosis not present

## 2020-07-16 LAB — POCT INR: INR: 1.9 — AB (ref 2.0–3.0)

## 2020-07-16 NOTE — Patient Instructions (Signed)
Continue taking 1.5 tablets daily except 1 tablet each Monday, Wednesday, and Friday.  Recheck INR in 6 weeks.

## 2020-07-19 ENCOUNTER — Ambulatory Visit (INDEPENDENT_AMBULATORY_CARE_PROVIDER_SITE_OTHER): Payer: Medicare Other

## 2020-07-19 DIAGNOSIS — I5032 Chronic diastolic (congestive) heart failure: Secondary | ICD-10-CM | POA: Diagnosis not present

## 2020-07-19 DIAGNOSIS — Z95810 Presence of automatic (implantable) cardiac defibrillator: Secondary | ICD-10-CM | POA: Diagnosis not present

## 2020-07-21 NOTE — Progress Notes (Signed)
EPIC Encounter for ICM Monitoring  Patient Name: Danielle Mckinney is a 69 y.o. female Date: 07/21/2020 Primary Care Physican: Maurice Small, MD Primary Cardiologist: Croitoru Electrophysiologist: Santina Evans Pacing: >99% 9/22/2021Weight:183lbs  AT/AF Burden:0% - takes Warfarin  Spoke with patient and reports feeling well at this time.  Denies fluid symptoms.    CorvueThoracic impedance normal but was suggesting possible fluid accumulation from 9/16-9/19.  Prescribed:  Furosemide 40 mgTake 40 mg by mouth daily as needed for edema.Takes a few days a month.  Potassium 20 mEqTake 20 mEq by mouth daily as needed (when taking furosemide).  Warfarin as directed  Recommendations:No changes and encouraged to call if experiencing any fluid symptoms.  Follow-up plan: ICM clinic phone appointment on10/25/2021.91day device clinic remote transmission11/16/2021.   EP/Cardiology Office Visits: 09/20/2020 with Dr. Sallyanne Kuster. Recall for 03/27/2021 with Dr Lovena Le.    Copy of ICM check sent to Dr. Lovena Le.   3 month ICM trend: 07/19/2020    1 Year ICM trend:       [name], RN 07/21/2020 9:23 AM

## 2020-08-12 ENCOUNTER — Inpatient Hospital Stay: Payer: Medicare Other | Admitting: Oncology

## 2020-08-12 ENCOUNTER — Inpatient Hospital Stay: Payer: Medicare Other

## 2020-08-23 ENCOUNTER — Ambulatory Visit (INDEPENDENT_AMBULATORY_CARE_PROVIDER_SITE_OTHER): Payer: Medicare Other

## 2020-08-23 DIAGNOSIS — I5032 Chronic diastolic (congestive) heart failure: Secondary | ICD-10-CM | POA: Diagnosis not present

## 2020-08-23 DIAGNOSIS — Z95810 Presence of automatic (implantable) cardiac defibrillator: Secondary | ICD-10-CM

## 2020-08-25 ENCOUNTER — Ambulatory Visit (INDEPENDENT_AMBULATORY_CARE_PROVIDER_SITE_OTHER): Payer: Medicare Other | Admitting: Pharmacist Clinician (PhC)/ Clinical Pharmacy Specialist

## 2020-08-25 ENCOUNTER — Telehealth: Payer: Self-pay

## 2020-08-25 ENCOUNTER — Other Ambulatory Visit: Payer: Self-pay

## 2020-08-25 DIAGNOSIS — Z7901 Long term (current) use of anticoagulants: Secondary | ICD-10-CM

## 2020-08-25 DIAGNOSIS — I480 Paroxysmal atrial fibrillation: Secondary | ICD-10-CM

## 2020-08-25 DIAGNOSIS — I639 Cerebral infarction, unspecified: Secondary | ICD-10-CM

## 2020-08-25 LAB — POCT INR: INR: 2.5 (ref 2.0–3.0)

## 2020-08-25 NOTE — Progress Notes (Signed)
EPIC Encounter for ICM Monitoring  Patient Name: Danielle Mckinney is a 69 y.o. female Date: 08/25/2020 Primary Care Physican: Maurice Small, MD Primary Cardiologist: Croitoru Electrophysiologist: Santina Evans Pacing: >99% 9/22/2021Weight:183lbs  AT/AF Burden:0% - takes Warfarin  Attempted call to patient and unable to reach.  Left detailed message per DPR regarding transmission. Transmission reviewed.    CorvueThoracic impedancenormal.  Prescribed:  Furosemide 40 mgTake 40 mg by mouth daily as needed for edema.Takes a few days a month.  Potassium 20 mEqTake 20 mEq by mouth daily as needed (when taking furosemide).  Warfarin as directed  Recommendations:Left voice mail with ICM number and encouraged to call if experiencing any fluid symptoms.  Follow-up plan: ICM clinic phone appointment on 09/29/2020.91day device clinic remote transmission11/16/2021.   EP/Cardiology Office Visits:09/20/2020 with Dr.Croitoru. Recall for 03/27/2021 with Dr Lovena Le.   Copy of ICM check sent to Dr.Taylor.   3 month ICM trend: 08/23/2020    1 Year ICM trend:       [name], RN 08/25/2020 3:01 PM

## 2020-08-25 NOTE — Telephone Encounter (Signed)
Remote ICM transmission received.  Attempted call to patient regarding ICM remote transmission and left detailed message per DPR.  Advised to return call for any fluid symptoms or questions. Next ICM remote transmission scheduled 09/29/2020.

## 2020-08-31 ENCOUNTER — Encounter: Payer: Self-pay | Admitting: Obstetrics & Gynecology

## 2020-08-31 ENCOUNTER — Ambulatory Visit (INDEPENDENT_AMBULATORY_CARE_PROVIDER_SITE_OTHER): Payer: Medicare Other | Admitting: Obstetrics & Gynecology

## 2020-08-31 ENCOUNTER — Other Ambulatory Visit: Payer: Self-pay

## 2020-08-31 VITALS — BP 130/70 | Ht 62.0 in | Wt 187.0 lb

## 2020-08-31 DIAGNOSIS — Z780 Asymptomatic menopausal state: Secondary | ICD-10-CM

## 2020-08-31 DIAGNOSIS — Z6834 Body mass index (BMI) 34.0-34.9, adult: Secondary | ICD-10-CM

## 2020-08-31 DIAGNOSIS — Z13820 Encounter for screening for osteoporosis: Secondary | ICD-10-CM

## 2020-08-31 DIAGNOSIS — E66811 Obesity, class 1: Secondary | ICD-10-CM

## 2020-08-31 DIAGNOSIS — E6609 Other obesity due to excess calories: Secondary | ICD-10-CM

## 2020-08-31 DIAGNOSIS — Z9889 Other specified postprocedural states: Secondary | ICD-10-CM | POA: Diagnosis not present

## 2020-08-31 DIAGNOSIS — Z01419 Encounter for gynecological examination (general) (routine) without abnormal findings: Secondary | ICD-10-CM

## 2020-08-31 DIAGNOSIS — R87610 Atypical squamous cells of undetermined significance on cytologic smear of cervix (ASC-US): Secondary | ICD-10-CM

## 2020-08-31 DIAGNOSIS — C50911 Malignant neoplasm of unspecified site of right female breast: Secondary | ICD-10-CM

## 2020-08-31 DIAGNOSIS — C50912 Malignant neoplasm of unspecified site of left female breast: Secondary | ICD-10-CM

## 2020-08-31 DIAGNOSIS — Z853 Personal history of malignant neoplasm of breast: Secondary | ICD-10-CM

## 2020-08-31 NOTE — Progress Notes (Signed)
[place] July 23, 1951 [id number]   History:    69 y.o. G3P2A1L2 Divorced.  Single.  RP: Established patient presenting for annual gyn exam   HPI: Postmenopause. No HRT. No PMB.No pelvic pain.H/O LEEPin 2010. 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 S/P Bilateral Mastectomy/Reconstructionand Bilateral Oophorectomy. H/O Heart Failure/Pace Maker. H/O Stroke.  BMI 34.2. Exercising 5 times a week. Mictions/BMs wnl.Health labs with Fam MD.  Will schedule repeat [place] now.  BD 2016 normal, repeat now here.   Past medical history,surgical history, family history and social history were all reviewed and documented in the EPIC chart.  Gynecologic History No LMP recorded. Patient is postmenopausal.  Obstetric History OB History  Gravida Para Term Preterm AB Living  $Remov'3 2 2   1 2  'wUmFST$ SAB TAB Ectopic Multiple Live Births               # Outcome Date GA Lbr Len/2nd Weight Sex Delivery Anes PTL Lv  3 AB           2 Term           1 Term              ROS: A ROS was performed and pertinent positives and negatives are included in the history.  GENERAL: No fevers or chills. HEENT: No change in vision, no earache, sore throat or sinus congestion. NECK: No pain or stiffness. CARDIOVASCULAR: No chest pain or pressure. No palpitations. PULMONARY: No shortness of breath, cough or wheeze. GASTROINTESTINAL: No abdominal pain, nausea, vomiting or diarrhea, melena or bright red blood per rectum. GENITOURINARY: No urinary frequency, urgency, hesitancy or dysuria. MUSCULOSKELETAL: No joint or muscle pain, no back pain, no recent trauma. DERMATOLOGIC: No rash, no itching, no lesions. ENDOCRINE: No polyuria, polydipsia, no heat or cold intolerance. No recent change in weight. HEMATOLOGICAL: No anemia or easy bruising or bleeding. NEUROLOGIC: No headache, seizures, numbness, tingling or weakness. PSYCHIATRIC: No depression, no loss of interest in normal activity or change in sleep  pattern.     Exam:   BP 130/70   Ht $R'5\' 2"'Hl$  (1.575 m)   Wt 187 lb (84.8 kg)   BMI 34.20 kg/m   Body mass index is 34.2 kg/m.  General appearance : Well developed well nourished female. No acute distress HEENT: Eyes: no retinal hemorrhage or exudates,  Neck supple, trachea midline, no carotid bruits, no thyroidmegaly Lungs: Clear to auscultation, no rhonchi or wheezes, or rib retractions  Heart: Regular rate and rhythm, no murmurs or gallops Breast:Examined in sitting and supine position were s/p bilateral mastectomy with reconstruction.  No palpable masses or tenderness,  no skin retraction, no nipple inversion, no nipple discharge, no skin discoloration, no axillary or supraclavicular lymphadenopathy Abdomen: no palpable masses or tenderness, no rebound or guarding Extremities: no edema or skin discoloration or tenderness  Pelvic: Vulva: Normal             Vagina: No gross lesions or discharge  Cervix: No gross lesions or discharge.  Pap reflex done.  Uterus  AV, normal size, shape and consistency, non-tender and mobile  Adnexa  Without masses or tenderness  Anus: Normal   Assessment/Plan:  69 y.o. female for annual exam   1. Encounter for routine gynecological examination with Papanicolaou smear of cervix Normal gynecologic exam in menopause.  Pap reflex done.  History of bilateral breast cancer.  Breast exam status post bilateral mastectomy with reconstruction.  Schedule colonoscopy through family  physician.  Health labs with family physician.  2. ASCUS of cervix with negative high risk HPV Pap reflex done today.  3. H/O LEEP Pap reflex done today.  4. Postmenopausal Well on no hormone replacement therapy.  5. Screening for osteoporosis Schedule bone density here now.  Vitamin D supplements, calcium intake of 1500 mg daily and regular weightbearing physical activities. - DG Bone Density; Future  6. Bilateral malignant neoplasm of breast in female, unspecified  estrogen receptor status, unspecified site of breast [facility]) Status post bilateral mastectomy with reconstruction.  7. Class 1 obesity due to excess calories with serious comorbidity and body mass index (BMI) of 34.0 to 34.9 in adult Recommend a lower calorie/carb diet.  Aerobic activities 5 times a week and light weightlifting every 2 days.  Princess Bruins MD, 3:57 PM 08/31/2020

## 2020-09-02 LAB — PAP IG W/ RFLX HPV ASCU

## 2020-09-04 ENCOUNTER — Encounter: Payer: Self-pay | Admitting: Obstetrics & Gynecology

## 2020-09-14 ENCOUNTER — Ambulatory Visit (INDEPENDENT_AMBULATORY_CARE_PROVIDER_SITE_OTHER): Payer: Medicare Other

## 2020-09-14 DIAGNOSIS — I428 Other cardiomyopathies: Secondary | ICD-10-CM | POA: Diagnosis not present

## 2020-09-14 LAB — CUP PACEART REMOTE DEVICE CHECK
Battery Remaining Longevity: 97 mo
Battery Remaining Percentage: 95.5 %
Battery Voltage: 3.01 V
Brady Statistic AP VP Percent: 6.3 %
Brady Statistic AP VS Percent: 1 %
Brady Statistic AS VP Percent: 93 %
Brady Statistic AS VS Percent: 1 %
Brady Statistic RA Percent Paced: 6 %
Date Time Interrogation Session: 20211116055555
Implantable Lead Implant Date: 20080307
Implantable Lead Implant Date: 20080307
Implantable Lead Implant Date: 20080307
Implantable Lead Location: 753858
Implantable Lead Location: 753859
Implantable Lead Location: 753860
Implantable Lead Model: 7001
Implantable Pulse Generator Implant Date: 20210216
Lead Channel Impedance Value: 330 Ohm
Lead Channel Impedance Value: 540 Ohm
Lead Channel Impedance Value: 710 Ohm
Lead Channel Pacing Threshold Amplitude: 0.5 V
Lead Channel Pacing Threshold Amplitude: 0.625 V
Lead Channel Pacing Threshold Amplitude: 0.75 V
Lead Channel Pacing Threshold Pulse Width: 0.5 ms
Lead Channel Pacing Threshold Pulse Width: 0.5 ms
Lead Channel Pacing Threshold Pulse Width: 0.8 ms
Lead Channel Sensing Intrinsic Amplitude: 12 mV
Lead Channel Sensing Intrinsic Amplitude: 5 mV
Lead Channel Setting Pacing Amplitude: 1.5 V
Lead Channel Setting Pacing Amplitude: 1.75 V
Lead Channel Setting Pacing Amplitude: 2 V
Lead Channel Setting Pacing Pulse Width: 0.5 ms
Lead Channel Setting Pacing Pulse Width: 0.8 ms
Lead Channel Setting Sensing Sensitivity: 2 mV
Pulse Gen Model: 3222
Pulse Gen Serial Number: 9105249

## 2020-09-16 NOTE — Progress Notes (Signed)
Remote pacemaker transmission.   

## 2020-09-20 ENCOUNTER — Ambulatory Visit (INDEPENDENT_AMBULATORY_CARE_PROVIDER_SITE_OTHER): Payer: Medicare Other | Admitting: Cardiovascular Disease

## 2020-09-20 ENCOUNTER — Ambulatory Visit (INDEPENDENT_AMBULATORY_CARE_PROVIDER_SITE_OTHER): Payer: Medicare Other

## 2020-09-20 ENCOUNTER — Other Ambulatory Visit: Payer: Self-pay

## 2020-09-20 ENCOUNTER — Encounter: Payer: Self-pay | Admitting: Cardiovascular Disease

## 2020-09-20 VITALS — BP 142/72 | HR 69 | Ht 62.0 in | Wt 187.6 lb

## 2020-09-20 DIAGNOSIS — I5032 Chronic diastolic (congestive) heart failure: Secondary | ICD-10-CM

## 2020-09-20 DIAGNOSIS — I480 Paroxysmal atrial fibrillation: Secondary | ICD-10-CM

## 2020-09-20 DIAGNOSIS — Z7901 Long term (current) use of anticoagulants: Secondary | ICD-10-CM

## 2020-09-20 DIAGNOSIS — I10 Essential (primary) hypertension: Secondary | ICD-10-CM

## 2020-09-20 DIAGNOSIS — Z95810 Presence of automatic (implantable) cardiac defibrillator: Secondary | ICD-10-CM | POA: Diagnosis not present

## 2020-09-20 DIAGNOSIS — E668 Other obesity: Secondary | ICD-10-CM

## 2020-09-20 LAB — POCT INR: INR: 2.8 (ref 2.0–3.0)

## 2020-09-20 NOTE — Patient Instructions (Signed)
Medication Instructions:  No change *If you need a refill on your cardiac medications before your next appointment, please call your pharmacy*   Lab Work: None ordered If you have labs (blood work) drawn today and your tests are completely normal, you will receive your results only by: [name] Kitchen MyChart Message (if you have MyChart) OR . A paper copy in the mail If you have any lab test that is abnormal or we need to change your treatment, we will call you to review the results.   Testing/Procedures: None ordered   Follow-Up: At [facility], you and your health needs are our priority.  As part of our continuing mission to provide you with exceptional heart care, we have created designated Provider Care Teams.  These Care Teams include your primary Cardiologist (physician) and Advanced Practice Providers (APPs -  Physician Assistants and Nurse Practitioners) who all work together to provide you with the care you need, when you need it.  We recommend signing up for the patient portal called "MyChart".  Sign up information is provided on this After Visit Summary.  MyChart is used to connect with patients for Virtual Visits (Telemedicine).  Patients are able to view lab/test results, encounter notes, upcoming appointments, etc.  Non-urgent messages can be sent to your provider as well.   To learn more about what you can do with MyChart, go to [email or web address].    Your next appointment:   12 month(s)  The format for your next appointment:   In Person  Provider:   Sanda Klein, MD

## 2020-09-20 NOTE — Progress Notes (Signed)
Cardiology Office Note    Date:  09/20/2020   ID:  Danielle Mckinney, DOB [birth year], MRN [id number]  PCP:  Maurice Small, MD  Cardiologist:  Cristopher Peru, M.D.; Sanda Klein, MD   Chief Complaint  Patient presents with  . Congestive Heart Failure    History of Present Illness:  Danielle Mckinney is a 69 y.o. 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  Clinically she is doing quite well.  She actually tries to exercise every day of the week.  She generally has no complaints except when she has to climb hills.  She became rather lightheaded and weak when she participated in a 5K walk-a-thon for the [facility] recently.  Otherwise she has no issues during activities of daily living or workouts at the gym.  She denies angina at rest or with activity.  She has not been troubled by syncope or palpitations or focal neurological complaints.  She denies lower extremity edema.  She takes 1 or 2 furosemide once or twice a month for edema.  Her blood pressure at home is typically around 130/70, although it is a little higher in the office today.  Her defibrillator reached elective replacement indicator and was changed out for a CRT-pacemaker in February.  She had complete recovery of left ventricular systolic function and was felt that she did not need a defibrillator any longer.  She has never received defibrillator therapies.  No new device is functioning normally.  Ventricular sensing is via the LV lead since she had noise artifact on the [facility] Riata RV lead implanted in 2008.  No evidence of fluid overload by thoracic impedance over the last many months.  She has 6% atrial pacing and more than 99% biventricular pacing (LV first by 35 ms).    Past Medical History:  Diagnosis Date  . Atrial  fibrillation ([place])   . Automatic implantable cardioverter-defibrillator in situ 08/25/2009   Qualifier: Diagnosis of  By: Lovena Le, MD, [facility], Binnie Kand    . Biventricular ICD (implantable cardioverter-defibrillator) in place    [employer]  . Breast cancer ([place])   . Cancer (HCC)    Breast cancer-BRACA I gene  . CHF 08/18/2009   Qualifier: Diagnosis of  By: Lynden Ang    . CHF (congestive heart failure) ([place])   . Chronic diastolic CHF (congestive heart failure) ([place]) 04/23/2012  . CIN I (cervical intraepithelial neoplasia I)   . CVA (cerebral vascular accident) ([place]) 01/15/2013  . Essential hypertension 08/18/2009   Qualifier: Diagnosis of  By: Lynden Ang    . Fibroid   . Fibroid uterus 04/15/2013  . 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  The report date is January 21, 1998.   [name] Kitchen HTN (hypertension)   . Long term current use of anticoagulant therapy 01/15/2013  . Nonischemic cardiomyopathy ([place])    related to anthracycline chemotherapy  . Stroke [facility])     Past Surgical History:  Procedure Laterality Date  . BIV ICD GENERTAOR CHANGE OUT  12/20/2011   [employer]  . BIV ICD GENERTAOR CHANGE OUT N/A 12/20/2011   Procedure: BIV ICD GENERTAOR CHANGE OUT;  Surgeon: Evans Lance, MD;  Location: [facility] CATH LAB;  Service: Cardiovascular;  Laterality: N/A;  . BIV PACEMAKER GENERATOR CHANGEOUT N/A 12/16/2019   Procedure: DOWNGRADE TO BIV  PACEMAKER;  Surgeon: Evans Lance, MD;  Location: [facility] CV LAB;  Service: Cardiovascular;  Laterality: N/A;  . BREAST SURGERY     Bilateral mastctomy and reconstruction TRAM  . CARDIAC DEFIBRILLATOR PLACEMENT    . CERVICAL CONE BIOPSY    . COLPOSCOPY    . GYNECOLOGIC CRYOSURGERY    . MASTECTOMY    . OOPHORECTOMY     BSO  . TRANSTHORACIC ECHOCARDIOGRAM  08/14/06  . TUBAL LIGATION    . US ECHOCARDIOGRAPHY  06/25/2012   borderline concentric LVH,LV systolic fx mildly  reduced,impaired LV relaxation    Current Medications: Outpatient Medications Prior to Visit  Medication Sig Dispense Refill  . Calcium-Magnesium-Vitamin D (CALCIUM 1200+D3 PO) Take 1 tablet by mouth daily.     . carvedilol (COREG) 6.25 MG tablet TAKE 1 AND 1/2 TABLETS BY MOUTH TWICE A DAY WITH FOOD (Patient taking differently: Take 9.375 mg by mouth 2 (two) times daily with a meal. ) 270 tablet 3  . Cholecalciferol (D3-1000) 1000 UNITS tablet Take 1,000 Units by mouth daily.    . furosemide (LASIX) 40 MG tablet TAKE 1 TABLET BY MOUTH EVERY DAY AS NEEDED FOR SWELLING 90 tablet 2  . Krill Oil 500 MG CAPS Take 500 mg by mouth daily.    [name] Kitchen losartan (COZAAR) 100 MG tablet TAKE 1/2 TABLET(50 MG) BY MOUTH TWICE DAILY 90 tablet 2  . OVER THE COUNTER MEDICATION Take 1 tablet by mouth daily. Olive Leaf and Oregano    . Polyethyl Glycol-Propyl Glycol (SYSTANE OP) Place 1 drop into both eyes 2 (two) times daily.    . potassium chloride SA (KLOR-CON) 20 MEQ tablet TAKE 1 TABLET BY MOUTH DAILY IF NEEDED(TAKE ALONG WITH FUROSEMIDE WHEN NEEDED) 30 tablet 3  . spironolactone (ALDACTONE) 25 MG tablet Take 1 tablet (25 mg total) by mouth daily. 90 tablet 3  . triamcinolone (KENALOG) 0.025 % ointment Apply 1 application topically 2 (two) times daily. (Patient taking differently: Apply 1 application topically 2 (two) times daily as needed (eczema). ) 30 g 0  . warfarin (COUMADIN) 4 MG tablet Take 4 mg by mouth on Monday, Wednesday and Friday and take 6 mg by mouth on Tuesday, Thursday, Saturday and Sunday 45 tablet 4   No facility-administered medications prior to visit.     Allergies:   Codeine, Lisinopril, Oxycodone, Penicillins, and Sulfa antibiotics   Social History   Socioeconomic History  . Marital status: Divorced    Spouse name: Not on file  . Number of children: Not on file  . Years of education: Not on file  . Highest education level: Not on file  Occupational History  . Not on file  Tobacco  Use  . Smoking status: Never Smoker  . Smokeless tobacco: Never Used  Vaping Use  . Vaping Use: Never used  Substance and Sexual Activity  . Alcohol use: No    Alcohol/week: 0.0 standard drinks  . Drug use: No  . Sexual activity: Not Currently    Birth control/protection: Surgical    Comment: interecourse age 3, sexual partners more than 5  Other Topics Concern  . Not on file  Social History Narrative  . Not on file   Social Determinants of Health   Financial Resource Strain:   . Difficulty of Paying Living Expenses: Not on file  Food Insecurity:   . Worried About Charity fundraiser in the Last Year: Not on file  . Ran Out of Food in the Last Year: Not  on file  Transportation Needs:   . Lack of Transportation (Medical): Not on file  . Lack of Transportation (Non-Medical): Not on file  Physical Activity:   . Days of Exercise per Week: Not on file  . Minutes of Exercise per Session: Not on file  Stress:   . Feeling of Stress : Not on file  Social Connections:   . Frequency of Communication with Friends and Family: Not on file  . Frequency of Social Gatherings with Friends and Family: Not on file  . Attends Religious Services: Not on file  . Active Member of Clubs or Organizations: Not on file  . Attends Archivist Meetings: Not on file  . Marital Status: Not on file     Family History:  The patient's family history includes Breast cancer in her sister; Cirrhosis in her father; Clotting disorder in her mother; Coronary artery disease in an other family member; Heart disease in her maternal grandfather; Hypertension in her brother and sister.   ROS:   Please see the history of present illness.   All other systems are reviewed and are negative.   PHYSICAL EXAM:   VS:  BP (!) 142/72   Pulse 69   Ht $R'5\' 2"'Xh$  (1.575 m)   Wt 187 lb 9.6 oz (85.1 kg)   SpO2 96%   BMI 34.31 kg/m       General: Alert, oriented x3, no distress, well-healed left subclavian  defibrillator site.  Moderately obese. Head: no evidence of trauma, PERRL, EOMI, no exophtalmos or lid lag, no myxedema, no xanthelasma; normal ears, nose and oropharynx Neck: normal jugular venous pulsations and no hepatojugular reflux; brisk carotid pulses without delay and no carotid bruits Chest: clear to auscultation, no signs of consolidation by percussion or palpation, normal fremitus, symmetrical and full respiratory excursions Cardiovascular: normal position and quality of the apical impulse, regular rhythm, normal first and second heart sounds, no murmurs, rubs or gallops Abdomen: no tenderness or distention, no masses by palpation, no abnormal pulsatility or arterial bruits, normal bowel sounds, no hepatosplenomegaly Extremities: no clubbing, cyanosis or edema; 2+ radial, ulnar and brachial pulses bilaterally; 2+ right femoral, posterior tibial and dorsalis pedis pulses; 2+ left femoral, posterior tibial and dorsalis pedis pulses; no subclavian or femoral bruits Neurological: grossly nonfocal Psych: Normal mood and affect   Wt Readings from Last 3 Encounters:  09/20/20 187 lb 9.6 oz (85.1 kg)  08/31/20 187 lb (84.8 kg)  03/31/20 183 lb (83 kg)      Studies/Labs Reviewed:   ECHO 12/09/2019: Study Result  1. Left ventricular ejection fraction, by estimation, is 55 to 60%. The left ventricle has normal function. The left ventrical has no regional wall motion abnormalities. Left ventricular diastolic parameters are  consistent with Grade I diastolic dysfunction (impaired relaxation). Elevated left ventricular pressure.  2. Right ventricular systolic function is normal. The right ventricular size is normal. There is mildly elevated pulmonary artery systolic pressure.  3. Left atrial size was mildly dilated.  4. Trivial mitral valve regurgitation.  5. The aortic valve is normal in structure and function. Aortic valve regurgitation is not visualized. No aortic stenosis is  present.  6. The inferior vena cava is normal in size with greater than 50% respiratory variability, suggesting right atrial pressure of 3 mmHg.    EKG:  EKG is ordered today.  It shows atrial sensed (sinus), biventricular paced rhythm with a rather small R wave in lead V1 but with a and a very narrow  QRS at 122 ms.  QTc 456 ms.  Recent Labs: 12/08/2019: BUN 16; Creatinine, Ser 0.78; Hemoglobin 12.4; Potassium 4.3; Sodium 141 12/16/2019: Platelets 232    ASSESSMENT:    1. Chronic diastolic heart failure ([place])   2. Biventricular ICD (implantable cardioverter-defibrillator) in place   3. Paroxysmal atrial fibrillation (HCC)   4. Long term current use of anticoagulant therapy   5. Essential hypertension   6. Moderate obesity     PLAN:  In order of problems listed above:  1. CHF: Euvolemic and excellent functional capacity on ARB, carvedilol and occasional doses of loop diuretic.  Excellent recovery in LV systolic function, back to normal range with CRT.  Residual problems with dyspnea could well be related to obesity. 2. CRT-P: Recent generator change out, downgrade from ICD. 3. AFib: Although she has a remote history of embolic stroke, its been over 3 years since we last saw atrial fibrillation on her device checks.  None has been seen on the current generator since change out.  CHADSVasc 5 (gender, hypertension, heart failure, previous stroke).   4. Warfarin: INR in therapeutic range at 2.8 today.  She has not had falls, injuries or serious bleeding problems.  5. HTN: Blood pressure a little high today, better at home. 6. Obesity: Remains moderately obese, but continues her efforts at physical activity and caloric restriction.    Medication Adjustments/Labs and Tests Ordered: Current medicines are reviewed at length with the patient today.  Concerns regarding medicines are outlined above.  Medication changes, Labs and Tests ordered today are listed in the Patient Instructions  below. Patient Instructions  Medication Instructions:  No change *If you need a refill on your cardiac medications before your next appointment, please call your pharmacy*   Lab Work: None ordered If you have labs (blood work) drawn today and your tests are completely normal, you will receive your results only by: [name] Kitchen MyChart Message (if you have MyChart) OR . A paper copy in the mail If you have any lab test that is abnormal or we need to change your treatment, we will call you to review the results.   Testing/Procedures: None ordered   Follow-Up: At [facility], you and your health needs are our priority.  As part of our continuing mission to provide you with exceptional heart care, we have created designated Provider Care Teams.  These Care Teams include your primary Cardiologist (physician) and Advanced Practice Providers (APPs -  Physician Assistants and Nurse Practitioners) who all work together to provide you with the care you need, when you need it.  We recommend signing up for the patient portal called "MyChart".  Sign up information is provided on this After Visit Summary.  MyChart is used to connect with patients for Virtual Visits (Telemedicine).  Patients are able to view lab/test results, encounter notes, upcoming appointments, etc.  Non-urgent messages can be sent to your provider as well.   To learn more about what you can do with MyChart, go to [email or web address].    Your next appointment:   12 month(s)  The format for your next appointment:   In Person  Provider:   Sanda Klein, MD      Signed, Sanda Klein, MD  09/20/2020 5:03 PM    [facility] [address], [place], [place]  [address] Phone: [phone]; Fax: [phone]

## 2020-09-20 NOTE — Patient Instructions (Signed)
Continue taking 1.5 tablets daily except 1 tablet each Monday, Wednesday, and Friday.  Recheck INR in 6 weeks. Eat greens today or tomorrow.

## 2020-09-29 ENCOUNTER — Ambulatory Visit (INDEPENDENT_AMBULATORY_CARE_PROVIDER_SITE_OTHER): Payer: Medicare Other

## 2020-09-29 DIAGNOSIS — I5032 Chronic diastolic (congestive) heart failure: Secondary | ICD-10-CM

## 2020-09-29 DIAGNOSIS — Z95810 Presence of automatic (implantable) cardiac defibrillator: Secondary | ICD-10-CM | POA: Diagnosis not present

## 2020-10-01 ENCOUNTER — Telehealth: Payer: Self-pay | Admitting: Internal Medicine

## 2020-10-01 NOTE — Telephone Encounter (Signed)
Spoke with pt, she reports itch and redness at incision.  Skin is intact, she denies and warmth or fever.  Pt will try to send a picture, provided her with e-mail address to send it to.  Advsed pt to apply benadryl or moisturizing lotion to site, continue to monitor closely and report and changes.

## 2020-10-01 NOTE — Telephone Encounter (Signed)
Patient states she is having itching and red irritation on the site where the battery for her pacemaker was put in. She states she is not having any other symptoms. Please advise.

## 2020-10-01 NOTE — Progress Notes (Signed)
EPIC Encounter for ICM Monitoring  Patient Name: Danielle Mckinney is a 69 y.o. female Date: 10/01/2020 Primary Care Physican: Maurice Small, MD Primary Cardiologist: Croitoru Electrophysiologist: Santina Evans Pacing: >99% 9/22/2021Weight:183lbs  AT/AF Burden:0% - takes Warfarin  Transmission reviewed.   CorvueThoracic impedancenormal.  Prescribed:  Furosemide 40 mgTake 40 mg by mouth daily as needed for edema.Takes a few days a month.  Potassium 20 mEqTake 20 mEq by mouth daily as needed (when taking furosemide).  Warfarin as directed  Recommendations: No changes.  Follow-up plan: ICM clinic phone appointment on 11/02/2020.91day device clinic remote transmission2/15/2022.   EP/Cardiology Office Visits: Recall for5/29/2022 with Dr Lovena Le.   Copy of ICM check sent to Dr.Taylor   3 month ICM trend: 09/29/2020    1 Year ICM trend:       [name], RN 10/01/2020 4:55 PM

## 2020-10-10 NOTE — Progress Notes (Signed)
[place]  Telephone:(336) [phone] Fax:(336) [phone]     ID: Danielle Mckinney DOB: 1951-04-28  MR#: [id number]  XFG#:[id number]  Patient Care Team: Danielle Small, MD as PCP - General (Family Medicine) Mckinney, Danielle Gobble, MD as PCP - Cardiology (Cardiology) Welby Mckinney, Danielle Dad, MD as Consulting Physician (Oncology) Mckinney, Danielle Gobble, MD as Consulting Physician (Cardiology) Danielle Bruins, MD as Consulting Physician (Obstetrics and Gynecology) Danielle Planas, MD as Consulting Physician (Orthopedic Surgery) OTHER MD:  CHIEF COMPLAINT:  BRCA positive breast cancer (s/p bilateral mastectomies)  CURRENT TREATMENT:  Observation   INTERVAL HISTORY: Danielle Mckinney returns today for follow up of her remote BRCA related breast cancer.  She continues under observation.  Since her last visit, she underwent echocardiography 12/09/2019 showing an ejection fraction in the 55-60% with no regional wall motion abnormalities.. She is scheduled for bone density screening on 10/26/2020.   REVIEW OF SYSTEMS: Danielle Mckinney continues to exercise regularly.  She does line dancing at least twice a week and she does Silver sneakers 3 times a week.  She did the 5K recently.  She says when she goes up stairs or climbs a slope she feels extremely short of breath.  Otherwise she is not limited.  She is very excited that her fourth grandchild will be coming in February.  This is her younger daughter's first child.  She is living in [place].  The other daughter is now living in [place].   COVID 19 VACCINATION STATUS: Status post Moderna x2+ booster November 2021   BREAST CANCER HISTORY: From the earlier summary note:   Danielle Mckinney is referred back by her primary care physician, Dr. Justin Mckinney, because of a rise in her breast cancer tumor marker. To recap : [name] is status post bilateral breast cancers, not synchronous, and bilateral mastectomies with reconstruction. She had stage IV disease , and was treated aggressively with  high-dose chemotherapy and I'll ago his stem cell transplant support at [facility] in 1998. She has remained disease-free since that time , but did have a significant cardiomyopathy, which now appears to have largely resolved. She also had a right body stroke , with minimal residuals.   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 One of her 3 sisters did die from breast cancer at an early age and one of the patient's 2 daughters has been found to carry the mutation.  Her breast cancer history is given in more detail below    PAST MEDICAL HISTORY: Past Medical History:  Diagnosis Date  . Atrial fibrillation ([place])   . Automatic implantable cardioverter-defibrillator in situ 08/25/2009   Qualifier: Diagnosis of  By: Lovena Le, MD, [facility], Binnie Kand    . Biventricular ICD (implantable cardioverter-defibrillator) in place    St.Jude  . Breast cancer ([place])   . Cancer (HCC)    Breast cancer-BRACA I gene  . CHF 08/18/2009   Qualifier: Diagnosis of  By: Lynden Ang    . CHF (congestive heart failure) ([place])   . Chronic diastolic CHF (congestive heart failure) ([place]) 04/23/2012  . CIN I (cervical intraepithelial neoplasia I)   . CVA (cerebral vascular accident) ([place]) 01/15/2013  . Essential hypertension 08/18/2009   Qualifier: Diagnosis of  By: Lynden Ang    . Fibroid   . Fibroid uterus 04/15/2013  . Genetic testing 07/06/2015   BRCA1 c.191G>A (C64Y) pathogenic mutation found at [facility] at [facility].  The  report date is January 21, 1998.   [name] Kitchen HTN (hypertension)   . Long term current use of anticoagulant therapy 01/15/2013  . 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 Stroke [facility])     PAST SURGICAL HISTORY: Past Surgical History:  Procedure Laterality Date  . BIV ICD GENERTAOR CHANGE OUT  12/20/2011   St.Jude  . BIV ICD GENERTAOR CHANGE OUT N/A  12/20/2011   Procedure: BIV ICD GENERTAOR CHANGE OUT;  Surgeon: Evans Lance, MD;  Location: [facility] CATH LAB;  Service: Cardiovascular;  Laterality: N/A;  . BIV PACEMAKER GENERATOR CHANGEOUT N/A 12/16/2019   Procedure: DOWNGRADE TO BIV PACEMAKER;  Surgeon: Evans Lance, MD;  Location: [facility] CV LAB;  Service: Cardiovascular;  Laterality: N/A;  . BREAST SURGERY     Bilateral mastctomy and reconstruction TRAM  . CARDIAC DEFIBRILLATOR PLACEMENT    . CERVICAL CONE BIOPSY    . COLPOSCOPY    . GYNECOLOGIC CRYOSURGERY    . MASTECTOMY    . OOPHORECTOMY     BSO  . TRANSTHORACIC ECHOCARDIOGRAM  08/14/06  . TUBAL LIGATION    . US ECHOCARDIOGRAPHY  06/25/2012   borderline concentric LVH,LV systolic fx mildly reduced,impaired LV relaxation    FAMILY HISTORY Family History  Problem Relation Age of Onset  . Breast cancer Sister        Age 5  . Hypertension Sister   . Heart disease Maternal Grandfather   . Clotting disorder Mother   . Cirrhosis Father   . Coronary artery disease Other   . Hypertension Brother    The patient's father died in his late 65s from cirrhosis of the liver. The patient's mother died at the age of 53 secondary to a blood clot. Danielle Mckinney had 3 brothers, 3 sisters. One sister died from breast cancer. But Danielle Mckinney does not recall the exact age of diagnosis. One paternal aunt died with breast and ovarian cancer. The patient is known to be BRCA positive as is one of her 2 daughters   GYNECOLOGIC HISTORY:  No LMP recorded. Patient is postmenopausal.  menarche age 31, first live birth age 89 as she is [place] P2. She had a total abdominal hysterectomy with bilateral salpingo-oophorectomy   SOCIAL HISTORY:  Danielle Mckinney is divorced and lives alone, with no pets. Her daughter Danielle Mckinney moved back to town September 2018; younger daughter Danielle Mckinney works as an Therapist, sports in [place]. She proved to be BRCA positive (status post TAH-BSO 2000) The patient has 3 grandchildren and one expected 12/27/2020. One  granddaughter is an Therapist, sports in Engineer, maintenance (IT) at [employer].    ADVANCED DIRECTIVES:  In place; her daughter Danielle Mckinney is her healthcare power of attorney   HEALTH MAINTENANCE: Social History  Substance Use Topics  . Smoking status: Never Smoker  . Smokeless tobacco: Never Used  . Alcohol use No     Colonoscopy: 11/2018 (virtual, Dr. Penelope Coop)  PAP: July 2016  Bone density: 05/27/2015/normal  Allergies  Allergen Reactions  . Codeine Nausea And Vomiting  . Lisinopril Cough  . Oxycodone Nausea And Vomiting  . Penicillins Nausea And Vomiting    Did it involve swelling of the face/tongue/throat, SOB, or low BP? No Did it involve sudden or severe rash/hives, skin peeling, or any reaction on the inside of your mouth or nose? No Did you need to seek medical attention at a hospital or doctor's office? No When did it last happen? If all above answers are "NO", may proceed with cephalosporin use.   [name] Kitchen  Sulfa Antibiotics Nausea And Vomiting    No outpatient medications have been marked as taking for the 10/11/20 encounter (Office Visit) with Royale Swamy, Danielle Dad, MD.   Current Outpatient Medications  Medication Sig Dispense Refill  . Calcium-Magnesium-Vitamin D (CALCIUM 1200+D3 PO) Take 1 tablet by mouth daily.     . carvedilol (COREG) 6.25 MG tablet TAKE 1 AND 1/2 TABLETS BY MOUTH TWICE A DAY WITH FOOD (Patient taking differently: Take 9.375 mg by mouth 2 (two) times daily with a meal. ) 270 tablet 3  . Cholecalciferol (D3-1000) 1000 UNITS tablet Take 1,000 Units by mouth daily.    . furosemide (LASIX) 40 MG tablet TAKE 1 TABLET BY MOUTH EVERY DAY AS NEEDED FOR SWELLING 90 tablet 2  . Krill Oil 500 MG CAPS Take 500 mg by mouth daily.    [name] Kitchen losartan (COZAAR) 100 MG tablet TAKE 1/2 TABLET(50 MG) BY MOUTH TWICE DAILY 90 tablet 2  . OVER THE COUNTER MEDICATION Take 1 tablet by mouth daily. Olive Leaf and Oregano    . Polyethyl Glycol-Propyl Glycol (SYSTANE OP) Place 1 drop into both eyes 2 (two) times  daily.    . potassium chloride SA (KLOR-CON) 20 MEQ tablet TAKE 1 TABLET BY MOUTH DAILY IF NEEDED(TAKE ALONG WITH FUROSEMIDE WHEN NEEDED) 30 tablet 3  . spironolactone (ALDACTONE) 25 MG tablet Take 1 tablet (25 mg total) by mouth daily. 90 tablet 3  . triamcinolone (KENALOG) 0.025 % ointment Apply 1 application topically 2 (two) times daily. (Patient taking differently: Apply 1 application topically 2 (two) times daily as needed (eczema). ) 30 g 0  . warfarin (COUMADIN) 4 MG tablet Take 4 mg by mouth on Monday, Wednesday and Friday and take 6 mg by mouth on Tuesday, Thursday, Saturday and Sunday 45 tablet 4   No current facility-administered medications for this visit.    OBJECTIVE: African-American woman who appears well  Vitals:   10/11/20 1342  BP: 140/73  Pulse: 75  Resp: 18  Temp: (!) 97.4 F (36.3 C)  SpO2: 98%   Wt Readings from Last 3 Encounters:  10/11/20 183 lb 8 oz (83.2 kg)  09/20/20 187 lb 9.6 oz (85.1 kg)  08/31/20 187 lb (84.8 kg)   Body mass index is 33.56 kg/m.    ECOG FS:1 - Symptomatic but completely ambulatory  Sclerae unicteric, EOMs intact Wearing a mask No cervical or supraclavicular adenopathy Lungs no rales or rhonchi Heart regular rate and rhythm Abd soft, nontender, positive bowel sounds MSK no focal spinal tenderness Neuro: nonfocal, well oriented, appropriate affect Breasts: Status post bilateral mastectomies with bilateral TRAM reconstruction.  There is no evidence of local recurrence Skin: The skin in the area over the pacemaker on the upper left chest is slightly hyperpigmented suggesting current irritation.  There is no evidence of cellulitis and specifically no swelling or tenderness.   LAB RESULTS:  CMP     Component Value Date/Time   NA 141 12/08/2019 1447   NA 139 07/12/2017 1445   K 4.3 12/08/2019 1447   K 4.2 07/12/2017 1445   CL 103 12/08/2019 1447   CO2 20 12/08/2019 1447   CO2 26 07/12/2017 1445   GLUCOSE 84 12/08/2019  1447   GLUCOSE 108 (H) 08/05/2019 1447   GLUCOSE 103 07/12/2017 1445   BUN 16 12/08/2019 1447   BUN 12.7 07/12/2017 1445   CREATININE 0.78 12/08/2019 1447   CREATININE 0.8 07/12/2017 1445   CALCIUM 10.2 12/08/2019 1447   CALCIUM 9.9 07/12/2017 1445  PROT 7.6 08/05/2019 1447   PROT 7.3 07/12/2017 1445   ALBUMIN 4.1 08/05/2019 1447   ALBUMIN 3.9 07/12/2017 1445   AST 23 08/05/2019 1447   AST 19 07/12/2017 1445   ALT 13 08/05/2019 1447   ALT 8 07/12/2017 1445   ALKPHOS 62 08/05/2019 1447   ALKPHOS 61 07/12/2017 1445   BILITOT 0.6 08/05/2019 1447   BILITOT 0.54 07/12/2017 1445   GFRNONAA 79 12/08/2019 1447   GFRAA 92 12/08/2019 1447    CBC    Component Value Date/Time   WBC 7.3 10/11/2020 1326   RBC 4.68 10/11/2020 1326   HGB 12.3 10/11/2020 1326   HGB 12.4 12/08/2019 1447   HGB 12.2 07/12/2017 1445   HCT 36.9 10/11/2020 1326   HCT 37.2 12/08/2019 1447   HCT 36.8 07/12/2017 1445   PLT 227 10/11/2020 1326   PLT 230 07/17/2017 1549   MCV 78.8 (L) 10/11/2020 1326   MCV 79 12/08/2019 1447   MCV 79.4 (L) 07/12/2017 1445   MCH 26.3 10/11/2020 1326   MCHC 33.3 10/11/2020 1326   RDW 15.1 10/11/2020 1326   RDW 15.3 12/08/2019 1447   RDW 15.9 (H) 07/12/2017 1445   LYMPHSABS 2.2 10/11/2020 1326   LYMPHSABS 2.4 12/08/2019 1447   LYMPHSABS 2.2 07/12/2017 1445   MONOABS 0.7 10/11/2020 1326   MONOABS 0.7 07/12/2017 1445   EOSABS 0.1 10/11/2020 1326   EOSABS 0.3 12/08/2019 1447   BASOSABS 0.0 10/11/2020 1326   BASOSABS 0.0 12/08/2019 1447   BASOSABS 0.0 07/12/2017 1445       Chemistry    CMP Latest Ref Rng & Units 12/08/2019 08/05/2019 07/11/2018  Glucose 65 - 99 mg/dL 84 108(H) 106(H)  BUN 8 - 27 mg/dL $Remove'16 21 13  'VCOmXNT$ Creatinine 0.57 - 1.00 mg/dL 0.78 1.04(H) 0.82  Sodium 134 - 144 mmol/L 141 139 140  Potassium 3.5 - 5.2 mmol/L 4.3 4.0 4.1  Chloride 96 - 106 mmol/L 103 105 106  CO2 20 - 29 mmol/L $RemoveB'20 24 27  'FuEHQWwy$ Calcium 8.7 - 10.3 mg/dL 10.2 9.8 9.8  Total Protein 6.5 - 8.1 g/dL -  7.6 7.2  Total Bilirubin 0.3 - 1.2 mg/dL - 0.6 0.8  Alkaline Phos 38 - 126 U/L - 62 63  AST 15 - 41 U/L - 23 31  ALT 0 - 44 U/L - 13 19     Urinalysis    Component Value Date/Time   COLORURINE YELLOW 08/08/2008 1859   APPEARANCEUR CLEAR 08/08/2008 1859   LABSPEC 1.012 08/08/2008 1859   PHURINE 6.0 08/08/2008 1859   GLUCOSEU NEGATIVE 08/08/2008 1859   HGBUR NEGATIVE 08/08/2008 1859   BILIRUBINUR NEGATIVE 08/08/2008 1859   KETONESUR NEGATIVE 08/08/2008 1859   PROTEINUR NEGATIVE 08/08/2008 1859   UROBILINOGEN 0.2 08/08/2008 1859   NITRITE NEGATIVE 08/08/2008 1859   LEUKOCYTESUR  08/08/2008 1859    NEGATIVE MICROSCOPIC NOT DONE ON URINES WITH NEGATIVE PROTEIN, BLOOD, LEUKOCYTES, NITRITE, OR GLUCOSE <1000 mg/dL.    STUDIES: CUP PACEART REMOTE DEVICE CHECK  Result Date: 09/14/2020 Scheduled remote reviewed. Normal device function. Next remote 91 days- JBox, RN/CVRS    ASSESSMENT: 69 y.o. BRCA positive [place] woman  (1) s/p Right lumpectomy in 1990 for a Stage II breast cancer treated with doxorubicin, cyclophosphamide and 5- fluorouracil, followed by radiation.  (2) Left lumpectomy in April of 1998 for a "Stage IV" tumor (she had left supraclavicular involvement which currently would be staged as III-C),  Estrogen and progesterone receptor negative, HER2 unknown  (a) treated with  doxorubicin and paclitaxel followed by high-dose chemotherapy at [facility] with stem cell rescue  in December of 1998   (b) received radiation to the left supraclavicular, left axillary and left chest wall areas, completed March of 1999   (3) BRCA positivity:  (a) s/p bilateral mastectomies with flap reconstruction January 2000 with no evidence of residual disease.  (b) status post remote total abdominal hysterectomy with bilateral salpingo-oophorectomy  (4) cardiomyopathy likely related to her prior chemotherapy  (a) echo  04/14/2014 shows an ejection fraction in the 60-65% range  (b)   Grade 1 diastolic dysfunction  (c) echo February 2021 shows an ejection fraction in the 55-60% range.  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  (a)  on lifelong anticoagulation  (6) alpha thalassemia trait, with an MCV in the high 70s chronically   PLAN: Vin[place] is now 23 years out from definitive surgery for her breast cancer with no evidence of disease recurrence.  This is very favorable.  Her nonischemic cardiomyopathy appears to be stable.  She has few limitations although does report significant shortness of breath when walking up a slope or up stairs  I think the irritation around the pacemaker area on her skin is going to be due to her seatbelt.  I gave her a pillow to put over that area when she drives.  Hopefully that will take care of the problem.   She will see me again next year.  She knows to call for any other issue that may develop before then  Total encounter time 20 minutes.  Danielle Mckinney, Danielle Dad, MD  10/11/20 1:45 PM Medical Oncology and Hematology [facility] [place], [place] [address] Tel. [phone]    Fax. [phone]   I, Wilburn Mylar, am acting as scribe for Dr. Virgie Dad. Danielle Mckinney.  I, Lurline Del MD, have reviewed the above documentation for accuracy and completeness, and I agree with the above.    *Total Encounter Time as defined by the Centers for Medicare and Medicaid Services includes, in addition to the face-to-face time of a patient visit (documented in the note above) non-face-to-face time: obtaining and reviewing outside history, ordering and reviewing medications, tests or procedures, care coordination (communications with other health care professionals or caregivers) and documentation in the medical record.

## 2020-10-11 ENCOUNTER — Inpatient Hospital Stay: Payer: Medicare Other | Attending: Oncology

## 2020-10-11 ENCOUNTER — Inpatient Hospital Stay (HOSPITAL_BASED_OUTPATIENT_CLINIC_OR_DEPARTMENT_OTHER): Payer: Medicare Other | Admitting: Oncology

## 2020-10-11 ENCOUNTER — Other Ambulatory Visit: Payer: Self-pay

## 2020-10-11 VITALS — BP 140/73 | HR 75 | Temp 97.4°F | Resp 18 | Ht 62.0 in | Wt 183.5 lb

## 2020-10-11 DIAGNOSIS — Z90710 Acquired absence of both cervix and uterus: Secondary | ICD-10-CM | POA: Insufficient documentation

## 2020-10-11 DIAGNOSIS — Z15060 Genetic susceptibility to colorectal cancer: Secondary | ICD-10-CM

## 2020-10-11 DIAGNOSIS — Z90722 Acquired absence of ovaries, bilateral: Secondary | ICD-10-CM | POA: Insufficient documentation

## 2020-10-11 DIAGNOSIS — Z1501 Genetic susceptibility to malignant neoplasm of breast: Secondary | ICD-10-CM | POA: Insufficient documentation

## 2020-10-11 DIAGNOSIS — Z7901 Long term (current) use of anticoagulants: Secondary | ICD-10-CM | POA: Insufficient documentation

## 2020-10-11 DIAGNOSIS — Z9013 Acquired absence of bilateral breasts and nipples: Secondary | ICD-10-CM | POA: Insufficient documentation

## 2020-10-11 DIAGNOSIS — R0602 Shortness of breath: Secondary | ICD-10-CM | POA: Insufficient documentation

## 2020-10-11 DIAGNOSIS — D563 Thalassemia minor: Secondary | ICD-10-CM | POA: Insufficient documentation

## 2020-10-11 DIAGNOSIS — Z853 Personal history of malignant neoplasm of breast: Secondary | ICD-10-CM | POA: Insufficient documentation

## 2020-10-11 DIAGNOSIS — C50912 Malignant neoplasm of unspecified site of left female breast: Secondary | ICD-10-CM

## 2020-10-11 DIAGNOSIS — I428 Other cardiomyopathies: Secondary | ICD-10-CM

## 2020-10-11 DIAGNOSIS — L989 Disorder of the skin and subcutaneous tissue, unspecified: Secondary | ICD-10-CM | POA: Insufficient documentation

## 2020-10-11 DIAGNOSIS — Z803 Family history of malignant neoplasm of breast: Secondary | ICD-10-CM | POA: Insufficient documentation

## 2020-10-11 DIAGNOSIS — Z923 Personal history of irradiation: Secondary | ICD-10-CM | POA: Diagnosis not present

## 2020-10-11 DIAGNOSIS — C50911 Malignant neoplasm of unspecified site of right female breast: Secondary | ICD-10-CM

## 2020-10-11 DIAGNOSIS — Z79899 Other long term (current) drug therapy: Secondary | ICD-10-CM | POA: Diagnosis not present

## 2020-10-11 DIAGNOSIS — C50511 Malignant neoplasm of lower-outer quadrant of right female breast: Secondary | ICD-10-CM

## 2020-10-11 DIAGNOSIS — Z171 Estrogen receptor negative status [ER-]: Secondary | ICD-10-CM

## 2020-10-11 DIAGNOSIS — Z8673 Personal history of transient ischemic attack (TIA), and cerebral infarction without residual deficits: Secondary | ICD-10-CM | POA: Insufficient documentation

## 2020-10-11 DIAGNOSIS — Z9221 Personal history of antineoplastic chemotherapy: Secondary | ICD-10-CM | POA: Insufficient documentation

## 2020-10-11 LAB — CBC WITH DIFFERENTIAL/PLATELET
Abs Immature Granulocytes: 0.02 10*3/uL (ref 0.00–0.07)
Basophils Absolute: 0 10*3/uL (ref 0.0–0.1)
Basophils Relative: 0 %
Eosinophils Absolute: 0.1 10*3/uL (ref 0.0–0.5)
Eosinophils Relative: 2 %
HCT: 36.9 % (ref 36.0–46.0)
Hemoglobin: 12.3 g/dL (ref 12.0–15.0)
Immature Granulocytes: 0 %
Lymphocytes Relative: 30 %
Lymphs Abs: 2.2 10*3/uL (ref 0.7–4.0)
MCH: 26.3 pg (ref 26.0–34.0)
MCHC: 33.3 g/dL (ref 30.0–36.0)
MCV: 78.8 fL — ABNORMAL LOW (ref 80.0–100.0)
Monocytes Absolute: 0.7 10*3/uL (ref 0.1–1.0)
Monocytes Relative: 10 %
Neutro Abs: 4.2 10*3/uL (ref 1.7–7.7)
Neutrophils Relative %: 58 %
Platelets: 227 10*3/uL (ref 150–400)
RBC: 4.68 MIL/uL (ref 3.87–5.11)
RDW: 15.1 % (ref 11.5–15.5)
WBC: 7.3 10*3/uL (ref 4.0–10.5)
nRBC: 0 % (ref 0.0–0.2)

## 2020-10-11 LAB — COMPREHENSIVE METABOLIC PANEL
ALT: 17 U/L (ref 0–44)
AST: 26 U/L (ref 15–41)
Albumin: 4.1 g/dL (ref 3.5–5.0)
Alkaline Phosphatase: 71 U/L (ref 38–126)
Anion gap: 8 (ref 5–15)
BUN: 20 mg/dL (ref 8–23)
CO2: 26 mmol/L (ref 22–32)
Calcium: 10.3 mg/dL (ref 8.9–10.3)
Chloride: 104 mmol/L (ref 98–111)
Creatinine, Ser: 0.96 mg/dL (ref 0.44–1.00)
GFR, Estimated: >60 mL/min (ref >60)
Glucose, Bld: 103 mg/dL — ABNORMAL HIGH (ref 70–99)
Potassium: 4.1 mmol/L (ref 3.5–5.1)
Sodium: 138 mmol/L (ref 135–145)
Total Bilirubin: 0.8 mg/dL (ref 0.3–1.2)
Total Protein: 7.9 g/dL (ref 6.5–8.1)

## 2020-10-11 NOTE — Progress Notes (Signed)
Thank you, Gus!  I fully agree with your recommendation.  Most importantly, she does need to continue wearing the seatbelt!

## 2020-10-12 LAB — CANCER ANTIGEN 27.29: CA 27.29: 25.1 U/mL (ref 0.0–38.6)

## 2020-10-26 ENCOUNTER — Ambulatory Visit (INDEPENDENT_AMBULATORY_CARE_PROVIDER_SITE_OTHER): Payer: Medicare Other

## 2020-10-26 ENCOUNTER — Other Ambulatory Visit: Payer: Self-pay

## 2020-10-26 ENCOUNTER — Other Ambulatory Visit: Payer: Self-pay | Admitting: Obstetrics & Gynecology

## 2020-10-26 DIAGNOSIS — Z780 Asymptomatic menopausal state: Secondary | ICD-10-CM | POA: Diagnosis not present

## 2020-10-26 DIAGNOSIS — Z13820 Encounter for screening for osteoporosis: Secondary | ICD-10-CM

## 2020-11-02 ENCOUNTER — Ambulatory Visit (INDEPENDENT_AMBULATORY_CARE_PROVIDER_SITE_OTHER): Payer: Medicare Other

## 2020-11-02 DIAGNOSIS — I5032 Chronic diastolic (congestive) heart failure: Secondary | ICD-10-CM | POA: Diagnosis not present

## 2020-11-02 DIAGNOSIS — Z95810 Presence of automatic (implantable) cardiac defibrillator: Secondary | ICD-10-CM | POA: Diagnosis not present

## 2020-11-03 ENCOUNTER — Other Ambulatory Visit: Payer: Self-pay

## 2020-11-03 ENCOUNTER — Ambulatory Visit (INDEPENDENT_AMBULATORY_CARE_PROVIDER_SITE_OTHER): Payer: Medicare Other

## 2020-11-03 DIAGNOSIS — Z7901 Long term (current) use of anticoagulants: Secondary | ICD-10-CM

## 2020-11-03 DIAGNOSIS — I480 Paroxysmal atrial fibrillation: Secondary | ICD-10-CM | POA: Diagnosis not present

## 2020-11-03 LAB — POCT INR: INR: 1.6 — AB (ref 2.0–3.0)

## 2020-11-03 NOTE — Patient Instructions (Signed)
Take 2 tablets tonight and then Continue taking 1.5 tablets daily except 1 tablet each Monday, Wednesday, and Friday.  Recheck INR in 4 weeks.

## 2020-11-05 ENCOUNTER — Telehealth: Payer: Self-pay

## 2020-11-05 NOTE — Progress Notes (Signed)
EPIC Encounter for ICM Monitoring  Patient Name: Danielle Mckinney is a 69 y.o. female Date: 11/05/2020 Primary Care Physican: Maurice Small, MD Primary Cardiologist: Croitoru Electrophysiologist: Santina Evans Pacing: >99% 9/22/2021Weight:183lbs  AT/AF Burden:0% - takes Warfarin  Attempted call to patient and unable to reach.  Left detailed message per DPR regarding transmission. Transmission reviewed.   CorvueThoracic impedancenormal.  Prescribed:  Furosemide 40 mgTake 40 mg by mouth daily as needed for edema.Takes a few days a month.  Potassium 20 mEqTake 20 mEq by mouth daily as needed (when taking furosemide).  Spironolactone 25 mg take 1 tablet daily  Warfarin as directed  Recommendations: Left voice mail with ICM number and encouraged to call if experiencing any fluid symptoms.  Follow-up plan: ICM clinic phone appointment on2/05/2021.91day device clinic remote transmission2/15/2022.   EP/Cardiology Office Visits: Recall for5/29/2022 with Dr Lovena Le.   Copy of ICM check sent to Dr.Taylor   3 month ICM trend: 11/02/2020.    1 Year ICM trend:       [name], RN 11/05/2020 12:59 PM

## 2020-11-05 NOTE — Telephone Encounter (Signed)
Remote ICM transmission received.  Attempted call to patient regarding ICM remote transmission and left detailed message per DPR.  Advised to return call for any fluid symptoms or questions.  

## 2020-11-24 ENCOUNTER — Other Ambulatory Visit: Payer: Self-pay | Admitting: Cardiovascular Disease

## 2020-12-03 ENCOUNTER — Ambulatory Visit (INDEPENDENT_AMBULATORY_CARE_PROVIDER_SITE_OTHER): Payer: Medicare Other

## 2020-12-03 ENCOUNTER — Other Ambulatory Visit: Payer: Self-pay

## 2020-12-03 DIAGNOSIS — Z7901 Long term (current) use of anticoagulants: Secondary | ICD-10-CM

## 2020-12-03 DIAGNOSIS — I480 Paroxysmal atrial fibrillation: Secondary | ICD-10-CM

## 2020-12-03 LAB — POCT INR: INR: 3.1 — AB (ref 2.0–3.0)

## 2020-12-03 NOTE — Patient Instructions (Signed)
Hold tonight only and then Continue taking 1.5 tablets daily except 1 tablet each Monday, Wednesday, and Friday.  Recheck INR in 3 weeks.

## 2020-12-05 ENCOUNTER — Other Ambulatory Visit: Payer: Self-pay | Admitting: Cardiovascular Disease

## 2020-12-07 ENCOUNTER — Ambulatory Visit (INDEPENDENT_AMBULATORY_CARE_PROVIDER_SITE_OTHER): Payer: Medicare Other

## 2020-12-07 DIAGNOSIS — Z95810 Presence of automatic (implantable) cardiac defibrillator: Secondary | ICD-10-CM

## 2020-12-07 DIAGNOSIS — I5032 Chronic diastolic (congestive) heart failure: Secondary | ICD-10-CM

## 2020-12-08 NOTE — Progress Notes (Signed)
Agree, thanks

## 2020-12-08 NOTE — Progress Notes (Signed)
EPIC Encounter for ICM Monitoring  Patient Name: Danielle Mckinney is a 70 y.o. female Date: 12/08/2020 Primary Care Physican: Maurice Small, MD Primary Cardiologist: Croitoru Electrophysiologist: Santina Evans Pacing: >99% 2/9/2022Weight:183lbs  AT/AF Burden:0% - takes Warfarin  Spoke with patient and reports feeling well at this time.  Denies fluid symptoms.    CorvueThoracic impedancesuggesting possible fluid accumulation starting 12/01/2020.  Prescribed:  Furosemide 40 mgTake 40 mg by mouth daily as needed for edema.Takes a few days a month.  Potassium 20 mEqTake 20 mEq by mouth daily as needed (when taking furosemide).  Spironolactone 25 mg take 1 tablet daily  Warfarin as directed  Recommendations:Advised to take 1 PRN Furosemide tablet tomorrow morning.    Follow-up plan: ICM clinic phone appointment on2/15/2022 to recheck fluid levels.91day device clinic remote transmission2/15/2022.   EP/Cardiology Office Visits: Recall for5/29/2022 with Dr Lovena Le.   Copy of ICM check sent to Dr.Taylor and Dr Sallyanne Kuster.   3 month ICM trend: 12/07/2020.    1 Year ICM trend:       [name], RN 12/08/2020 10:15 AM

## 2020-12-09 NOTE — Telephone Encounter (Signed)
Patient is following up. She states she is still having redness and irritation at the site of her incision.  No additional symptoms. Please call to discuss further.

## 2020-12-10 ENCOUNTER — Ambulatory Visit (INDEPENDENT_AMBULATORY_CARE_PROVIDER_SITE_OTHER): Payer: Medicare Other | Admitting: Emergency Medicine

## 2020-12-10 ENCOUNTER — Other Ambulatory Visit: Payer: Self-pay

## 2020-12-10 DIAGNOSIS — I428 Other cardiomyopathies: Secondary | ICD-10-CM

## 2020-12-10 NOTE — Progress Notes (Signed)
In for pacemaker site check due to pain at incision site x 1 week that increases when laying on her left side. Pacemaker site with discoloration at wound site, no drainage , no bleeding, wound edges approximated. Tenderness with palpation at medial aspect of incision site. Dr Curt Bears in to evaluate patient. Follow-up check scheduled for 12/16/20 when Dr Lovena Le is in clinic. Picture taken with patient permission. Patient to call the office for any edema, drainage, bleeding, temperature change of wound site or if she has a fever or chills.

## 2020-12-10 NOTE — Patient Instructions (Signed)
Follow -up pacemaker site check 12/16/20. Call the clinic if you develop any swelling, drainage , or pacemaker area becomes warm to touch. If you develop a fever or chills call the office. Call the main # if it is after 5 pm or the weekend. Device Clinic: [phone] Main office after hours: [phone]

## 2020-12-10 NOTE — Telephone Encounter (Signed)
Patient reports edema and "soreness" at pacemaker site. No drainage or bleeding from site. Reports no fever or chills. Will come in for device clinic for pacemaker site check @ 1530 today.

## 2020-12-14 ENCOUNTER — Ambulatory Visit (INDEPENDENT_AMBULATORY_CARE_PROVIDER_SITE_OTHER): Payer: Medicare Other

## 2020-12-14 DIAGNOSIS — I428 Other cardiomyopathies: Secondary | ICD-10-CM

## 2020-12-14 DIAGNOSIS — I5032 Chronic diastolic (congestive) heart failure: Secondary | ICD-10-CM | POA: Diagnosis not present

## 2020-12-14 DIAGNOSIS — Z95810 Presence of automatic (implantable) cardiac defibrillator: Secondary | ICD-10-CM

## 2020-12-14 LAB — CUP PACEART REMOTE DEVICE CHECK
Battery Remaining Longevity: 101 mo
Battery Remaining Percentage: 95.5 %
Battery Voltage: 3.01 V
Brady Statistic AP VP Percent: 6 %
Brady Statistic AP VS Percent: 1 %
Brady Statistic AS VP Percent: 94 %
Brady Statistic AS VS Percent: 1 %
Brady Statistic RA Percent Paced: 5.7 %
Date Time Interrogation Session: 20220215023843
Implantable Lead Implant Date: 20080307
Implantable Lead Implant Date: 20080307
Implantable Lead Implant Date: 20080307
Implantable Lead Location: 753858
Implantable Lead Location: 753859
Implantable Lead Location: 753860
Implantable Lead Model: 7001
Implantable Pulse Generator Implant Date: 20210216
Lead Channel Impedance Value: 350 Ohm
Lead Channel Impedance Value: 590 Ohm
Lead Channel Impedance Value: 830 Ohm
Lead Channel Pacing Threshold Amplitude: 0.5 V
Lead Channel Pacing Threshold Amplitude: 0.625 V
Lead Channel Pacing Threshold Amplitude: 0.75 V
Lead Channel Pacing Threshold Pulse Width: 0.5 ms
Lead Channel Pacing Threshold Pulse Width: 0.5 ms
Lead Channel Pacing Threshold Pulse Width: 0.8 ms
Lead Channel Sensing Intrinsic Amplitude: 12 mV
Lead Channel Sensing Intrinsic Amplitude: 5 mV
Lead Channel Setting Pacing Amplitude: 1.5 V
Lead Channel Setting Pacing Amplitude: 1.75 V
Lead Channel Setting Pacing Amplitude: 2 V
Lead Channel Setting Pacing Pulse Width: 0.5 ms
Lead Channel Setting Pacing Pulse Width: 0.8 ms
Lead Channel Setting Sensing Sensitivity: 2 mV
Pulse Gen Model: 3222
Pulse Gen Serial Number: 9105249

## 2020-12-16 ENCOUNTER — Ambulatory Visit (INDEPENDENT_AMBULATORY_CARE_PROVIDER_SITE_OTHER): Payer: Medicare Other | Admitting: Emergency Medicine

## 2020-12-16 ENCOUNTER — Other Ambulatory Visit: Payer: Self-pay

## 2020-12-16 DIAGNOSIS — I428 Other cardiomyopathies: Secondary | ICD-10-CM

## 2020-12-16 MED ORDER — CLINDAMYCIN HCL 300 MG PO CAPS: 300.0000 mg | ORAL_CAPSULE | Freq: Three times a day (TID) | ORAL | 0 refills | Status: AC

## 2020-12-16 NOTE — Progress Notes (Signed)
Patient seen in device clinic today with Dr. Lovena Le and myself. Patient denies any fever or chills. Sates he would like patient to start Cleocin 300 mg po TID x7 days. If diarrhea occurs, STOP the medication and call. Dr. Lovena Le wanted patient to follow up in 1 week but states she will be going out of town for 3 weeks for a family event. Dr. Lovena Le educated patient if she starts to have fever, chills or discharge from site she needs to go to the hospital for treatment. Patient verbalized understanding and will call if it worsens or does not heal on its own. Advised patient to call once she returns home so we can set up a wound check apt. Or to send a picture in. Patient aware.

## 2020-12-16 NOTE — Patient Instructions (Addendum)
Please call to schedule an appointment for a wound recheck when you return home or contact us to send a picture via email. If you have fever, chills or drainage from site you need to go to the closest hospital. If you have any dairrhea STOP the anti-biotic and call our clinic. Please call the device clinic with further questions or concerns.  [facility] [phone]

## 2020-12-20 ENCOUNTER — Other Ambulatory Visit: Payer: Self-pay

## 2020-12-20 ENCOUNTER — Ambulatory Visit (INDEPENDENT_AMBULATORY_CARE_PROVIDER_SITE_OTHER): Payer: Medicare Other

## 2020-12-20 ENCOUNTER — Telehealth: Payer: Self-pay

## 2020-12-20 DIAGNOSIS — I480 Paroxysmal atrial fibrillation: Secondary | ICD-10-CM

## 2020-12-20 DIAGNOSIS — Z7901 Long term (current) use of anticoagulants: Secondary | ICD-10-CM

## 2020-12-20 LAB — POCT INR: INR: 2.8 (ref 2.0–3.0)

## 2020-12-20 NOTE — Telephone Encounter (Signed)
Spoke with pt, she reports that the ICD implant site ahs not improved at all.  She started Clindamycin on 12/16/20 as prescribed, she stopped it on 2/20 due to diarrhea.  She reports the diarrhea is improving.    Noted that pt is not scheduled for f/u wound check, she is expecting to need to leave town soon for a family event.  Pt agreeable to coming in tomorrow

## 2020-12-20 NOTE — Telephone Encounter (Signed)
The pt started an antibiotic and on day 3 she started to have diarrhea. She would like for the nurse to call her back.

## 2020-12-20 NOTE — Telephone Encounter (Signed)
We will see her in the device clinic

## 2020-12-20 NOTE — Patient Instructions (Signed)
Continue taking 1.5 tablets daily except 1 tablet each Monday, Wednesday, and Friday.  Recheck INR in 6 weeks. Eat greens tonight. [phone]

## 2020-12-21 ENCOUNTER — Ambulatory Visit (INDEPENDENT_AMBULATORY_CARE_PROVIDER_SITE_OTHER): Payer: Self-pay | Admitting: Internal Medicine

## 2020-12-21 DIAGNOSIS — T827XXA Infection and inflammatory reaction due to other cardiac and vascular devices, implants and grafts, initial encounter: Secondary | ICD-10-CM

## 2020-12-21 MED ORDER — DOXYCYCLINE HYCLATE 50 MG PO CAPS: 100.0000 mg | ORAL_CAPSULE | Freq: Two times a day (BID) | ORAL | 0 refills | Status: AC

## 2020-12-21 MED ORDER — DOXYCYCLINE HYCLATE 100 MG PO TABS: 100.0000 mg | ORAL_TABLET | Freq: Two times a day (BID) | ORAL | Status: DC

## 2020-12-21 NOTE — Progress Notes (Signed)
EP Attending  I have examined Ms. Heyne today. 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 She appears to have cellulitis over the skin on her device. There is no drainage. The skin is hyperpigmented and warm, though not red. She denies symptoms of systemic infection with no fevers, chills or night sweats.  Her daughter is having a baby next week in [place] and she wants to be down there to help care for her new grandchild. I instructed her to seek medical attention if she develops any systemic symptoms. We have prescribed her doxycyline as she got diarrhea on clindamycin, and is allergic to both PCN and sulpha drugs. I instructed her that oral anti-biotics will not cure her infection though might suppress things long enough for her to be with her daughter.   Carleene Overlie Taylor,MD

## 2020-12-21 NOTE — Progress Notes (Signed)
EPIC Encounter for ICM Monitoring  Patient Name: Danielle Mckinney is a 70 y.o. female Date: 12/21/2020 Primary Care Physican: Maurice Small, MD Primary Cardiologist: Croitoru Electrophysiologist: Santina Evans Pacing: >99% 9/22/2021Weight:183lbs  AT/AF Burden:0% - takes Warfarin  Transmission reviewed.  Patient seen in office by Dr Lovena Le on 12/21/2020 and note states patient may have device extracted due to infection.  She will be started on antiboitics.  CorvueThoracic impedancesuggesting fluid levels returned to normal.  Prescribed:  Furosemide 40 mgTake 40 mg by mouth daily as needed for edema.Takes a few days a month.  Potassium 20 mEqTake 20 mEq by mouth daily as needed (when taking furosemide).  Spironolactone 25 mg take 1 tablet daily  Warfarin as directed  Recommendations: No changes.   Follow-up plan: ICM clinic phone appointment on3/21/2022.91day device clinic remote transmission5/17/2022.   EP/Cardiology Office Visits: 12/21/2020 with Dr Lovena Le. 02/01/2021 Device clinic wound check   Copy of ICM check sent to Dr.Taylor  3 month ICM trend: 12/14/2020.    1 Year ICM trend:       [name], RN 12/21/2020 3:41 PM

## 2020-12-21 NOTE — Progress Notes (Signed)
Remote pacemaker transmission.   

## 2020-12-21 NOTE — Patient Instructions (Addendum)
If you develop a fever , chills, or drainage from the wound site call the office immediately and if it is after office hours go to an emergency room to be evaluated. Take Doxycycline 100 mg twice a day for 10 days. Follow-up in device clinic in 01/31/21.

## 2020-12-21 NOTE — Progress Notes (Signed)
CRT-P device check and wound check in clinic. Normal device function. Thresholds, sensing, impedance consistent with previous measurements. Histograms appropriate for patient and level of activity. No mode switches or ventricular high rate episodes. Patient bi-ventricularly pacing 99% of the time. Device programmed with appropriate safety margins. Device heart failure diagnostics are within normal limits and stable over time. Estimated longevity 8 years 2 months. Patient enrolled in remote follow-up. Pacemaker site with discoloration and tenderness with palpation into neck region. Dr Lovena Le in to assess patient and orders given.Plan to check device remotely 03/15/21. Follow-up in device clinic 01/31/21 for recheck and start doxycycline 100 mg BID today for 10 days. Picture taken with patient permission.

## 2021-01-10 ENCOUNTER — Other Ambulatory Visit: Payer: Self-pay | Admitting: Cardiovascular Disease

## 2021-01-20 ENCOUNTER — Telehealth: Payer: Self-pay

## 2021-01-20 ENCOUNTER — Other Ambulatory Visit: Payer: Self-pay | Admitting: Cardiovascular Disease

## 2021-01-20 NOTE — Telephone Encounter (Signed)
The pt states she can not send missed ICM transmission because she is in [place]. She will send a transmission when she get home.

## 2021-01-28 NOTE — Progress Notes (Signed)
No ICM remote transmission received for 01/17/2021 and next ICM transmission scheduled for 02/14/2021.

## 2021-02-07 ENCOUNTER — Ambulatory Visit (INDEPENDENT_AMBULATORY_CARE_PROVIDER_SITE_OTHER): Payer: Medicare Other

## 2021-02-07 DIAGNOSIS — Z95810 Presence of automatic (implantable) cardiac defibrillator: Secondary | ICD-10-CM

## 2021-02-07 DIAGNOSIS — I5032 Chronic diastolic (congestive) heart failure: Secondary | ICD-10-CM | POA: Diagnosis not present

## 2021-02-08 ENCOUNTER — Telehealth: Payer: Self-pay

## 2021-02-08 NOTE — Progress Notes (Signed)
EPIC Encounter for ICM Monitoring  Patient Name: Danielle Mckinney is a 70 y.o. female Date: 02/08/2021 Primary Care Physican: Maurice Small, MD Primary Cardiologist: Croitoru Electrophysiologist: Santina Evans Pacing: >99% 9/22/2021Weight:183lbs  AT/AF Burden:0% - takes Warfarin  Attempted call to patient and unable to reach.  Left detailed message per DPR regarding transmission. Transmission reviewed.   CorvueThoracic impedancesuggesting normal fluid levels.  Prescribed:  Furosemide 40 mgTake 40 mg by mouth daily as needed for edema.Takes a few days a month.  Potassium 20 mEqTake 20 mEq by mouth daily as needed (when taking furosemide).  Spironolactone 25 mg take 1 tablet daily  Warfarin as directed  Recommendations:Left voice mail with ICM number and encouraged to call if experiencing any fluid symptoms.  Follow-up plan: ICM clinic phone appointment on6/10/2020.91day device clinic remote transmission5/17/2022.   EP/Cardiology Office Visits: Recall 09/20/2021 with Dr Sallyanne Kuster.  No Recall appointments for Dr Lovena Le.  Copy of ICM check sent to Dr.Taylor  3 month ICM trend: 02/07/2021.    1 Year ICM trend:       [name], RN 02/08/2021 8:56 AM

## 2021-02-08 NOTE — Telephone Encounter (Signed)
Remote ICM transmission received.  Attempted call to patient regarding ICM remote transmission and left detailed message per DPR.  Advised to return call for any fluid symptoms or questions. Next ICM remote transmission scheduled 03/23/2021.

## 2021-02-09 ENCOUNTER — Other Ambulatory Visit: Payer: Self-pay

## 2021-02-09 MED ORDER — POTASSIUM CHLORIDE CRYS ER 20 MEQ PO TBCR: EXTENDED_RELEASE_TABLET | ORAL | 3 refills | Status: DC

## 2021-02-10 ENCOUNTER — Other Ambulatory Visit: Payer: Self-pay

## 2021-02-10 ENCOUNTER — Ambulatory Visit (INDEPENDENT_AMBULATORY_CARE_PROVIDER_SITE_OTHER): Payer: Medicare Other | Admitting: Emergency Medicine

## 2021-02-10 DIAGNOSIS — I5032 Chronic diastolic (congestive) heart failure: Secondary | ICD-10-CM

## 2021-02-10 NOTE — Progress Notes (Signed)
Pacemaker site without edema or redness. Site was not hot to touch , no drainage, wound edges approximated. Dr Lovena Le in to evaluate pacemaker site and patient to notify office if she has any s/sx of infection to include fever, chills, or night sweats.

## 2021-02-10 NOTE — Patient Instructions (Signed)
Call if you develop swelling, drainage, night sweats, a fever or chills or if the wound site becomes hot to touch.   Device Clinic: [phone] [facility] Friday 8 am- 5 pm) After hours on-call: [phone]

## 2021-02-14 ENCOUNTER — Other Ambulatory Visit: Payer: Self-pay

## 2021-02-14 ENCOUNTER — Ambulatory Visit (INDEPENDENT_AMBULATORY_CARE_PROVIDER_SITE_OTHER): Payer: Medicare Other

## 2021-02-14 DIAGNOSIS — Z7901 Long term (current) use of anticoagulants: Secondary | ICD-10-CM | POA: Diagnosis not present

## 2021-02-14 DIAGNOSIS — I480 Paroxysmal atrial fibrillation: Secondary | ICD-10-CM

## 2021-02-14 LAB — POCT INR: INR: 2.5 (ref 2.0–3.0)

## 2021-02-14 NOTE — Patient Instructions (Signed)
Continue taking 1.5 tablets daily except 1 tablet each Monday, Wednesday, and Friday.  Recheck INR in 6 weeks.  [phone]

## 2021-02-15 ENCOUNTER — Other Ambulatory Visit: Payer: Self-pay

## 2021-02-15 MED ORDER — POTASSIUM CHLORIDE CRYS ER 20 MEQ PO TBCR: EXTENDED_RELEASE_TABLET | ORAL | 3 refills | Status: DC

## 2021-03-15 ENCOUNTER — Ambulatory Visit (INDEPENDENT_AMBULATORY_CARE_PROVIDER_SITE_OTHER): Payer: Medicare Other

## 2021-03-15 DIAGNOSIS — I5032 Chronic diastolic (congestive) heart failure: Secondary | ICD-10-CM

## 2021-03-15 DIAGNOSIS — I428 Other cardiomyopathies: Secondary | ICD-10-CM

## 2021-03-16 LAB — CUP PACEART REMOTE DEVICE CHECK
Battery Remaining Longevity: 97 mo
Battery Remaining Percentage: 95.5 %
Battery Voltage: 3.01 V
Brady Statistic AP VP Percent: 4.8 %
Brady Statistic AP VS Percent: 1 %
Brady Statistic AS VP Percent: 95 %
Brady Statistic AS VS Percent: 1 %
Brady Statistic RA Percent Paced: 4.5 %
Date Time Interrogation Session: 20220517021750
Implantable Lead Implant Date: 20080307
Implantable Lead Implant Date: 20080307
Implantable Lead Implant Date: 20080307
Implantable Lead Location: 753858
Implantable Lead Location: 753859
Implantable Lead Location: 753860
Implantable Lead Model: 7001
Implantable Pulse Generator Implant Date: 20210216
Lead Channel Impedance Value: 330 Ohm
Lead Channel Impedance Value: 530 Ohm
Lead Channel Impedance Value: 710 Ohm
Lead Channel Pacing Threshold Amplitude: 0.5 V
Lead Channel Pacing Threshold Amplitude: 0.625 V
Lead Channel Pacing Threshold Amplitude: 0.75 V
Lead Channel Pacing Threshold Pulse Width: 0.5 ms
Lead Channel Pacing Threshold Pulse Width: 0.5 ms
Lead Channel Pacing Threshold Pulse Width: 0.8 ms
Lead Channel Sensing Intrinsic Amplitude: 12 mV
Lead Channel Sensing Intrinsic Amplitude: 5 mV
Lead Channel Setting Pacing Amplitude: 1.5 V
Lead Channel Setting Pacing Amplitude: 1.75 V
Lead Channel Setting Pacing Amplitude: 2 V
Lead Channel Setting Pacing Pulse Width: 0.5 ms
Lead Channel Setting Pacing Pulse Width: 0.8 ms
Lead Channel Setting Sensing Sensitivity: 2 mV
Pulse Gen Model: 3222
Pulse Gen Serial Number: 9105249

## 2021-03-30 ENCOUNTER — Other Ambulatory Visit: Payer: Self-pay

## 2021-03-30 ENCOUNTER — Ambulatory Visit (INDEPENDENT_AMBULATORY_CARE_PROVIDER_SITE_OTHER): Payer: Medicare Other

## 2021-03-30 DIAGNOSIS — I480 Paroxysmal atrial fibrillation: Secondary | ICD-10-CM | POA: Diagnosis not present

## 2021-03-30 DIAGNOSIS — I5032 Chronic diastolic (congestive) heart failure: Secondary | ICD-10-CM

## 2021-03-30 DIAGNOSIS — Z7901 Long term (current) use of anticoagulants: Secondary | ICD-10-CM | POA: Diagnosis not present

## 2021-03-30 DIAGNOSIS — Z95810 Presence of automatic (implantable) cardiac defibrillator: Secondary | ICD-10-CM | POA: Diagnosis not present

## 2021-03-30 LAB — POCT INR: INR: 4 — AB (ref 2.0–3.0)

## 2021-03-30 NOTE — Patient Instructions (Signed)
Hold tonight and tomorrow night and then Continue taking 1.5 tablets daily except 1 tablet each Monday, Wednesday, and Friday.  Recheck INR in 4 weeks.  [phone]

## 2021-04-01 NOTE — Progress Notes (Signed)
EPIC Encounter for ICM Monitoring  Patient Name: Danielle Mckinney is a 70 y.o. female Date: 04/01/2021 Primary Care Physican: Maurice Small, MD Primary Cardiologist: Croitoru Electrophysiologist: Santina Evans Pacing: >99% 6/3/2022Weight:189-192lbs  AT/AF Burden:0% - takes Warfarin  Spoke with patient and reports feeling well at this time. Heart failure questions reviewed. Pt asymptomatic.  Exercising in the gym 3-4 times a week.   CorvueThoracic impedancesuggesting normal fluid levels.  Prescribed:  Furosemide 40 mgTake 40 mg by mouth daily as needed for edema.Takes a few days a month.  Potassium 20 mEqTake 20 mEq by mouth daily as needed (when taking furosemide).  Spironolactone 25 mg take 1 tablet daily  Warfarin as directed  Recommendations: No changes and encouraged to call if experiencing any fluid symptoms.  Follow-up plan: ICM clinic phone appointment on7/02/2021.91day device clinic remote transmission8/16/2022.   EP/Cardiology Office Visits: Recall 09/20/2021 with Dr Sallyanne Kuster.  No Recall appointments for Dr Lovena Le.  Copy of ICM check sent to Dr.Taylor  3 month ICM trend: 03/30/2021.    1 Year ICM trend:       [name], RN 04/01/2021 1:52 PM

## 2021-04-07 NOTE — Progress Notes (Signed)
Remote ICD transmission.   

## 2021-05-03 ENCOUNTER — Ambulatory Visit (INDEPENDENT_AMBULATORY_CARE_PROVIDER_SITE_OTHER): Payer: Medicare Other

## 2021-05-03 DIAGNOSIS — I5032 Chronic diastolic (congestive) heart failure: Secondary | ICD-10-CM

## 2021-05-03 DIAGNOSIS — Z95810 Presence of automatic (implantable) cardiac defibrillator: Secondary | ICD-10-CM | POA: Diagnosis not present

## 2021-05-04 ENCOUNTER — Telehealth: Payer: Self-pay

## 2021-05-04 NOTE — Progress Notes (Signed)
EPIC Encounter for ICM Monitoring  Patient Name: Danielle Mckinney is a 70 y.o. female Date: 05/04/2021 Primary Care Physican: Maurice Small, MD Primary Cardiologist: Croitoru Electrophysiologist: Santina Evans Pacing:  >99%   04/01/2021 Weight: 189-192 lbs   AT/AF Burden: 0% - takes Warfarin   Attempted call to patient and unable to reach.  Left detailed message per DPR regarding transmission. Transmission reviewed.    Corvue Thoracic impedance suggesting normal fluid levels.    Prescribed:  Furosemide 40 mg Take 40 mg by mouth daily as needed for edema.  Takes a few days a month.  Potassium 20 mEq Take 20 mEq by mouth daily as needed (when taking furosemide).  Spironolactone 25 mg take 1 tablet daily Warfarin as directed   Recommendations:  Left voice mail with ICM number and encouraged to call if experiencing any fluid symptoms.   Follow-up plan: ICM clinic phone appointment on 06/06/2021.   91 day device clinic remote transmission 06/14/2021.     EP/Cardiology Office Visits:  Recall 09/20/2021 with Dr Sallyanne Kuster.  No Recall appointments for Dr Lovena Le.   Copy of ICM check sent to Dr. Lovena Le   3 month ICM trend: 05/03/2021.    1 Year ICM trend:       [name], RN 05/04/2021 12:20 PM

## 2021-05-04 NOTE — Telephone Encounter (Signed)
Remote ICM transmission received.  Attempted call to patient regarding ICM remote transmission and left detailed message per DPR.  Advised to return call for any fluid symptoms or questions. Next ICM remote transmission scheduled 06/06/2021.

## 2021-05-05 ENCOUNTER — Ambulatory Visit (INDEPENDENT_AMBULATORY_CARE_PROVIDER_SITE_OTHER): Payer: Medicare Other

## 2021-05-05 ENCOUNTER — Other Ambulatory Visit: Payer: Self-pay

## 2021-05-05 DIAGNOSIS — Z7901 Long term (current) use of anticoagulants: Secondary | ICD-10-CM

## 2021-05-05 DIAGNOSIS — I480 Paroxysmal atrial fibrillation: Secondary | ICD-10-CM | POA: Diagnosis not present

## 2021-05-05 LAB — POCT INR: INR: 3.2 — AB (ref 2.0–3.0)

## 2021-05-05 NOTE — Patient Instructions (Signed)
Hold tonight only and then decrease to 1 tablet daily except 1.5 tablets each Sunday, Tuesday and Saturday.  Recheck INR in 3 weeks.  [phone]

## 2021-05-25 ENCOUNTER — Other Ambulatory Visit: Payer: Self-pay

## 2021-05-25 ENCOUNTER — Ambulatory Visit (INDEPENDENT_AMBULATORY_CARE_PROVIDER_SITE_OTHER): Payer: Medicare Other

## 2021-05-25 DIAGNOSIS — I480 Paroxysmal atrial fibrillation: Secondary | ICD-10-CM | POA: Diagnosis not present

## 2021-05-25 DIAGNOSIS — Z7901 Long term (current) use of anticoagulants: Secondary | ICD-10-CM | POA: Diagnosis not present

## 2021-05-25 LAB — POCT INR: INR: 2.6 (ref 2.0–3.0)

## 2021-05-25 NOTE — Patient Instructions (Signed)
Continue taking 1 tablet daily except 1.5 tablets each Sunday, Tuesday and Saturday.  Recheck INR in 3 weeks.  [phone]

## 2021-06-06 ENCOUNTER — Ambulatory Visit (INDEPENDENT_AMBULATORY_CARE_PROVIDER_SITE_OTHER): Payer: Medicare Other

## 2021-06-06 DIAGNOSIS — I5032 Chronic diastolic (congestive) heart failure: Secondary | ICD-10-CM

## 2021-06-06 DIAGNOSIS — Z95810 Presence of automatic (implantable) cardiac defibrillator: Secondary | ICD-10-CM | POA: Diagnosis not present

## 2021-06-08 ENCOUNTER — Telehealth: Payer: Self-pay

## 2021-06-08 NOTE — Telephone Encounter (Signed)
Remote ICM transmission received.  Attempted call to patient regarding ICM remote transmission and left detailed message per DPR.  Advised to return call for any fluid symptoms or questions. Next ICM remote transmission scheduled 07/11/2021.

## 2021-06-08 NOTE — Progress Notes (Signed)
EPIC Encounter for ICM Monitoring  Patient Name: Danielle Mckinney is a 70 y.o. female Date: 06/08/2021 Primary Care Physican: Maurice Small, MD Primary Cardiologist: Croitoru Electrophysiologist: Santina Evans Pacing:  >99%   04/01/2021 Weight: 189-192 lbs   AT/AF Burden: 0% - takes Warfarin   Attempted call to patient and unable to reach.  Left detailed message per DPR regarding transmission. Transmission reviewed.   Corvue Thoracic impedance normal but was suggesting possible fluid accumulation from 7/29-8/6.   Prescribed:  Furosemide 40 mg Take 40 mg by mouth daily as needed for edema.  Takes a few days a month.  Potassium 20 mEq Take 20 mEq by mouth daily as needed (when taking furosemide).  Spironolactone 25 mg take 1 tablet daily Warfarin as directed   Recommendations:  Left voice mail with ICM number and encouraged to call if experiencing any fluid symptoms.   Follow-up plan: ICM clinic phone appointment on 07/11/2021.   91 day device clinic remote transmission 06/14/2021.     EP/Cardiology Office Visits:  Recall 09/20/2021 with Dr Sallyanne Kuster.  No Recall appointments for Dr Lovena Le (last OV was 03/31/2020).   Copy of ICM check sent to Dr. Lovena Le.   3 month ICM trend: 06/06/2021.    1 Year ICM trend:       [name], RN 06/08/2021 3:47 PM

## 2021-06-14 ENCOUNTER — Ambulatory Visit (INDEPENDENT_AMBULATORY_CARE_PROVIDER_SITE_OTHER): Payer: Medicare Other

## 2021-06-14 DIAGNOSIS — I428 Other cardiomyopathies: Secondary | ICD-10-CM | POA: Diagnosis not present

## 2021-06-14 LAB — CUP PACEART REMOTE DEVICE CHECK
Battery Remaining Longevity: 76 mo
Battery Remaining Percentage: 78 %
Battery Voltage: 2.99 V
Brady Statistic AP VP Percent: 5.6 %
Brady Statistic AP VS Percent: 1 %
Brady Statistic AS VP Percent: 94 %
Brady Statistic AS VS Percent: 1 %
Brady Statistic RA Percent Paced: 5.3 %
Date Time Interrogation Session: 20220816020017
Implantable Lead Implant Date: 20080307
Implantable Lead Implant Date: 20080307
Implantable Lead Implant Date: 20080307
Implantable Lead Location: 753858
Implantable Lead Location: 753859
Implantable Lead Location: 753860
Implantable Lead Model: 7001
Implantable Pulse Generator Implant Date: 20210216
Lead Channel Impedance Value: 340 Ohm
Lead Channel Impedance Value: 540 Ohm
Lead Channel Impedance Value: 840 Ohm
Lead Channel Pacing Threshold Amplitude: 0.5 V
Lead Channel Pacing Threshold Amplitude: 0.625 V
Lead Channel Pacing Threshold Amplitude: 0.75 V
Lead Channel Pacing Threshold Pulse Width: 0.5 ms
Lead Channel Pacing Threshold Pulse Width: 0.5 ms
Lead Channel Pacing Threshold Pulse Width: 0.8 ms
Lead Channel Sensing Intrinsic Amplitude: 11.3 mV
Lead Channel Sensing Intrinsic Amplitude: 5 mV
Lead Channel Setting Pacing Amplitude: 1.5 V
Lead Channel Setting Pacing Amplitude: 1.75 V
Lead Channel Setting Pacing Amplitude: 2 V
Lead Channel Setting Pacing Pulse Width: 0.5 ms
Lead Channel Setting Pacing Pulse Width: 0.8 ms
Lead Channel Setting Sensing Sensitivity: 2 mV
Pulse Gen Model: 3222
Pulse Gen Serial Number: 9105249

## 2021-06-20 ENCOUNTER — Other Ambulatory Visit: Payer: Self-pay

## 2021-06-20 ENCOUNTER — Ambulatory Visit (INDEPENDENT_AMBULATORY_CARE_PROVIDER_SITE_OTHER): Payer: Medicare Other

## 2021-06-20 DIAGNOSIS — I480 Paroxysmal atrial fibrillation: Secondary | ICD-10-CM

## 2021-06-20 DIAGNOSIS — Z7901 Long term (current) use of anticoagulants: Secondary | ICD-10-CM

## 2021-06-20 DIAGNOSIS — I639 Cerebral infarction, unspecified: Secondary | ICD-10-CM

## 2021-06-20 DIAGNOSIS — I4891 Unspecified atrial fibrillation: Secondary | ICD-10-CM | POA: Diagnosis not present

## 2021-06-20 LAB — POCT INR: INR: 1.8 — AB (ref 2.0–3.0)

## 2021-06-20 NOTE — Patient Instructions (Signed)
Description   Stay consistent with greens and continue taking 1 tablet daily except 1.5 tablets each Sunday, Tuesday and Saturday.  Recheck INR in 4 weeks.  [phone]

## 2021-07-05 NOTE — Progress Notes (Signed)
Remote ICD transmission.   

## 2021-07-11 ENCOUNTER — Ambulatory Visit (INDEPENDENT_AMBULATORY_CARE_PROVIDER_SITE_OTHER): Payer: Medicare Other

## 2021-07-11 DIAGNOSIS — I5032 Chronic diastolic (congestive) heart failure: Secondary | ICD-10-CM

## 2021-07-11 DIAGNOSIS — Z950 Presence of cardiac pacemaker: Secondary | ICD-10-CM | POA: Diagnosis not present

## 2021-07-13 NOTE — Progress Notes (Signed)
EPIC Encounter for ICM Monitoring  Patient Name: Danielle Mckinney is a 70 y.o. female Date: 07/13/2021 Primary Care Physican: Maurice Small, MD Primary Cardiologist: Croitoru Electrophysiologist: Santina Evans Pacing:  >99%   04/01/2021 Weight: 189-192 lbs   AT/AF Burden: 0% - takes Warfarin   Transmission reviewed.   Corvue Thoracic impedance normal but was suggesting possible fluid accumulation from 8/27-9/1.   Prescribed:  Furosemide 40 mg Take 40 mg by mouth daily as needed for edema.  Takes a few days a month.  Potassium 20 mEq Take 20 mEq by mouth daily as needed (when taking furosemide).  Spironolactone 25 mg take 1 tablet daily Warfarin as directed   Recommendations:  No changes.    Follow-up plan: ICM clinic phone appointment on 08/15/2021.   91 day device clinic remote transmission 09/13/2021.     EP/Cardiology Office Visits:  Recall 09/20/2021 with Dr Sallyanne Kuster.  No Recall appointments for Dr Lovena Le (last OV was 03/31/2020).   Copy of ICM check sent to Dr. Lovena Le.    3 month ICM trend: 07/11/2021.    1 Year ICM trend:       [name], RN 07/13/2021 4:30 PM

## 2021-07-20 ENCOUNTER — Other Ambulatory Visit: Payer: Self-pay

## 2021-07-20 ENCOUNTER — Ambulatory Visit (INDEPENDENT_AMBULATORY_CARE_PROVIDER_SITE_OTHER): Payer: Medicare Other

## 2021-07-20 DIAGNOSIS — I480 Paroxysmal atrial fibrillation: Secondary | ICD-10-CM

## 2021-07-20 DIAGNOSIS — Z7901 Long term (current) use of anticoagulants: Secondary | ICD-10-CM | POA: Diagnosis not present

## 2021-07-20 LAB — POCT INR: INR: 2.7 (ref 2.0–3.0)

## 2021-07-20 NOTE — Patient Instructions (Signed)
Stay consistent with greens; Take 0.5 tablet tonight only and then continue taking 1 tablet daily except 1.5 tablets each Sunday, Tuesday and Saturday.  Recheck INR in 4 weeks.  [phone]

## 2021-07-22 ENCOUNTER — Other Ambulatory Visit: Payer: Self-pay | Admitting: Cardiovascular Disease

## 2021-07-26 ENCOUNTER — Other Ambulatory Visit: Payer: Self-pay | Admitting: Cardiovascular Disease

## 2021-07-26 DIAGNOSIS — I5032 Chronic diastolic (congestive) heart failure: Secondary | ICD-10-CM

## 2021-07-27 ENCOUNTER — Telehealth: Payer: Self-pay | Admitting: *Deleted

## 2021-07-27 NOTE — Telephone Encounter (Signed)
Patient has been made aware that her handicapped placard application is ready for pick up.

## 2021-08-15 ENCOUNTER — Ambulatory Visit (INDEPENDENT_AMBULATORY_CARE_PROVIDER_SITE_OTHER): Payer: Medicare Other

## 2021-08-15 DIAGNOSIS — I5032 Chronic diastolic (congestive) heart failure: Secondary | ICD-10-CM

## 2021-08-15 DIAGNOSIS — Z950 Presence of cardiac pacemaker: Secondary | ICD-10-CM

## 2021-08-17 NOTE — Progress Notes (Signed)
EPIC Encounter for ICM Monitoring  Patient Name: Danielle Mckinney is a 70 y.o. female Date: 08/17/2021 Primary Care Physican: Maurice Small, MD (Inactive) Primary Cardiologist: Croitoru Electrophysiologist: Santina Evans Pacing:  >99%   08/17/2021 Weight: 189 lbs   AT/AF Burden: 0% - takes Warfarin   Spoke with patient and heart failure questions reviewed.  Pt asymptomatic for fluid accumulation and feeling well.  She was in [place] during decreased impedance and forgot to take Furosemide with her.    Corvue Thoracic impedance normal but was suggesting possible fluid accumulation from 10/2-10/6.   Prescribed:  Furosemide 40 mg Take 40 mg by mouth daily as needed for edema.  Takes a few days a month.  Potassium 20 mEq Take 20 mEq by mouth daily as needed (when taking furosemide).  Spironolactone 25 mg take 1 tablet daily Warfarin as directed   Recommendations:  No changes and encouraged to call if experiencing any fluid symptoms.   Follow-up plan: ICM clinic phone appointment on 09/15/2021.   91 day device clinic remote transmission 09/13/2021.     EP/Cardiology Office Visits:  09/26/2021 with Dr Sallyanne Kuster.   Advised to call Dr Tanna Furry office for appointment (last OV was 03/31/2020).   Copy of ICM check sent to Dr. Lovena Le.     3 month ICM trend: 08/15/2021.    1 Year ICM trend:       [name], RN 08/17/2021 1:43 PM

## 2021-08-18 ENCOUNTER — Ambulatory Visit: Payer: Medicare Other

## 2021-08-18 ENCOUNTER — Other Ambulatory Visit: Payer: Self-pay

## 2021-08-18 DIAGNOSIS — I480 Paroxysmal atrial fibrillation: Secondary | ICD-10-CM | POA: Diagnosis not present

## 2021-08-18 DIAGNOSIS — Z7901 Long term (current) use of anticoagulants: Secondary | ICD-10-CM

## 2021-08-18 LAB — POCT INR: INR: 1.7 — AB (ref 2.0–3.0)

## 2021-08-18 NOTE — Patient Instructions (Signed)
Take 1.5 tablets tonight only and then continue taking 1 tablet daily except 1.5 tablets each Sunday, Tuesday and Saturday.  Recheck INR in 4 weeks.  [phone]

## 2021-09-06 ENCOUNTER — Telehealth: Payer: Self-pay | Admitting: Oncology

## 2021-09-06 ENCOUNTER — Inpatient Hospital Stay: Payer: Medicare Other

## 2021-09-06 ENCOUNTER — Inpatient Hospital Stay: Payer: Medicare Other | Admitting: Oncology

## 2021-09-06 NOTE — Telephone Encounter (Signed)
Sch per 11 inbasket, left msg

## 2021-09-13 ENCOUNTER — Ambulatory Visit (INDEPENDENT_AMBULATORY_CARE_PROVIDER_SITE_OTHER): Payer: Medicare Other

## 2021-09-13 DIAGNOSIS — I428 Other cardiomyopathies: Secondary | ICD-10-CM

## 2021-09-15 ENCOUNTER — Other Ambulatory Visit: Payer: Self-pay

## 2021-09-15 ENCOUNTER — Ambulatory Visit (INDEPENDENT_AMBULATORY_CARE_PROVIDER_SITE_OTHER): Payer: Medicare Other

## 2021-09-15 DIAGNOSIS — Z7901 Long term (current) use of anticoagulants: Secondary | ICD-10-CM | POA: Diagnosis not present

## 2021-09-15 DIAGNOSIS — I480 Paroxysmal atrial fibrillation: Secondary | ICD-10-CM

## 2021-09-15 LAB — POCT INR: INR: 1.6 — AB (ref 2.0–3.0)

## 2021-09-15 NOTE — Patient Instructions (Addendum)
Take 2 tablets tonight only and then continue taking 1 tablet daily except 1.5 tablets each Sunday, Tuesday and Saturday.  Recheck INR in 2 weeks.  [phone]

## 2021-09-16 NOTE — Progress Notes (Unsigned)
No ICM remote transmission received for 09/15/2021 and next ICM transmission scheduled for 10/10/2021.

## 2021-09-19 ENCOUNTER — Ambulatory Visit: Payer: Medicare Other | Admitting: Obstetrics & Gynecology

## 2021-09-19 LAB — CUP PACEART REMOTE DEVICE CHECK
Battery Remaining Longevity: 73 mo
Battery Remaining Percentage: 75 %
Battery Voltage: 2.99 V
Brady Statistic AP VP Percent: 6.1 %
Brady Statistic AP VS Percent: 1 %
Brady Statistic AS VP Percent: 94 %
Brady Statistic AS VS Percent: 1 %
Brady Statistic RA Percent Paced: 5.8 %
Date Time Interrogation Session: 20221115020021
Implantable Lead Implant Date: 20080307
Implantable Lead Implant Date: 20080307
Implantable Lead Implant Date: 20080307
Implantable Lead Location: 753858
Implantable Lead Location: 753859
Implantable Lead Location: 753860
Implantable Lead Model: 7001
Implantable Pulse Generator Implant Date: 20210216
Lead Channel Impedance Value: 340 Ohm
Lead Channel Impedance Value: 590 Ohm
Lead Channel Impedance Value: 830 Ohm
Lead Channel Pacing Threshold Amplitude: 0.5 V
Lead Channel Pacing Threshold Amplitude: 0.75 V
Lead Channel Pacing Threshold Amplitude: 0.75 V
Lead Channel Pacing Threshold Pulse Width: 0.5 ms
Lead Channel Pacing Threshold Pulse Width: 0.5 ms
Lead Channel Pacing Threshold Pulse Width: 0.8 ms
Lead Channel Sensing Intrinsic Amplitude: 12 mV
Lead Channel Sensing Intrinsic Amplitude: 5 mV
Lead Channel Setting Pacing Amplitude: 1.5 V
Lead Channel Setting Pacing Amplitude: 1.75 V
Lead Channel Setting Pacing Amplitude: 2 V
Lead Channel Setting Pacing Pulse Width: 0.5 ms
Lead Channel Setting Pacing Pulse Width: 0.8 ms
Lead Channel Setting Sensing Sensitivity: 2 mV
Pulse Gen Model: 3222
Pulse Gen Serial Number: 9105249

## 2021-09-21 NOTE — Progress Notes (Signed)
Remote ICD transmission.   

## 2021-09-26 ENCOUNTER — Ambulatory Visit: Payer: Medicare Other

## 2021-09-26 ENCOUNTER — Encounter: Payer: Self-pay | Admitting: Cardiovascular Disease

## 2021-09-26 ENCOUNTER — Other Ambulatory Visit: Payer: Self-pay

## 2021-09-26 ENCOUNTER — Ambulatory Visit (INDEPENDENT_AMBULATORY_CARE_PROVIDER_SITE_OTHER): Payer: Medicare Other | Admitting: Cardiovascular Disease

## 2021-09-26 VITALS — BP 114/58 | HR 65 | Ht 62.0 in | Wt 189.4 lb

## 2021-09-26 DIAGNOSIS — I5032 Chronic diastolic (congestive) heart failure: Secondary | ICD-10-CM

## 2021-09-26 DIAGNOSIS — Z950 Presence of cardiac pacemaker: Secondary | ICD-10-CM

## 2021-09-26 DIAGNOSIS — I480 Paroxysmal atrial fibrillation: Secondary | ICD-10-CM | POA: Diagnosis not present

## 2021-09-26 DIAGNOSIS — I10 Essential (primary) hypertension: Secondary | ICD-10-CM

## 2021-09-26 DIAGNOSIS — Z7901 Long term (current) use of anticoagulants: Secondary | ICD-10-CM | POA: Diagnosis not present

## 2021-09-26 LAB — POCT INR: INR: 1.3 — AB (ref 2.0–3.0)

## 2021-09-26 NOTE — Patient Instructions (Signed)

## 2021-09-26 NOTE — Progress Notes (Signed)
Cardiology Office Note    Date:  09/26/2021   ID:  Danielle Mckinney, DOB December 28, 1950, MRN [id number]  PCP:  Shirlean Mylar, MD (Inactive)  Cardiologist:  Lewayne Bunting, M.D.; Thurmon Fair, MD   Chief Complaint  Patient presents with   Congestive Heart Failure     History of Present Illness:  Danielle Mckinney is a 70 y.o. 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  She is doing quite well.  She has not had any episodes of heart failure exacerbation.  In fact she only takes her loop diuretic every 2 to 4 weeks or so.  She denies orthopnea, PND, edema, exertional dyspnea, chest pain, palpitations, dizziness lightheadedness or syncope.  Interrogation of her device shows normal function.  Presenting rhythm today is atrial sensed biventricular paced and she has more than 99% biventricular pacing efficiency.  Atrial pacing only occurs 6% of the time and heart rate histogram distribution is appropriate.  Estimated generator longevity is about 6 years.  She has not had any episodes of high ventricular rates or atrial fibrillation.  Her RV lead is a Riata ST 7001 implanted in 2008 that is still functioning normally has excellent high-voltage and pace-sense impedances, but which had some problems with noise oversensing so that ventricular sensing is programmed via the LV lead.    Her thoracic impedance does not show any evidence of fluid elevation for the last several  months, in fact over the last 2 or 3 months impedance has been steadily increasing suggesting she may be a little "dry".    Past Medical History:  Diagnosis Date   Atrial fibrillation [facility])    Automatic implantable cardioverter-defibrillator in situ 08/25/2009   Qualifier: Diagnosis of  By: Ladona Ridgel, MD, [facility], [place]con      Biventricular ICD (implantable cardioverter-defibrillator) in place    [facility]   Breast cancer [facility])    Cancer (HCC)    Breast cancer-BRACA I gene   CHF 08/18/2009   Qualifier: Diagnosis of  By: Oswald Hillock     CHF (congestive heart failure) (HCC)    Chronic diastolic CHF (congestive heart failure) (HCC) 04/23/2012   CIN I (cervical intraepithelial neoplasia I)    CVA (cerebral vascular accident) (HCC) 01/15/2013   Essential hypertension 08/18/2009   Qualifier: Diagnosis of  By: Oswald Hillock     Fibroid    Fibroid uterus 04/15/2013   Genetic testing 07/06/2015   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  The report date is January 21, 1998.    HTN (hypertension)    Long term current use of anticoagulant therapy 01/15/2013   Nonischemic cardiomyopathy (HCC)    related to anthracycline chemotherapy   Stroke [facility])     Past Surgical History:  Procedure Laterality Date   BIV ICD GENERTAOR CHANGE OUT  12/20/2011   [facility]   BIV ICD GENERTAOR CHANGE OUT N/A 12/20/2011   Procedure: BIV ICD GENERTAOR CHANGE OUT;  Surgeon: Marinus Maw, MD;  Location: [facility] CATH LAB;  Service: Cardiovascular;  Laterality: N/A;   BIV PACEMAKER GENERATOR CHANGEOUT N/A 12/16/2019   Procedure: DOWNGRADE TO BIV PACEMAKER;  Surgeon: Marinus Maw, MD;  Location: [facility] INVASIVE CV LAB;  Service: Cardiovascular;  Laterality: N/A;   BREAST SURGERY     Bilateral mastctomy and reconstruction TRAM   CARDIAC DEFIBRILLATOR PLACEMENT  CERVICAL CONE BIOPSY     COLPOSCOPY     GYNECOLOGIC CRYOSURGERY     MASTECTOMY     OOPHORECTOMY     BSO   TRANSTHORACIC ECHOCARDIOGRAM  08/14/06   TUBAL LIGATION     US ECHOCARDIOGRAPHY  06/25/2012   borderline concentric LVH,LV systolic fx mildly reduced,impaired LV relaxation    Current Medications: Outpatient Medications Prior to Visit  Medication Sig Dispense Refill   Calcium-Magnesium-Vitamin D (CALCIUM 1200+D3 PO) Take 1  tablet by mouth daily.      carvedilol (COREG) 6.25 MG tablet TAKE 1 AND 1/2 TABLETS BY MOUTH TWICE DAILY WITH FOOD 270 tablet 3   Cholecalciferol 25 MCG (1000 UT) tablet Take 1,000 Units by mouth daily.     furosemide (LASIX) 40 MG tablet TAKE 1 TABLET BY MOUTH EVERY DAY AS NEEDED FOR SWELLING 90 tablet 1   Krill Oil 500 MG CAPS Take 500 mg by mouth daily.     losartan (COZAAR) 100 MG tablet TAKE 1/2 TABLET(50 MG) BY MOUTH TWICE DAILY 90 tablet 3   Meclizine HCl 25 MG CHEW Chew by mouth as needed.     OVER THE COUNTER MEDICATION Take 1 tablet by mouth daily. Olive Leaf and Oregano     Polyethyl Glycol-Propyl Glycol (SYSTANE OP) Place 1 drop into both eyes 2 (two) times daily.     potassium chloride SA (KLOR-CON) 20 MEQ tablet TAKE 1 TABLET BY MOUTH DAILY IF NEEDED(TAKE ALONG WITH FUROSEMIDE WHEN NEEDED) 30 tablet 3   pyridOXINE (VITAMIN B-6) 100 MG tablet Take 100 mg by mouth daily.     spironolactone (ALDACTONE) 25 MG tablet TAKE 1 TABLET(25 MG) BY MOUTH DAILY 90 tablet 3   warfarin (COUMADIN) 4 MG tablet Take 1-2 tablets Daily or as prescribed by [facility] 45 tablet 4   triamcinolone (KENALOG) 0.025 % ointment Apply 1 application topically 2 (two) times daily. (Patient taking differently: Apply 1 application topically 2 (two) times daily as needed (eczema).) 30 g 0   valACYclovir (VALTREX) 500 MG tablet Take 500 mg by mouth as needed. (Patient not taking: Reported on 09/26/2021)     No facility-administered medications prior to visit.     Allergies:   Codeine, Lisinopril, Oxycodone, Penicillins, and Sulfa antibiotics   Social History   Socioeconomic History   Marital status: Divorced    Spouse name: Not on file   Number of children: Not on file   Years of education: Not on file   Highest education level: Not on file  Occupational History   Not on file  Tobacco Use   Smoking status: Never   Smokeless tobacco: Never  Vaping Use   Vaping Use: Never used  Substance and Sexual  Activity   Alcohol use: No    Alcohol/week: 0.0 standard drinks   Drug use: No   Sexual activity: Not Currently    Birth control/protection: Surgical    Comment: interecourse age 81, sexual partners more than 5  Other Topics Concern   Not on file  Social History Narrative   Not on file   Social Determinants of Health   Financial Resource Strain: Not on file  Food Insecurity: Not on file  Transportation Needs: Not on file  Physical Activity: Not on file  Stress: Not on file  Social Connections: Not on file     Family History:  The patient's family history includes Breast cancer in her sister; Cirrhosis in her father; Clotting disorder in her mother; Coronary artery disease in an  other family member; Heart disease in her maternal grandfather; Hypertension in her brother and sister.   ROS:   Please see the history of present illness.   All other systems are reviewed and are negative.   PHYSICAL EXAM:   VS:  BP (!) 114/58 (BP Location: Left Arm, Patient Position: Sitting, Cuff Size: Large)   Pulse 65   Ht $R'5\' 2"'RZ$  (1.575 m)   Wt 189 lb 6.4 oz (85.9 kg)   SpO2 96%   BMI 34.64 kg/m       General: Alert, oriented x3, no distress, healthy subclavian PM site Head: no evidence of trauma, PERRL, EOMI, no exophtalmos or lid lag, no myxedema, no xanthelasma; normal ears, nose and oropharynx Neck: normal jugular venous pulsations and no hepatojugular reflux; brisk carotid pulses without delay and no carotid bruits Chest: clear to auscultation, no signs of consolidation by percussion or palpation, normal fremitus, symmetrical and full respiratory excursions Cardiovascular: normal position and quality of the apical impulse, regular rhythm, normal first and second heart sounds, no murmurs, rubs or gallops Abdomen: no tenderness or distention, no masses by palpation, no abnormal pulsatility or arterial bruits, normal bowel sounds, no hepatosplenomegaly Extremities: no clubbing, cyanosis or  edema; 2+ radial, ulnar and brachial pulses bilaterally; 2+ right femoral, posterior tibial and dorsalis pedis pulses; 2+ left femoral, posterior tibial and dorsalis pedis pulses; no subclavian or femoral bruits Neurological: grossly nonfocal Psych: Normal mood and affect'   Wt Readings from Last 3 Encounters:  09/26/21 189 lb 6.4 oz (85.9 kg)  10/11/20 183 lb 8 oz (83.2 kg)  09/20/20 187 lb 9.6 oz (85.1 kg)      Studies/Labs Reviewed:   ECHO 12/09/2019: Study Result   1. Left ventricular ejection fraction, by estimation, is 55 to 60%. The left ventricle has normal function. The left ventrical has no regional wall motion abnormalities. Left ventricular diastolic parameters are  consistent with Grade I diastolic dysfunction (impaired relaxation). Elevated left ventricular pressure.   2. Right ventricular systolic function is normal. The right ventricular size is normal. There is mildly elevated pulmonary artery systolic pressure.   3. Left atrial size was mildly dilated.   4. Trivial mitral valve regurgitation.   5. The aortic valve is normal in structure and function. Aortic valve regurgitation is not visualized. No aortic stenosis is present.   6. The inferior vena cava is normal in size with greater than 50% respiratory variability, suggesting right atrial pressure of 3 mmHg.    EKG:  EKG is ordered today.  Personally reviewed shows atrial sensed, biventricular paced rhythm with a QRS of 134 ms and a distinct positive initial R wave in lead V1.  QTc 468 ms.  Recent Labs: 10/11/2020: ALT 17; BUN 20; Creatinine, Ser 0.96; Hemoglobin 12.3; Platelets 227; Potassium 4.1; Sodium 138    ASSESSMENT:    1. Chronic diastolic heart failure ([place])   2. Biventricular cardiac pacemaker in situ   3. Paroxysmal atrial fibrillation (HCC)   4. Long term current use of anticoagulant therapy   5. Essential hypertension   6. Morbid obesity ([place])      PLAN:  In order of problems listed  above:  CHF: NYHA class I, euvolemic with rare use of loop diuretics, on carvedilol/ARB/spironolactone.  Excellent recovery in LV systolic function. CRT-P: s/p generator change out, downgrade from ICD. Some chronic noise oversensing on the RV channel, now programmed to sense via LV lead. AFib: Although she has a remote history of embolic stroke, its  been over 3 years since we last saw atrial fibrillation on her device checks.  None has been seen on the current generator since change out last year.  CHADSVasc 5 (gender, hypertension, heart failure, previous stroke).   Warfarin: No bleeding problems.  Recently has had difficulty keeping INR in therapeutic range and actually INR was quite low at 1.3 today.  Thankfully no recent atrial fibrillation. HTN: Consistently well controlled. Obesity: Remains moderately obese.   Medication Adjustments/Labs and Tests Ordered: Current medicines are reviewed at length with the patient today.  Concerns regarding medicines are outlined above.  Medication changes, Labs and Tests ordered today are listed in the Patient Instructions below. Patient Instructions  Medication Instructions:  No changes *If you need a refill on your cardiac medications before your next appointment, please call your pharmacy*   Lab Work: None ordered If you have labs (blood work) drawn today and your tests are completely normal, you will receive your results only by: [place] (if you have MyChart) OR A paper copy in the mail If you have any lab test that is abnormal or we need to change your treatment, we will call you to review the results.   Testing/Procedures: None ordered   Follow-Up: At [facility], you and your health needs are our priority.  As part of our continuing mission to provide you with exceptional heart care, we have created designated Provider Care Teams.  These Care Teams include your primary Cardiologist (physician) and Advanced Practice Providers  (APPs -  Physician Assistants and Nurse Practitioners) who all work together to provide you with the care you need, when you need it.  We recommend signing up for the patient portal called "MyChart".  Sign up information is provided on this After Visit Summary.  MyChart is used to connect with patients for Virtual Visits (Telemedicine).  Patients are able to view lab/test results, encounter notes, upcoming appointments, etc.  Non-urgent messages can be sent to your provider as well.   To learn more about what you can do with MyChart, go to [email or web address].    Your next appointment:   12 month(s)  The format for your next appointment:   In Person  Provider:   Sanda Klein, MD      Signed, Sanda Klein, MD  09/26/2021 3:52 PM    [facility] [address], [place], [place]  [address] Phone: [phone]; Fax: [phone]

## 2021-09-26 NOTE — Patient Instructions (Signed)
Take 2 tablets tonight only and then increase to 1.5 tablets daily except 1 tablet each Monday, Wednesday and Friday.  Recheck INR in 2 weeks.  [phone]

## 2021-10-05 NOTE — Progress Notes (Signed)
[place]  [phone] [phone]     ID: Danielle Mckinney DOB: 08/27/1951  MR#: [id number]  ESP#:[id number]  Patient Care Team: Maurice Small, MD (Inactive) as PCP - General (Family Medicine) Croitoru, Dani Gobble, MD as PCP - Cardiology (Cardiology) Hensley Aziz, Virgie Dad, MD as Consulting Physician (Oncology) Croitoru, Dani Gobble, MD as Consulting Physician (Cardiology) Princess Bruins, MD as Consulting Physician (Obstetrics and Gynecology) Iran Planas, MD as Consulting Physician (Orthopedic Surgery) OTHER MD:  CHIEF COMPLAINT:  BRCA positive breast cancer (s/p bilateral mastectomies)  CURRENT TREATMENT:  Observation   INTERVAL HISTORY: Danielle Mckinney returns today for follow up of her remote BRCA related breast cancer.  She continues under observation.  Since her last visit, she underwent bone density screening on 10/26/2020 showing a T-score of +0.2.  Her most recent echo 12/09/2019 showed an ejection fraction in the 55-60% range.  REVIEW OF SYSTEMS: Danielle Mckinney just had her fourth grandchild, from her younger daughter who lives in [place].  Danielle Mckinney goes to the gym at least 5 days a week.  She enjoys going out to lunch with friends as well.  A detailed review of systems is otherwise stable.   COVID 19 VACCINATION STATUS: Status post vaccines x5 as of December 2022  BREAST CANCER HISTORY: From the earlier summary note:   Danielle Mckinney is referred back by her primary care physician, Dr. Justin Mend, because of a rise in her breast cancer tumor marker. To recap : Danielle Mckinney is status post bilateral breast cancers, not synchronous, and bilateral mastectomies with reconstruction. She had stage IV disease , and was treated aggressively with high-dose chemotherapy and I'll ago his stem cell transplant support at [facility] in 1998. She has remained disease-free since that time , but did have a significant cardiomyopathy, which now appears to have largely resolved. She also had a right body stroke ,  with minimal residuals.   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 One of her 3 sisters did die from breast cancer at an early age and one of the patient's 2 daughters has been found to carry the mutation.  Her breast cancer history is given in more detail below    PAST MEDICAL HISTORY: Past Medical History:  Diagnosis Date   Atrial fibrillation ([place])    Automatic implantable cardioverter-defibrillator in situ 08/25/2009   Qualifier: Diagnosis of  By: Lovena Le, MD, [facility], Binnie Kand     Biventricular ICD (implantable cardioverter-defibrillator) in place    St.Jude   Breast cancer [facility])    Cancer ([place])    Breast cancer-BRACA I gene   CHF 08/18/2009   Qualifier: Diagnosis of  By: Lynden Ang     CHF (congestive heart failure) ([place])    Chronic diastolic CHF (congestive heart failure) ([place]) 04/23/2012   CIN I (cervical intraepithelial neoplasia I)    CVA (cerebral vascular accident) ([place]) 01/15/2013   Essential hypertension 08/18/2009   Qualifier: Diagnosis of  By: Lynden Ang     Fibroid    Fibroid uterus 04/15/2013   Genetic testing 07/06/2015   BRCA1 c.191G>A (C64Y) pathogenic mutation found at [facility].  The report date is January 21, 1998.    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   Stroke [facility])     PAST SURGICAL HISTORY:  Past Surgical History:  Procedure Laterality Date   BIV ICD GENERTAOR CHANGE OUT  12/20/2011   St.[employer]ude   BIV ICD GENERTAOR CHANGE OUT N/A 12/20/2011   Procedure: BIV ICD GENERTAOR CHANGE OUT;  Surgeon: Evans Lance, MD;  Location: [facility] CATH LAB;  Service: Cardiovascular;  Laterality: N/A;   BIV PACEMAKER GENERATOR CHANGEOUT N/A 12/16/2019   Procedure: DOWNGRADE TO BIV PACEMAKER;  Surgeon: Evans Lance, MD;   Location: [place] CV LAB;  Service: Cardiovascular;  Laterality: N/A;   BREAST SURGERY     Bilateral mastctomy and reconstruction TRAM   CARDIAC DEFIBRILLATOR PLACEMENT     CERVICAL [place] BIOPSY     COLPOSCOPY     GYNECOLOGIC CRYOSURGERY     MASTECTOMY     OOPHORECTOMY     BSO   TRANSTHORACIC ECHOCARDIOGRAM  08/14/06   TUBAL LIGATION     US ECHOCARDIOGRAPHY  06/25/2012   borderline concentric LVH,LV systolic fx mildly reduced,impaired LV relaxation    FAMILY HISTORY Family History  Problem Relation Age of Onset   Breast cancer Sister        Age 80   Hypertension Sister    Heart disease Maternal Grandfather    Clotting disorder Mother    Cirrhosis Father    Coronary artery disease Other    Hypertension Brother   The patient's father died in his late 79s from cirrhosis of the liver. The patient's mother died at the age of 45 secondary to a blood clot. Danielle Mckinney had 3 brothers, 3 sisters. One sister died from breast cancer. But Danielle Mckinney does not recall the exact age of diagnosis. One paternal aunt died with breast and ovarian cancer. The patient is known to be BRCA positive as is one of her 2 daughters   GYNECOLOGIC HISTORY:  No LMP recorded. Patient is postmenopausal.  menarche age 41, first live birth age 8 as she is [place] P2. She had bilateral salpingo-oophorectomy   SOCIAL HISTORY:  Danielle Mckinney is divorced and lives alone, with no pets. Her daughter Danielle Mckinney moved back to town September 2018; younger daughter Danielle Mckinney works as an Therapist, sports in [place] and proved to be BRCA positive. The patient has 4 grandchildren. One granddaughter is an Therapist, sports in Engineer, maintenance (IT) at [employer].    ADVANCED DIRECTIVES:  In place; her daughter Danielle Mckinney is her healthcare power of attorney   HEALTH MAINTENANCE: Social History   Tobacco Use   Smoking status: Never   Smokeless tobacco: Never  Vaping Use   Vaping Use: Never used  Substance Use Topics   Alcohol use: No    Alcohol/week: 0.0 standard drinks   Drug use: No      Colonoscopy: 11/2018 (virtual, Dr. Penelope Coop)  PAP: July 2016  Bone density: 05/27/2015/normal  Allergies  Allergen Reactions   Codeine Nausea And Vomiting   Lisinopril Cough   Oxycodone Nausea And Vomiting   Penicillins Nausea And Vomiting    Did it involve swelling of the face/tongue/throat, SOB, or low BP? No Did it involve sudden or severe rash/hives, skin peeling, or any reaction on the inside of your mouth or nose? No Did you need to seek medical attention at a hospital or doctor's office? No When did it last happen?       If all above answers are "NO", may proceed with cephalosporin use.    Sulfa Antibiotics Nausea And Vomiting    No outpatient medications have been marked as taking for the 10/06/21 encounter (Office Visit) with Magrinat, Virgie Dad, MD.  Current Outpatient Medications  Medication Sig Dispense Refill   Calcium-Magnesium-Vitamin D (CALCIUM 1200+D3 PO) Take 1 tablet by mouth daily.      carvedilol (COREG) 6.25 MG tablet TAKE 1 AND 1/2 TABLETS BY MOUTH TWICE DAILY WITH FOOD 270 tablet 3   Cholecalciferol 25 MCG (1000 UT) tablet Take 1,000 Units by mouth daily.     furosemide (LASIX) 40 MG tablet TAKE 1 TABLET BY MOUTH EVERY DAY AS NEEDED FOR SWELLING 90 tablet 1   Krill Oil 500 MG CAPS Take 500 mg by mouth daily.     losartan (COZAAR) 100 MG tablet TAKE 1/2 TABLET(50 MG) BY MOUTH TWICE DAILY 90 tablet 3   Meclizine HCl 25 MG CHEW Chew by mouth as needed.     OVER THE COUNTER MEDICATION Take 1 tablet by mouth daily. Olive Leaf and Oregano     Polyethyl Glycol-Propyl Glycol (SYSTANE OP) Place 1 drop into both eyes 2 (two) times daily.     potassium chloride SA (KLOR-CON) 20 MEQ tablet TAKE 1 TABLET BY MOUTH DAILY IF NEEDED(TAKE ALONG WITH FUROSEMIDE WHEN NEEDED) 30 tablet 3   pyridOXINE (VITAMIN B-6) 100 MG tablet Take 100 mg by mouth daily.     spironolactone (ALDACTONE) 25 MG tablet TAKE 1 TABLET(25 MG) BY MOUTH DAILY 90 tablet 3   warfarin (COUMADIN) 4 MG  tablet Take 1-2 tablets Daily or as prescribed by [facility] 45 tablet 4   No current facility-administered medications for this visit.    OBJECTIVE: African-American woman in no acute distress  Vitals:   10/06/21 1446  BP: 134/60  Pulse: 87  Resp: 16  Temp: 97.7 F (36.5 C)  SpO2: 97%    Wt Readings from Last 3 Encounters:  10/06/21 187 lb 1.6 oz (84.9 kg)  09/26/21 189 lb 6.4 oz (85.9 kg)  10/11/20 183 lb 8 oz (83.2 kg)   Body mass index is 34.22 kg/m.    ECOG FS:1 - Symptomatic but completely ambulatory  Sclerae unicteric, EOMs intact Wearing a mask No cervical or supraclavicular adenopathy Lungs no rales or rhonchi Heart regular rate and rhythm Abd soft, nontender, positive bowel sounds MSK no focal spinal tenderness, no upper extremity lymphedema Neuro: nonfocal, well oriented, appropriate affect Breasts: Status post bilateral mastectomies with bilateral implant in place.  There is no evidence of local recurrence.  Both axillae are benign.   LAB RESULTS:  CMP     Component Value Date/Time   NA 138 10/11/2020 1326   NA 141 12/08/2019 1447   NA 139 07/12/2017 1445   K 4.1 10/11/2020 1326   K 4.2 07/12/2017 1445   CL 104 10/11/2020 1326   CO2 26 10/11/2020 1326   CO2 26 07/12/2017 1445   GLUCOSE 103 (H) 10/11/2020 1326   GLUCOSE 103 07/12/2017 1445   BUN 20 10/11/2020 1326   BUN 16 12/08/2019 1447   BUN 12.7 07/12/2017 1445   CREATININE 0.96 10/11/2020 1326   CREATININE 0.8 07/12/2017 1445   CALCIUM 10.3 10/11/2020 1326   CALCIUM 9.9 07/12/2017 1445   PROT 7.9 10/11/2020 1326   PROT 7.3 07/12/2017 1445   ALBUMIN 4.1 10/11/2020 1326   ALBUMIN 3.9 07/12/2017 1445   AST 26 10/11/2020 1326   AST 19 07/12/2017 1445   ALT 17 10/11/2020 1326   ALT 8 07/12/2017 1445   ALKPHOS 71 10/11/2020 1326   ALKPHOS 61 07/12/2017 1445   BILITOT 0.8 10/11/2020 1326   BILITOT 0.54 07/12/2017 1445   GFRNONAA >60 10/11/2020 1326  GFRAA 92 12/08/2019 1447     CBC    Component Value Date/Time   WBC 7.4 10/06/2021 1434   RBC 4.54 10/06/2021 1434   HGB 12.2 10/06/2021 1434   HGB 12.4 12/08/2019 1447   HGB 12.2 07/12/2017 1445   HCT 36.1 10/06/2021 1434   HCT 37.2 12/08/2019 1447   HCT 36.8 07/12/2017 1445   PLT 221 10/06/2021 1434   PLT 230 07/17/2017 1549   MCV 79.5 (L) 10/06/2021 1434   MCV 79 12/08/2019 1447   MCV 79.4 (L) 07/12/2017 1445   MCH 26.9 10/06/2021 1434   MCHC 33.8 10/06/2021 1434   RDW 15.0 10/06/2021 1434   RDW 15.3 12/08/2019 1447   RDW 15.9 (H) 07/12/2017 1445   LYMPHSABS 2.7 10/06/2021 1434   LYMPHSABS 2.4 12/08/2019 1447   LYMPHSABS 2.2 07/12/2017 1445   MONOABS 1.0 10/06/2021 1434   MONOABS 0.7 07/12/2017 1445   EOSABS 0.3 10/06/2021 1434   EOSABS 0.3 12/08/2019 1447   BASOSABS 0.0 10/06/2021 1434   BASOSABS 0.0 12/08/2019 1447   BASOSABS 0.0 07/12/2017 1445       Chemistry    CMP Latest Ref Rng & Units 10/11/2020 12/08/2019 08/05/2019  Glucose 70 - 99 mg/dL 103(H) 84 108(H)  BUN 8 - 23 mg/dL _0 Creatinine 0.44 - 1.00 mg/dL 0.96 0.78 1.04(H)  Sodium 135 - 145 mmol/L 138 141 139  Potassium 3.5 - 5.1 mmol/L 4.1 4.3 4.0  Chloride 98 - 111 mmol/L 104 103 105  CO2 22 - 32 mmol/L _1 Calcium 8.9 - 10.3 mg/dL 10.3 10.2 9.8  Total Protein 6.5 - 8.1 g/dL 7.9 - 7.6  Total Bilirubin 0.3 - 1.2 mg/dL 0.8 - 0.6  Alkaline Phos 38 - 126 U/L 71 - 62  AST 15 - 41 U/L 26 - 23  ALT 0 - 44 U/L 17 - 13     Urinalysis    Component Value Date/Time   COLORURINE YELLOW 08/08/2008 1859   APPEARANCEUR CLEAR 08/08/2008 1859   LABSPEC 1.012 08/08/2008 1859   PHURINE 6.0 08/08/2008 1859   GLUCOSEU NEGATIVE 08/08/2008 1859   HGBUR NEGATIVE 08/08/2008 1859   BILIRUBINUR NEGATIVE 08/08/2008 1859   KETONESUR NEGATIVE 08/08/2008 1859   PROTEINUR NEGATIVE 08/08/2008 1859   UROBILINOGEN 0.2 08/08/2008 1859   NITRITE NEGATIVE 08/08/2008 1859   LEUKOCYTESUR  08/08/2008 1859    NEGATIVE MICROSCOPIC NOT DONE ON  URINES WITH NEGATIVE PROTEIN, BLOOD, LEUKOCYTES, NITRITE, OR GLUCOSE <1000 mg/dL.    STUDIES: CUP PACEART REMOTE DEVICE CHECK  Result Date: 09/19/2021 Scheduled remote reviewed. Normal device function.  Next remote 91 days.    ASSESSMENT: 70 y.o. BRCA positive [place] woman  (1) s/p Right lumpectomy in 1990 for a Stage II breast cancer treated with doxorubicin, cyclophosphamide and 5- fluorouracil, followed by radiation.  (2) Left lumpectomy in April of 1998 for a "Stage IV" tumor (she had left supraclavicular involvement which currently would be staged as III-C),  Estrogen and progesterone receptor negative, HER2 unknown  (a) treated with doxorubicin and paclitaxel followed by high-dose chemotherapy at [facility] with stem cell rescue  in December of 1998   (b) received radiation to the left supraclavicular, left axillary and left chest wall areas, completed March of 1999   (3) BRCA positivity:  (a) s/p bilateral mastectomies with flap reconstruction January 2000 with no evidence of residual disease.  (b) status post remote bilateral salpingo-oophorectomy  (4) cardiomyopathy likely related to her prior chemotherapy  (a)  echo  04/14/2014 shows an ejection fraction in the 60-65% range  (b)  Grade 1 diastolic dysfunction  (c) echo February 2021 shows an ejection fraction in the 55-60% range.  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  (a)  on lifelong anticoagulation  (6) alpha thalassemia trait, with an MCV in the high 70s chronically   PLAN: Tashala is now a little over 24 years out from definitive surgery for her breast cancer with no evidence of disease recurrence.  This is very favorable.  I commended her excellent exercise program and congratulated her on her fourth grandchild.  I offered her continued follow-up here but since I am retiring she would prefer to "graduate".  Accordingly we are not making any further routine  appointments for her here.  In terms of breast cancer follow-up all she needs is a yearly breast/chest wall exam  Total encounter time 20 minutes.   Wilburta Milbourn, Virgie Dad, MD  10/06/21 3:03 PM Medical Oncology and Hematology [facility] [place], [place] [address] Tel. [phone]    Fax. [phone]   I, Wilburn Mylar, am acting as scribe for Dr. Virgie Dad. Rebeccah Ivins.  I, Lurline Del MD, have reviewed the above documentation for accuracy and completeness, and I agree with the above.   *Total Encounter Time as defined by the Centers for Medicare and Medicaid Services includes, in addition to the face-to-face time of a patient visit (documented in the note above) non-face-to-face time: obtaining and reviewing outside history, ordering and reviewing medications, tests or procedures, care coordination (communications with other health care professionals or caregivers) and documentation in the medical record.

## 2021-10-06 ENCOUNTER — Inpatient Hospital Stay (HOSPITAL_BASED_OUTPATIENT_CLINIC_OR_DEPARTMENT_OTHER): Payer: Medicare Other | Admitting: Oncology

## 2021-10-06 ENCOUNTER — Inpatient Hospital Stay: Payer: Medicare Other | Attending: Oncology

## 2021-10-06 ENCOUNTER — Other Ambulatory Visit: Payer: Self-pay

## 2021-10-06 VITALS — BP 134/60 | HR 87 | Temp 97.7°F | Resp 16 | Ht 62.0 in | Wt 187.1 lb

## 2021-10-06 DIAGNOSIS — C50911 Malignant neoplasm of unspecified site of right female breast: Secondary | ICD-10-CM

## 2021-10-06 DIAGNOSIS — Z171 Estrogen receptor negative status [ER-]: Secondary | ICD-10-CM

## 2021-10-06 DIAGNOSIS — Z8041 Family history of malignant neoplasm of ovary: Secondary | ICD-10-CM | POA: Diagnosis not present

## 2021-10-06 DIAGNOSIS — Z9013 Acquired absence of bilateral breasts and nipples: Secondary | ICD-10-CM | POA: Insufficient documentation

## 2021-10-06 DIAGNOSIS — Z803 Family history of malignant neoplasm of breast: Secondary | ICD-10-CM | POA: Diagnosis not present

## 2021-10-06 DIAGNOSIS — Z7901 Long term (current) use of anticoagulants: Secondary | ICD-10-CM

## 2021-10-06 DIAGNOSIS — Z853 Personal history of malignant neoplasm of breast: Secondary | ICD-10-CM | POA: Diagnosis not present

## 2021-10-06 DIAGNOSIS — Z8673 Personal history of transient ischemic attack (TIA), and cerebral infarction without residual deficits: Secondary | ICD-10-CM | POA: Insufficient documentation

## 2021-10-06 DIAGNOSIS — Z1501 Genetic susceptibility to malignant neoplasm of breast: Secondary | ICD-10-CM | POA: Diagnosis not present

## 2021-10-06 DIAGNOSIS — Z923 Personal history of irradiation: Secondary | ICD-10-CM | POA: Diagnosis not present

## 2021-10-06 DIAGNOSIS — I110 Hypertensive heart disease with heart failure: Secondary | ICD-10-CM | POA: Diagnosis not present

## 2021-10-06 DIAGNOSIS — I428 Other cardiomyopathies: Secondary | ICD-10-CM

## 2021-10-06 DIAGNOSIS — C50511 Malignant neoplasm of lower-outer quadrant of right female breast: Secondary | ICD-10-CM

## 2021-10-06 DIAGNOSIS — D563 Thalassemia minor: Secondary | ICD-10-CM | POA: Diagnosis not present

## 2021-10-06 DIAGNOSIS — I5032 Chronic diastolic (congestive) heart failure: Secondary | ICD-10-CM | POA: Diagnosis not present

## 2021-10-06 DIAGNOSIS — C50912 Malignant neoplasm of unspecified site of left female breast: Secondary | ICD-10-CM | POA: Diagnosis not present

## 2021-10-06 DIAGNOSIS — Z79899 Other long term (current) drug therapy: Secondary | ICD-10-CM | POA: Insufficient documentation

## 2021-10-06 DIAGNOSIS — I429 Cardiomyopathy, unspecified: Secondary | ICD-10-CM | POA: Insufficient documentation

## 2021-10-06 LAB — COMPREHENSIVE METABOLIC PANEL
ALT: 15 U/L (ref 0–44)
AST: 22 U/L (ref 15–41)
Albumin: 3.9 g/dL (ref 3.5–5.0)
Alkaline Phosphatase: 67 U/L (ref 38–126)
Anion gap: 10 (ref 5–15)
BUN: 28 mg/dL — ABNORMAL HIGH (ref 8–23)
CO2: 24 mmol/L (ref 22–32)
Calcium: 9.3 mg/dL (ref 8.9–10.3)
Chloride: 104 mmol/L (ref 98–111)
Creatinine, Ser: 1.14 mg/dL — ABNORMAL HIGH (ref 0.44–1.00)
GFR, Estimated: 52 mL/min — ABNORMAL LOW (ref >60)
Glucose, Bld: 106 mg/dL — ABNORMAL HIGH (ref 70–99)
Potassium: 4.3 mmol/L (ref 3.5–5.1)
Sodium: 138 mmol/L (ref 135–145)
Total Bilirubin: 0.7 mg/dL (ref 0.3–1.2)
Total Protein: 7.7 g/dL (ref 6.5–8.1)

## 2021-10-06 LAB — CBC WITH DIFFERENTIAL/PLATELET
Abs Immature Granulocytes: 0.02 10*3/uL (ref 0.00–0.07)
Basophils Absolute: 0 10*3/uL (ref 0.0–0.1)
Basophils Relative: 0 %
Eosinophils Absolute: 0.3 10*3/uL (ref 0.0–0.5)
Eosinophils Relative: 4 %
HCT: 36.1 % (ref 36.0–46.0)
Hemoglobin: 12.2 g/dL (ref 12.0–15.0)
Immature Granulocytes: 0 %
Lymphocytes Relative: 36 %
Lymphs Abs: 2.7 10*3/uL (ref 0.7–4.0)
MCH: 26.9 pg (ref 26.0–34.0)
MCHC: 33.8 g/dL (ref 30.0–36.0)
MCV: 79.5 fL — ABNORMAL LOW (ref 80.0–100.0)
Monocytes Absolute: 1 10*3/uL (ref 0.1–1.0)
Monocytes Relative: 13 %
Neutro Abs: 3.4 10*3/uL (ref 1.7–7.7)
Neutrophils Relative %: 47 %
Platelets: 221 10*3/uL (ref 150–400)
RBC: 4.54 MIL/uL (ref 3.87–5.11)
RDW: 15 % (ref 11.5–15.5)
WBC: 7.4 10*3/uL (ref 4.0–10.5)
nRBC: 0 % (ref 0.0–0.2)

## 2021-10-07 LAB — CANCER ANTIGEN 27.29: CA 27.29: 19.1 U/mL (ref 0.0–38.6)

## 2021-10-10 ENCOUNTER — Ambulatory Visit (INDEPENDENT_AMBULATORY_CARE_PROVIDER_SITE_OTHER): Payer: Medicare Other

## 2021-10-10 DIAGNOSIS — Z95810 Presence of automatic (implantable) cardiac defibrillator: Secondary | ICD-10-CM

## 2021-10-10 DIAGNOSIS — I5032 Chronic diastolic (congestive) heart failure: Secondary | ICD-10-CM

## 2021-10-11 ENCOUNTER — Ambulatory Visit (INDEPENDENT_AMBULATORY_CARE_PROVIDER_SITE_OTHER): Payer: Medicare Other | Admitting: Obstetrics & Gynecology

## 2021-10-11 ENCOUNTER — Other Ambulatory Visit: Payer: Self-pay

## 2021-10-11 ENCOUNTER — Ambulatory Visit: Payer: Medicare Other | Admitting: Oncology

## 2021-10-11 ENCOUNTER — Other Ambulatory Visit (HOSPITAL_COMMUNITY)
Admission: RE | Admit: 2021-10-11 | Discharge: 2021-10-11 | Disposition: A | Discharge status: Home / Self Care | Payer: Medicare Other | Source: Ambulatory Visit | Attending: Obstetrics & Gynecology | Admitting: Obstetrics & Gynecology

## 2021-10-11 ENCOUNTER — Encounter: Payer: Self-pay | Admitting: Obstetrics & Gynecology

## 2021-10-11 ENCOUNTER — Other Ambulatory Visit: Payer: Medicare Other

## 2021-10-11 VITALS — BP 120/80 | HR 69 | Resp 16 | Ht 61.75 in | Wt 186.0 lb

## 2021-10-11 DIAGNOSIS — C50912 Malignant neoplasm of unspecified site of left female breast: Secondary | ICD-10-CM

## 2021-10-11 DIAGNOSIS — Z780 Asymptomatic menopausal state: Secondary | ICD-10-CM

## 2021-10-11 DIAGNOSIS — Z9889 Other specified postprocedural states: Secondary | ICD-10-CM

## 2021-10-11 DIAGNOSIS — Z01419 Encounter for gynecological examination (general) (routine) without abnormal findings: Secondary | ICD-10-CM | POA: Diagnosis present

## 2021-10-11 DIAGNOSIS — Z9189 Other specified personal risk factors, not elsewhere classified: Secondary | ICD-10-CM | POA: Diagnosis not present

## 2021-10-11 DIAGNOSIS — Z9289 Personal history of other medical treatment: Secondary | ICD-10-CM | POA: Diagnosis not present

## 2021-10-11 DIAGNOSIS — C50911 Malignant neoplasm of unspecified site of right female breast: Secondary | ICD-10-CM | POA: Diagnosis not present

## 2021-10-11 DIAGNOSIS — E6609 Other obesity due to excess calories: Secondary | ICD-10-CM

## 2021-10-11 DIAGNOSIS — Z6834 Body mass index (BMI) 34.0-34.9, adult: Secondary | ICD-10-CM

## 2021-10-11 NOTE — Progress Notes (Signed)
Jeddo 1951-07-28 [id number]   History:    70 y.o. G3P2A1L2 Divorced.  Single.   RP: Established patient presenting for annual gyn exam    HPI: Postmenopause.  No HRT.  No PMB.  No pelvic pain.  Currently abstinent.  Pap Neg 08/2020.  H/O LEEP in 2010.  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  S/P Bilateral Mastectomy/Reconstruction and Bilateral Oophorectomy.  Doing chest X-Rays. H/O Heart Failure/Pace Maker.  H/O Stroke.  BMI 34.3.  Exercising 5 times a week.  Mictions/BMs wnl.  Health labs with [place].  BD 09/2020 normal.  Past medical history,surgical history, family history and social history were all reviewed and documented in the EPIC chart.  Gynecologic History No LMP recorded. Patient is postmenopausal.  Obstetric History OB History  Gravida Para Term Preterm AB Living  _0 SAB IAB Ectopic Multiple Live Births               # Outcome Date GA Lbr Len/2nd Weight Sex Delivery Anes PTL Lv  3 AB           2 Term           1 Term              ROS: A ROS was performed and pertinent positives and negatives are included in the history.  GENERAL: No fevers or chills. HEENT: No change in vision, no earache, sore throat or sinus congestion. NECK: No pain or stiffness. CARDIOVASCULAR: No chest pain or pressure. No palpitations. PULMONARY: No shortness of breath, cough or wheeze. GASTROINTESTINAL: No abdominal pain, nausea, vomiting or diarrhea, melena or bright red blood per rectum. GENITOURINARY: No urinary frequency, urgency, hesitancy or dysuria. MUSCULOSKELETAL: No joint or muscle pain, no back pain, no recent trauma. DERMATOLOGIC: No rash, no itching, no lesions. ENDOCRINE: No polyuria, polydipsia, no heat or cold intolerance. No recent change in weight. HEMATOLOGICAL: No anemia or easy bruising or bleeding. NEUROLOGIC: No headache, seizures, numbness, tingling or weakness. PSYCHIATRIC: No depression, no loss of interest in normal  activity or change in sleep pattern.     Exam:   10/11/2021 10:25 AM  BP 120/80  Pulse 69  Resp 16  Weight 186 lb (84.4 kg)  Height 5' 1.75" (1.568 m)   Other Vitals  BMI 34.30 kg/m2  BSA 1.92 m2  Menstrual status Postmenopausal  OB/Gyn status reviewed 10/11/2021  Tobacco  Smoking status Never  Smokeless status Never  Reviewed 10/11/2021       General appearance : Well developed well nourished female. No acute distress HEENT: Eyes: no retinal hemorrhage or exudates,  Neck supple, trachea midline, no carotid bruits, no thyroidmegaly Lungs: Clear to auscultation, no rhonchi or wheezes, or rib retractions  Heart: Regular rate and rhythm, no murmurs or gallops Breast:Examined in sitting and supine position, s/p Bilateral Mastectomy/Reconstruction.  No palpable masses or tenderness,  no skin retraction, no nipple inversion, no nipple discharge, no skin discoloration, no axillary or supraclavicular lymphadenopathy Abdomen: no palpable masses or tenderness, no rebound or guarding Extremities: no edema or skin discoloration or tenderness  Pelvic: Vulva: Normal             Vagina: No gross lesions or discharge  Cervix: No gross lesions or discharge.  Pap reflex done.  Uterus  AV, normal size, shape and consistency, non-tender and mobile  Adnexa  Without masses or tenderness  Anus: Normal  Assessment/Plan:  70 y.o. female for annual exam   1. Encounter for routine gynecological examination with Papanicolaou smear of cervix Postmenopause.  No HRT.  No PMB.  No pelvic pain.  Currently abstinent.  Pap Neg 08/2020.  Pap reflex done today.  H/O LEEP in 2010.  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  S/P Bilateral Mastectomy/Reconstruction and Bilateral Oophorectomy.  Doing chest X-Rays. H/O Heart Failure/Pace Maker.  H/O Stroke.  BMI 34.3.  Exercising 5 times a week.  Mictions/BMs wnl.  Health labs with [place].  BD 09/2020 normal. - Cytology - PAP( [facility])  2. At risk for infection  3. H/O LEEP - Cytology - PAP( [facility])  4. Postmenopausal Postmenopause.  No HRT.  No PMB.  No pelvic pain.  Currently abstinent. BD Normal 09/2020.  5. Bilateral malignant neoplasm of breast in female, unspecified estrogen receptor status, unspecified site of breast ([place]) S/P Bilateral Mastectomy/Reconstruction.  6. Class 1 obesity due to excess calories with serious comorbidity and body mass index (BMI) of 34.0 to 34.9 in adult Low calorie/Carb diet.  Continue fitness.  7. Other specified personal risk factors, not elsewhere classified  8. Personal history of other medical treatment   Princess Bruins MD, 10:46 AM 10/11/2021

## 2021-10-12 LAB — CYTOLOGY - PAP: Diagnosis: NEGATIVE

## 2021-10-13 ENCOUNTER — Ambulatory Visit (INDEPENDENT_AMBULATORY_CARE_PROVIDER_SITE_OTHER): Payer: Medicare Other

## 2021-10-13 ENCOUNTER — Other Ambulatory Visit: Payer: Self-pay

## 2021-10-13 DIAGNOSIS — I480 Paroxysmal atrial fibrillation: Secondary | ICD-10-CM | POA: Diagnosis not present

## 2021-10-13 DIAGNOSIS — Z7901 Long term (current) use of anticoagulants: Secondary | ICD-10-CM

## 2021-10-13 LAB — POCT INR: INR: 1.4 — AB (ref 2.0–3.0)

## 2021-10-13 NOTE — Patient Instructions (Signed)
Take 2 tablets tonight only, took 2 tablets on 12/14 and then continue 1.5 tablets daily except 1 tablet each Monday, Wednesday and Friday.  Recheck INR in 2 weeks.  [phone]

## 2021-10-14 ENCOUNTER — Telehealth: Payer: Self-pay

## 2021-10-14 NOTE — Progress Notes (Signed)
EPIC Encounter for ICM Monitoring  Patient Name: Danielle Mckinney is a 70 y.o. female Date: 10/14/2021 Primary Care Physican: Glenis Smoker, MD Primary Cardiologist: Croitoru Electrophysiologist: Santina Evans Pacing:  >99%   08/17/2021 Weight: 189 lbs   AT/AF Burden: 0% - takes Warfarin   Attempted call to patient and unable to reach.  Left detailed message per DPR regarding transmission. Transmission reviewed.    [place] Thoracic impedance normal but was suggesting possible fluid accumulation from 10/2-10/6.   Prescribed:  Furosemide 40 mg Take 40 mg by mouth daily as needed for edema.  Takes a few days a month.  Potassium 20 mEq Take 20 mEq by mouth daily as needed (when taking furosemide).  Spironolactone 25 mg take 1 tablet daily Warfarin as directed   Recommendations:  Left voice mail with ICM number and encouraged to call if experiencing any fluid symptoms.   Follow-up plan: ICM clinic phone appointment on 11/14/2021.   91 day device clinic remote transmission 12/13/2021.     EP/Cardiology Office Visits:   Last OV with Dr Lovena Le was 03/31/2020.   Copy of ICM check sent to Dr. Lovena Le.     3 month ICM trend: 10/10/2021.    12-14 Month ICM trend:       [name], RN 10/14/2021 2:49 PM

## 2021-10-14 NOTE — Telephone Encounter (Signed)
Remote ICM transmission received.  Attempted call to patient regarding ICM remote transmission and left detailed message per DPR.  Advised to return call for any fluid symptoms or questions. Next ICM remote transmission scheduled 11/14/2021.

## 2021-10-18 ENCOUNTER — Other Ambulatory Visit: Payer: Self-pay | Admitting: *Deleted

## 2021-10-18 MED ORDER — WARFARIN SODIUM 4 MG PO TABS: ORAL_TABLET | ORAL | 3 refills | Status: DC

## 2021-10-18 NOTE — Telephone Encounter (Signed)
Prescription refill request received for warfarin Lov: Croitoru, 09/26/2021 Next INR check: 12/29 Warfarin tablet strength: 4mg    Refill sent

## 2021-10-27 ENCOUNTER — Ambulatory Visit: Payer: Medicare Other | Admitting: *Deleted

## 2021-10-27 ENCOUNTER — Other Ambulatory Visit: Payer: Self-pay

## 2021-10-27 DIAGNOSIS — Z5181 Encounter for therapeutic drug level monitoring: Secondary | ICD-10-CM

## 2021-10-27 DIAGNOSIS — I480 Paroxysmal atrial fibrillation: Secondary | ICD-10-CM

## 2021-10-27 LAB — POCT INR: INR: 2.6 (ref 2.0–3.0)

## 2021-10-27 NOTE — Patient Instructions (Signed)
Description   Take 1 tablet of warfarin today, then continue 1.5 tablets daily except 1 tablet each Monday, Wednesday and Friday.  Recheck INR in 2 weeks.  [phone]

## 2021-11-14 ENCOUNTER — Ambulatory Visit (INDEPENDENT_AMBULATORY_CARE_PROVIDER_SITE_OTHER): Payer: Medicare Other

## 2021-11-14 DIAGNOSIS — Z95810 Presence of automatic (implantable) cardiac defibrillator: Secondary | ICD-10-CM

## 2021-11-14 DIAGNOSIS — I5032 Chronic diastolic (congestive) heart failure: Secondary | ICD-10-CM

## 2021-11-14 DIAGNOSIS — Z950 Presence of cardiac pacemaker: Secondary | ICD-10-CM

## 2021-11-16 ENCOUNTER — Telehealth: Payer: Self-pay

## 2021-11-16 ENCOUNTER — Other Ambulatory Visit: Payer: Self-pay

## 2021-11-16 ENCOUNTER — Ambulatory Visit (INDEPENDENT_AMBULATORY_CARE_PROVIDER_SITE_OTHER): Payer: Medicare Other

## 2021-11-16 DIAGNOSIS — Z7901 Long term (current) use of anticoagulants: Secondary | ICD-10-CM

## 2021-11-16 DIAGNOSIS — I480 Paroxysmal atrial fibrillation: Secondary | ICD-10-CM

## 2021-11-16 LAB — POCT INR: INR: 2.2 (ref 2.0–3.0)

## 2021-11-16 NOTE — Telephone Encounter (Signed)
Remote ICM transmission received.  Attempted call to patient regarding ICM remote transmission and left detailed message per DPR.  Advised to return call for any fluid symptoms or questions. Next ICM remote transmission scheduled 12/19/2021.

## 2021-11-16 NOTE — Progress Notes (Signed)
EPIC Encounter for ICM Monitoring  Patient Name: Danielle Mckinney is a 71 y.o. female Date: 11/16/2021 Primary Care Physican: Glenis Smoker, MD Primary Cardiologist: Croitoru Electrophysiologist: Santina Evans Pacing:  >99%   10/06/2021 Office Weight: 187 lbs   AT/AF Burden: 0% - takes Warfarin   Attempted call to patient and unable to reach.  Left detailed message per DPR regarding transmission. Transmission reviewed.    [place] Thoracic impedance normal.   Prescribed:  Furosemide 40 mg Take 40 mg by mouth daily as needed for edema.  Takes a few days a month.  Potassium 20 mEq Take 20 mEq by mouth daily as needed (when taking furosemide).  Spironolactone 25 mg take 1 tablet daily Warfarin as directed   Recommendations:  Left voice mail with ICM number and encouraged to call if experiencing any fluid symptoms.   Follow-up plan: ICM clinic phone appointment on 12/19/2021.   91 day device clinic remote transmission 12/13/2021.     EP/Cardiology Office Visits:   Recall 09/23/2022 with Dr Crotioru.  Last OV with Dr Lovena Le was 03/31/2020.   Copy of ICM check sent to Dr. Lovena Le.    3 month ICM trend: 11/14/2021.    12-14 Month ICM trend:     [name], RN 11/16/2021 12:37 PM

## 2021-11-16 NOTE — Patient Instructions (Signed)
continue 1.5 tablets daily except 1 tablet each Monday, Wednesday and Friday.  Recheck INR in 6 weeks.  [phone]

## 2021-12-07 ENCOUNTER — Other Ambulatory Visit: Payer: Self-pay

## 2021-12-07 MED ORDER — CARVEDILOL 6.25 MG PO TABS: ORAL_TABLET | ORAL | 3 refills | Status: DC

## 2021-12-13 ENCOUNTER — Ambulatory Visit (INDEPENDENT_AMBULATORY_CARE_PROVIDER_SITE_OTHER): Payer: Medicare Other

## 2021-12-13 DIAGNOSIS — I428 Other cardiomyopathies: Secondary | ICD-10-CM

## 2021-12-13 LAB — CUP PACEART REMOTE DEVICE CHECK
Battery Remaining Longevity: 68 mo
Battery Remaining Percentage: 72 %
Battery Voltage: 2.99 V
Brady Statistic AP VP Percent: 3.7 %
Brady Statistic AP VS Percent: 1 %
Brady Statistic AS VP Percent: 96 %
Brady Statistic AS VS Percent: 1 %
Brady Statistic RA Percent Paced: 3.6 %
Date Time Interrogation Session: 20230214031311
Implantable Lead Implant Date: 20080307
Implantable Lead Implant Date: 20080307
Implantable Lead Implant Date: 20080307
Implantable Lead Location: 753858
Implantable Lead Location: 753859
Implantable Lead Location: 753860
Implantable Lead Model: 7001
Implantable Pulse Generator Implant Date: 20210216
Lead Channel Impedance Value: 330 Ohm
Lead Channel Impedance Value: 580 Ohm
Lead Channel Impedance Value: 710 Ohm
Lead Channel Pacing Threshold Amplitude: 0.5 V
Lead Channel Pacing Threshold Amplitude: 0.75 V
Lead Channel Pacing Threshold Amplitude: 0.75 V
Lead Channel Pacing Threshold Pulse Width: 0.5 ms
Lead Channel Pacing Threshold Pulse Width: 0.5 ms
Lead Channel Pacing Threshold Pulse Width: 0.8 ms
Lead Channel Sensing Intrinsic Amplitude: 12 mV
Lead Channel Sensing Intrinsic Amplitude: 5 mV
Lead Channel Setting Pacing Amplitude: 1.5 V
Lead Channel Setting Pacing Amplitude: 1.75 V
Lead Channel Setting Pacing Amplitude: 2 V
Lead Channel Setting Pacing Pulse Width: 0.5 ms
Lead Channel Setting Pacing Pulse Width: 0.8 ms
Lead Channel Setting Sensing Sensitivity: 2 mV
Pulse Gen Model: 3222
Pulse Gen Serial Number: 9105249

## 2021-12-19 ENCOUNTER — Ambulatory Visit (INDEPENDENT_AMBULATORY_CARE_PROVIDER_SITE_OTHER): Payer: Medicare Other

## 2021-12-19 DIAGNOSIS — Z95810 Presence of automatic (implantable) cardiac defibrillator: Secondary | ICD-10-CM

## 2021-12-19 DIAGNOSIS — I5032 Chronic diastolic (congestive) heart failure: Secondary | ICD-10-CM | POA: Diagnosis not present

## 2021-12-19 NOTE — Progress Notes (Signed)
Remote ICD transmission.   

## 2021-12-21 ENCOUNTER — Telehealth: Payer: Self-pay

## 2021-12-21 NOTE — Telephone Encounter (Signed)
Remote ICM transmission received.  Attempted call to patient on home and cell phone regarding ICM remote transmission and left detailed message per DPR.  Advised to return call for any fluid symptoms or questions. Next ICM remote transmission scheduled 01/23/2022.

## 2021-12-21 NOTE — Progress Notes (Signed)
EPIC Encounter for ICM Monitoring  Patient Name: Danielle Mckinney is a 72 y.o. female Date: 12/21/2021 Primary Care Physican: Glenis Smoker, MD Primary Cardiologist: Croitoru Electrophysiologist: Santina Evans Pacing:  >99%   10/06/2021 Office Weight: 187 lbs   AT/AF Burden: 0% - takes Warfarin   Attempted call to patient and unable to reach.  Left detailed message per DPR regarding transmission. Transmission reviewed.    Corvue Thoracic impedance normal.   Prescribed:  Furosemide 40 mg Take 40 mg by mouth daily as needed for edema.  Takes a few days a month.  Potassium 20 mEq Take 20 mEq by mouth daily as needed (when taking furosemide).  Spironolactone 25 mg take 1 tablet daily Warfarin as directed   Recommendations:  Left voice mail with ICM number and encouraged to call if experiencing any fluid symptoms.   Follow-up plan: ICM clinic phone appointment on 01/23/2022.   91 day device clinic remote transmission 03/14/2022.     EP/Cardiology Office Visits:   Recall 09/23/2022 with Dr Crotioru.  Last OV with Dr Lovena Le was 03/31/2020.   Copy of ICM check sent to Dr. Lovena Le.    3 month ICM trend: 12/19/2021.    12-14 Month ICM trend:     [name], RN 12/21/2021 4:32 PM

## 2021-12-29 ENCOUNTER — Other Ambulatory Visit: Payer: Self-pay

## 2021-12-29 ENCOUNTER — Ambulatory Visit (INDEPENDENT_AMBULATORY_CARE_PROVIDER_SITE_OTHER): Payer: Medicare Other

## 2021-12-29 DIAGNOSIS — Z7901 Long term (current) use of anticoagulants: Secondary | ICD-10-CM

## 2021-12-29 DIAGNOSIS — I480 Paroxysmal atrial fibrillation: Secondary | ICD-10-CM | POA: Diagnosis not present

## 2021-12-29 LAB — POCT INR: INR: 2.5 (ref 2.0–3.0)

## 2021-12-29 MED ORDER — SPIRONOLACTONE 25 MG PO TABS
ORAL_TABLET | ORAL | 3 refills | Status: DC
Start: 2021-12-29 — End: 2023-01-01

## 2021-12-29 NOTE — Patient Instructions (Signed)
continue 1.5 tablets daily except 1 tablet each Monday, Wednesday and Friday.  Recheck INR in 6 weeks.  [phone] ?

## 2022-01-19 ENCOUNTER — Other Ambulatory Visit: Payer: Self-pay | Admitting: Cardiovascular Disease

## 2022-01-23 ENCOUNTER — Ambulatory Visit (INDEPENDENT_AMBULATORY_CARE_PROVIDER_SITE_OTHER): Payer: Medicare Other

## 2022-01-23 DIAGNOSIS — Z95810 Presence of automatic (implantable) cardiac defibrillator: Secondary | ICD-10-CM

## 2022-01-23 DIAGNOSIS — I5032 Chronic diastolic (congestive) heart failure: Secondary | ICD-10-CM | POA: Diagnosis not present

## 2022-01-23 NOTE — Progress Notes (Signed)
EPIC Encounter for ICM Monitoring ? ?Patient Name: Danielle Mckinney is a 71 y.o. female ?Date: 01/23/2022 ?Primary Care Physican: Glenis Smoker, MD ?Primary Cardiologist: Croitoru ?Electrophysiologist: Lovena Le ?Bi-V Pacing:  >99%   ?10/06/2021 Office Weight: 187 lbs ?  ?AT/AF Burden: 0% - takes Warfarin ?  ?Spoke with patient and heart failure questions reviewed.  Pt reports not feeling well today.  She ate restaurant foods for a couple of days last week which may have contribute to decreased impedance.  ?  ?Corvue Thoracic impedance suggesting possible fluid accumulation from 3/19-3/26 followed by possible dryness starting 3/27.  ?  ?Prescribed:  ?Furosemide 40 mg Take 40 mg by mouth daily as needed for edema.  Takes a few days a month.  ?Potassium 20 mEq Take 20 mEq by mouth daily as needed (when taking furosemide).  ?Spironolactone 25 mg take 1 tablet daily ?Warfarin as directed ?  ?Recommendations:  Advised to increase fluid intake today to stay hydrated.   ?  ?Follow-up plan: ICM clinic phone appointment on 02/27/2022.   91 day device clinic remote transmission 03/14/2022.   ?  ?EP/Cardiology Office Visits:   Recall 09/23/2022 with Dr Crotioru.  Last OV with Dr Lovena Le was 03/31/2020. ?  ?Copy of ICM check sent to Dr. Lovena Le. ? ?3 month ICM trend: 01/23/2022. ? ? ? ?12-14 Month ICM trend:  ? ? ? ?[name], RN ?01/23/2022 ?8:16 AM ? ?

## 2022-01-31 ENCOUNTER — Other Ambulatory Visit: Payer: Self-pay | Admitting: Family Medicine

## 2022-01-31 ENCOUNTER — Ambulatory Visit
Admission: RE | Admit: 2022-01-31 | Discharge: 2022-01-31 | Disposition: A | Discharge status: Home / Self Care | Payer: Medicare Other | Source: Ambulatory Visit | Attending: Family Medicine | Admitting: Family Medicine

## 2022-01-31 DIAGNOSIS — M5440 Lumbago with sciatica, unspecified side: Secondary | ICD-10-CM

## 2022-02-10 ENCOUNTER — Ambulatory Visit (INDEPENDENT_AMBULATORY_CARE_PROVIDER_SITE_OTHER): Payer: Medicare Other

## 2022-02-10 DIAGNOSIS — Z7901 Long term (current) use of anticoagulants: Secondary | ICD-10-CM

## 2022-02-10 DIAGNOSIS — I480 Paroxysmal atrial fibrillation: Secondary | ICD-10-CM

## 2022-02-10 LAB — POCT INR: INR: 4.7 — AB (ref 2.0–3.0)

## 2022-02-10 NOTE — Patient Instructions (Signed)
HOLD TODAY AND Saturday and then continue 1.5 tablets daily except 1 tablet each Monday, Wednesday and Friday.  Recheck INR in 3 weeks.  [phone] ?

## 2022-03-02 ENCOUNTER — Ambulatory Visit (INDEPENDENT_AMBULATORY_CARE_PROVIDER_SITE_OTHER): Payer: Medicare Other

## 2022-03-02 DIAGNOSIS — Z7901 Long term (current) use of anticoagulants: Secondary | ICD-10-CM

## 2022-03-02 DIAGNOSIS — I480 Paroxysmal atrial fibrillation: Secondary | ICD-10-CM

## 2022-03-02 LAB — POCT INR: INR: 4.3 — AB (ref 2.0–3.0)

## 2022-03-02 NOTE — Patient Instructions (Signed)
HOLD TODAY AND Friday and then Decrease to 1 tablet daily except 1.5 tablets each Sunday, Tuesday and Thursday.  Recheck INR in 2 weeks.  [phone] ?

## 2022-03-07 ENCOUNTER — Ambulatory Visit (INDEPENDENT_AMBULATORY_CARE_PROVIDER_SITE_OTHER): Payer: Medicare Other

## 2022-03-07 DIAGNOSIS — Z95810 Presence of automatic (implantable) cardiac defibrillator: Secondary | ICD-10-CM

## 2022-03-07 DIAGNOSIS — I5032 Chronic diastolic (congestive) heart failure: Secondary | ICD-10-CM

## 2022-03-09 ENCOUNTER — Telehealth: Payer: Self-pay

## 2022-03-09 NOTE — Progress Notes (Signed)
EPIC Encounter for ICM Monitoring ? ?Patient Name: Danielle Mckinney is a 71 y.o. female ?Date: 03/09/2022 ?Primary Care Physican: Glenis Smoker, MD ?Primary Cardiologist: Croitoru ?Electrophysiologist: Lovena Le ?Bi-V Pacing:  >99%   ?10/06/2021 Office Weight: 187 lbs ?  ?AT/AF Burden: 0% - takes Warfarin ?  ?Attempted call to patient and unable to reach.  Left detailed message per DPR regarding transmission. Transmission reviewed.  ?  ?Corvue Thoracic impedance normal but was suggesting possible fluid accumulation from 4/10-4/24.  ?  ?Prescribed:  ?Furosemide 40 mg Take 40 mg by mouth daily as needed for edema.  Takes a few days a month.  ?Potassium 20 mEq Take 20 mEq by mouth daily as needed (when taking furosemide).  ?Spironolactone 25 mg take 1 tablet daily ?Warfarin as directed ?  ?Recommendations: Left voice mail with ICM number and encouraged to call if experiencing any fluid symptoms. ?  ?Follow-up plan: ICM clinic phone appointment on 02/27/2022.   91 day device clinic remote transmission 03/14/2022.   ?  ?EP/Cardiology Office Visits:   Recall 09/23/2022 with Dr Crotioru.  Last OV with Dr Lovena Le was 03/31/2020. ?  ?Copy of ICM check sent to Dr. Lovena Le.  ? ?3 month ICM trend: 03/07/2022. ? ? ? ?12-14 Month ICM trend:  ? ? ? ?[name], RN ?03/09/2022 ?4:36 PM ? ?

## 2022-03-09 NOTE — Telephone Encounter (Signed)
Remote ICM transmission received.  Attempted call to patient regarding ICM remote transmission and left detailed message per DPR.  Advised to return call for any fluid symptoms or questions. Next ICM remote transmission scheduled 04/10/2022.   ? ?

## 2022-03-14 ENCOUNTER — Ambulatory Visit (INDEPENDENT_AMBULATORY_CARE_PROVIDER_SITE_OTHER): Payer: Medicare Other

## 2022-03-14 DIAGNOSIS — I428 Other cardiomyopathies: Secondary | ICD-10-CM | POA: Diagnosis not present

## 2022-03-14 LAB — CUP PACEART REMOTE DEVICE CHECK
Battery Remaining Longevity: 65 mo
Battery Remaining Percentage: 69 %
Battery Voltage: 2.99 V
Brady Statistic AP VP Percent: 5 %
Brady Statistic AP VS Percent: 1 %
Brady Statistic AS VP Percent: 95 %
Brady Statistic AS VS Percent: 1 %
Brady Statistic RA Percent Paced: 4.9 %
Date Time Interrogation Session: 20230516025215
Implantable Lead Implant Date: 20080307
Implantable Lead Implant Date: 20080307
Implantable Lead Implant Date: 20080307
Implantable Lead Location: 753858
Implantable Lead Location: 753859
Implantable Lead Location: 753860
Implantable Lead Model: 7001
Implantable Pulse Generator Implant Date: 20210216
Lead Channel Impedance Value: 310 Ohm
Lead Channel Impedance Value: 580 Ohm
Lead Channel Impedance Value: 710 Ohm
Lead Channel Pacing Threshold Amplitude: 0.5 V
Lead Channel Pacing Threshold Amplitude: 0.75 V
Lead Channel Pacing Threshold Amplitude: 0.75 V
Lead Channel Pacing Threshold Pulse Width: 0.5 ms
Lead Channel Pacing Threshold Pulse Width: 0.5 ms
Lead Channel Pacing Threshold Pulse Width: 0.8 ms
Lead Channel Sensing Intrinsic Amplitude: 12 mV
Lead Channel Sensing Intrinsic Amplitude: 5 mV
Lead Channel Setting Pacing Amplitude: 1.5 V
Lead Channel Setting Pacing Amplitude: 1.75 V
Lead Channel Setting Pacing Amplitude: 2 V
Lead Channel Setting Pacing Pulse Width: 0.5 ms
Lead Channel Setting Pacing Pulse Width: 0.8 ms
Lead Channel Setting Sensing Sensitivity: 2 mV
Pulse Gen Model: 3222
Pulse Gen Serial Number: 9105249

## 2022-03-15 ENCOUNTER — Telehealth: Payer: Self-pay | Admitting: Cardiovascular Disease

## 2022-03-15 NOTE — Telephone Encounter (Signed)
?  1. Has your device fired? no ? ?2. Is you device beeping? no ? ?3. Are you experiencing draining or swelling at device site? no ? ?4. Are you calling to see if we received your device transmission? yes ? ?5. Have you passed out?  ? ? ? ?Please route to Device Clinic Pool ? ?

## 2022-03-15 NOTE — Telephone Encounter (Signed)
Spoke with patient informed her that transmission was received  ?

## 2022-03-16 ENCOUNTER — Ambulatory Visit (INDEPENDENT_AMBULATORY_CARE_PROVIDER_SITE_OTHER): Payer: Medicare Other

## 2022-03-16 DIAGNOSIS — Z7901 Long term (current) use of anticoagulants: Secondary | ICD-10-CM

## 2022-03-16 DIAGNOSIS — I480 Paroxysmal atrial fibrillation: Secondary | ICD-10-CM

## 2022-03-16 LAB — POCT INR: INR: 3.2 — AB (ref 2.0–3.0)

## 2022-03-16 NOTE — Patient Instructions (Signed)
HOLD TONIGHT ONLY and then continue 1 tablet daily except 1.5 tablets each Sunday, Tuesday and Thursday.  Recheck INR in 4 weeks.  [phone]

## 2022-03-30 NOTE — Progress Notes (Signed)
Remote ICD transmission.   

## 2022-04-10 ENCOUNTER — Ambulatory Visit (INDEPENDENT_AMBULATORY_CARE_PROVIDER_SITE_OTHER): Payer: Medicare Other

## 2022-04-10 DIAGNOSIS — Z95810 Presence of automatic (implantable) cardiac defibrillator: Secondary | ICD-10-CM | POA: Diagnosis not present

## 2022-04-10 DIAGNOSIS — I5032 Chronic diastolic (congestive) heart failure: Secondary | ICD-10-CM

## 2022-04-14 ENCOUNTER — Ambulatory Visit (INDEPENDENT_AMBULATORY_CARE_PROVIDER_SITE_OTHER): Payer: Medicare Other | Admitting: *Deleted

## 2022-04-14 ENCOUNTER — Telehealth: Payer: Self-pay

## 2022-04-14 DIAGNOSIS — I480 Paroxysmal atrial fibrillation: Secondary | ICD-10-CM | POA: Diagnosis not present

## 2022-04-14 DIAGNOSIS — Z5181 Encounter for therapeutic drug level monitoring: Secondary | ICD-10-CM

## 2022-04-14 LAB — POCT INR: INR: 2.9 (ref 2.0–3.0)

## 2022-04-14 NOTE — Telephone Encounter (Signed)
Remote ICM transmission received.  Attempted call to patient regarding ICM remote transmission and left detailed message per DPR.  Advised to return call for any fluid symptoms or questions. Next ICM remote transmission scheduled 05/15/2022.    

## 2022-04-14 NOTE — Patient Instructions (Signed)
Description   Hold Warfarin today and then START taking warfarin 1 tablet daily excpet for 1.5 tablets on Sundays and Thursdays. Recheck INR in 2 weeks. Coumadin Clinic [phone]

## 2022-04-14 NOTE — Progress Notes (Signed)
EPIC Encounter for ICM Monitoring  Patient Name: Danielle Mckinney is a 71 y.o. female Date: 04/14/2022 Primary Care Physican: Glenis Smoker, MD Primary Cardiologist: Croitoru Electrophysiologist: Santina Evans Pacing:  >99%   10/06/2021 Office Weight: 187 lbs   AT/AF Burden: 0% - takes Warfarin   Attempted call to patient and unable to reach.  Left detailed message per DPR regarding transmission. Transmission reviewed.    Corvue Thoracic impedance suggesting normal fluid levels.   Prescribed:  Furosemide 40 mg Take 40 mg by mouth daily as needed for edema.  Takes a few days a month.  Potassium 20 mEq Take 20 mEq by mouth daily as needed (when taking furosemide).  Spironolactone 25 mg take 1 tablet daily Warfarin as directed   Recommendations: Left voice mail with ICM number and encouraged to call if experiencing any fluid symptoms.   Follow-up plan: ICM clinic phone appointment on 05/15/2022.   91 day device clinic remote transmission 06/13/2022.     EP/Cardiology Office Visits:   Recall 09/23/2022 with Dr Crotioru.  Last OV with Dr Lovena Le was 03/31/2020.   Copy of ICM check sent to Dr. Lovena Le.   3 month ICM trend: 04/10/2022.    12-14 Month ICM trend:     [name], RN 04/14/2022 3:56 PM

## 2022-05-10 ENCOUNTER — Ambulatory Visit (INDEPENDENT_AMBULATORY_CARE_PROVIDER_SITE_OTHER): Payer: Medicare Other | Admitting: *Deleted

## 2022-05-10 DIAGNOSIS — I480 Paroxysmal atrial fibrillation: Secondary | ICD-10-CM | POA: Diagnosis not present

## 2022-05-10 DIAGNOSIS — Z7901 Long term (current) use of anticoagulants: Secondary | ICD-10-CM

## 2022-05-10 LAB — POCT INR: INR: 2.3 (ref 2.0–3.0)

## 2022-05-10 NOTE — Patient Instructions (Addendum)
Description   Continue taking warfarin 1 tablet daily except for 1.5 tablets on Sundays and Thursdays. Recheck INR in 4 weeks. Coumadin Clinic [phone]

## 2022-05-15 ENCOUNTER — Ambulatory Visit (INDEPENDENT_AMBULATORY_CARE_PROVIDER_SITE_OTHER): Payer: Medicare Other

## 2022-05-15 DIAGNOSIS — I5032 Chronic diastolic (congestive) heart failure: Secondary | ICD-10-CM

## 2022-05-15 DIAGNOSIS — Z95810 Presence of automatic (implantable) cardiac defibrillator: Secondary | ICD-10-CM

## 2022-05-19 ENCOUNTER — Telehealth: Payer: Self-pay

## 2022-05-19 NOTE — Telephone Encounter (Signed)
Remote ICM transmission received.  Attempted call to patient regarding ICM remote transmission and left detailed message per DPR.  Advised to return call for any fluid symptoms or questions. Next ICM remote transmission scheduled 06/19/2022.

## 2022-05-19 NOTE — Progress Notes (Signed)
EPIC Encounter for ICM Monitoring  Patient Name: Danielle Mckinney is a 71 y.o. female Date: 05/19/2022 Primary Care Physican: Glenis Smoker, MD Primary Cardiologist: Croitoru Electrophysiologist: Santina Evans Pacing:  >99%   10/06/2021 Office Weight: 187 lbs   AT/AF Burden: 0% - takes Warfarin   Attempted call to patient and unable to reach.  Left detailed message per DPR regarding transmission. Transmission reviewed.    Corvue Thoracic impedance suggesting normal fluid levels.   Prescribed:  Furosemide 40 mg Take 40 mg by mouth daily as needed for edema.  Takes a few days a month.  Potassium 20 mEq Take 20 mEq by mouth daily as needed (when taking furosemide).  Spironolactone 25 mg take 1 tablet daily Warfarin as directed   Recommendations: Left voice mail with ICM number and encouraged to call if experiencing any fluid symptoms.   Follow-up plan: ICM clinic phone appointment on 06/19/2022.   91 day device clinic remote transmission 06/13/2022.     EP/Cardiology Office Visits:   Recall 09/23/2022 with Dr Crotioru.  Last OV with Dr Lovena Le was 03/31/2020.   Copy of ICM check sent to Dr. Lovena Le.   3 month ICM trend: 05/15/2022.    12-14 Month ICM trend:     [name], RN 05/19/2022 8:07 AM

## 2022-06-13 ENCOUNTER — Ambulatory Visit (INDEPENDENT_AMBULATORY_CARE_PROVIDER_SITE_OTHER): Payer: Medicare Other

## 2022-06-13 DIAGNOSIS — I480 Paroxysmal atrial fibrillation: Secondary | ICD-10-CM

## 2022-06-13 DIAGNOSIS — I428 Other cardiomyopathies: Secondary | ICD-10-CM | POA: Diagnosis not present

## 2022-06-13 DIAGNOSIS — Z7901 Long term (current) use of anticoagulants: Secondary | ICD-10-CM | POA: Diagnosis not present

## 2022-06-13 LAB — POCT INR: INR: 2.2 (ref 2.0–3.0)

## 2022-06-13 LAB — CUP PACEART REMOTE DEVICE CHECK
Battery Remaining Longevity: 62 mo
Battery Remaining Percentage: 65 %
Battery Voltage: 2.99 V
Brady Statistic AP VP Percent: 6.1 %
Brady Statistic AP VS Percent: 1 %
Brady Statistic AS VP Percent: 94 %
Brady Statistic AS VS Percent: 1 %
Brady Statistic RA Percent Paced: 6 %
Date Time Interrogation Session: 20230815033002
Implantable Lead Implant Date: 20080307
Implantable Lead Implant Date: 20080307
Implantable Lead Implant Date: 20080307
Implantable Lead Location: 753858
Implantable Lead Location: 753859
Implantable Lead Location: 753860
Implantable Lead Model: 7001
Implantable Pulse Generator Implant Date: 20210216
Lead Channel Impedance Value: 310 Ohm
Lead Channel Impedance Value: 580 Ohm
Lead Channel Impedance Value: 760 Ohm
Lead Channel Pacing Threshold Amplitude: 0.5 V
Lead Channel Pacing Threshold Amplitude: 0.75 V
Lead Channel Pacing Threshold Amplitude: 0.75 V
Lead Channel Pacing Threshold Pulse Width: 0.5 ms
Lead Channel Pacing Threshold Pulse Width: 0.5 ms
Lead Channel Pacing Threshold Pulse Width: 0.8 ms
Lead Channel Sensing Intrinsic Amplitude: 12 mV
Lead Channel Sensing Intrinsic Amplitude: 5 mV
Lead Channel Setting Pacing Amplitude: 1.5 V
Lead Channel Setting Pacing Amplitude: 1.75 V
Lead Channel Setting Pacing Amplitude: 2 V
Lead Channel Setting Pacing Pulse Width: 0.5 ms
Lead Channel Setting Pacing Pulse Width: 0.8 ms
Lead Channel Setting Sensing Sensitivity: 2 mV
Pulse Gen Model: 3222
Pulse Gen Serial Number: 9105249

## 2022-06-13 NOTE — Patient Instructions (Signed)
Continue taking warfarin 1 tablet daily except for 1.5 tablets on Sundays and Thursdays. Recheck INR in 6 weeks. Coumadin Clinic [phone]

## 2022-06-19 ENCOUNTER — Ambulatory Visit (INDEPENDENT_AMBULATORY_CARE_PROVIDER_SITE_OTHER): Payer: Medicare Other

## 2022-06-19 DIAGNOSIS — I5032 Chronic diastolic (congestive) heart failure: Secondary | ICD-10-CM

## 2022-06-19 DIAGNOSIS — Z95810 Presence of automatic (implantable) cardiac defibrillator: Secondary | ICD-10-CM | POA: Diagnosis not present

## 2022-06-20 NOTE — Progress Notes (Unsigned)
EPIC Encounter for ICM Monitoring  Patient Name: Danielle Mckinney is a 71 y.o. female Date: 06/20/2022 Primary Care Physican: Janina Mayo, MD Primary Cardiologist: Croitoru Electrophysiologist: Santina Evans Pacing:  >99%   10/06/2021 Office Weight: 187 lbs   AT/AF Burden: 0% - takes Warfarin   Attempted call to patient and unable to reach.  Left detailed message per DPR regarding transmission. Transmission reviewed.    Corvue Thoracic impedance suggesting normal fluid levels.   Prescribed:  Furosemide 40 mg Take 40 mg by mouth daily as needed for edema.  Takes a few days a month.  Potassium 20 mEq Take 20 mEq by mouth daily as needed (when taking furosemide).  Spironolactone 25 mg take 1 tablet daily Warfarin as directed   Recommendations: Left voice mail with ICM number and encouraged to call if experiencing any fluid symptoms.   Follow-up plan: ICM clinic phone appointment on 07/24/2022.   91 day device clinic remote transmission 09/12/2022.     EP/Cardiology Office Visits:   Recall 09/23/2022 with Dr Crotioru.  Last OV with Dr Lovena Le was 03/31/2020.  Message sent to EP scheduler to call patient for overdue appointment.    Copy of ICM check sent to Dr. Lovena Le.    3 month ICM trend: 06/19/2022.    12-14 Month ICM trend:     [name], RN 06/20/2022 4:31 PM

## 2022-06-21 ENCOUNTER — Telehealth: Payer: Self-pay

## 2022-06-21 NOTE — Telephone Encounter (Signed)
Remote ICM transmission received.  Attempted call to patient regarding ICM remote transmission and left detailed message per DPR.  Advised to return call for any fluid symptoms or questions. Next ICM remote transmission scheduled 07/24/2022.

## 2022-07-10 ENCOUNTER — Telehealth: Payer: Self-pay | Admitting: Cardiovascular Disease

## 2022-07-10 NOTE — Telephone Encounter (Signed)
Called and spoke to pt who stated she started Nitrofurantoin on Thursday for a bladder infection. Pt stated that it is a 1 week course. Informed pt that it should not interfere with her warfarin and to keep appointment that is already scheduled for 07/25/2022.

## 2022-07-10 NOTE — Telephone Encounter (Signed)
  Pt requesting to speak with RN Legrand Como, she said its about her medication. She did not say what medication

## 2022-07-13 ENCOUNTER — Other Ambulatory Visit: Payer: Self-pay | Admitting: Cardiovascular Disease

## 2022-07-13 DIAGNOSIS — I4891 Unspecified atrial fibrillation: Secondary | ICD-10-CM

## 2022-07-14 NOTE — Progress Notes (Signed)
Remote ICD transmission.   

## 2022-07-16 NOTE — Progress Notes (Unsigned)
Cardiology Office Note Date:  07/18/2022  Patient ID:  Danielle Mckinney, DOB 1951/08/08, MRN [id number] PCP:  Janina Mayo, MD  Cardiologist:  Dr. Sallyanne Kuster Electrophysiologist: Dr. Lovena Le     Chief Complaint: overdue annual visit  History of Present Illness: Danielle Mckinney is a 71 y.o. female with history of NICM, Afib, chronic CHF (systolic >> diastolic), HTN, obesity  She was seen by Dr. Lovena Le last 12/21/20, seems to evaluate her pocket, noted erythema at her device site, suspected infection device extraction.  She wanted though to 1st visit with her [place] just about to give birth.  Started on Abx in effort to get her through the visit. She had no symptoms of illness, instructed to see attention if she developed any.  No procedure was done, looks like she saw Dr. Loletha Grayer 09/26/21, she was doing well, rarely needed her lasix, no HF exacerbations, Riata lead functioning, apparently with known noise and RV sensing was programmed via LV lead.  No discussion on infection or site concerns  TODAY She is doing well Denies CP, palpitations, cardiac awareness No SOB at rest or on flat ground but inclines and stairs for years get her winded No near syncope or syncope. No bleeding or signs of bleeding  Warfarin managed by our coumadin clinic  She denies any symptoms of illness, no fever chills  Skin changes she reports are since her last procedure, not better, not worse   Device information Abbott CRT-D implanted 01/04/2007, gen change/downgrade to CRT-P on 12/16/2019   Past Medical History:  Diagnosis Date   Atrial fibrillation ([place])    Automatic implantable cardioverter-defibrillator in situ 08/25/2009   Qualifier: Diagnosis of  By: Lovena Le, MD, [facility], Binnie Kand     Biventricular ICD (implantable cardioverter-defibrillator) in place    [employer]ude   Breast cancer [facility])    Cancer ([place])    Breast cancer-BRACA I gene   CHF 08/18/2009   Qualifier: Diagnosis of  By:  Lynden Ang     CHF (congestive heart failure) ([place])    Chronic diastolic CHF (congestive heart failure) ([place]) 04/23/2012   CIN I (cervical intraepithelial neoplasia I)    CVA (cerebral vascular accident) ([place]) 01/15/2013   Essential hypertension 08/18/2009   Qualifier: Diagnosis of  By: Lynden Ang     Fibroid    Fibroid uterus 04/15/2013   Genetic testing 07/06/2015   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  The report date is January 21, 1998.    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   Stroke [facility])     Past Surgical History:  Procedure Laterality Date   BIV ICD GENERTAOR CHANGE OUT  12/20/2011   [employer]   BIV ICD GENERTAOR CHANGE OUT N/A 12/20/2011   Procedure: BIV ICD GENERTAOR CHANGE OUT;  Surgeon: Evans Lance, MD;  Location: [facility] CATH LAB;  Service: Cardiovascular;  Laterality: N/A;   BIV PACEMAKER GENERATOR CHANGEOUT N/A 12/16/2019   Procedure: DOWNGRADE TO BIV PACEMAKER;  Surgeon: Evans Lance, MD;  Location: [facility] CV LAB;  Service: Cardiovascular;  Laterality: N/A;   BREAST SURGERY     Bilateral mastctomy and reconstruction TRAM   CARDIAC DEFIBRILLATOR PLACEMENT     CERVICAL CONE BIOPSY     COLPOSCOPY     GYNECOLOGIC CRYOSURGERY     MASTECTOMY  OOPHORECTOMY     BSO   TRANSTHORACIC ECHOCARDIOGRAM  08/14/06   TUBAL LIGATION     US ECHOCARDIOGRAPHY  06/25/2012   borderline concentric LVH,LV systolic fx mildly reduced,impaired LV relaxation    Current Outpatient Medications  Medication Sig Dispense Refill   Calcium-Magnesium-Vitamin D (CALCIUM 1200+D3 PO) Take 1 tablet by mouth daily.      Cholecalciferol 25 MCG (1000 UT) tablet Take 1,000 Units by mouth daily.     furosemide (LASIX) 40 MG tablet TAKE 1 TABLET BY MOUTH EVERY DAY AS NEEDED FOR SWELLING 90 tablet 1   Krill Oil 500 MG CAPS  Take 500 mg by mouth daily.     losartan (COZAAR) 100 MG tablet TAKE 1/2 TABLET(50 MG) BY MOUTH TWICE DAILY 90 tablet 3   Meclizine HCl 25 MG CHEW Chew by mouth as needed.     OVER THE COUNTER MEDICATION Take 1 tablet by mouth daily. Olive Leaf and Oregano     Polyethyl Glycol-Propyl Glycol (SYSTANE OP) Place 1 drop into both eyes 2 (two) times daily.     potassium chloride SA (KLOR-CON) 20 MEQ tablet TAKE 1 TABLET BY MOUTH DAILY IF NEEDED(TAKE ALONG WITH FUROSEMIDE WHEN NEEDED) 30 tablet 3   pyridOXINE (VITAMIN B-6) 100 MG tablet Take 100 mg by mouth daily.     rosuvastatin (CRESTOR) 10 MG tablet Take 10 mg by mouth daily.     spironolactone (ALDACTONE) 25 MG tablet TAKE 1 TABLET(25 MG) BY MOUTH DAILY 90 tablet 3   warfarin (COUMADIN) 4 MG tablet TAKE 1 TO 2 TABLETS BY MOUTH DAILY AS DIRECTED BY COUMADIN CLINIC 45 tablet 3   carvedilol (COREG) 6.25 MG tablet TAKE 1 AND 1/2 TABLETS BY MOUTH TWICE DAILY WITH FOOD 270 tablet 3   No current facility-administered medications for this visit.    Allergies:   Codeine, Lisinopril, Oxycodone, Penicillins, and Sulfa antibiotics   Social History:  The patient  reports that she has never smoked. She has never used smokeless tobacco. She reports that she does not drink alcohol and does not use drugs.   Family History:  The patient's family history includes Breast cancer in her sister; Cirrhosis in her father; Clotting disorder in her mother; Coronary artery disease in an other family member; Heart disease in her maternal grandfather; Hypertension in her brother and sister.  ROS:  Please see the history of present illness.    All other systems are reviewed and otherwise negative.   PHYSICAL EXAM:  VS:  BP 130/80 (BP Location: Left Arm, Patient Position: Sitting, Cuff Size: Large)   Pulse 60   Ht 5' 1.75" (1.568 m)   Wt 186 lb (84.4 kg)   BMI 34.30 kg/m  BMI: Body mass index is 34.3 kg/m. Well nourished, well developed, in no acute distress HEENT:  normocephalic, atraumatic Neck: no JVD, carotid bruits or masses Cardiac:  RRR; no significant murmurs, no rubs, or gallops Lungs:  CTA b/l, no wheezing, rhonchi or rales Abd: soft, nontender MS: no deformity or atrophy Ext: no edema Skin: warm and dry, no rash Neuro:  No gross deficits appreciated Psych: euthymic mood, full affect  PPM site is has skin discoloration at the pocket that extends toward midline chest and inferiorly towards L breast. There is some tethering by my exam, only slightly tender at the very superior edge of the pocket. Not hot, perhaps slightly warm   EKG:  done today and reviewed by myself AV paced, 60bpm  Device interrogation done today and reviewed  by myself:  Battery and lead measurements are good No arrhythmias >99%BP 6% AP   12/09/2019: TTE  1. Left ventricular ejection fraction, by estimation, is 55 to 60%. The  left ventricle has normal function. The left ventrical has no regional  wall motion abnormalities. Left ventricular diastolic parameters are  consistent with Grade I diastolic  dysfunction (impaired relaxation). Elevated left ventricular pressure.   2. Right ventricular systolic function is normal. The right ventricular  size is normal. There is mildly elevated pulmonary artery systolic  pressure.   3. Left atrial size was mildly dilated.   4. Trivial mitral valve regurgitation.   5. The aortic valve is normal in structure and function. Aortic valve  regurgitation is not visualized. No aortic stenosis is present.   6. The inferior vena cava is normal in size with greater than 50%  respiratory variability, suggesting right atrial pressure of 3 mmHg.   Recent Labs: 10/06/2021: ALT 15; BUN 28; Creatinine, Ser 1.14; Hemoglobin 12.2; Platelets 221; Potassium 4.3; Sodium 138  No results found for requested labs within last 365 days.   CrCl cannot be calculated (Patient's most recent lab result is older than the maximum 21 days allowed.).    Wt Readings from Last 3 Encounters:  07/18/22 186 lb (84.4 kg)  10/11/21 186 lb (84.4 kg)  10/06/21 187 lb 1.6 oz (84.9 kg)     Other studies reviewed: Additional studies/records reviewed today include: summarized above  ASSESSMENT AND PLAN:  CRT-P Intact function No programming changes  She has skin changes and some tethering Worrisome for perhaps chronic infection No systemic symptoms  Will go ahead and get [facility] today and have her see Dr. Lovena Le  Paroxysmal Afib CHA2DS2Vasc is 4, on warfarin, monitored and managed by our coumadin clinic zero % burden  NICM Chronic CHF Recovered LVEF > diastolic No symptoms or exam findings of volume OL CoreVue at baseline and trending up C/w Dr. Sallyanne Kuster  HTN Looks good  Disposition: F/u with Dr. Lovena Le to further evaluate her pocket  Current medicines are reviewed at length with the patient today.  The patient did not have any concerns regarding medicines.  Venetia Night, PA-C 07/18/2022 5:07 PM     [address] [place] [id number] [phone] (office)  [phone] (fax)

## 2022-07-18 ENCOUNTER — Encounter: Payer: Self-pay | Admitting: Physician Assistant

## 2022-07-18 ENCOUNTER — Ambulatory Visit: Payer: Medicare Other | Attending: Physician Assistant | Admitting: Physician Assistant

## 2022-07-18 VITALS — BP 130/80 | HR 60 | Ht 61.75 in | Wt 186.0 lb

## 2022-07-18 DIAGNOSIS — Z79899 Other long term (current) drug therapy: Secondary | ICD-10-CM | POA: Diagnosis not present

## 2022-07-18 DIAGNOSIS — I5032 Chronic diastolic (congestive) heart failure: Secondary | ICD-10-CM | POA: Diagnosis not present

## 2022-07-18 DIAGNOSIS — I428 Other cardiomyopathies: Secondary | ICD-10-CM

## 2022-07-18 DIAGNOSIS — Z950 Presence of cardiac pacemaker: Secondary | ICD-10-CM | POA: Diagnosis not present

## 2022-07-18 DIAGNOSIS — I480 Paroxysmal atrial fibrillation: Secondary | ICD-10-CM

## 2022-07-18 DIAGNOSIS — I10 Essential (primary) hypertension: Secondary | ICD-10-CM

## 2022-07-18 LAB — CUP PACEART INCLINIC DEVICE CHECK
Battery Remaining Longevity: 61 mo
Battery Voltage: 2.99 V
Brady Statistic RA Percent Paced: 6 %
Brady Statistic RV Percent Paced: 99.86 %
Date Time Interrogation Session: 20230919174841
Implantable Lead Implant Date: 20080307
Implantable Lead Implant Date: 20080307
Implantable Lead Implant Date: 20080307
Implantable Lead Location: 753858
Implantable Lead Location: 753859
Implantable Lead Location: 753860
Implantable Lead Model: 7001
Implantable Pulse Generator Implant Date: 20210216
Lead Channel Impedance Value: 337.5 Ohm
Lead Channel Impedance Value: 587.5 Ohm
Lead Channel Impedance Value: 737.5 Ohm
Lead Channel Pacing Threshold Amplitude: 0.5 V
Lead Channel Pacing Threshold Amplitude: 0.75 V
Lead Channel Pacing Threshold Amplitude: 0.75 V
Lead Channel Pacing Threshold Amplitude: 0.875 V
Lead Channel Pacing Threshold Pulse Width: 0.5 ms
Lead Channel Pacing Threshold Pulse Width: 0.5 ms
Lead Channel Pacing Threshold Pulse Width: 0.8 ms
Lead Channel Pacing Threshold Pulse Width: 0.8 ms
Lead Channel Sensing Intrinsic Amplitude: 10.6 mV
Lead Channel Sensing Intrinsic Amplitude: 5 mV
Lead Channel Setting Pacing Amplitude: 1.5 V
Lead Channel Setting Pacing Amplitude: 1.75 V
Lead Channel Setting Pacing Amplitude: 2 V
Lead Channel Setting Pacing Pulse Width: 0.5 ms
Lead Channel Setting Pacing Pulse Width: 0.8 ms
Lead Channel Setting Sensing Sensitivity: 2 mV
Pulse Gen Model: 3222
Pulse Gen Serial Number: 9105249

## 2022-07-18 NOTE — Patient Instructions (Signed)
Medication Instructions:   Your physician recommends that you continue on your current medications as directed. Please refer to the Current Medication list given to you today.   *If you need a refill on your cardiac medications before your next appointment, please call your pharmacy*   Lab Work: CBC AND BMET  AND BLOOD CULTURE FIRST [facility]  If you have labs (blood work) drawn today and your tests are completely normal, you will receive your results only by: [place] (if you have MyChart) OR A paper copy in the mail If you have any lab test that is abnormal or we need to change your treatment, we will call you to review the results.   Testing/Procedures: NONE ORDERED  TODAY    Follow-Up: At [facility], you and your health needs are our priority.  As part of our continuing mission to provide you with exceptional heart care, we have created designated Provider Care Teams.  These Care Teams include your primary Cardiologist (physician) and Advanced Practice Providers (APPs -  Physician Assistants and Nurse Practitioners) who all work together to provide you with the care you need, when you need it.  We recommend signing up for the patient portal called "MyChart".  Sign up information is provided on this After Visit Summary.  MyChart is used to connect with patients for Virtual Visits (Telemedicine).  Patients are able to view lab/test results, encounter notes, upcoming appointments, etc.  Non-urgent messages can be sent to your provider as well.   To learn more about what you can do with MyChart, go to [email or web address].    Your next appointment:  AS SCHEDULED    The format for your next appointment:   In Person  Provider:   Cristopher Peru, MD    Other Instructions   Important Information About Sugar

## 2022-07-19 LAB — CBC
Hematocrit: 34.4 % (ref 34.0–46.6)
Hemoglobin: 11.6 g/dL (ref 11.1–15.9)
MCH: 26.2 pg — ABNORMAL LOW (ref 26.6–33.0)
MCHC: 33.7 g/dL (ref 31.5–35.7)
MCV: 78 fL — ABNORMAL LOW (ref 79–97)
Platelets: 201 10*3/uL (ref 150–450)
RBC: 4.42 x10E6/uL (ref 3.77–5.28)
RDW: 15.2 % (ref 11.7–15.4)
WBC: 6.5 10*3/uL (ref 3.4–10.8)

## 2022-07-19 LAB — BASIC METABOLIC PANEL
BUN/Creatinine Ratio: 20 (ref 12–28)
BUN: 16 mg/dL (ref 8–27)
CO2: 25 mmol/L (ref 20–29)
Calcium: 9.8 mg/dL (ref 8.7–10.3)
Chloride: 101 mmol/L (ref 96–106)
Creatinine, Ser: 0.81 mg/dL (ref 0.57–1.00)
Glucose: 104 mg/dL — ABNORMAL HIGH (ref 70–99)
Potassium: 4.1 mmol/L (ref 3.5–5.2)
Sodium: 139 mmol/L (ref 134–144)
eGFR: 79 mL/min/{1.73_m2} (ref >59)

## 2022-07-20 ENCOUNTER — Encounter: Payer: Self-pay | Admitting: Internal Medicine

## 2022-07-20 ENCOUNTER — Ambulatory Visit: Payer: Medicare Other | Attending: Internal Medicine | Admitting: Internal Medicine

## 2022-07-20 VITALS — BP 116/58 | HR 73 | Ht 61.75 in | Wt 183.8 lb

## 2022-07-20 DIAGNOSIS — I480 Paroxysmal atrial fibrillation: Secondary | ICD-10-CM | POA: Diagnosis not present

## 2022-07-20 DIAGNOSIS — Z95810 Presence of automatic (implantable) cardiac defibrillator: Secondary | ICD-10-CM | POA: Diagnosis not present

## 2022-07-20 DIAGNOSIS — I428 Other cardiomyopathies: Secondary | ICD-10-CM | POA: Diagnosis not present

## 2022-07-20 NOTE — Progress Notes (Signed)
HPI [name] returns today for followup. She is a pleasant 71 yo woman with a h/o LBBB and chronic systolic heart failure, s/p biv ICD insertion with normalization of her LV function. She went device change out almost 2 years ago. She had some swelling after her gen change and I was concerned about a possible infection but she eventually healed up completely. She feels well. She has noted some discoloration of her skin over the PPM pocket. She was down graded to a biv PPM previously. The patient has not had syncope and denies all infectious symptoms.  Allergies  Allergen Reactions   Codeine Nausea And Vomiting   Lisinopril Cough   Oxycodone Nausea And Vomiting   Penicillins Nausea And Vomiting    Did it involve swelling of the face/tongue/throat, SOB, or low BP? No Did it involve sudden or severe rash/hives, skin peeling, or any reaction on the inside of your mouth or nose? No Did you need to seek medical attention at a hospital or doctor's office? No When did it last happen?       If all above answers are "NO", may proceed with cephalosporin use.    Sulfa Antibiotics Nausea And Vomiting     Current Outpatient Medications  Medication Sig Dispense Refill   Calcium-Magnesium-Vitamin D (CALCIUM 1200+D3 PO) Take 1 tablet by mouth daily.      carvedilol (COREG) 6.25 MG tablet TAKE 1 AND 1/2 TABLETS BY MOUTH TWICE DAILY WITH FOOD 270 tablet 3   Cholecalciferol 25 MCG (1000 UT) tablet Take 1,000 Units by mouth daily.     furosemide (LASIX) 40 MG tablet TAKE 1 TABLET BY MOUTH EVERY DAY AS NEEDED FOR SWELLING 90 tablet 1   Krill Oil 500 MG CAPS Take 500 mg by mouth daily.     losartan (COZAAR) 100 MG tablet TAKE 1/2 TABLET(50 MG) BY MOUTH TWICE DAILY 90 tablet 3   Meclizine HCl 25 MG CHEW Chew by mouth as needed.     OVER THE COUNTER MEDICATION Take 1 tablet by mouth daily. Olive Leaf and Oregano     Polyethyl Glycol-Propyl Glycol (SYSTANE OP) Place 1 drop into both eyes 2 (two) times  daily.     potassium chloride SA (KLOR-CON) 20 MEQ tablet TAKE 1 TABLET BY MOUTH DAILY IF NEEDED(TAKE ALONG WITH FUROSEMIDE WHEN NEEDED) 30 tablet 3   pyridOXINE (VITAMIN B-6) 100 MG tablet Take 100 mg by mouth daily.     rosuvastatin (CRESTOR) 10 MG tablet Take 10 mg by mouth daily.     spironolactone (ALDACTONE) 25 MG tablet TAKE 1 TABLET(25 MG) BY MOUTH DAILY 90 tablet 3   warfarin (COUMADIN) 4 MG tablet TAKE 1 TO 2 TABLETS BY MOUTH DAILY AS DIRECTED BY COUMADIN CLINIC 45 tablet 3   No current facility-administered medications for this visit.     Past Medical History:  Diagnosis Date   Atrial fibrillation [facility])    Automatic implantable cardioverter-defibrillator in situ 08/25/2009   Qualifier: Diagnosis of  By: Lovena Le, MD, [facility], Binnie Kand     Biventricular ICD (implantable cardioverter-defibrillator) in place    St[employer]ude   Breast cancer [facility])    Cancer ([place])    Breast cancer-BRACA I gene   CHF 08/18/2009   Qualifier: Diagnosis of  By: Lynden Ang     CHF (congestive heart failure) ([place])    Chronic diastolic CHF (congestive heart failure) ([place]) 04/23/2012   CIN I (cervical intraepithelial neoplasia I)    CVA (cerebral vascular  accident) ([place]) 01/15/2013   Essential hypertension 08/18/2009   Qualifier: Diagnosis of  By: Lynden Ang     Fibroid    Fibroid uterus 04/15/2013   Genetic testing 07/06/2015   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  The report date is January 21, 1998.    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   Past Surgical History:  Procedure Laterality Date   BIV ICD GENERTAOR CHANGE OUT  12/20/2011   [place]ude   BIV ICD GENERTAOR CHANGE OUT N/A 12/20/2011   Procedure: BIV ICD GENERTAOR CHANGE OUT;   Surgeon: Evans Lance, MD;  Location: [facility] CATH LAB;  Service: Cardiovascular;  Laterality: N/A;   BIV PACEMAKER GENERATOR CHANGEOUT N/A 12/16/2019   Procedure: DOWNGRADE TO BIV PACEMAKER;  Surgeon: Evans Lance, MD;  Location: [facility] CV LAB;  Service: Cardiovascular;  Laterality: N/A;   BREAST SURGERY     Bilateral mastctomy and reconstruction TRAM   CARDIAC DEFIBRILLATOR PLACEMENT     CERVICAL CONE BIOPSY     COLPOSCOPY     GYNECOLOGIC CRYOSURGERY     MASTECTOMY     OOPHORECTOMY     BSO   TRANSTHORACIC ECHOCARDIOGRAM  08/14/06   TUBAL LIGATION     US ECHOCARDIOGRAPHY  06/25/2012   borderline concentric LVH,LV systolic fx mildly reduced,impaired LV relaxation     Family History  Problem Relation Age of Onset   Breast cancer Sister        Age 6   Hypertension Sister    Heart disease Maternal Grandfather    Clotting disorder Mother    Cirrhosis Father    Coronary artery disease Other    Hypertension Brother      Social History   Socioeconomic History   Marital status: Divorced    Spouse name: Not on file   Number of children: Not on file   Years of education: Not on file   Highest education level: Not on file  Occupational History   Not on file  Tobacco Use   Smoking status: Never   Smokeless tobacco: Never  Vaping Use   Vaping Use: Never used  Substance and Sexual Activity   Alcohol use: No    Alcohol/week: 0.0 standard drinks of alcohol   Drug use: No   Sexual activity: Not Currently    Birth control/protection: Post-menopausal    Comment: younger than 22, less than 5, hysterectomy, ovaries removed  Other Topics Concern   Not on file  Social History Narrative   Not on file   Social Determinants of Health   Financial Resource Strain: Not on file  Food Insecurity: Not on file  Transportation Needs: Not on file  Physical Activity: Not on file  Stress: Not on file  Social Connections: Not on file  Intimate Partner Violence: Not on file     BP  (!) 116/58   Pulse 73   Ht 5' 1.75" (1.568 m)   Wt 183 lb 12.8 oz (83.4 kg)   SpO2 97%   BMI 33.89 kg/m   Physical Exam:  Well appearing NAD HEENT: Unremarkable Neck:  No JVD, no thyromegally Lymphatics:  No adenopathy Back:  No CVA tenderness Lungs:  Clear with no wheezes HEART:  Regular rate rhythm, no murmurs, no rubs, no clicks Abd:  soft, positive bowel sounds, no organomegally, no rebound, no guarding Ext:  2 plus pulses, no edema, no cyanosis, no clubbing Skin:  No rashes no nodules Neuro:  CN II through XII intact, motor grossly intact  EKG - none  DEVICE  Normal device function.  See PaceArt for details.   Assess/Plan:  PPM - her biv device is working normally. She has darkening of the skin over her device but the skin is not thin and there is no fluctuance, erythema or drainage. I have recommended watchful waiting.  Chronic systolic heart failure - she has had near normalization of her LV function. We will continue her current meds.   Carleene Overlie Ailanie Ruttan,MD

## 2022-07-20 NOTE — Patient Instructions (Signed)
Medication Instructions:  Your physician recommends that you continue on your current medications as directed. Please refer to the Current Medication list given to you today.  *If you need a refill on your cardiac medications before your next appointment, please call your pharmacy*  Lab Work: None ordered.  If you have labs (blood work) drawn today and your tests are completely normal, you will receive your results only by: [place] (if you have MyChart) OR A paper copy in the mail If you have any lab test that is abnormal or we need to change your treatment, we will call you to review the results.  Testing/Procedures: None ordered.  Follow-Up: At [facility], you and your health needs are our priority.  As part of our continuing mission to provide you with exceptional heart care, we have created designated Provider Care Teams.  These Care Teams include your primary Cardiologist (physician) and Advanced Practice Providers (APPs -  Physician Assistants and Nurse Practitioners) who all work together to provide you with the care you need, when you need it.  We recommend signing up for the patient portal called "MyChart".  Sign up information is provided on this After Visit Summary.  MyChart is used to connect with patients for Virtual Visits (Telemedicine).  Patients are able to view lab/test results, encounter notes, upcoming appointments, etc.  Non-urgent messages can be sent to your provider as well.   To learn more about what you can do with MyChart, go to [email or web address].    Your next appointment:   1 year(s)  The format for your next appointment:   In Person  Provider:   Cristopher Peru, MD{or one of the following Advanced Practice Providers on your designated Care Team:   Tommye Standard, [place] Legrand Como "Jonni Sanger" Chalmers Cater, [place]  Remote monitoring is used to monitor your ICD from home. This monitoring reduces the number of office visits required to check your device to one  time per year. It allows [place] to keep an eye on the functioning of your device to ensure it is working properly. You are scheduled for a device check from home on 07/24/22. You may send your transmission at any time that day. If you have a wireless device, the transmission will be sent automatically. After your physician reviews your transmission, you will receive a postcard with your next transmission date.  Important Information About Sugar

## 2022-07-24 LAB — CULTURE, BLOOD (SINGLE)

## 2022-07-24 NOTE — Progress Notes (Signed)
ICM remote transmission reschedule from 9/25 to 08/28/2022 due to patient had office defib check 9/21 and patient requested to wait a month for next ICM remote transmission.

## 2022-07-25 ENCOUNTER — Ambulatory Visit: Payer: Medicare Other

## 2022-07-28 ENCOUNTER — Ambulatory Visit: Payer: Medicare Other | Attending: Cardiovascular Disease

## 2022-07-28 DIAGNOSIS — Z5181 Encounter for therapeutic drug level monitoring: Secondary | ICD-10-CM | POA: Diagnosis not present

## 2022-07-28 DIAGNOSIS — I480 Paroxysmal atrial fibrillation: Secondary | ICD-10-CM | POA: Diagnosis not present

## 2022-07-28 LAB — POCT INR: INR: 1.6 — AB (ref 2.0–3.0)

## 2022-07-28 NOTE — Patient Instructions (Signed)
TAKE 1.5 TABLETS ONLY and then Continue taking warfarin 1 tablet daily except for 1.5 tablets on Sundays and Thursdays. Recheck INR in 6 weeks. Coumadin Clinic [phone]

## 2022-08-28 ENCOUNTER — Ambulatory Visit (INDEPENDENT_AMBULATORY_CARE_PROVIDER_SITE_OTHER): Payer: Medicare Other

## 2022-08-28 DIAGNOSIS — Z95810 Presence of automatic (implantable) cardiac defibrillator: Secondary | ICD-10-CM

## 2022-08-28 DIAGNOSIS — I5032 Chronic diastolic (congestive) heart failure: Secondary | ICD-10-CM

## 2022-08-30 ENCOUNTER — Telehealth: Payer: Self-pay

## 2022-08-30 NOTE — Telephone Encounter (Signed)
Remote ICM transmission received.  Attempted call to patient regarding ICM remote transmission and left detailed message per DPR.  Advised to return call for any fluid symptoms or questions. Next ICM remote transmission scheduled 10/02/2022.

## 2022-08-30 NOTE — Progress Notes (Signed)
EPIC Encounter for ICM Monitoring  Patient Name: Danielle Mckinney is a 71 y.o. female Date: 08/30/2022 Primary Care Physican: Glenis Smoker, MD Primary Cardiologist: Croitoru Electrophysiologist: Santina Evans Pacing:  >99%   07/20/2022 Office Weight: 183 lbs   AT/AF Burden: 0% - takes Warfarin   Attempted call to patient and unable to reach.  Left detailed message per DPR regarding transmission. Transmission reviewed.    Corvue Thoracic impedance normal but was suggesting intermittent days with possible fluid accumulation within the last month.   Prescribed:  Furosemide 40 mg Take 40 mg by mouth daily as needed for edema.  Takes a few days a month.  Potassium 20 mEq Take 20 mEq by mouth daily as needed (when taking furosemide).  Spironolactone 25 mg take 1 tablet daily Warfarin as directed   Recommendations: Left voice mail with ICM number and encouraged to call if experiencing any fluid symptoms.   Follow-up plan: ICM clinic phone appointment on 10/02/2022.   91 day device clinic remote transmission 09/12/2022.     EP/Cardiology Office Visits:   Recall 09/23/2022 with Dr Crotioru.  Recall 07/18/2023 with Dr Lovena Le.   Copy of ICM check sent to Dr. Lovena Le.    3 month ICM trend: 08/28/2022.    12-14 Month ICM trend:     [name], RN 08/30/2022 7:22 AM

## 2022-09-06 ENCOUNTER — Ambulatory Visit: Payer: Medicare Other | Attending: Cardiovascular Disease | Admitting: *Deleted

## 2022-09-06 DIAGNOSIS — I480 Paroxysmal atrial fibrillation: Secondary | ICD-10-CM | POA: Diagnosis not present

## 2022-09-06 DIAGNOSIS — Z7901 Long term (current) use of anticoagulants: Secondary | ICD-10-CM | POA: Diagnosis not present

## 2022-09-06 DIAGNOSIS — I639 Cerebral infarction, unspecified: Secondary | ICD-10-CM | POA: Diagnosis not present

## 2022-09-06 LAB — POCT INR: INR: 2.1 (ref 2.0–3.0)

## 2022-09-06 NOTE — Patient Instructions (Signed)
Description   Continue taking warfarin 1 tablet daily except for 1.5 tablets on Sundays and Thursdays. Recheck INR in 6 weeks. Coumadin Clinic [phone]

## 2022-09-12 ENCOUNTER — Ambulatory Visit (INDEPENDENT_AMBULATORY_CARE_PROVIDER_SITE_OTHER): Payer: Medicare Other

## 2022-09-12 DIAGNOSIS — I428 Other cardiomyopathies: Secondary | ICD-10-CM | POA: Diagnosis not present

## 2022-09-13 ENCOUNTER — Telehealth: Payer: Self-pay | Admitting: Internal Medicine

## 2022-09-13 NOTE — Telephone Encounter (Signed)
  1. Has your device fired? no  2. Is you device beeping? no  3. Are you experiencing draining or swelling at device site? no  4. Are you calling to see if we received your device transmission? no  5. Have you passed out? no  Patient called stating she is having trouble sending a transmission.   Please route to [place]

## 2022-09-14 LAB — CUP PACEART REMOTE DEVICE CHECK
Battery Remaining Longevity: 59 mo
Battery Remaining Percentage: 62 %
Battery Voltage: 2.99 V
Brady Statistic AP VP Percent: 4.7 %
Brady Statistic AP VS Percent: 1 %
Brady Statistic AS VP Percent: 95 %
Brady Statistic AS VS Percent: 1 %
Brady Statistic RA Percent Paced: 4.6 %
Date Time Interrogation Session: 20231114020020
Implantable Lead Connection Status: 753985
Implantable Lead Connection Status: 753985
Implantable Lead Connection Status: 753985
Implantable Lead Implant Date: 20080307
Implantable Lead Implant Date: 20080307
Implantable Lead Implant Date: 20080307
Implantable Lead Location: 753858
Implantable Lead Location: 753859
Implantable Lead Location: 753860
Implantable Lead Model: 7001
Implantable Pulse Generator Implant Date: 20210216
Lead Channel Impedance Value: 300 Ohm
Lead Channel Impedance Value: 540 Ohm
Lead Channel Impedance Value: 710 Ohm
Lead Channel Pacing Threshold Amplitude: 0.5 V
Lead Channel Pacing Threshold Amplitude: 0.75 V
Lead Channel Pacing Threshold Amplitude: 0.75 V
Lead Channel Pacing Threshold Pulse Width: 0.5 ms
Lead Channel Pacing Threshold Pulse Width: 0.5 ms
Lead Channel Pacing Threshold Pulse Width: 0.8 ms
Lead Channel Sensing Intrinsic Amplitude: 5 mV
Lead Channel Sensing Intrinsic Amplitude: 9.2 mV
Lead Channel Setting Pacing Amplitude: 1.5 V
Lead Channel Setting Pacing Amplitude: 1.75 V
Lead Channel Setting Pacing Amplitude: 2 V
Lead Channel Setting Pacing Pulse Width: 0.5 ms
Lead Channel Setting Pacing Pulse Width: 0.8 ms
Lead Channel Setting Sensing Sensitivity: 2 mV
Pulse Gen Model: 3222
Pulse Gen Serial Number: 9105249

## 2022-09-14 NOTE — Telephone Encounter (Signed)
I called to let the patient know we did get her automatic transmission. I left the device clinic number on the voicemail to call the device clinic if she is still having trouble with her monitor.

## 2022-10-02 ENCOUNTER — Ambulatory Visit (INDEPENDENT_AMBULATORY_CARE_PROVIDER_SITE_OTHER): Payer: Medicare Other

## 2022-10-02 DIAGNOSIS — I5032 Chronic diastolic (congestive) heart failure: Secondary | ICD-10-CM | POA: Diagnosis not present

## 2022-10-02 DIAGNOSIS — Z95810 Presence of automatic (implantable) cardiac defibrillator: Secondary | ICD-10-CM

## 2022-10-06 ENCOUNTER — Telehealth: Payer: Self-pay

## 2022-10-06 NOTE — Telephone Encounter (Signed)
Remote ICM transmission received.  Attempted call to patient regarding ICM remote transmission and left detailed message per DPR.  Advised to return call for any fluid symptoms or questions. Next ICM remote transmission scheduled 11/13/2022.

## 2022-10-06 NOTE — Progress Notes (Signed)
EPIC Encounter for ICM Monitoring  Patient Name: Danielle Mckinney is a 71 y.o. female Date: 10/06/2022 Primary Care Physican: Glenis Smoker, MD Primary Cardiologist: Croitoru Electrophysiologist: Santina Evans Pacing:  >99%   07/20/2022 Office Weight: 183 lbs   AT/AF Burden: 0% - takes Warfarin   Attempted call to patient and unable to reach.  Left detailed message per DPR regarding transmission. Transmission reviewed.    Corvue Thoracic impedance suggesting normal fluid levels.   Prescribed:  Furosemide 40 mg Take 40 mg by mouth daily as needed for edema.  Takes a few days a month.  Potassium 20 mEq Take 20 mEq by mouth daily as needed (when taking furosemide).  Spironolactone 25 mg take 1 tablet daily Warfarin as directed   Recommendations: Left voice mail with ICM number and encouraged to call if experiencing any fluid symptoms.   Follow-up plan: ICM clinic phone appointment on 11/13/2022.   91 day device clinic remote transmission 12/12/2022.     EP/Cardiology Office Visits:   Recall 09/23/2022 with Dr Crotioru.  Recall 07/18/2023 with Dr Lovena Le.   Copy of ICM check sent to Dr. Lovena Le.     3 month ICM trend: 10/03/2022.    12-14 Month ICM trend:     [name], RN 10/06/2022 4:14 PM

## 2022-10-11 NOTE — Progress Notes (Signed)
Remote ICD transmission.   

## 2022-10-12 ENCOUNTER — Ambulatory Visit: Payer: Medicare Other | Admitting: Obstetrics & Gynecology

## 2022-10-12 ENCOUNTER — Ambulatory Visit (INDEPENDENT_AMBULATORY_CARE_PROVIDER_SITE_OTHER): Payer: Medicare Other | Admitting: Obstetrics & Gynecology

## 2022-10-12 ENCOUNTER — Encounter: Payer: Self-pay | Admitting: Obstetrics & Gynecology

## 2022-10-12 ENCOUNTER — Other Ambulatory Visit (HOSPITAL_COMMUNITY)
Admission: RE | Admit: 2022-10-12 | Discharge: 2022-10-12 | Disposition: A | Discharge status: Home / Self Care | Payer: Medicare Other | Source: Ambulatory Visit | Attending: Obstetrics & Gynecology | Admitting: Obstetrics & Gynecology

## 2022-10-12 VITALS — BP 116/78 | HR 69 | Ht 61.75 in | Wt 183.0 lb

## 2022-10-12 DIAGNOSIS — Z9889 Other specified postprocedural states: Secondary | ICD-10-CM

## 2022-10-12 DIAGNOSIS — Z9189 Other specified personal risk factors, not elsewhere classified: Secondary | ICD-10-CM | POA: Diagnosis not present

## 2022-10-12 DIAGNOSIS — Z9289 Personal history of other medical treatment: Secondary | ICD-10-CM

## 2022-10-12 DIAGNOSIS — C50912 Malignant neoplasm of unspecified site of left female breast: Secondary | ICD-10-CM | POA: Diagnosis not present

## 2022-10-12 DIAGNOSIS — Z01419 Encounter for gynecological examination (general) (routine) without abnormal findings: Secondary | ICD-10-CM | POA: Insufficient documentation

## 2022-10-12 DIAGNOSIS — Z780 Asymptomatic menopausal state: Secondary | ICD-10-CM

## 2022-10-12 DIAGNOSIS — Z1501 Genetic susceptibility to malignant neoplasm of breast: Secondary | ICD-10-CM

## 2022-10-12 DIAGNOSIS — Z853 Personal history of malignant neoplasm of breast: Secondary | ICD-10-CM

## 2022-10-12 DIAGNOSIS — B009 Herpesviral infection, unspecified: Secondary | ICD-10-CM

## 2022-10-12 NOTE — Progress Notes (Signed)
[place] 07/06/1951 [id number]   History:    71 y.o. G3P2A1L2 Divorced.  Single.   RP: Established patient presenting for annual gyn exam    HPI: Postmenopause.  No HRT.  No PMB.  No pelvic pain.  Currently abstinent.  Pap Neg 09/2021.  H/O LEEP in 2010.  Pap reflex today.  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  S/P Bilateral Mastectomy/Reconstruction and Bilateral Oophorectomy.  Will do chest X-Rays thru her Fam MD. H/O Heart Failure/Pace Maker.  H/O Stroke.  BMI 33.74.  Exercising regularly.  Mictions/BMs wnl.  Health labs with [place].  BD 09/2020 normal.  Flu vaccine done at pharmacy.   Past medical history,surgical history, family history and social history were all reviewed and documented in the EPIC chart.  Gynecologic History No LMP recorded. Patient is postmenopausal.  Obstetric History OB History  Gravida Para Term Preterm AB Living  _0 SAB IAB Ectopic Multiple Live Births    1          # Outcome Date GA Lbr Len/2nd Weight Sex Delivery Anes PTL Lv  3 IAB           2 Term           1 Term              ROS: A ROS was performed and pertinent positives and negatives are included in the history. GENERAL: No fevers or chills. HEENT: No change in vision, no earache, sore throat or sinus congestion. NECK: No pain or stiffness. CARDIOVASCULAR: No chest pain or pressure. No palpitations. PULMONARY: No shortness of breath, cough or wheeze. GASTROINTESTINAL: No abdominal pain, nausea, vomiting or diarrhea, melena or bright red blood per rectum. GENITOURINARY: No urinary frequency, urgency, hesitancy or dysuria. MUSCULOSKELETAL: No joint or muscle pain, no back pain, no recent trauma. DERMATOLOGIC: No rash, no itching, no lesions. ENDOCRINE: No polyuria, polydipsia, no heat or cold intolerance. No recent change in weight. HEMATOLOGICAL: No anemia or easy bruising or bleeding. NEUROLOGIC: No headache, seizures, numbness, tingling or weakness.  PSYCHIATRIC: No depression, no loss of interest in normal activity or change in sleep pattern.     Exam:   BP 116/78   Pulse 69   Ht 5' 1.75" (1.568 m)   Wt 183 lb (83 kg)   SpO2 99%   BMI 33.74 kg/m   Body mass index is 33.74 kg/m.  General appearance : Well developed well nourished female. No acute distress HEENT: Eyes: no retinal hemorrhage or exudates,  Neck supple, trachea midline, no carotid bruits, no thyroidmegaly Lungs: Clear to auscultation, no rhonchi or wheezes, or rib retractions  Heart: Regular rate and rhythm, no murmurs or gallops Breast:Examined in sitting and supine position were s/p bilateral mastectomy with reconstruction, no palpable masses or tenderness, no axillary or supraclavicular lymphadenopathy Abdomen: no palpable masses or tenderness, no rebound or guarding Extremities: no edema or skin discoloration or tenderness  Pelvic: Vulva: Normal             Vagina: No gross lesions or discharge  Cervix: No gross lesions or discharge.  Pap reflex done.  Uterus  AV, normal size, shape and consistency, non-tender and mobile  Adnexa  Without masses or tenderness  Anus: Normal   Assessment/Plan:  71 y.o. female for annual exam   1. Encounter for routine gynecological examination with Papanicolaou smear of cervix Postmenopause.  No HRT.  No PMB.  No pelvic pain.  Currently abstinent.  Pap Neg 09/2021.  H/O LEEP in 2010.  Pap reflex today.  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  S/P Bilateral Mastectomy/Reconstruction and Bilateral Oophorectomy.  Will do chest X-Rays thru her Fam MD. H/O Heart Failure/Pace Maker.  H/O Stroke.  BMI 33.74.  Exercising regularly.  Mictions/BMs wnl.  Health labs with [place].  BD 09/2020 normal.  Flu vaccine done at pharmacy. - Cytology - PAP( [place])  2. H/O LEEP - Cytology - PAP( )  3. Postmenopausal Postmenopause.  No HRT.  No PMB.  No pelvic pain.  Currently abstinent. BD  09/2020 normal.  4. BRCA1 gene mutation ([place]) S/P Bilateral Mastectomy and BSO.  5. Personal history of breast cancer  6. Other specified personal risk factors, not elsewhere classified  7. Personal history of other medical treatment  Other orders - valACYclovir (VALTREX) 500 MG tablet; Take 500 mg by mouth as needed.   Princess Bruins MD, 3:30 PM

## 2022-10-17 ENCOUNTER — Ambulatory Visit: Payer: Medicare Other | Attending: Cardiology | Admitting: *Deleted

## 2022-10-17 DIAGNOSIS — I480 Paroxysmal atrial fibrillation: Secondary | ICD-10-CM

## 2022-10-17 DIAGNOSIS — Z7901 Long term (current) use of anticoagulants: Secondary | ICD-10-CM | POA: Diagnosis not present

## 2022-10-17 LAB — POCT INR: INR: 1.8 — AB (ref 2.0–3.0)

## 2022-10-17 NOTE — Patient Instructions (Signed)
Description   Continue taking warfarin 1 tablet daily except for 1.5 tablets on Sundays and Thursdays. Recheck INR in 6 weeks. Coumadin Clinic [phone]

## 2022-10-19 LAB — CYTOLOGY - PAP

## 2022-10-27 ENCOUNTER — Other Ambulatory Visit: Payer: Self-pay

## 2022-10-27 DIAGNOSIS — R87612 Low grade squamous intraepithelial lesion on cytologic smear of cervix (LGSIL): Secondary | ICD-10-CM

## 2022-11-13 ENCOUNTER — Ambulatory Visit (INDEPENDENT_AMBULATORY_CARE_PROVIDER_SITE_OTHER): Payer: Medicare Other

## 2022-11-13 DIAGNOSIS — Z95810 Presence of automatic (implantable) cardiac defibrillator: Secondary | ICD-10-CM | POA: Diagnosis not present

## 2022-11-13 DIAGNOSIS — I5032 Chronic diastolic (congestive) heart failure: Secondary | ICD-10-CM

## 2022-11-16 NOTE — Progress Notes (Signed)
EPIC Encounter for ICM Monitoring  Patient Name: Danielle Mckinney is a 72 y.o. female Date: 11/16/2022 Primary Care Physican: Glenis Smoker, MD Primary Cardiologist: Croitoru Electrophysiologist: Santina Evans Pacing:  >99%   07/20/2022 Office Weight: 183 lbs   AT/AF Burden: 0% - takes Warfarin   Spoke with patient and heart failure questions reviewed.  Transmission results reviewed.  Pt asymptomatic for fluid accumulation.  Reports feeling well at this time and voices no complaints.      Corvue Thoracic impedance suggesting normal fluid levels.   Prescribed:  Furosemide 40 mg Take 40 mg by mouth daily as needed for edema.  Takes a few days a month.  Potassium 20 mEq Take 20 mEq by mouth daily as needed (when taking furosemide).  Spironolactone 25 mg take 1 tablet daily Warfarin as directed   Labs: 07/18/2022 Creatinine 0.81, BUN 16, Potassium 4.1, Sodium 139, GFR 79 A complete set of results can be found in Results Review.  Recommendations:  No changes and encouraged to call if experiencing any fluid symptoms.   Follow-up plan: ICM clinic phone appointment on 12/19/2022.   91 day device clinic remote transmission 12/12/2022.     EP/Cardiology Office Visits:   Recall 09/23/2022 with Dr Crotioru.  Recall 07/28/2023 with Dr Lovena Le.   Copy of ICM check sent to Dr. Lovena Le.    3 month ICM trend: 11/14/2022.    12-14 Month ICM trend:     [name], RN 11/16/2022 10:53 AM

## 2022-11-27 ENCOUNTER — Other Ambulatory Visit: Payer: Self-pay | Admitting: Cardiovascular Disease

## 2022-11-27 DIAGNOSIS — I4891 Unspecified atrial fibrillation: Secondary | ICD-10-CM

## 2022-11-28 ENCOUNTER — Other Ambulatory Visit (HOSPITAL_COMMUNITY)
Admission: RE | Admit: 2022-11-28 | Discharge: 2022-11-28 | Disposition: A | Discharge status: Home / Self Care | Payer: Medicare Other | Source: Ambulatory Visit | Attending: Obstetrics & Gynecology | Admitting: Obstetrics & Gynecology

## 2022-11-28 ENCOUNTER — Ambulatory Visit (INDEPENDENT_AMBULATORY_CARE_PROVIDER_SITE_OTHER): Payer: Medicare Other | Admitting: Obstetrics & Gynecology

## 2022-11-28 ENCOUNTER — Encounter: Payer: Self-pay | Admitting: Obstetrics & Gynecology

## 2022-11-28 DIAGNOSIS — R87612 Low grade squamous intraepithelial lesion on cytologic smear of cervix (LGSIL): Secondary | ICD-10-CM | POA: Insufficient documentation

## 2022-11-28 NOTE — Progress Notes (Signed)
    Danielle Mckinney 12/23/1950 [id number]        72 y.o.  G3P2A1L2   RP: LGSIL on Pap 10/12/2022 for Colposcopy  HPI: LGSIL on Pap 10/12/2022.  H/O LEEP in 2010.   OB History  Gravida Para Term Preterm AB Living  '3 2 2   1 2  '$ SAB IAB Ectopic Multiple Live Births    1          # Outcome Date GA Lbr Len/2nd Weight Sex Delivery Anes PTL Lv  3 IAB           2 Term           1 Term             Past medical history,surgical history, problem list, medications, allergies, family history and social history were all reviewed and documented in the EPIC chart.   Directed ROS with pertinent positives and negatives documented in the history of present illness/assessment and plan.  Exam:  Vitals:   11/28/22 1534  BP: 110/78  Pulse: 70  SpO2: 99%   General appearance:  Normal  Colposcopy Procedure Note Danielle Mckinney 11/28/2022  Indications: LGSIL  Procedure Details  The risks and benefits of the procedure and Written informed consent obtained.  Speculum placed in vagina and excellent visualization of cervix achieved, cervix swabbed x 3 with acetic acid solution.  Findings:  Cervix colposcopy: Physical Exam Genitourinary:        Vaginal colposcopy: Normal  Vulvar colposcopy: Normal  Perirectal colposcopy: Normal  The cervix was sprayed with Hurricane before performing the cervical biopsies.  Specimens: HPV HR/16-18-45, Cervical Bx at 1 O'Clock  Complications: None, good hemostasis with Silver Nitrate . Plan: Management per results   Assessment/Plan:  72 y.o. G3P2012   1. 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  Colpo procedure and findings reviewed with patient.  Management per results.  Post procedure precautions. - Colposcopy - Cervicovaginal ancillary only( [place]) - Surgical pathology( [place]/ [facility])  Other orders - meclizine (ANTIVERT) 25 MG tablet; Take 25 mg by mouth 3 (three) times daily as needed for dizziness.    Princess Bruins MD, 3:46 PM 11/28/2022

## 2022-11-30 ENCOUNTER — Ambulatory Visit: Payer: Medicare Other | Attending: Internal Medicine

## 2022-11-30 DIAGNOSIS — I480 Paroxysmal atrial fibrillation: Secondary | ICD-10-CM

## 2022-11-30 DIAGNOSIS — Z5181 Encounter for therapeutic drug level monitoring: Secondary | ICD-10-CM | POA: Diagnosis not present

## 2022-11-30 LAB — POCT INR: INR: 1.5 — AB (ref 2.0–3.0)

## 2022-11-30 LAB — CERVICOVAGINAL ANCILLARY ONLY
Comment: NEGATIVE
Comment: NEGATIVE
Comment: NEGATIVE
HPV 16: NEGATIVE
HPV 18 / 45: POSITIVE — AB
High risk HPV: POSITIVE — AB

## 2022-11-30 LAB — SURGICAL PATHOLOGY

## 2022-11-30 NOTE — Patient Instructions (Signed)
Description   Take 2 tablets today and then continue taking warfarin 1 tablet daily except for 1.5 tablets on Sundays and Thursdays. Recheck INR in 4 weeks.  Coumadin Clinic [phone]

## 2022-12-12 ENCOUNTER — Ambulatory Visit: Payer: Medicare Other

## 2022-12-12 DIAGNOSIS — I428 Other cardiomyopathies: Secondary | ICD-10-CM

## 2022-12-12 LAB — CUP PACEART REMOTE DEVICE CHECK
Battery Remaining Longevity: 56 mo
Battery Remaining Percentage: 59 %
Battery Voltage: 2.99 V
Brady Statistic AP VP Percent: 5.3 %
Brady Statistic AP VS Percent: 1 %
Brady Statistic AS VP Percent: 95 %
Brady Statistic AS VS Percent: 1 %
Brady Statistic RA Percent Paced: 5.1 %
Date Time Interrogation Session: 20240213034744
Implantable Lead Connection Status: 753985
Implantable Lead Connection Status: 753985
Implantable Lead Connection Status: 753985
Implantable Lead Implant Date: 20080307
Implantable Lead Implant Date: 20080307
Implantable Lead Implant Date: 20080307
Implantable Lead Location: 753858
Implantable Lead Location: 753859
Implantable Lead Location: 753860
Implantable Lead Model: 7001
Implantable Pulse Generator Implant Date: 20210216
Lead Channel Impedance Value: 330 Ohm
Lead Channel Impedance Value: 540 Ohm
Lead Channel Impedance Value: 700 Ohm
Lead Channel Pacing Threshold Amplitude: 0.5 V
Lead Channel Pacing Threshold Amplitude: 0.75 V
Lead Channel Pacing Threshold Amplitude: 0.75 V
Lead Channel Pacing Threshold Pulse Width: 0.5 ms
Lead Channel Pacing Threshold Pulse Width: 0.5 ms
Lead Channel Pacing Threshold Pulse Width: 0.8 ms
Lead Channel Sensing Intrinsic Amplitude: 12 mV
Lead Channel Sensing Intrinsic Amplitude: 5 mV
Lead Channel Setting Pacing Amplitude: 1.5 V
Lead Channel Setting Pacing Amplitude: 1.75 V
Lead Channel Setting Pacing Amplitude: 2 V
Lead Channel Setting Pacing Pulse Width: 0.5 ms
Lead Channel Setting Pacing Pulse Width: 0.8 ms
Lead Channel Setting Sensing Sensitivity: 2 mV
Pulse Gen Model: 3222
Pulse Gen Serial Number: 9105249

## 2022-12-18 ENCOUNTER — Other Ambulatory Visit: Payer: Self-pay

## 2022-12-18 MED ORDER — CARVEDILOL 6.25 MG PO TABS: ORAL_TABLET | ORAL | 0 refills | Status: DC

## 2022-12-19 ENCOUNTER — Ambulatory Visit: Payer: Medicare Other

## 2022-12-19 DIAGNOSIS — I5032 Chronic diastolic (congestive) heart failure: Secondary | ICD-10-CM

## 2022-12-19 DIAGNOSIS — Z95810 Presence of automatic (implantable) cardiac defibrillator: Secondary | ICD-10-CM | POA: Diagnosis not present

## 2022-12-26 NOTE — Progress Notes (Signed)
EPIC Encounter for ICM Monitoring  Patient Name: Danielle Mckinney is a 72 y.o. female Date: 12/26/2022 Primary Care Physican: Glenis Smoker, MD Primary Cardiologist: Croitoru Electrophysiologist: Santina Evans Pacing:  >99%   07/20/2022 Office Weight: 183 lbs   AT/AF Burden: 0% - takes Warfarin   Transmission reviewed.    Corvue Thoracic impedance suggesting normal fluid levels.   Prescribed:  Furosemide 40 mg Take 40 mg by mouth daily as needed for edema.  Takes a few days a month.  Potassium 20 mEq Take 20 mEq by mouth daily as needed (when taking furosemide).  Spironolactone 25 mg take 1 tablet daily Warfarin as directed   Labs: 07/18/2022 Creatinine 0.81, BUN 16, Potassium 4.1, Sodium 139, GFR 79 A complete set of results can be found in Results Review.   Recommendations:  No changes.   Follow-up plan: ICM clinic phone appointment on 01/22/2023.   91 day device clinic remote transmission 03/13/2023.     EP/Cardiology Office Visits:   Recall 09/23/2022 with Dr Crotioru.  Recall 07/28/2023 with Dr Lovena Le.   Copy of ICM check sent to Dr. Lovena Le.    3 month ICM trend: 12/26/2022.    12-14 Month ICM trend:     [name], RN 12/26/2022 8:20 AM

## 2022-12-28 ENCOUNTER — Ambulatory Visit: Payer: Medicare Other

## 2023-01-01 ENCOUNTER — Other Ambulatory Visit: Payer: Self-pay

## 2023-01-01 MED ORDER — SPIRONOLACTONE 25 MG PO TABS: ORAL_TABLET | ORAL | 3 refills | Status: DC

## 2023-01-03 ENCOUNTER — Ambulatory Visit: Payer: Medicare Other | Attending: Internal Medicine | Admitting: *Deleted

## 2023-01-03 DIAGNOSIS — I480 Paroxysmal atrial fibrillation: Secondary | ICD-10-CM | POA: Diagnosis not present

## 2023-01-03 DIAGNOSIS — Z7901 Long term (current) use of anticoagulants: Secondary | ICD-10-CM | POA: Diagnosis not present

## 2023-01-03 DIAGNOSIS — I639 Cerebral infarction, unspecified: Secondary | ICD-10-CM | POA: Diagnosis not present

## 2023-01-03 LAB — POCT INR: INR: 2.3 (ref 2.0–3.0)

## 2023-01-03 NOTE — Patient Instructions (Signed)
Description   Continue taking warfarin 1 tablet daily except for 1.5 tablets on Sundays and Thursdays. Recheck INR in 6 weeks per pt request. Coumadin Clinic [phone]

## 2023-01-10 ENCOUNTER — Telehealth: Payer: Self-pay | Admitting: Internal Medicine

## 2023-01-10 ENCOUNTER — Encounter: Payer: Self-pay | Admitting: Internal Medicine

## 2023-01-10 NOTE — Telephone Encounter (Signed)
This is a Danielle Mckinney pt

## 2023-01-10 NOTE — Telephone Encounter (Signed)
error 

## 2023-01-10 NOTE — Telephone Encounter (Signed)
*  STAT* If patient is at the pharmacy, call can be transferred to refill team.   1. Which medications need to be refilled? (please list name of each medication and dose if known) losartan (COZAAR) 100 MG tablet   2. Which pharmacy/location (including street and city if local pharmacy) is medication to be sent to? [place], [place] - [address] AT [place]   3. Do they need a 30 day or 90 day supply? 90 day  Patient will be out of town for a month. She will be leaving tonight and needs it before she leaves.

## 2023-01-11 MED ORDER — LOSARTAN POTASSIUM 100 MG PO TABS: 50.0000 mg | ORAL_TABLET | Freq: Two times a day (BID) | ORAL | 1 refills | Status: DC

## 2023-01-11 NOTE — Telephone Encounter (Signed)
Refills has been sent to the pharmacy. 

## 2023-01-15 NOTE — Progress Notes (Signed)
Remote ICD transmission.   

## 2023-01-26 NOTE — Progress Notes (Signed)
No ICM remote transmission received for 01/22/2023 and next ICM transmission scheduled for 02/12/2023.

## 2023-02-12 ENCOUNTER — Ambulatory Visit: Payer: Medicare Other | Attending: Internal Medicine

## 2023-02-12 DIAGNOSIS — Z95810 Presence of automatic (implantable) cardiac defibrillator: Secondary | ICD-10-CM

## 2023-02-12 DIAGNOSIS — I5032 Chronic diastolic (congestive) heart failure: Secondary | ICD-10-CM

## 2023-02-14 ENCOUNTER — Telehealth: Payer: Self-pay

## 2023-02-14 NOTE — Progress Notes (Signed)
EPIC Encounter for ICM Monitoring  Patient Name: Danielle Mckinney is a 72 y.o. female Date: 02/14/2023 Primary Care Physican: Shon Hale, MD Primary Cardiologist: Croitoru Electrophysiologist: Rae Roam Pacing:  >99%   07/20/2022 Office Weight: 183 lbs   AT/AF Burden: 0%    Attempted call to patient and unable to reach.  Left detailed message per DPR regarding transmission. Transmission reviewed.    Corvue Thoracic impedance suggesting normal fluid levels.   Prescribed:  Furosemide 40 mg Take 40 mg by mouth daily as needed for edema.  Takes a few days a month.  Potassium 20 mEq Take 20 mEq by mouth daily as needed (when taking furosemide).  Spironolactone 25 mg take 1 tablet daily Warfarin as directed   Labs: 07/18/2022 Creatinine 0.81, BUN 16, Potassium 4.1, Sodium 139, GFR 79 A complete set of results can be found in Results Review.   Recommendations:  Left voice mail with ICM number and encouraged to call if experiencing any fluid symptoms.   Follow-up plan: ICM clinic phone appointment on 03/19/2023.   91 day device clinic remote transmission 03/13/2023.     EP/Cardiology Office Visits:   Recall 09/23/2022 with Dr Crotioru.  Recall 07/28/2023 with Dr Ladona Ridgel.   Copy of ICM check sent to Dr. Ladona Ridgel.     3 month ICM trend: 02/11/2023.    12-14 Month ICM trend:     Karie Soda, RN 02/14/2023 9:58 AM

## 2023-02-14 NOTE — Telephone Encounter (Signed)
Remote ICM transmission received.  Attempted call to patient regarding ICM remote transmission and left detailed message per DPR.  Advised to return call for any fluid symptoms or questions. Next ICM remote transmission scheduled 03/19/2023.

## 2023-02-15 ENCOUNTER — Ambulatory Visit: Payer: Medicare Other | Attending: Cardiology | Admitting: *Deleted

## 2023-02-15 DIAGNOSIS — I639 Cerebral infarction, unspecified: Secondary | ICD-10-CM

## 2023-02-15 DIAGNOSIS — Z7901 Long term (current) use of anticoagulants: Secondary | ICD-10-CM | POA: Diagnosis not present

## 2023-02-15 DIAGNOSIS — I480 Paroxysmal atrial fibrillation: Secondary | ICD-10-CM

## 2023-02-15 LAB — POCT INR: INR: 2.8 (ref 2.0–3.0)

## 2023-02-15 NOTE — Patient Instructions (Addendum)
Description   Today take 1 tablet then continue taking warfarin 1 tablet daily except for 1.5 tablets on Sundays and Thursdays. Recheck INR in 5 weeks. [facility] [phone]

## 2023-02-16 ENCOUNTER — Telehealth: Payer: Self-pay | Admitting: Cardiovascular Disease

## 2023-02-16 NOTE — Telephone Encounter (Signed)
I think she should continue following up with both of [place].  Dr. 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  She can ask him if he agrees to also take care of management of medications for heart failure and other cardiac issues, but I suspect he would like to leave that up to me and focus just on her device.

## 2023-02-16 NOTE — Telephone Encounter (Signed)
Returned call to patient and made her aware.   Patient will stay with Dr. Salena Saner for gen cards and Dr. Ladona Ridgel for EP.   Advised patient to call back to office with any issues, questions, or concerns. Patient verbalized understanding.

## 2023-02-16 NOTE — Telephone Encounter (Signed)
Patient would like to know if she should still be seeing Dr. Royann Shivers for device checks. She states she spoke with Randon Goldsmith and she informed her that she was overdue for her follow up, but she sees Dr. Ladona Ridgel for the same thing. Please advise.

## 2023-02-16 NOTE — Telephone Encounter (Signed)
Returned call to patient who states she was unsure if she still needed to follow up with Dr. Salena Saner as she follows with Dr. Ladona Ridgel as well. Advised patient that she could see Dr. Salena Saner as her General Cardiologist. Patient would like to verify with Dr. Salena Saner. Will forward message over.

## 2023-02-19 ENCOUNTER — Other Ambulatory Visit: Payer: Self-pay

## 2023-02-19 MED ORDER — CARVEDILOL 6.25 MG PO TABS: ORAL_TABLET | ORAL | 0 refills | Status: DC

## 2023-03-13 ENCOUNTER — Ambulatory Visit (INDEPENDENT_AMBULATORY_CARE_PROVIDER_SITE_OTHER): Payer: Medicare Other

## 2023-03-13 DIAGNOSIS — I428 Other cardiomyopathies: Secondary | ICD-10-CM | POA: Diagnosis not present

## 2023-03-16 LAB — CUP PACEART REMOTE DEVICE CHECK
Battery Remaining Longevity: 54 mo
Battery Remaining Percentage: 56 %
Battery Voltage: 2.99 V
Brady Statistic AP VP Percent: 5.4 %
Brady Statistic AP VS Percent: 1 %
Brady Statistic AS VP Percent: 94 %
Brady Statistic AS VS Percent: 1 %
Brady Statistic RA Percent Paced: 5.3 %
Date Time Interrogation Session: 20240517012449
Implantable Lead Connection Status: 753985
Implantable Lead Connection Status: 753985
Implantable Lead Connection Status: 753985
Implantable Lead Implant Date: 20080307
Implantable Lead Implant Date: 20080307
Implantable Lead Implant Date: 20080307
Implantable Lead Location: 753858
Implantable Lead Location: 753859
Implantable Lead Location: 753860
Implantable Lead Model: 7001
Implantable Pulse Generator Implant Date: 20210216
Lead Channel Impedance Value: 340 Ohm
Lead Channel Impedance Value: 580 Ohm
Lead Channel Impedance Value: 730 Ohm
Lead Channel Pacing Threshold Amplitude: 0.5 V
Lead Channel Pacing Threshold Amplitude: 0.75 V
Lead Channel Pacing Threshold Amplitude: 0.875 V
Lead Channel Pacing Threshold Pulse Width: 0.5 ms
Lead Channel Pacing Threshold Pulse Width: 0.5 ms
Lead Channel Pacing Threshold Pulse Width: 0.8 ms
Lead Channel Sensing Intrinsic Amplitude: 12 mV
Lead Channel Sensing Intrinsic Amplitude: 5 mV
Lead Channel Setting Pacing Amplitude: 1.5 V
Lead Channel Setting Pacing Amplitude: 1.75 V
Lead Channel Setting Pacing Amplitude: 2 V
Lead Channel Setting Pacing Pulse Width: 0.5 ms
Lead Channel Setting Pacing Pulse Width: 0.8 ms
Lead Channel Setting Sensing Sensitivity: 2 mV
Pulse Gen Model: 3222
Pulse Gen Serial Number: 9105249

## 2023-03-19 ENCOUNTER — Ambulatory Visit: Payer: Medicare Other | Attending: Internal Medicine

## 2023-03-19 ENCOUNTER — Telehealth: Payer: Self-pay | Admitting: Internal Medicine

## 2023-03-19 DIAGNOSIS — Z95810 Presence of automatic (implantable) cardiac defibrillator: Secondary | ICD-10-CM

## 2023-03-19 DIAGNOSIS — I5032 Chronic diastolic (congestive) heart failure: Secondary | ICD-10-CM

## 2023-03-19 NOTE — Telephone Encounter (Signed)
I let the patient know that we did receive her transmission. 

## 2023-03-19 NOTE — Telephone Encounter (Signed)
Patient would like to know if her transmission has been received.

## 2023-03-20 ENCOUNTER — Telehealth: Payer: Self-pay

## 2023-03-20 NOTE — Progress Notes (Signed)
EPIC Encounter for ICM Monitoring  Patient Name: Danielle Mckinney is a 72 y.o. female Date: 03/20/2023 Primary Care Physican: Shon Hale, MD Primary Cardiologist: Croitoru Electrophysiologist: Rae Roam Pacing:  >99%   07/20/2022 Office Weight: 183 lbs   AT/AF Burden: 0%    Attempted call to patient and unable to reach.  Left detailed message per DPR regarding transmission. Transmission reviewed.    Corvue Thoracic impedance suggesting normal fluid levels with the exception of possible fluid accumulation from 4/25-5/5 and 5/12-5/18.   Prescribed:  Furosemide 40 mg Take 40 mg by mouth daily as needed for edema.  Takes a few days a month.  Potassium 20 mEq Take 20 mEq by mouth daily as needed (when taking furosemide).  Spironolactone 25 mg take 1 tablet daily Warfarin as directed   Labs: 07/18/2022 Creatinine 0.81, BUN 16, Potassium 4.1, Sodium 139, GFR 79 A complete set of results can be found in Results Review.   Recommendations:  Left voice mail with ICM number and encouraged to call if experiencing any fluid symptoms.   Follow-up plan: ICM clinic phone appointment on 04/23/2023.   91 day device clinic remote transmission 03/13/2023.     EP/Cardiology Office Visits:   Left message on 03/20/2023 to contact Dr Royann Shivers for overdue appointment.  Recall 09/23/2022 with Dr Crotioru.  Recall 07/28/2023 with Dr Ladona Ridgel.   Copy of ICM check sent to Dr. Ladona Ridgel.    3 month ICM trend: 03/19/2023.    12-14 Month ICM trend:     Karie Soda, RN 03/20/2023 3:22 PM

## 2023-03-20 NOTE — Telephone Encounter (Signed)
Remote ICM transmission received.  Attempted call to patient regarding ICM remote transmission and left detailed message per DPR.  Advised to return call for any fluid symptoms or questions. Next ICM remote transmission scheduled 04/16/2023.    

## 2023-03-21 ENCOUNTER — Other Ambulatory Visit: Payer: Self-pay | Admitting: Cardiovascular Disease

## 2023-03-21 MED ORDER — CARVEDILOL 6.25 MG PO TABS: 9.7500 mg | ORAL_TABLET | Freq: Two times a day (BID) | ORAL | 0 refills | Status: DC

## 2023-03-21 NOTE — Addendum Note (Signed)
Addended by: Margaret Pyle D on: 03/21/2023 03:20 PM   Modules accepted: Orders

## 2023-03-22 ENCOUNTER — Ambulatory Visit: Payer: Medicare Other | Attending: Cardiovascular Disease | Admitting: *Deleted

## 2023-03-22 DIAGNOSIS — I480 Paroxysmal atrial fibrillation: Secondary | ICD-10-CM

## 2023-03-22 DIAGNOSIS — Z5181 Encounter for therapeutic drug level monitoring: Secondary | ICD-10-CM | POA: Diagnosis not present

## 2023-03-22 LAB — POCT INR: POC INR: 1.1

## 2023-03-22 NOTE — Patient Instructions (Signed)
Description    Take 2 tablets of warfarin today and 1.5 tablets of warfarin tomorrow then continue taking warfarin 1 tablet daily except for 1.5 tablets on Sunday and Thursday. Recheck INR in 1 week.  [facility] [phone]

## 2023-03-23 ENCOUNTER — Telehealth: Payer: Self-pay | Admitting: Cardiovascular Disease

## 2023-03-23 NOTE — Telephone Encounter (Signed)
Called pt to inform her that her medication was sent to her pharmacy as requested on 03/21/23 for a 30 day supply asking pt to schedule an overdue appointment with Dr. Royann Shivers. Pt stated that she would make the appointment. I advised the pt that if she has any other problems, questions or concerns to give our office a call back. Pt verbalized understanding.

## 2023-03-23 NOTE — Telephone Encounter (Signed)
Returned call to patient- patient also states that she just called and made follow up for August- which will be 1 year. Patient denies any issues/symptoms.   Advised patient to call back to office with any issues, questions, or concerns. Patient verbalized understanding.

## 2023-03-23 NOTE — Telephone Encounter (Signed)
Patient is past due for cardiology f/u visits and wants a call back to discuss which cardiologist she needs to see next.

## 2023-03-23 NOTE — Telephone Encounter (Signed)
*  STAT* If patient is at the pharmacy, call can be transferred to refill team.   1. Which medications need to be refilled? (please list name of each medication and dose if known)   carvedilol (COREG) 6.25 MG tablet   2. Which pharmacy/location (including street and city if local pharmacy) is medication to be sent to?  [id number] - [place], [place] - [address] AT [address]   3. Do they need a 30 day or 90 day supply?   90 day  Patient stated she still has some of this medication left.

## 2023-03-27 ENCOUNTER — Other Ambulatory Visit: Payer: Self-pay

## 2023-03-28 ENCOUNTER — Telehealth: Payer: Self-pay | Admitting: *Deleted

## 2023-03-28 NOTE — Telephone Encounter (Signed)
Patient with diagnosis of A Fib on warfarin for anticoagulation.  Has not seen Dr Royann Shivers since 2022.  Procedure:   SCALING AND ROOT PLANNING, DEEP CLEANING  Date of procedure: 04/17/23   CHA2DS2-VASc Score = 6  This indicates a 9.7% annual risk of stroke. The patient's score is based upon: CHF History: 1 HTN History: 1 Diabetes History: 0 Stroke History: 2 Vascular Disease History: 0 Age Score: 1 Gender Score: 1   CrCl 86 mL/min Platelet count 201K  Patient does not require pre-op antibiotics for dental procedure.  Patient has not been bridged before but will route to Dr Royann Shivers for input due to history of CVA.   **This guidance is not considered finalized until pre-operative APP has relayed final recommendations.**

## 2023-03-28 NOTE — Telephone Encounter (Signed)
Pharmacy please advise on holding warfarin prior to scaling and root planing with deep cleaning scheduled for 04/17/2023. Thank you.

## 2023-03-28 NOTE — Telephone Encounter (Signed)
   Pre-operative Risk Assessment    Patient Name: Danielle Mckinney  DOB: 1951-10-12 MRN: [id number]     Request for Surgical Clearance    Procedure:  Dental Extraction - Amount of Teeth to be Pulled:  SCALING AND ROOT PLANNING, DEEP CLEANING   Date of Surgery:  Clearance 04/17/23                                 Surgeon:  Kathleen Argue, DDS Surgeon's Group or Practice Name:  [facility] Phone number:  [phone] Fax number:  [phone]   Type of Clearance Requested:   - Pharmacy:  Hold Warfarin (Coumadin) THEIR QUESTION IS THAT DEEP CLEANING WILL INCREASE BLEEDING RISK AND WANTING THE RECOMMENDATIONS FOR IT AND TO SEE IF PT IS DUE ANY LAB WORK   Type of Anesthesia:  Not Indicated   Additional requests/questions:    Danielle Mckinney   03/28/2023, 11:29 AM

## 2023-03-29 NOTE — Telephone Encounter (Signed)
     Primary Cardiologist: Thurmon Fair, MD  Chart reviewed as part of pre-operative protocol coverage by clinical pharmacist.  The following recommendations were provided.    Procedure:   SCALING AND ROOT PLANNING, DEEP CLEANING  Date of procedure: 04/17/23     CHA2DS2-VASc Score = 6  This indicates a 9.7% annual risk of stroke. The patient's score is based upon: CHF History: 1 HTN History: 1 Diabetes History: 0 Stroke History: 2 Vascular Disease History: 0 Age Score: 1 Gender Score: 1     CrCl 86 mL/min Platelet count 201K   Patient does not require pre-op antibiotics for dental procedure.  Her warfarin may be held for 5 days before the procedure.  Please resume warfarin as soon as possible afterwards.  I will route this recommendation to the requesting party via Epic fax function and remove from pre-op pool.  Please call with questions.  Thomasene Ripple. Daiel Strohecker NP-C     03/29/2023, 2:58 PM [facility] [address] Office [phone] Fax [phone]

## 2023-03-29 NOTE — Telephone Encounter (Signed)
Past had a stroke related to LV dysfunction atrial fibrillation over 15 years ago.  Since then her left ventricular systolic function has returned to normal and she has not had any atrial fibrillation detected in the last 3 years.  Therefore, even if she has a history of stroke I think it is okay for her to interrupt her anticoagulant for the dental procedure.  Agree that she does not require antibiotics.  Okay to stop warfarin 5 days before the procedure and resume as soon as possible afterwards.

## 2023-03-30 NOTE — Progress Notes (Signed)
Remote ICD transmission.   

## 2023-04-02 ENCOUNTER — Ambulatory Visit: Payer: Medicare Other | Attending: Cardiology

## 2023-04-02 DIAGNOSIS — I480 Paroxysmal atrial fibrillation: Secondary | ICD-10-CM | POA: Diagnosis not present

## 2023-04-02 DIAGNOSIS — Z7901 Long term (current) use of anticoagulants: Secondary | ICD-10-CM

## 2023-04-02 LAB — POCT INR: INR: 2.7 (ref 2.0–3.0)

## 2023-04-02 NOTE — Patient Instructions (Signed)
continue taking warfarin 1 tablet daily except for 1.5 tablets on Sunday and Thursday. Recheck INR in 3 weeks. Dental work 6/18; HOLDING COUMADIN 6/13-6/17 [facility] [phone]

## 2023-04-17 ENCOUNTER — Other Ambulatory Visit: Payer: Self-pay | Admitting: Cardiovascular Disease

## 2023-04-17 DIAGNOSIS — I4891 Unspecified atrial fibrillation: Secondary | ICD-10-CM

## 2023-04-23 ENCOUNTER — Ambulatory Visit: Payer: Medicare Other | Attending: Internal Medicine

## 2023-04-23 ENCOUNTER — Telehealth: Payer: Self-pay | Admitting: Internal Medicine

## 2023-04-23 ENCOUNTER — Telehealth: Payer: Self-pay

## 2023-04-23 DIAGNOSIS — I5032 Chronic diastolic (congestive) heart failure: Secondary | ICD-10-CM

## 2023-04-23 DIAGNOSIS — Z95810 Presence of automatic (implantable) cardiac defibrillator: Secondary | ICD-10-CM | POA: Diagnosis not present

## 2023-04-23 NOTE — Telephone Encounter (Signed)
I spoke with patient who's dental work got rescheduled to 6/27.  She has been Holding Coumadin since 6/22.  Her INR check is 7/2 and I advised her to Hold off on all greens until INR check 7/2.  She verbalized understanding.

## 2023-04-23 NOTE — Telephone Encounter (Signed)
Pt c/o medication issue:  1. Name of Medication:  Coumadin   2. How are you currently taking this medication (dosage and times per day)?   3. Are you having a reaction (difficulty breathing--STAT)?   4. What is your medication issue?   Patient is having dental work done. She states she has been off her Coumadin since Saturday and goes back on it Thursday. She would like to know if she should be eating more greens. Please advise.

## 2023-04-24 ENCOUNTER — Ambulatory Visit: Payer: Medicare Other

## 2023-04-27 ENCOUNTER — Telehealth: Payer: Self-pay

## 2023-04-27 NOTE — Telephone Encounter (Signed)
Remote ICM transmission received.  Attempted call to patient regarding ICM remote transmission and left detailed message per DPR.  Left ICM phone number and advised to return call for any fluid symptoms or questions. Next ICM remote transmission scheduled 05/28/2023.    

## 2023-04-27 NOTE — Progress Notes (Signed)
EPIC Encounter for ICM Monitoring  Patient Name: Danielle Mckinney is a 72 y.o. female Date: 04/27/2023 Primary Care Physican: Shon Hale, MD Primary Cardiologist: Croitoru Electrophysiologist: Rae Roam Pacing:  >99%   07/20/2022 Office Weight: 183 lbs   AT/AF Burden: 0%    Attempted call to patient and unable to reach.  Left detailed message per DPR regarding transmission. Transmission reviewed.    Corvue Thoracic impedance suggesting normal fluid levels within the last month.   Prescribed:  Furosemide 40 mg Take 40 mg by mouth daily as needed for edema.  Takes a few days a month.  Potassium 20 mEq Take 20 mEq by mouth daily as needed (when taking furosemide).  Spironolactone 25 mg take 1 tablet daily Warfarin as directed   Labs: 07/18/2022 Creatinine 0.81, BUN 16, Potassium 4.1, Sodium 139, GFR 79 A complete set of results can be found in Results Review.   Recommendations:  Left voice mail with ICM number and encouraged to call if experiencing any fluid symptoms.   Follow-up plan: ICM clinic phone appointment on 05/28/2023.   91 day device clinic remote transmission 06/12/2023.     EP/Cardiology Office Visits:    05/30/2022 with Dr Crotioru.  Recall 07/28/2023 with Dr Ladona Ridgel.   Copy of ICM check sent to Dr. Ladona Ridgel.     3 month ICM trend: 04/23/2023.    12-14 Month ICM trend:     Karie Soda, RN 04/27/2023 3:19 PM

## 2023-05-01 ENCOUNTER — Ambulatory Visit: Payer: Medicare Other

## 2023-05-10 ENCOUNTER — Ambulatory Visit: Payer: Medicare Other | Attending: Cardiology

## 2023-05-10 DIAGNOSIS — Z7901 Long term (current) use of anticoagulants: Secondary | ICD-10-CM | POA: Diagnosis not present

## 2023-05-10 DIAGNOSIS — I480 Paroxysmal atrial fibrillation: Secondary | ICD-10-CM

## 2023-05-10 DIAGNOSIS — I639 Cerebral infarction, unspecified: Secondary | ICD-10-CM | POA: Diagnosis not present

## 2023-05-10 LAB — POCT INR: INR: 2.1 (ref 2.0–3.0)

## 2023-05-10 NOTE — Patient Instructions (Signed)
Description   Continue taking warfarin 1 tablet daily except for 1.5 tablets on Sundays and Thursdays. Recheck INR in 4 weeks. [facility]in [facility] [phone]

## 2023-05-16 ENCOUNTER — Other Ambulatory Visit: Payer: Self-pay

## 2023-05-16 MED ORDER — SPIRONOLACTONE 25 MG PO TABS: ORAL_TABLET | ORAL | 0 refills | Status: DC

## 2023-05-28 ENCOUNTER — Telehealth: Payer: Self-pay

## 2023-05-28 ENCOUNTER — Ambulatory Visit: Payer: Medicare Other

## 2023-05-28 DIAGNOSIS — Z95810 Presence of automatic (implantable) cardiac defibrillator: Secondary | ICD-10-CM | POA: Diagnosis not present

## 2023-05-28 DIAGNOSIS — I5032 Chronic diastolic (congestive) heart failure: Secondary | ICD-10-CM | POA: Diagnosis not present

## 2023-05-28 NOTE — Progress Notes (Signed)
EPIC Encounter for ICM Monitoring  Patient Name: Danielle Mckinney is a 72 y.o. female Date: 05/28/2023 Primary Care Physican: Shon Hale, MD Primary Cardiologist: Croitoru Electrophysiologist: Rae Roam Pacing:  >99%   07/20/2022 Office Weight: 183 lbs   AT/AF Burden: 0%    Attempted call to patient and unable to reach.  Left detailed message per DPR regarding transmission. Transmission reviewed.    Corvue Thoracic impedance suggesting normal fluid levels with the exception of possible fluid accumulation starting 7/27 and returned close to baseline 7/29.   Prescribed:  Furosemide 40 mg Take 40 mg by mouth daily as needed for edema.  Takes a few days a month.  Potassium 20 mEq Take 20 mEq by mouth daily as needed (when taking furosemide).  Spironolactone 25 mg take 1 tablet daily Warfarin as directed   Labs: 07/18/2022 Creatinine 0.81, BUN 16, Potassium 4.1, Sodium 139, GFR 79 A complete set of results can be found in Results Review.   Recommendations:  Left voice mail with ICM number and encouraged to call if experiencing any fluid symptoms.   Follow-up plan: ICM clinic phone appointment on 07/03/2023.   91 day device clinic remote transmission 06/12/2023.     EP/Cardiology Office Visits:    05/30/2022 with Dr Crotioru.  Recall 07/28/2023 with Dr Ladona Ridgel.   Copy of ICM check sent to Dr. Ladona Ridgel.     3 month ICM trend: 05/28/2023.    12-14 Month ICM trend:     Karie Soda, RN 05/28/2023 1:50 PM

## 2023-05-28 NOTE — Telephone Encounter (Signed)
Remote ICM transmission received.  Attempted call to patient regarding ICM remote transmission and left detailed message per DPR with ICM phone number to return call.  Left ICM phone number and advised to return call for any fluid symptoms or questions. Next ICM remote transmission scheduled 07/03/2023.

## 2023-05-31 ENCOUNTER — Ambulatory Visit (INDEPENDENT_AMBULATORY_CARE_PROVIDER_SITE_OTHER): Payer: Medicare Other

## 2023-05-31 ENCOUNTER — Encounter: Payer: Self-pay | Admitting: Cardiovascular Disease

## 2023-05-31 ENCOUNTER — Ambulatory Visit: Payer: Medicare Other | Attending: Cardiovascular Disease | Admitting: Cardiovascular Disease

## 2023-05-31 VITALS — BP 128/64 | HR 68 | Ht 62.0 in | Wt 190.4 lb

## 2023-05-31 DIAGNOSIS — R7303 Prediabetes: Secondary | ICD-10-CM

## 2023-05-31 DIAGNOSIS — I4891 Unspecified atrial fibrillation: Secondary | ICD-10-CM | POA: Diagnosis not present

## 2023-05-31 DIAGNOSIS — D6869 Other thrombophilia: Secondary | ICD-10-CM | POA: Diagnosis not present

## 2023-05-31 DIAGNOSIS — Z45018 Encounter for adjustment and management of other part of cardiac pacemaker: Secondary | ICD-10-CM | POA: Diagnosis not present

## 2023-05-31 DIAGNOSIS — I480 Paroxysmal atrial fibrillation: Secondary | ICD-10-CM | POA: Diagnosis not present

## 2023-05-31 DIAGNOSIS — E668 Other obesity: Secondary | ICD-10-CM

## 2023-05-31 DIAGNOSIS — I5032 Chronic diastolic (congestive) heart failure: Secondary | ICD-10-CM

## 2023-05-31 DIAGNOSIS — I10 Essential (primary) hypertension: Secondary | ICD-10-CM

## 2023-05-31 LAB — POCT INR: INR: 1.8 — AB (ref 2.0–3.0)

## 2023-05-31 MED ORDER — CARVEDILOL 6.25 MG PO TABS: 9.3750 mg | ORAL_TABLET | Freq: Two times a day (BID) | ORAL | 3 refills | Status: DC

## 2023-05-31 MED ORDER — SPIRONOLACTONE 25 MG PO TABS: 25.0000 mg | ORAL_TABLET | Freq: Every day | ORAL | 3 refills | Status: DC

## 2023-05-31 NOTE — Patient Instructions (Signed)
Description   Take 2 tablets today and then continue taking warfarin 1 tablet daily except for 1.5 tablets on Sundays and Thursdays.  Recheck INR in 5 weeks. Coumadin Clinic [phone]

## 2023-05-31 NOTE — Progress Notes (Signed)
Cardiology Office Note    Date:  06/01/2023   ID:  Danielle Mckinney, DOB 01-Jun-1951, MRN [id number]  PCP:  Shon Hale, MD  Cardiologist:  Lewayne Bunting, M.D.; Thurmon Fair, MD   No chief complaint on file.    History of Present Illness:  Danielle Mckinney is a 72 y.o. 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  She continues to do quite well.  She continues to enjoy exercising with line dancing.  She has not had problems with exertional dyspnea, orthopnea, PND, lower extremity edema, palpitations, dizziness, syncope.  She only takes a dose of furosemide to less than once a month when she notices some swelling.  She has not had any problems with dizziness.  No new neurological complaints.  Device interrogation shows normal function.  Estimated longevity is 4.3 years.  Lead parameters are stable.  She only has 5% atrial pacing with a good heart rate histogram distribution and she has virtually 100% effective biventricular pacing.  She had a single 15 beat run of paroxysmal atrial tachycardia but has not had atrial fibrillation or ventricular tachycardia.  Thoracic impedance is consistent with normal fluid levels over the last couple of months. Her RV lead is a Riata ST 7001 implanted in 2008 that is still functioning normally has excellent high-voltage and pace-sense impedances, but which had some problems with noise oversensing so that ventricular sensing is programmed via the LV lead.    Metabolic control is good with an HDL of 50, LDL 59 and hemoglobin A1c of 6.3% (not on medications for glucose control).   Past Medical History:  Diagnosis Date   Atrial fibrillation [facility])    Automatic implantable cardioverter-defibrillator in situ 08/25/2009    Qualifier: Diagnosis of  By: Ladona Ridgel, MD, [facility], Vergia Alcon     Biventricular ICD (implantable cardioverter-defibrillator) in place    [employer]ude   Breast cancer [facility])    1989, 1998   Cancer (HCC)    Breast cancer-BRACA I gene   CHF 08/18/2009   Qualifier: Diagnosis of  By: Oswald Hillock     CHF (congestive heart failure) (HCC)    Chronic diastolic CHF (congestive heart failure) (HCC) 04/23/2012   CIN I (cervical intraepithelial neoplasia I)    CVA (cerebral vascular accident) (HCC) 01/15/2013   Essential hypertension 08/18/2009   Qualifier: Diagnosis of  By: Oswald Hillock     Fibroid    Fibroid uterus 04/15/2013   Genetic testing 07/06/2015   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  The report date is January 21, 1998.    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   Stroke [facility])     Past Surgical History:  Procedure Laterality Date   BIV ICD GENERTAOR CHANGE OUT  12/20/2011   [employer]   BIV ICD GENERTAOR CHANGE OUT N/A 12/20/2011   Procedure: BIV ICD GENERTAOR CHANGE OUT;  Surgeon: Marinus Maw, MD;  Location: [facility] CATH LAB;  Service: Cardiovascular;  Laterality: N/A;   BIV PACEMAKER GENERATOR CHANGEOUT N/A 12/16/2019   Procedure: DOWNGRADE TO BIV PACEMAKER;  Surgeon: Marinus Maw, MD;  Location: [facility] INVASIVE CV LAB;  Service: Cardiovascular;  Laterality: N/A;  BREAST SURGERY     Bilateral mastctomy and reconstruction TRAM   CARDIAC DEFIBRILLATOR PLACEMENT     CERVICAL CONE BIOPSY     COLPOSCOPY     GYNECOLOGIC CRYOSURGERY     MASTECTOMY     OOPHORECTOMY     BSO   TRANSTHORACIC ECHOCARDIOGRAM  08/14/2006   TUBAL LIGATION     US ECHOCARDIOGRAPHY  06/25/2012   borderline concentric LVH,LV systolic fx mildly reduced,impaired LV relaxation    Current Medications: Outpatient Medications  Prior to Visit  Medication Sig Dispense Refill   Calcium-Magnesium-Vitamin D (CALCIUM 1200+D3 PO) Take 1 tablet by mouth daily.      Cholecalciferol 25 MCG (1000 UT) tablet Take 1,000 Units by mouth daily.     Flaxseed, Linseed, (FLAX SEED OIL) 1000 MG CAPS Take 1 capsule by mouth daily at 6 (six) AM.     furosemide (LASIX) 40 MG tablet TAKE 1 TABLET BY MOUTH EVERY DAY AS NEEDED FOR SWELLING 90 tablet 1   Krill Oil 500 MG CAPS Take 500 mg by mouth daily.     losartan (COZAAR) 100 MG tablet Take 0.5 tablets (50 mg total) by mouth 2 (two) times daily. 90 tablet 1   OVER THE COUNTER MEDICATION Take 1 tablet by mouth daily. Olive Leaf and Oregano     Polyethyl Glycol-Propyl Glycol (SYSTANE OP) Place 1 drop into both eyes 2 (two) times daily.     pyridOXINE (VITAMIN B-6) 100 MG tablet Take 100 mg by mouth daily.     rosuvastatin (CRESTOR) 10 MG tablet Take 10 mg by mouth daily.     warfarin (COUMADIN) 4 MG tablet TAKE 1 TO 2 TABLETS BY MOUTH DAILY AS DIRECTED BY COUMADIN CLINIC 100 tablet 0   carvedilol (COREG) 6.25 MG tablet Take 1.5 tablets (9.375 mg total) by mouth 2 (two) times daily with a meal. 90 tablet 0   spironolactone (ALDACTONE) 25 MG tablet TAKE 1 TABLET(25 MG) BY MOUTH DAILY 15 tablet 0   meclizine (ANTIVERT) 25 MG tablet Take 25 mg by mouth 3 (three) times daily as needed for dizziness. (Patient not taking: Reported on 05/31/2023)     potassium chloride SA (KLOR-CON) 20 MEQ tablet TAKE 1 TABLET BY MOUTH DAILY IF NEEDED(TAKE ALONG WITH FUROSEMIDE WHEN NEEDED) (Patient not taking: Reported on 05/31/2023) 30 tablet 3   valACYclovir (VALTREX) 500 MG tablet Take 500 mg by mouth as needed. (Patient not taking: Reported on 05/31/2023)     No facility-administered medications prior to visit.     Allergies:   Codeine, Lisinopril, Oxycodone, Penicillins, and Sulfa antibiotics   Social History   Socioeconomic History   Marital status: Divorced    Spouse name: Not on file   Number of children:  Not on file   Years of education: Not on file   Highest education level: Not on file  Occupational History   Not on file  Tobacco Use   Smoking status: Never   Smokeless tobacco: Never  Vaping Use   Vaping status: Never Used  Substance and Sexual Activity   Alcohol use: No    Alcohol/week: 0.0 standard drinks of alcohol   Drug use: No   Sexual activity: Yes    Birth control/protection: Post-menopausal    Comment: younger than 17, 5 partners, ovaries removed  Other Topics Concern   Not on file  Social History Narrative   Not on file   Social Determinants of Health   Financial Resource Strain: Not on file  Food Insecurity: Not  on file  Transportation Needs: Not on file  Physical Activity: Not on file  Stress: Not on file  Social Connections: Not on file     Family History:  The patient's family history includes Breast cancer in her sister; Cirrhosis in her father; Clotting disorder in her mother; Coronary artery disease in an other family member; Heart disease in her maternal grandfather; Hypertension in her brother and sister.   ROS:   Please see the history of present illness.   All other systems are reviewed and are negative.   PHYSICAL EXAM:   VS:  BP 128/64 (BP Location: Left Arm, Patient Position: Sitting, Cuff Size: Large)   Pulse 68   Ht 5\' 2"  (1.575 m)   Wt 190 lb 6.4 oz (86.4 kg)   SpO2 92%   BMI 34.82 kg/m      General: Alert, oriented x3, no distress, moderately obese, healthy left subclavian ICD site Head: no evidence of trauma, PERRL, EOMI, no exophtalmos or lid lag, no myxedema, no xanthelasma; normal ears, nose and oropharynx Neck: normal jugular venous pulsations and no hepatojugular reflux; brisk carotid pulses without delay and no carotid bruits Chest: clear to auscultation, no signs of consolidation by percussion or palpation, normal fremitus, symmetrical and full respiratory excursions Cardiovascular: normal position and quality of the apical  impulse, regular rhythm, normal first and second heart sounds, no murmurs, rubs or gallops Abdomen: no tenderness or distention, no masses by palpation, no abnormal pulsatility or arterial bruits, normal bowel sounds, no hepatosplenomegaly Extremities: no clubbing, cyanosis or edema; 2+ radial, ulnar and brachial pulses bilaterally; 2+ right femoral, posterior tibial and dorsalis pedis pulses; 2+ left femoral, posterior tibial and dorsalis pedis pulses; no subclavian or femoral bruits Neurological: grossly nonfocal Psych: Normal mood and affect    Wt Readings from Last 3 Encounters:  05/31/23 190 lb 6.4 oz (86.4 kg)  10/12/22 183 lb (83 kg)  07/20/22 183 lb 12.8 oz (83.4 kg)      Studies/Labs Reviewed:   ECHO 12/09/2019: Study Result   1. Left ventricular ejection fraction, by estimation, is 55 to 60%. The left ventricle has normal function. The left ventrical has no regional wall motion abnormalities. Left ventricular diastolic parameters are  consistent with Grade I diastolic dysfunction (impaired relaxation). Elevated left ventricular pressure.   2. Right ventricular systolic function is normal. The right ventricular size is normal. There is mildly elevated pulmonary artery systolic pressure.   3. Left atrial size was mildly dilated.   4. Trivial mitral valve regurgitation.   5. The aortic valve is normal in structure and function. Aortic valve regurgitation is not visualized. No aortic stenosis is present.   6. The inferior vena cava is normal in size with greater than 50% respiratory variability, suggesting right atrial pressure of 3 mmHg.    EKG:  EKG is ordered today.  It shows atrial sensed biventricular paced rhythm with initial sharp R wave in leads V1-V2 and a narrow QRS 124 ms.  QTc 459 ms  Recent Labs: 07/18/2022: BUN 16; Creatinine, Ser 0.81; Hemoglobin 11.6; Platelets 201; Potassium 4.1; Sodium 139    ASSESSMENT:    1. Chronic diastolic CHF (congestive heart failure)  (HCC)   2. Biventricular pacemaker check   3. Paroxysmal atrial fibrillation (HCC)   4. Acquired thrombophilia (HCC)   5. Essential hypertension   6. Prediabetes   7. Moderate obesity      PLAN:  In order of problems listed above:  CHF: NYHA class I,  euvolemic with rare use of loop diuretics, on carvedilol/ARB/spironolactone.  Excellent recovery in LV systolic function.  We have discussed using SGLT2 inhibitors, but she prefers not to start a new medication. CRT-P: s/p generator change out, downgrade from ICD to CRT-P. Some chronic noise oversensing on the RV channel, now programmed to sense via LV lead.  No episodes of VT recorded. AFib: Although she has a remote history of embolic stroke, its been over 3 years since we last saw atrial fibrillation on her device checks.  None has been seen on the current generator since change out last year.  CHADSVasc 5 (gender, hypertension, heart failure, previous stroke).   Warfarin: No bleeding problems.  Slightly subtherapeutic today, for the first time in 6 months.  Usually well-controlled. HTN: Excellent blood pressure control. PreDM: Not quite meeting criteria for diabetes mellitus.  Discussed eating a healthy diet, reviewed the concept of the "glycemic index".  If she does require medications for glucose control I would strongly recommend using an SGLT2 inhibitor such as Jardiance or Farxiga for the added benefit for her chronic heart disease problems. Obesity: Remains moderately obese.  Weight loss would be beneficial to prevent worsening glucose control and improve long-term outcomes.   Medication Adjustments/Labs and Tests Ordered: Current medicines are reviewed at length with the patient today.  Concerns regarding medicines are outlined above.  Medication changes, Labs and Tests ordered today are listed in the Patient Instructions below. Patient Instructions  Medication Instructions:  No changes *If you need a refill on your cardiac  medications before your next appointment, please call your pharmacy*  Follow-Up: At [facility], you and your health needs are our priority.  As part of our continuing mission to provide you with exceptional heart care, we have created designated Provider Care Teams.  These Care Teams include your primary Cardiologist (physician) and Advanced Practice Providers (APPs -  Physician Assistants and Nurse Practitioners) who all work together to provide you with the care you need, when you need it.  We recommend signing up for the patient portal called "MyChart".  Sign up information is provided on this After Visit Summary.  MyChart is used to connect with patients for Virtual Visits (Telemedicine).  Patients are able to view lab/test results, encounter notes, upcoming appointments, etc.  Non-urgent messages can be sent to your provider as well.   To learn more about what you can do with MyChart, go to [email or web address].    Your next appointment:   1 year(s)  Provider:   Thurmon Fair, MD       Signed, Thurmon Fair, MD  06/01/2023 9:09 AM    [facility] [address] [place], [place], [place]  [address] Phone: [phone]; Fax: [phone]

## 2023-05-31 NOTE — Patient Instructions (Signed)

## 2023-06-04 NOTE — Therapy (Signed)
OUTPATIENT PHYSICAL THERAPY  UPPER EXTREMITY ONCOLOGY EVALUATION  Patient Name: Danielle Mckinney MRN: [id number] DOB:June 11, 1951, 72 y.o., female Today's Date: 06/06/2023  END OF SESSION:  PT End of Session - 06/06/23 1502     Visit Number 1    Number of Visits 25    Date for PT Re-Evaluation 09/10/23    Authorization Type None    PT Start Time 1500    PT Stop Time 1555    PT Time Calculation (min) 55 min    Activity Tolerance Patient tolerated treatment well    Behavior During Therapy [facility] for tasks assessed/performed             Past Medical History:  Diagnosis Date   Atrial fibrillation (HCC)    Automatic implantable cardioverter-defibrillator in situ 08/25/2009   Qualifier: Diagnosis of  By: Ladona Ridgel, MD, [facility], Vergia Alcon     Biventricular ICD (implantable cardioverter-defibrillator) in place    [employer]ude   Breast cancer (HCC)    1989, 1998   Cancer (HCC)    Breast cancer-BRACA I gene   CHF 08/18/2009   Qualifier: Diagnosis of  By: Oswald Hillock     CHF (congestive heart failure) (HCC)    Chronic diastolic CHF (congestive heart failure) (HCC) 04/23/2012   CIN I (cervical intraepithelial neoplasia I)    CVA (cerebral vascular accident) (HCC) 01/15/2013   Essential hypertension 08/18/2009   Qualifier: Diagnosis of  By: Oswald Hillock     Fibroid    Fibroid uterus 04/15/2013   Genetic testing 07/06/2015   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  The report date is January 21, 1998.    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   Stroke [facility])    Past Surgical History:  Procedure Laterality Date   BIV ICD GENERTAOR CHANGE OUT  12/20/2011   [employer]   BIV ICD GENERTAOR CHANGE OUT N/A 12/20/2011   Procedure: BIV ICD GENERTAOR CHANGE OUT;  Surgeon: Marinus Maw, MD;  Location: [facility] CATH  LAB;  Service: Cardiovascular;  Laterality: N/A;   BIV PACEMAKER GENERATOR CHANGEOUT N/A 12/16/2019   Procedure: DOWNGRADE TO BIV PACEMAKER;  Surgeon: Marinus Maw, MD;  Location: [facility] INVASIVE CV LAB;  Service: Cardiovascular;  Laterality: N/A;   BREAST SURGERY     Bilateral mastctomy and reconstruction TRAM   CARDIAC DEFIBRILLATOR PLACEMENT     CERVICAL CONE BIOPSY     COLPOSCOPY     GYNECOLOGIC CRYOSURGERY     MASTECTOMY     OOPHORECTOMY     BSO   TRANSTHORACIC ECHOCARDIOGRAM  08/14/2006   TUBAL LIGATION     US ECHOCARDIOGRAPHY  06/25/2012   borderline concentric LVH,LV systolic fx mildly reduced,impaired LV relaxation   Patient Active Problem List   Diagnosis Date Noted   Nonischemic cardiomyopathy (HCC) 07/13/2015   Genetic testing 07/06/2015   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  (Chart  review shows she noted arm swelling in 2019 in a note with Lillard Anes)   PERTINENT HISTORY: *Pacemaker, Bil mastectomy and reconstruction with TRAM in 2000 with hx of bilateral cancers. Treated aggressively with chemo and SCT in 1998 due to left breast cancer leading to heart issues which are mostly resolved, CHF, Afib, CVA.  Decided to "graduate" from oncology care when Dr. Darnelle Catalan retired. Hx of Rt breast cancer with radiation and SLNB but unsure how many were taken.    PAIN:  Are you having pain? No  PRECAUTIONS: Pacemaker  RED FLAGS: None   WEIGHT BEARING RESTRICTIONS: No  FALLS:  Has patient fallen in last 6 months? No  LIVING ENVIRONMENT: Lives with: lives alone  OCCUPATION: Retired after CVA   LEISURE: line dance, bowling   HAND DOMINANCE: right   PRIOR LEVEL OF FUNCTION: Independent  PATIENT GOALS: what to do for the swelling   OBJECTIVE:  COGNITION: Overall cognitive status: Within functional limits for tasks assessed   PALPATION: Non pitting Lt UE lymphedema   OBSERVATIONS / OTHER ASSESSMENTS:  Skin darkening on the Left chest which was suspected to be from radiation but pt noted it started after her pacemeaker was inserted and she reports cardiology is aware of it and thinks it may be from chronic inflammation.  A note was sent to cardiology to verify that this is the case.     POSTURE: rounded shoulders   UPPER EXTREMITY AROM/PROM:  A/PROM LEFT Eval - just feels heavy    Shoulder extension   Shoulder flexion 120  Shoulder abduction 128  Shoulder internal rotation   Shoulder external rotation     (Blank rows = not tested)  A/PROM RIGHT eval  Shoulder extension   Shoulder flexion 135  Shoulder abduction 155  Shoulder internal rotation   Shoulder external rotation     (Blank rows = not tested)  LYMPHEDEMA ASSESSMENTS:   LANDMARK RIGHT  eval  At axilla    15 cm proximal to  olecranon process 38.5  10 cm proximal to olecranon process  35.8  Olecranon process 30.0  15 cm proximal to ulnar styloid process 30.3  10 cm proximal to ulnar styloid process 27.3  Just proximal to ulnar styloid process 19.5  Across hand at thumb web space 21  At base of 2nd digit 7.1  (Blank rows = not tested)  LANDMARK LEFT  eval  At axilla    15 cm proximal to olecranon process 43  10 cm proximal to olecranon process 42.5  Olecranon process 34.5  15 cm proximal to ulnar styloid process 33.3  10 cm proximal to ulnar styloid process 29.8  Just proximal to ulnar styloid process 20.0  Across hand at thumb web space 21.1  At base of 2nd digit 7.1  Rt: Lt:  difference in volume   (Blank rows = not tested)  TODAY'S TREATMENT:                                                                                                                                          DATE: 06/05/23 Eval performed  Discussed all CDT options including bandaging, velcro, and obtaining garments Pt hesitant to start right away so she will schedule for mid September with bandaging attempts.    PATIENT EDUCATION:  Education details: CDT options Person educated: Patient Education method: Explanation Education comprehension: verbalized understanding  HOME EXERCISE PROGRAM: none  ASSESSMENT:  CLINICAL IMPRESSION: Patient is a 72 y.o. female who was seen today for physical therapy evaluation and treatment for her chronic Lt UE lymphedema.  She noted swelling in 2019 but that this may have been going on for awhile.  Her Left UE demonstrates stage 3 lymphedema that is not pitting and demonstrates fibrotic changes and adipose thickening.  She also has decrease AROM due to the heaviness of her arm. The skin on the left chest is darkened and pt notes this is from the pacemaker and a note was sent to verify this.  Discussed CDT options and pt will attempt bandaging but may not tolerate it or want to keep doing it so we will switch to either velcro or flat  knit as needed.    OBJECTIVE IMPAIRMENTS: decreased ROM, decreased strength, increased edema, and impaired UE functional use.   ACTIVITY LIMITATIONS: lifting  PARTICIPATION LIMITATIONS: cleaning and community activity  PERSONAL FACTORS: Age, Fitness, Time since onset of injury/illness/exacerbation, and 1-2 comorbidities: radiation hx, breast cancer hx  are also affecting patient's functional outcome.   REHAB POTENTIAL: Good  CLINICAL DECISION MAKING: Evolving/moderate complexity  EVALUATION COMPLEXITY: Moderate  GOALS: Goals reviewed with patient? Yes  SHORT TERM GOALS=LTGs: Target date: 09/10/23  Pt will obtain day and night compression after maximal reduction to control lymphedema  Baseline: Goal status: INITIAL  2.  Pt will be ind with self MLD or use of a compression pump  Baseline:  Goal status:  INITIAL  3.  Pt will be ind with stretches for shoulder ROM and mobility  Baseline:  Goal status: INITIAL  PLAN:  PT FREQUENCY: 3x/week  PT DURATION: 8 weeks  PLANNED INTERVENTIONS: Therapeutic exercises, Therapeutic activity, Neuromuscular re-education, Balance training, Gait training, Patient/Family education, Self Care, and Joint mobilization  PLAN FOR NEXT SESSION: Lt UE CDT - pt hesitant about bandaging but wants to try,  cardiology note back about pacemaker skin changes?  Idamae Lusher, PT 06/06/2023, 3:06 PM

## 2023-06-05 ENCOUNTER — Encounter: Payer: Self-pay | Admitting: Rehabilitation

## 2023-06-05 ENCOUNTER — Other Ambulatory Visit: Payer: Self-pay

## 2023-06-05 ENCOUNTER — Ambulatory Visit: Payer: Medicare Other | Attending: Family Medicine | Admitting: Rehabilitation

## 2023-06-05 DIAGNOSIS — Z1501 Genetic susceptibility to malignant neoplasm of breast: Secondary | ICD-10-CM | POA: Insufficient documentation

## 2023-06-05 DIAGNOSIS — C50912 Malignant neoplasm of unspecified site of left female breast: Secondary | ICD-10-CM | POA: Diagnosis present

## 2023-06-05 DIAGNOSIS — I972 Postmastectomy lymphedema syndrome: Secondary | ICD-10-CM | POA: Diagnosis present

## 2023-06-12 ENCOUNTER — Ambulatory Visit (INDEPENDENT_AMBULATORY_CARE_PROVIDER_SITE_OTHER): Payer: Medicare Other

## 2023-06-12 DIAGNOSIS — I428 Other cardiomyopathies: Secondary | ICD-10-CM

## 2023-06-12 DIAGNOSIS — I5032 Chronic diastolic (congestive) heart failure: Secondary | ICD-10-CM | POA: Diagnosis not present

## 2023-06-12 LAB — CUP PACEART REMOTE DEVICE CHECK
Battery Remaining Longevity: 52 mo
Battery Remaining Percentage: 53 %
Battery Voltage: 2.99 V
Brady Statistic AP VP Percent: 9.9 %
Brady Statistic AP VS Percent: 1 %
Brady Statistic AS VP Percent: 90 %
Brady Statistic AS VS Percent: 1 %
Brady Statistic RA Percent Paced: 9.7 %
Date Time Interrogation Session: 20240813020046
Implantable Lead Connection Status: 753985
Implantable Lead Connection Status: 753985
Implantable Lead Connection Status: 753985
Implantable Lead Implant Date: 20080307
Implantable Lead Implant Date: 20080307
Implantable Lead Implant Date: 20080307
Implantable Lead Location: 753858
Implantable Lead Location: 753859
Implantable Lead Location: 753860
Implantable Lead Model: 7001
Implantable Pulse Generator Implant Date: 20210216
Lead Channel Impedance Value: 340 Ohm
Lead Channel Impedance Value: 590 Ohm
Lead Channel Impedance Value: 780 Ohm
Lead Channel Pacing Threshold Amplitude: 0.5 V
Lead Channel Pacing Threshold Amplitude: 0.75 V
Lead Channel Pacing Threshold Amplitude: 0.75 V
Lead Channel Pacing Threshold Pulse Width: 0.5 ms
Lead Channel Pacing Threshold Pulse Width: 0.5 ms
Lead Channel Pacing Threshold Pulse Width: 0.8 ms
Lead Channel Sensing Intrinsic Amplitude: 12 mV
Lead Channel Sensing Intrinsic Amplitude: 5 mV
Lead Channel Setting Pacing Amplitude: 1.5 V
Lead Channel Setting Pacing Amplitude: 1.75 V
Lead Channel Setting Pacing Amplitude: 2 V
Lead Channel Setting Pacing Pulse Width: 0.5 ms
Lead Channel Setting Pacing Pulse Width: 0.8 ms
Lead Channel Setting Sensing Sensitivity: 2 mV
Pulse Gen Model: 3222
Pulse Gen Serial Number: 9105249

## 2023-06-21 ENCOUNTER — Encounter: Payer: Self-pay | Admitting: Dermatology

## 2023-06-21 ENCOUNTER — Ambulatory Visit (INDEPENDENT_AMBULATORY_CARE_PROVIDER_SITE_OTHER): Payer: Medicare Other | Admitting: Dermatology

## 2023-06-21 VITALS — BP 138/90 | HR 63

## 2023-06-21 DIAGNOSIS — D225 Melanocytic nevi of trunk: Secondary | ICD-10-CM

## 2023-06-21 DIAGNOSIS — D239 Other benign neoplasm of skin, unspecified: Secondary | ICD-10-CM

## 2023-06-21 DIAGNOSIS — L814 Other melanin hyperpigmentation: Secondary | ICD-10-CM | POA: Diagnosis not present

## 2023-06-21 DIAGNOSIS — D3613 Benign neoplasm of peripheral nerves and autonomic nervous system of lower limb, including hip: Secondary | ICD-10-CM | POA: Diagnosis not present

## 2023-06-21 NOTE — Progress Notes (Signed)
   New Patient Visit   Subjective  Danielle Mckinney is a 72 y.o. female who presents for the following: Nevus  Patient states she has Nevus located at the chest that she would like to have examined. Patient reports the areas have been there for  Her entire life . She reports the areas are not bothersome. She states that the areas has gotten darker about within the past year. Patient reports has not previously been treated for these areas. Patient denies Hx of bx. Patient denies family history of skin cancer(s).    The following portions of the chart were reviewed this encounter and updated as appropriate: medications, allergies, medical history  Review of Systems:  No other skin or systemic complaints except as noted in HPI or Assessment and Plan.  Objective  Well appearing patient in no apparent distress; mood and affect are within normal limits.  A focused examination was performed of the following areas: Chest  Relevant exam findings are noted in the Assessment and Plan.        Assessment & Plan   1. 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 The nevus, 4mm in size with a brown papule in the center, has darkened over the past 2-3 years. Examination suggests the nevus is benign. - Plan: Reassure the patient about the normal appearance of the nevus. Recommend photographing the nevus for reference and monitoring any changes. Schedule a follow-up in one year or sooner if changes occur. Advise applying sunscreen to the face, neck, and chest to prevent further darkening from sun exposure.  2. Solar Lentigo - Assessment: A flat brown area surrounding the epidermal nevus, likely a solar lentigo due to sun exposure. - Plan: No immediate intervention required. Educate the patient on the importance of sun protection and provide sunscreen samples for trial.  3. Neurofibroma - Assessment: A soft,  fleshy papule on the left forearm identified as a neurofibroma. The patient does not have multiple neurofibromas. - Plan: Reassure the patient that having one or two neurofibromas is normal and not concerning. No further intervention needed at this time.  4. Skin Care - Assessment: The patient's skin is in good condition overall. - Plan: Provide a sample bag with various skin care products, including face wash and moisturizer, for trial. Encourage ongoing skin care and contact with the clinic for any changes or concerns.        No follow-ups on file.  Documentation: I have reviewed the above documentation for accuracy and completeness, and I agree with the above.  Stasia Cavalier, am acting as scribe for Langston Reusing, DO.  Langston Reusing, DO

## 2023-06-21 NOTE — Patient Instructions (Addendum)
Hello Ms. Buzzell,  Thank you for visiting our clinic today. Your dedication to maintaining your skin health is greatly appreciated.  Here is a summary of the essential instructions from today's consultation:  - Observations: The examination of your epidermal nevus and solar lentigo on your chest showed both to be benign.  - Biopsy: We have considered the option of a biopsy for the epidermal nevus should it continue to darken or change, to confirm its benign nature.  - Skin Care Samples: A sample bag with various skin care products, including face wash and moisturizer, was provided to support your skin care routine.  - Sun Protection: The importance of applying sunscreen to the face, neck, and chest was emphasized, particularly when driving, to protect against sun exposure that can affect skin conditions.  - Monitoring: It is recommended to photograph the epidermal nevus to monitor any changes in size or color. Its current measurement is four millimeters.  - Follow-Up: A follow-up visit in one year is recommended to reassess the nevus, or sooner if any changes are observed.  We are here to support you in taking excellent care of your skin. Should you have any concerns or notice any changes, please do not hesitate to reach out to [place].  Warm regards,  Dr. Langston Reusing [facility]     Important Information  Due to recent changes in healthcare laws, you may see results of your pathology and/or laboratory studies on MyChart before the doctors have had a chance to review them. We understand that in some cases there may be results that are confusing or concerning to you. Please understand that not all results are received at the same time and often the doctors may need to interpret multiple results in order to provide you with the best plan of care or course of treatment. Therefore, we ask that you please give [place] 2 business days to thoroughly review all your results before contacting the office  for clarification. Should we see a critical lab result, you will be contacted sooner.   If You Need Anything After Your Visit  If you have any questions or concerns for your doctor, please call our main line at [phone] If no one answers, please leave a voicemail as directed and we will return your call as soon as possible. Messages left after 4 pm will be answered the following business day.   You may also send [place] a message via MyChart. We typically respond to MyChart messages within 1-2 business days.  For prescription refills, please ask your pharmacy to contact our office. Our fax number is [phone].  If you have an urgent issue when the clinic is closed that cannot wait until the next business day, you can page your doctor at the number below.    Please note that while we do our best to be available for urgent issues outside of office hours, we are not available 24/7.   If you have an urgent issue and are unable to reach [place], you may choose to seek medical care at your doctor's office, retail clinic, urgent care center, or emergency room.  If you have a medical emergency, please immediately call 911 or go to the emergency department. In the event of inclement weather, please call our main line at [phone] for an update on the status of any delays or closures.  Dermatology Medication Tips: Please keep the boxes that topical medications come in in order to help keep track of the instructions about where and  how to use these. Pharmacies typically print the medication instructions only on the boxes and not directly on the medication tubes.   If your medication is too expensive, please contact our office at [phone] or send [place] a message through MyChart.   We are unable to tell what your co-pay for medications will be in advance as this is different depending on your insurance coverage. However, we may be able to find a substitute medication at lower cost or fill out paperwork  to get insurance to cover a needed medication.   If a prior authorization is required to get your medication covered by your insurance company, please allow [place] 1-2 business days to complete this process.  Drug prices often vary depending on where the prescription is filled and some pharmacies may offer cheaper prices.  The website www.goodrx.com contains coupons for medications through different pharmacies. The prices here do not account for what the cost may be with help from insurance (it may be cheaper with your insurance), but the website can give you the price if you did not use any insurance.  - You can print the associated coupon and take it with your prescription to the pharmacy.  - You may also stop by our office during regular business hours and pick up a GoodRx coupon card.  - If you need your prescription sent electronically to a different pharmacy, notify our office through [facility] or by phone at [phone]

## 2023-06-27 NOTE — Progress Notes (Signed)
Remote ICD transmission.   

## 2023-07-03 ENCOUNTER — Ambulatory Visit: Payer: Medicare Other | Attending: Internal Medicine

## 2023-07-03 DIAGNOSIS — Z45018 Encounter for adjustment and management of other part of cardiac pacemaker: Secondary | ICD-10-CM

## 2023-07-03 DIAGNOSIS — I5032 Chronic diastolic (congestive) heart failure: Secondary | ICD-10-CM

## 2023-07-05 ENCOUNTER — Ambulatory Visit: Payer: Medicare Other | Attending: Internal Medicine | Admitting: *Deleted

## 2023-07-05 DIAGNOSIS — I480 Paroxysmal atrial fibrillation: Secondary | ICD-10-CM

## 2023-07-05 DIAGNOSIS — Z7901 Long term (current) use of anticoagulants: Secondary | ICD-10-CM

## 2023-07-05 DIAGNOSIS — I639 Cerebral infarction, unspecified: Secondary | ICD-10-CM

## 2023-07-05 LAB — POCT INR: INR: 3.9 — AB (ref 2.0–3.0)

## 2023-07-06 NOTE — Progress Notes (Signed)
EPIC Encounter for ICM Monitoring  Patient Name: Danielle Mckinney is a 72 y.o. female Date: 07/06/2023 Primary Care Physican: Shon Hale, MD Primary Cardiologist: Croitoru Electrophysiologist: Rae Roam Pacing:  >99%   05/31/2023 Office Weight: 190 lbs   AT/AF Burden: 0%    Transmission reviewed.    Corvue Thoracic impedance suggesting normal fluid levels with the exception of possible fluid accumulation from 8/13-8/23.   Prescribed:  Furosemide 40 mg Take 40 mg by mouth daily as needed for edema.  Takes a few days a month.  Potassium 20 mEq Take 20 mEq by mouth daily as needed (when taking furosemide).  Spironolactone 25 mg take 1 tablet daily Warfarin as directed   Labs: 07/18/2022 Creatinine 0.81, BUN 16, Potassium 4.1, Sodium 139, GFR 79 A complete set of results can be found in Results Review.   Recommendations:  No changes   Follow-up plan: ICM clinic phone appointment on 08/06/2023.   91 day device clinic remote transmission 09/11/2023.     EP/Cardiology Office Visits:   Recall 05/25/2024 with Dr Crotioru.  Recall 07/28/2023 with Dr Ladona Ridgel.   Copy of ICM check sent to Dr. Ladona Ridgel.     3 month ICM trend: 07/05/2023.    12-14 Month ICM trend:     Karie Soda, RN 07/06/2023 12:36 PM

## 2023-07-16 ENCOUNTER — Ambulatory Visit: Payer: Medicare Other | Admitting: Rehabilitation

## 2023-07-18 ENCOUNTER — Encounter: Payer: Self-pay | Admitting: Rehabilitation

## 2023-07-18 ENCOUNTER — Ambulatory Visit: Payer: Medicare Other | Attending: Family Medicine | Admitting: Rehabilitation

## 2023-07-18 DIAGNOSIS — Z1501 Genetic susceptibility to malignant neoplasm of breast: Secondary | ICD-10-CM | POA: Insufficient documentation

## 2023-07-18 DIAGNOSIS — I972 Postmastectomy lymphedema syndrome: Secondary | ICD-10-CM

## 2023-07-18 DIAGNOSIS — C50912 Malignant neoplasm of unspecified site of left female breast: Secondary | ICD-10-CM | POA: Insufficient documentation

## 2023-07-18 DIAGNOSIS — Z15060 Genetic susceptibility to colorectal cancer: Secondary | ICD-10-CM

## 2023-07-18 NOTE — Therapy (Signed)
OUTPATIENT PHYSICAL THERAPY  UPPER EXTREMITY ONCOLOGY TREATMENT  Patient Name: Danielle Mckinney MRN: [id number] DOB:[birth year], 72 y.o., female Today's Date: 07/18/2023  END OF SESSION:  PT End of Session - 07/18/23 1554     Visit Number 1   no visit charge   PT Start Time 1500    PT Stop Time 1520    PT Time Calculation (min) 20 min    Activity Tolerance Other (comment)   decided to wait on CDT   Behavior During Therapy [facility] for tasks assessed/performed              Past Medical History:  Diagnosis Date   Atrial fibrillation (HCC)    Automatic implantable cardioverter-defibrillator in situ 08/25/2009   Qualifier: Diagnosis of  By: Ladona Ridgel, MD, [facility], [place]ergia Alcon     Biventricular ICD (implantable cardioverter-defibrillator) in place    [employer]ude   Breast cancer (HCC)    1989, 1998   Cancer (HCC)    Breast cancer-BRACA I gene   CHF 08/18/2009   Qualifier: Diagnosis of  By: Oswald Hillock     CHF (congestive heart failure) (HCC)    Chronic diastolic CHF (congestive heart failure) (HCC) 04/23/2012   CIN I (cervical intraepithelial neoplasia I)    CVA (cerebral vascular accident) (HCC) 01/15/2013   Essential hypertension 08/18/2009   Qualifier: Diagnosis of  By: Oswald Hillock     Fibroid    Fibroid uterus 04/15/2013   Genetic testing 07/06/2015   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  The report date is January 21, 1998.    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   Stroke [facility])    Past Surgical History:  Procedure Laterality Date   BIV ICD GENERTAOR CHANGE OUT  12/20/2011   [employer]   BIV ICD GENERTAOR CHANGE OUT N/A 12/20/2011   Procedure: BIV ICD GENERTAOR CHANGE OUT;  Surgeon: Marinus Maw, MD;  Location: [facility] CATH LAB;  Service: Cardiovascular;  Laterality: N/A;   BIV PACEMAKER  GENERATOR CHANGEOUT N/A 12/16/2019   Procedure: DOWNGRADE TO BIV PACEMAKER;  Surgeon: Marinus Maw, MD;  Location: [facility] INVASIVE CV LAB;  Service: Cardiovascular;  Laterality: N/A;   BREAST SURGERY     Bilateral mastctomy and reconstruction TRAM   CARDIAC DEFIBRILLATOR PLACEMENT     CERVICAL CONE BIOPSY     COLPOSCOPY     GYNECOLOGIC CRYOSURGERY     MASTECTOMY     OOPHORECTOMY     BSO   TRANSTHORACIC ECHOCARDIOGRAM  08/14/2006   TUBAL LIGATION     US ECHOCARDIOGRAPHY  06/25/2012   borderline concentric LVH,LV systolic fx mildly reduced,impaired LV relaxation   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     EVAL: I think I may want to do the velcro but I will try the bandaging today.  My right shoulder has been hurting.   It started swelling about 1 year ago.  (Chart review shows she noted arm swelling in 2019 in a note with Lillard Anes)   PERTINENT HISTORY: *Pacemaker, Bil mastectomy and reconstruction with TRAM in 2000 with hx of bilateral cancers. Treated aggressively with chemo and SCT in 1998 due to left breast cancer leading to heart issues which are mostly resolved, CHF, Afib, CVA.  Decided to "graduate" from oncology care when Dr. Darnelle Catalan retired. Hx of Rt breast cancer with radiation and SLNB but unsure how many were taken.    PAIN:  Are you having pain? No not currently but the Rt arm is hurting up to 6-7/10   PRECAUTIONS: Pacemaker  RED FLAGS: None   WEIGHT BEARING RESTRICTIONS: No  FALLS:  Has patient fallen in last 6 months? No  LIVING ENVIRONMENT: Lives with: lives alone  OCCUPATION: Retired after CVA   LEISURE: line dance, bowling   HAND DOMINANCE: right   PRIOR LEVEL OF FUNCTION: Independent  PATIENT GOALS: what to do for the swelling   OBJECTIVE:  COGNITION: Overall cognitive status: Within functional limits for tasks assessed   PALPATION: Non pitting Lt UE lymphedema   OBSERVATIONS / OTHER ASSESSMENTS:  Skin darkening on the Left chest which was suspected to be from radiation but pt noted it started after her pacemeaker was inserted and she reports cardiology is aware of it and thinks it may be from chronic inflammation.  A note was sent to cardiology to verify that this is the case.     POSTURE: rounded shoulders   UPPER EXTREMITY AROM/PROM:  A/PROM LEFT Eval - just feels heavy    Shoulder extension   Shoulder flexion 120  Shoulder abduction 128  Shoulder internal rotation  Shoulder external rotation     (Blank rows = not tested)  A/PROM RIGHT eval   Shoulder extension   Shoulder flexion 135  Shoulder abduction 155  Shoulder internal rotation   Shoulder external rotation     (Blank rows = not tested)  LYMPHEDEMA ASSESSMENTS:   LANDMARK RIGHT  eval  At axilla    15 cm proximal to olecranon process 38.5  10 cm proximal to olecranon process 35.8  Olecranon process 30.0  15 cm proximal to ulnar styloid process 30.3  10 cm proximal to ulnar styloid process 27.3  Just proximal to ulnar styloid process 19.5  Across hand at thumb web space 21  At base of 2nd digit 7.1  (Blank rows = not tested)  LANDMARK LEFT  eval 07/18/23  At axilla     15 cm proximal to olecranon process 43 42  10 cm proximal to olecranon process 42.5   Olecranon process 34.5 34  15 cm proximal to ulnar styloid process 33.3   10 cm proximal to ulnar styloid process 29.8 29.2  Just proximal to ulnar styloid process 20.0 19.4  Across hand at thumb web space 21.1 21  At base of 2nd digit 7.1 7.1  Rt: Lt:  difference in volume   (Blank rows = not tested)  TODAY'S TREATMENT:                                                                                                                                          DATE:  07/18/23 Discussed how I will send a note to oncology to set up a visit to Rule out any other causes of swelling and discoloration.  PT agrees that it should be looked at.   06/05/23 Eval performed  Discussed all CDT options including bandaging, velcro, and obtaining garments Pt hesitant to start right away so she will schedule for mid September with bandaging attempts.    PATIENT EDUCATION:  Education details: CDT options Person educated: Patient Education method: Explanation Education comprehension: verbalized understanding  HOME EXERCISE PROGRAM: none  ASSESSMENT:  CLINICAL IMPRESSION:  Note sent to Lillard Anes NP to set up a patient follow up.  Will cancel Friday and play next week by ear.     OBJECTIVE  IMPAIRMENTS: decreased ROM, decreased strength, increased edema, and impaired UE functional use.   ACTIVITY LIMITATIONS: lifting  PARTICIPATION LIMITATIONS: cleaning and community activity  PERSONAL FACTORS: Age, Fitness, Time since onset of injury/illness/exacerbation, and 1-2 comorbidities: radiation hx, breast cancer hx  are also affecting patient's functional outcome.   REHAB POTENTIAL: Good  CLINICAL DECISION MAKING: Evolving/moderate complexity  EVALUATION COMPLEXITY: Moderate  GOALS: Goals reviewed with patient? Yes  SHORT TERM GOALS=LTGs: Target date: 09/10/23  Pt will obtain day and night compression after maximal reduction to control lymphedema  Baseline: Goal status: INITIAL  2.  Pt will be ind with self MLD  or use of a compression pump  Baseline:  Goal status: INITIAL  3.  Pt will be ind with stretches for shoulder ROM and mobility  Baseline:  Goal status: INITIAL  PLAN:  PT FREQUENCY: 3x/week  PT DURATION: 8 weeks  PLANNED INTERVENTIONS: Therapeutic exercises, Therapeutic activity, Neuromuscular re-education, Balance training, Gait training, Patient/Family education, Self Care, and Joint mobilization  PLAN FOR NEXT SESSION: Lt UE CDT - pt hesitant about bandaging but wants to try,  cardiology note back about pacemaker skin changes or oncology follow up?  Idamae Lusher, PT 07/18/2023, 3:55 PM

## 2023-07-20 ENCOUNTER — Encounter: Payer: Medicare Other | Admitting: Rehabilitation

## 2023-07-23 ENCOUNTER — Ambulatory Visit: Payer: Medicare Other | Admitting: Physical Therapy

## 2023-07-25 ENCOUNTER — Encounter: Payer: Medicare Other | Admitting: Physical Therapy

## 2023-07-27 ENCOUNTER — Encounter: Payer: Medicare Other | Admitting: Rehabilitation

## 2023-07-30 ENCOUNTER — Other Ambulatory Visit: Payer: Self-pay | Admitting: Internal Medicine

## 2023-07-30 ENCOUNTER — Ambulatory Visit: Payer: Medicare Other | Admitting: Rehabilitation

## 2023-07-30 ENCOUNTER — Other Ambulatory Visit: Payer: Self-pay | Admitting: Cardiovascular Disease

## 2023-07-30 DIAGNOSIS — I4891 Unspecified atrial fibrillation: Secondary | ICD-10-CM

## 2023-07-30 NOTE — Telephone Encounter (Signed)
Warfarin 4mg  refill Afib Last INR 07/05/23 Last OV 05/31/23

## 2023-08-01 ENCOUNTER — Encounter: Payer: Medicare Other | Admitting: Rehabilitation

## 2023-08-01 ENCOUNTER — Other Ambulatory Visit: Payer: Self-pay | Admitting: Internal Medicine

## 2023-08-02 ENCOUNTER — Ambulatory Visit: Payer: Medicare Other | Attending: Internal Medicine | Admitting: *Deleted

## 2023-08-02 DIAGNOSIS — Z7901 Long term (current) use of anticoagulants: Secondary | ICD-10-CM | POA: Diagnosis not present

## 2023-08-02 DIAGNOSIS — I639 Cerebral infarction, unspecified: Secondary | ICD-10-CM | POA: Diagnosis not present

## 2023-08-02 DIAGNOSIS — I480 Paroxysmal atrial fibrillation: Secondary | ICD-10-CM

## 2023-08-02 LAB — POCT INR: INR: 3.1 — AB (ref 2.0–3.0)

## 2023-08-02 NOTE — Patient Instructions (Addendum)
Description   Do not take any warfarin today then START taking warfarin 1 tablet daily except for 1.5 tablets on Sundays. Recheck INR in 3 weeks. Coumadin Clinic [phone]

## 2023-08-03 ENCOUNTER — Inpatient Hospital Stay: Payer: Medicare Other | Attending: Adult Health | Admitting: Adult Health

## 2023-08-03 ENCOUNTER — Encounter: Payer: Medicare Other | Admitting: Rehabilitation

## 2023-08-03 ENCOUNTER — Encounter: Payer: Self-pay | Admitting: Adult Health

## 2023-08-03 VITALS — BP 154/63 | HR 72 | Temp 97.8°F | Resp 18 | Ht 62.0 in | Wt 190.4 lb

## 2023-08-03 DIAGNOSIS — C50511 Malignant neoplasm of lower-outer quadrant of right female breast: Secondary | ICD-10-CM | POA: Diagnosis not present

## 2023-08-03 DIAGNOSIS — I429 Cardiomyopathy, unspecified: Secondary | ICD-10-CM | POA: Insufficient documentation

## 2023-08-03 DIAGNOSIS — Z923 Personal history of irradiation: Secondary | ICD-10-CM | POA: Insufficient documentation

## 2023-08-03 DIAGNOSIS — I4891 Unspecified atrial fibrillation: Secondary | ICD-10-CM | POA: Diagnosis not present

## 2023-08-03 DIAGNOSIS — Z170 Estrogen receptor positive status [ER+]: Secondary | ICD-10-CM | POA: Insufficient documentation

## 2023-08-03 DIAGNOSIS — Z7901 Long term (current) use of anticoagulants: Secondary | ICD-10-CM | POA: Insufficient documentation

## 2023-08-03 DIAGNOSIS — Z8673 Personal history of transient ischemic attack (TIA), and cerebral infarction without residual deficits: Secondary | ICD-10-CM | POA: Diagnosis not present

## 2023-08-03 DIAGNOSIS — Z1501 Genetic susceptibility to malignant neoplasm of breast: Secondary | ICD-10-CM

## 2023-08-03 DIAGNOSIS — M7989 Other specified soft tissue disorders: Secondary | ICD-10-CM

## 2023-08-03 DIAGNOSIS — I5032 Chronic diastolic (congestive) heart failure: Secondary | ICD-10-CM | POA: Insufficient documentation

## 2023-08-03 DIAGNOSIS — I110 Hypertensive heart disease with heart failure: Secondary | ICD-10-CM | POA: Diagnosis not present

## 2023-08-03 DIAGNOSIS — Z9013 Acquired absence of bilateral breasts and nipples: Secondary | ICD-10-CM | POA: Insufficient documentation

## 2023-08-03 DIAGNOSIS — D563 Thalassemia minor: Secondary | ICD-10-CM | POA: Diagnosis not present

## 2023-08-03 DIAGNOSIS — Z171 Estrogen receptor negative status [ER-]: Secondary | ICD-10-CM

## 2023-08-03 DIAGNOSIS — I890 Lymphedema, not elsewhere classified: Secondary | ICD-10-CM | POA: Insufficient documentation

## 2023-08-03 DIAGNOSIS — C50912 Malignant neoplasm of unspecified site of left female breast: Secondary | ICD-10-CM | POA: Diagnosis not present

## 2023-08-03 NOTE — Progress Notes (Unsigned)
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 BRCA positive [place] woman   (1) s/p Right lumpectomy in 1990 for a Stage II breast cancer treated with doxorubicin, cyclophosphamide and 5- fluorouracil, followed by radiation.   (2) Left lumpectomy in April of 1998 for a "Stage IV" tumor (she had left supraclavicular involvement which currently would be staged as III-C),  Estrogen and progesterone receptor negative, HER2 unknown             (a) treated with doxorubicin and paclitaxel followed by high-dose chemotherapy at [facility] with stem cell rescue  in December of 1998              (b) received radiation to the left supraclavicular, left axillary and left chest wall areas, completed March of 1999              (3) BRCA positivity:             (a) s/p bilateral mastectomies with flap reconstruction January 2000 with no evidence of residual disease.             (b) status post remote bilateral salpingo-oophorectomy   (4) cardiomyopathy likely related to her prior chemotherapy             (a) echo  04/14/2014 shows an ejection fraction in the 60-65% range             (b)  Grade 1 diastolic dysfunction             (c) echo February 2021 shows an ejection fraction in the 55-60% range.   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             (a)  on lifelong anticoagulation   (6) alpha thalassemia trait, with an MCV in the high 70s chronically  CURRENT THERAPY:  INTERVAL  HISTORY: Danielle Mckinney 72 y.o. 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 (HCC) 07/01/2015   Breast cancer, right breast (HCC) 07/01/2015   Fibroid uterus 04/15/2013   Long term current use of anticoagulant therapy 01/15/2013   CVA (cerebral vascular accident) (HCC) 01/15/2013   Chronic diastolic CHF (congestive heart failure) (HCC) 04/23/2012   Fibroid    CIN I (cervical intraepithelial neoplasia I)    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   Essential hypertension 08/18/2009   Atrial fibrillation (HCC) 08/18/2009   CHF 08/18/2009    is allergic to codeine, hydrocodone-acetaminophen, lisinopril, oxycodone, penicillins, and sulfa antibiotics.  MEDICAL HISTORY: Past Medical History:  Diagnosis Date   Atrial fibrillation [facility])    Automatic implantable cardioverter-defibrillator in situ 08/25/2009   Qualifier: Diagnosis of  By: Ladona Ridgel, MD, Jerrell Mylar     Biventricular  ICD (implantable cardioverter-defibrillator) in place    [employer]   Breast cancer (HCC)    1989, 1998   Cancer (HCC)    Breast cancer-BRACA I gene   CHF 08/18/2009   Qualifier: Diagnosis of  By: Oswald Hillock     CHF (congestive heart failure) (HCC)    Chronic diastolic CHF (congestive heart failure) (HCC) 04/23/2012   CIN I (cervical intraepithelial neoplasia I)    CVA (cerebral vascular accident) (HCC) 01/15/2013   Essential hypertension 08/18/2009   Qualifier: Diagnosis of  By: Oswald Hillock     Fibroid    Fibroid uterus 04/15/2013   Genetic testing 07/06/2015   BRCA1 c.191G>A (C64Y) pathogenic mutation found at [employer] at [facility].  The report date is January 21, 1998.    HSV infection    HTN (hypertension)     Long term current use of anticoagulant therapy 01/15/2013   Nonischemic cardiomyopathy (HCC)    related to anthracycline chemotherapy   Stroke [facility])     SURGICAL HISTORY: Past Surgical History:  Procedure Laterality Date   BIV ICD GENERTAOR CHANGE OUT  12/20/2011   [employer]   BIV ICD GENERTAOR CHANGE OUT N/A 12/20/2011   Procedure: BIV ICD GENERTAOR CHANGE OUT;  Surgeon: Marinus Maw, MD;  [facility];  Service: Cardiovascular;  Laterality: N/A;   BIV PACEMAKER GENERATOR CHANGEOUT N/A 12/16/2019   Procedure: DOWNGRADE TO BIV PACEMAKER;  Surgeon: Marinus Maw, MD;  Location: [facility] INVASIVE CV LAB;  Service: Cardiovascular;  Laterality: N/A;   BREAST SURGERY     Bilateral mastctomy and reconstruction TRAM   CARDIAC DEFIBRILLATOR PLACEMENT     CERVICAL CONE BIOPSY     COLPOSCOPY     GYNECOLOGIC CRYOSURGERY     MASTECTOMY     OOPHORECTOMY     BSO   TRANSTHORACIC ECHOCARDIOGRAM  08/14/2006   TUBAL LIGATION     US ECHOCARDIOGRAPHY  06/25/2012   borderline concentric LVH,LV systolic fx mildly reduced,impaired LV relaxation    SOCIAL HISTORY: Social History   Socioeconomic History   Marital status: Divorced    Spouse name: Not on file   Number of children: Not on file   Years of education: Not on file   Highest education level: Not on file  Occupational History   Not on file  Tobacco Use   Smoking status: Never   Smokeless tobacco: Never  Vaping Use   Vaping status: Never Used  Substance and Sexual Activity   Alcohol use: No    Alcohol/week: 0.0 standard drinks of alcohol   Drug use: No   Sexual activity: Yes    Birth control/protection: Post-menopausal    Comment: younger than 30, 5 partners, ovaries removed  Other Topics Concern   Not on file  Social History Narrative   Not on file   Social Determinants of Health   Financial Resource Strain: Not on file  Food Insecurity: Not on file  Transportation Needs: Not on file  Physical Activity: Not on file   Stress: Not on file  Social Connections: Not on file  Intimate Partner Violence: Not on file    FAMILY HISTORY: Family History  Problem Relation Age of Onset   Breast cancer Sister        Age 58   Hypertension Sister    Heart disease Maternal Grandfather    Clotting disorder Mother    Cirrhosis Father    Coronary artery disease Other    Hypertension Brother  Review of Systems - Oncology    PHYSICAL EXAMINATION   Onc Performance Status - 08/03/23 1615       ECOG Perf Status   ECOG Perf Status Restricted in physically strenuous activity but ambulatory and able to carry out work of a light or sedentary nature, e.g., light house work, office work      KPS SCALE   KPS % SCORE Able to carry on normal activity, minor s/s of disease             Vitals:   08/03/23 1556  BP: (!) 154/63  Pulse: 72  Resp: 18  Temp: 97.8 F (36.6 C)  SpO2: 98%    Physical Exam  LABORATORY DATA:  CBC    Component Value Date/Time   WBC 6.5 07/18/2022 1702   WBC 7.4 10/06/2021 1434   RBC 4.42 07/18/2022 1702   RBC 4.54 10/06/2021 1434   HGB 11.6 07/18/2022 1702   HGB 12.2 07/12/2017 1445   HCT 34.4 07/18/2022 1702   HCT 36.8 07/12/2017 1445   PLT 201 07/18/2022 1702   MCV 78 (L) 07/18/2022 1702   MCV 79.4 (L) 07/12/2017 1445   MCH 26.2 (L) 07/18/2022 1702   MCH 26.9 10/06/2021 1434   MCHC 33.7 07/18/2022 1702   MCHC 33.8 10/06/2021 1434   RDW 15.2 07/18/2022 1702   RDW 15.9 (H) 07/12/2017 1445   LYMPHSABS 2.7 10/06/2021 1434   LYMPHSABS 2.4 12/08/2019 1447   LYMPHSABS 2.2 07/12/2017 1445   MONOABS 1.0 10/06/2021 1434   MONOABS 0.7 07/12/2017 1445   EOSABS 0.3 10/06/2021 1434   EOSABS 0.3 12/08/2019 1447   BASOSABS 0.0 10/06/2021 1434   BASOSABS 0.0 12/08/2019 1447   BASOSABS 0.0 07/12/2017 1445    CMP     Component Value Date/Time   NA 139 07/18/2022 1702   NA 139 07/12/2017 1445   K 4.1 07/18/2022 1702   K 4.2 07/12/2017 1445   CL 101 07/18/2022 1702    CO2 25 07/18/2022 1702   CO2 26 07/12/2017 1445   GLUCOSE 104 (H) 07/18/2022 1702   GLUCOSE 106 (H) 10/06/2021 1434   GLUCOSE 103 07/12/2017 1445   BUN 16 07/18/2022 1702   BUN 12.7 07/12/2017 1445   CREATININE 0.81 07/18/2022 1702   CREATININE 0.8 07/12/2017 1445   CALCIUM 9.8 07/18/2022 1702   CALCIUM 9.9 07/12/2017 1445   PROT 7.7 10/06/2021 1434   PROT 7.3 07/12/2017 1445   ALBUMIN 3.9 10/06/2021 1434   ALBUMIN 3.9 07/12/2017 1445   AST 22 10/06/2021 1434   AST 19 07/12/2017 1445   ALT 15 10/06/2021 1434   ALT 8 07/12/2017 1445   ALKPHOS 67 10/06/2021 1434   ALKPHOS 61 07/12/2017 1445   BILITOT 0.7 10/06/2021 1434   BILITOT 0.54 07/12/2017 1445   GFRNONAA 52 (L) 10/06/2021 1434   GFRAA 92 12/08/2019 1447       PENDING LABS:   RADIOGRAPHIC STUDIES:  No results found.   PATHOLOGY:     ASSESSMENT and THERAPY PLAN:   No problem-specific Assessment & Plan notes found for this encounter.   No orders of the defined types were placed in this encounter.   All questions were answered. The patient knows to call the clinic with any problems, questions or concerns. We can certainly see the patient much sooner if necessary. This note was electronically signed. Noreene Filbert, NP 08/03/2023

## 2023-08-06 ENCOUNTER — Ambulatory Visit: Payer: Medicare Other

## 2023-08-08 ENCOUNTER — Encounter: Payer: Medicare Other | Admitting: Physical Therapy

## 2023-08-09 ENCOUNTER — Telehealth: Payer: Self-pay | Admitting: Adult Health

## 2023-08-09 ENCOUNTER — Telehealth: Payer: Self-pay

## 2023-08-09 NOTE — Assessment & Plan Note (Addendum)
Danielle Mckinney is a 72 year old woman with history of stage IIIc left-sided breast cancer.  This was a T2N3 tumor meaning that she had a lot of lymph nodes involved.  Left arm swelling and chest wall discoloration: Placed orders for CT chest with and without contrast to evaluate for a possible site of obstructive lymphadenopathy.    RTC about a week after CT scan to review results.

## 2023-08-09 NOTE — Telephone Encounter (Signed)
Left patient a message regarding follow up appointment times/dates with [facility]

## 2023-08-09 NOTE — Telephone Encounter (Signed)
Pt agreed to send missed ICM transmission. 

## 2023-08-10 ENCOUNTER — Ambulatory Visit (HOSPITAL_COMMUNITY)
Admission: RE | Admit: 2023-08-10 | Discharge: 2023-08-10 | Disposition: A | Discharge status: Home / Self Care | Payer: Medicare Other | Source: Ambulatory Visit | Attending: Adult Health | Admitting: Adult Health

## 2023-08-10 ENCOUNTER — Encounter: Payer: Medicare Other | Admitting: Rehabilitation

## 2023-08-10 DIAGNOSIS — C50511 Malignant neoplasm of lower-outer quadrant of right female breast: Secondary | ICD-10-CM

## 2023-08-10 DIAGNOSIS — Z1501 Genetic susceptibility to malignant neoplasm of breast: Secondary | ICD-10-CM | POA: Insufficient documentation

## 2023-08-10 DIAGNOSIS — Z171 Estrogen receptor negative status [ER-]: Secondary | ICD-10-CM | POA: Insufficient documentation

## 2023-08-10 DIAGNOSIS — Z15060 Genetic susceptibility to colorectal cancer: Secondary | ICD-10-CM

## 2023-08-10 DIAGNOSIS — C50912 Malignant neoplasm of unspecified site of left female breast: Secondary | ICD-10-CM | POA: Insufficient documentation

## 2023-08-10 DIAGNOSIS — M7989 Other specified soft tissue disorders: Secondary | ICD-10-CM | POA: Diagnosis present

## 2023-08-10 MED ORDER — IOHEXOL 300 MG/ML  SOLN
75.0000 mL | Freq: Once | INTRAMUSCULAR | Status: AC | PRN
  Administered 2023-08-10: 75 mL via INTRAVENOUS

## 2023-08-13 ENCOUNTER — Encounter: Payer: Medicare Other | Admitting: Rehabilitation

## 2023-08-15 ENCOUNTER — Encounter: Payer: Medicare Other | Admitting: Rehabilitation

## 2023-08-17 ENCOUNTER — Encounter: Payer: Self-pay | Admitting: Adult Health

## 2023-08-17 ENCOUNTER — Inpatient Hospital Stay (HOSPITAL_BASED_OUTPATIENT_CLINIC_OR_DEPARTMENT_OTHER): Payer: Medicare Other | Admitting: Adult Health

## 2023-08-17 ENCOUNTER — Encounter: Payer: Medicare Other | Admitting: Rehabilitation

## 2023-08-17 VITALS — BP 147/59 | HR 72 | Temp 97.5°F | Resp 18 | Ht 62.0 in | Wt 190.5 lb

## 2023-08-17 DIAGNOSIS — Z1501 Genetic susceptibility to malignant neoplasm of breast: Secondary | ICD-10-CM | POA: Diagnosis not present

## 2023-08-17 DIAGNOSIS — L819 Disorder of pigmentation, unspecified: Secondary | ICD-10-CM

## 2023-08-17 DIAGNOSIS — C50912 Malignant neoplasm of unspecified site of left female breast: Secondary | ICD-10-CM

## 2023-08-17 NOTE — Progress Notes (Signed)
No ICM remote transmission received for 08/06/2023 and next ICM transmission scheduled for 09/03/2023.

## 2023-08-20 ENCOUNTER — Ambulatory Visit: Payer: Medicare Other

## 2023-08-20 ENCOUNTER — Telehealth: Payer: Self-pay

## 2023-08-20 NOTE — Progress Notes (Signed)
[facility] Cancer Follow up:    Danielle Hale, MD [address] 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   (2) Left lumpectomy in April of 1998 for a "Stage IV" tumor (she had left supraclavicular involvement which currently would be staged as III-C),  Estrogen and progesterone receptor negative, HER2 unknown             (a) treated with doxorubicin and paclitaxel followed by high-dose chemotherapy at [facility] with stem cell rescue  in December of 1998              (b) received radiation to the left supraclavicular, left axillary and left chest wall areas, completed March of 1999              (3) BRCA positivity:             (a) s/p bilateral mastectomies with flap reconstruction January 2000 with no evidence of residual disease.             (b) status post remote bilateral salpingo-oophorectomy   (4) cardiomyopathy likely related to her prior chemotherapy             (a) echo  04/14/2014 shows an ejection fraction in the 60-65% range             (b)  Grade 1 diastolic dysfunction             (c) echo February 2021 shows an ejection fraction in the 55-60% range.   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             (a)  on lifelong anticoagulation   (6) alpha thalassemia trait, with an MCV in the high 70s chronically    CURRENT THERAPY:  observation  INTERVAL HISTORY: Danielle Mckinney 72 y.o. female returns for follow-up and evaluation.  She had worsening left arm lymphedema and skin discoloration in her upper left chest wall.  She was sent to oncology for evaluation and recommendations by physical therapy. This prompted CT chest that was performed on August 10, 2023 demonstrating no evidence of lymphedema, bilateral mastectomies with axillary dissection and pacemaker in the upper left chest wall.  Danielle Mckinney tells me that she is happy that there is no cancer that is causing the swelling in her arm.  She still is experiencing lymphedema and the skin discoloration/change.   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 (HCC) 07/01/2015   Breast cancer, right breast (HCC) 07/01/2015   Fibroid uterus 04/15/2013   Long term current use of anticoagulant therapy 01/15/2013   CVA (cerebral vascular accident) (HCC) 01/15/2013   Chronic diastolic CHF (congestive  heart failure) (HCC) 04/23/2012   Fibroid    CIN I (cervical intraepithelial neoplasia I)    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   Essential hypertension 08/18/2009   Atrial fibrillation (HCC) 08/18/2009   CHF 08/18/2009    is allergic to codeine, hydrocodone-acetaminophen, lisinopril, oxycodone, penicillins, and sulfa antibiotics.  MEDICAL HISTORY: Past Medical History:  Diagnosis Date   Atrial fibrillation [facility])    Automatic implantable cardioverter-defibrillator in situ 08/25/2009   Qualifier: Diagnosis of  By: Ladona Ridgel, MD, [facility]     Biventricular ICD (implantable cardioverter-defibrillator) in place    [employer]   Breast cancer [facility])    1989, 1998   Cancer (HCC)    Breast cancer-BRACA I gene   CHF 08/18/2009    Qualifier: Diagnosis of  By: Oswald Hillock     CHF (congestive heart failure) (HCC)    Chronic diastolic CHF (congestive heart failure) (HCC) 04/23/2012   CIN I (cervical intraepithelial neoplasia I)    CVA (cerebral vascular accident) (HCC) 01/15/2013   Essential hypertension 08/18/2009   Qualifier: Diagnosis of  By: Oswald Hillock     Fibroid    Fibroid uterus 04/15/2013   Genetic testing 07/06/2015   BRCA1 c.191G>A (C64Y) pathogenic mutation found at [employer] at [facility].  The report date is January 21, 1998.    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   Stroke [facility])     SURGICAL HISTORY: Past Surgical History:  Procedure Laterality Date   BIV ICD GENERTAOR CHANGE OUT  12/20/2011   [employer]   BIV ICD GENERTAOR CHANGE OUT N/A 12/20/2011   Procedure: BIV ICD GENERTAOR CHANGE OUT;  Surgeon: Marinus Maw, MD;  Location: [facility] CATH LAB;  Service: Cardiovascular;  Laterality: N/A;   BIV PACEMAKER GENERATOR CHANGEOUT N/A 12/16/2019   Procedure: DOWNGRADE TO BIV PACEMAKER;  Surgeon: Marinus Maw, MD;  Location: [facility] INVASIVE CV LAB;  Service: Cardiovascular;  Laterality: N/A;   BREAST SURGERY     Bilateral mastctomy and reconstruction TRAM   CARDIAC DEFIBRILLATOR PLACEMENT     CERVICAL CONE BIOPSY     COLPOSCOPY     GYNECOLOGIC CRYOSURGERY     MASTECTOMY     OOPHORECTOMY     BSO   TRANSTHORACIC ECHOCARDIOGRAM  08/14/2006   TUBAL LIGATION     US ECHOCARDIOGRAPHY  06/25/2012   borderline concentric LVH,LV systolic fx mildly reduced,impaired LV relaxation    SOCIAL HISTORY: Social History   Socioeconomic History   Marital status: Divorced    Spouse name: Not on file   Number of children: Not on file   Years of education: Not on file   Highest education level: Not on file  Occupational History   Not on file  Tobacco Use    Smoking status: Never   Smokeless tobacco: Never  Vaping Use   Vaping status: Never Used  Substance and Sexual Activity   Alcohol use: No    Alcohol/week: 0.0 standard drinks of alcohol   Drug use: No   Sexual activity: Yes    Birth control/protection: Post-menopausal    Comment: younger than 57, 5 partners, ovaries removed  Other Topics Concern   Not on file  Social History Narrative   Not on file   Social Determinants of Health   Financial Resource Strain: Not on file  Food Insecurity: Not on  file  Transportation Needs: Not on file  Physical Activity: Not on file  Stress: Not on file  Social Connections: Not on file  Intimate Partner Violence: Not on file    FAMILY HISTORY: Family History  Problem Relation Age of Onset   Breast cancer Sister        Age 60   Hypertension Sister    Heart disease Maternal Grandfather    Clotting disorder Mother    Cirrhosis Father    Coronary artery disease Other    Hypertension Brother     Review of Systems  Constitutional:  Negative for appetite change, chills, fatigue, fever and unexpected weight change.  HENT:   Negative for hearing loss, lump/mass and trouble swallowing.   Eyes:  Negative for eye problems and icterus.  Respiratory:  Negative for chest tightness, cough and shortness of breath.   Cardiovascular:  Negative for chest pain, leg swelling and palpitations.  Gastrointestinal:  Negative for abdominal distention, abdominal pain, constipation, diarrhea, nausea and vomiting.  Endocrine: Negative for hot flashes.  Genitourinary:  Negative for difficulty urinating.   Musculoskeletal:  Negative for arthralgias.  Skin:  Negative for itching and rash.  Neurological:  Negative for dizziness, extremity weakness, headaches and numbness.  Hematological:  Negative for adenopathy. Does not bruise/bleed easily.  Psychiatric/Behavioral:  Negative for depression. The patient is not nervous/anxious.       PHYSICAL  EXAMINATION    Vitals:   08/17/23 1321  BP: (!) 147/59  Pulse: 72  Resp: 18  Temp: (!) 97.5 F (36.4 C)  SpO2: 96%  Patient appears well, in no apparent distress, mood and behavior normal, speech normal.  Rest of exam deferred in lieu of counseling.      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  This was a T2N3 tumor meaning that she had a lot of lymph nodes involved.  Danielle Mckinney has no radiologic sign of breast cancer recurrence.  Since she is still experiencing the skin discoloration and lymphedema I am referring her to Danielle Mckinney out of an abundance of caution to take a look at the skin and ensure she does not need a punch biopsy.  RTC as needed based on above.    All questions were answered. The patient knows to call the clinic with any problems, questions or concerns. We can certainly see the patient much sooner if necessary.  Total encounter time:15 minutes*in face-to-face visit time, chart review, lab review, care coordination, order entry, and documentation of the encounter time.    Lillard Anes, NP 08/20/23 8:07 AM Medical Oncology and Hematology [facility] [address] [place], [place] [address] Tel. [phone]    Fax. [phone]  *Total Encounter Time as defined by the Centers for Medicare and Medicaid Services includes, in addition to the face-to-face time of a patient visit (documented in the note above) non-face-to-face time: obtaining and reviewing outside history, ordering and reviewing medications, tests or procedures, care coordination (communications with other health care professionals or caregivers) and documentation in the medical record.

## 2023-08-20 NOTE — Assessment & Plan Note (Signed)
Danielle Mckinney is a 72 year old woman with history of stage IIIc left-sided breast cancer.  This was a T2N3 tumor meaning that she had a lot of lymph nodes involved.  Danielle Mckinney has no radiologic sign of breast cancer recurrence.  Since she is still experiencing the skin discoloration and lymphedema I am referring her to Danielle Mckinney out of an abundance of caution to take a look at the skin and ensure she does not need a punch biopsy.  RTC as needed based on above.

## 2023-08-20 NOTE — Telephone Encounter (Signed)
Faxed referral to [facility] Surgery to Dr. Corliss Skains, faxed to [phone]. Received fax confirmation.

## 2023-08-22 ENCOUNTER — Encounter: Payer: Medicare Other | Admitting: Physical Therapy

## 2023-08-22 ENCOUNTER — Ambulatory Visit: Payer: Medicare Other | Attending: Internal Medicine

## 2023-08-22 DIAGNOSIS — Z45018 Encounter for adjustment and management of other part of cardiac pacemaker: Secondary | ICD-10-CM | POA: Diagnosis not present

## 2023-08-22 DIAGNOSIS — I5032 Chronic diastolic (congestive) heart failure: Secondary | ICD-10-CM

## 2023-08-23 ENCOUNTER — Ambulatory Visit: Payer: Medicare Other

## 2023-08-23 NOTE — Progress Notes (Signed)
EPIC Encounter for ICM Monitoring  Patient Name: Danielle Mckinney is a 71 y.o. female Date: 08/23/2023 Primary Care Physican: Shon Hale, MD Primary Cardiologist: Croitoru Electrophysiologist: Rae Roam Pacing:  >99%   05/31/2023 Office Weight: 190 lbs   AT/AF Burden: 0%    Spoke with patient and heart failure questions reviewed.  Transmission results reviewed.  Pt asymptomatic for fluid accumulation.  Reports feeling well at this time and voices no complaints.     [facility]vue Thoracic impedance suggesting normal fluid levels with the exception of possible fluid accumulation from 9/27-10/4   Prescribed:  Furosemide 40 mg Take 40 mg by mouth daily as needed for edema.  Takes a few days a month.  Potassium 20 mEq Take 20 mEq by mouth daily as needed (when taking furosemide).  Spironolactone 25 mg take 1 tablet daily Warfarin as directed   Labs: 07/18/2022 Creatinine 0.81, BUN 16, Potassium 4.1, Sodium 139, GFR 79 A complete set of results can be found in Results Review.   Recommendations:  No changes and encouraged to call if experiencing any fluid symptoms.   Follow-up plan: ICM clinic phone appointment on 10/01/2023.   91 day device clinic remote transmission 09/11/2023.     EP/Cardiology Office Visits:   Recall 05/25/2024 with Dr Crotioru.  Recall 07/28/2023 with Dr Ladona Ridgel.   Copy of ICM check sent to Dr. Ladona Ridgel.     3 month ICM trend: 08/21/2023.    12-14 Month ICM trend:     Karie Soda, RN 08/23/2023 4:37 PM

## 2023-08-24 ENCOUNTER — Encounter: Payer: Medicare Other | Admitting: Rehabilitation

## 2023-08-24 DIAGNOSIS — D6869 Other thrombophilia: Secondary | ICD-10-CM | POA: Insufficient documentation

## 2023-08-27 ENCOUNTER — Ambulatory Visit: Payer: Medicare Other | Attending: Family Medicine | Admitting: Rehabilitation

## 2023-08-27 ENCOUNTER — Encounter: Payer: Self-pay | Admitting: Rehabilitation

## 2023-08-27 DIAGNOSIS — I972 Postmastectomy lymphedema syndrome: Secondary | ICD-10-CM | POA: Insufficient documentation

## 2023-08-27 DIAGNOSIS — Z1501 Genetic susceptibility to malignant neoplasm of breast: Secondary | ICD-10-CM | POA: Insufficient documentation

## 2023-08-27 DIAGNOSIS — C50912 Malignant neoplasm of unspecified site of left female breast: Secondary | ICD-10-CM | POA: Diagnosis present

## 2023-08-27 NOTE — Therapy (Addendum)
 OUTPATIENT PHYSICAL THERAPY  UPPER EXTREMITY ONCOLOGY TREATMENT  Patient Name: Danielle Mckinney MRN: [id number] DOB:[birth year], 72 y.o., female Today's Date: 08/27/2023  END OF SESSION:  PT End of Session - 08/27/23 1659     Visit Number 2    Number of Visits 25    Date for PT Re-Evaluation 09/10/23    PT Start Time 1519    PT Stop Time 1550    PT Time Calculation (min) 31 min    Activity Tolerance Patient tolerated treatment well    Behavior During Therapy [employer] for tasks assessed/performed               Past Medical History:  Diagnosis Date   Atrial fibrillation (HCC)    Automatic implantable cardioverter-defibrillator in situ 08/25/2009   Qualifier: Diagnosis of  By: Carolynne Citron, MD, [place] Mckinney, Danielle Mckinney     Biventricular ICD (implantable cardioverter-defibrillator) in place    [employer]   Breast cancer (HCC)    1989, 1998   Cancer (HCC)    Breast cancer-BRACA I gene   CHF 08/18/2009   Qualifier: Diagnosis of  By: Danielle Mckinney     CHF (congestive heart failure) (HCC)    Chronic diastolic CHF (congestive heart failure) (HCC) 04/23/2012   CIN I (cervical intraepithelial neoplasia I)    CVA (cerebral vascular accident) (HCC) 01/15/2013   Essential hypertension 08/18/2009   Qualifier: Diagnosis of  By: Danielle Mckinney     Fibroid    Fibroid uterus 04/15/2013   Genetic testing 07/06/2015   BRCA1 c.191G>A (C64Y) pathogenic mutation found at [employer] at [facility].  The report date is January 21, 1998.    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   Stroke [place] [facility])    Past Surgical History:  Procedure Laterality Date   BIV ICD GENERTAOR CHANGE OUT  12/20/2011   [employer]   BIV ICD GENERTAOR CHANGE OUT N/A 12/20/2011   Procedure: BIV ICD GENERTAOR CHANGE OUT;  Surgeon: Danielle Fall, MD;  Location: [facility] CATH LAB;  Service:  Cardiovascular;  Laterality: N/A;   BIV PACEMAKER GENERATOR CHANGEOUT N/A 12/16/2019   Procedure: DOWNGRADE TO BIV PACEMAKER;  Surgeon: Danielle Fall, MD;  Location: [facility] INVASIVE CV LAB;  Service: Cardiovascular;  Laterality: N/A;   BREAST SURGERY     Bilateral mastctomy and reconstruction TRAM   CARDIAC DEFIBRILLATOR PLACEMENT     CERVICAL CONE BIOPSY     COLPOSCOPY     GYNECOLOGIC CRYOSURGERY     MASTECTOMY     OOPHORECTOMY     BSO   TRANSTHORACIC ECHOCARDIOGRAM  08/14/2006   TUBAL LIGATION     US  ECHOCARDIOGRAPHY  06/25/2012   borderline concentric LVH,LV systolic fx mildly reduced,impaired LV relaxation   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     EVAL: I think I may want to do the velcro but I will try the bandaging today.  My right shoulder has been hurting.   It started swelling about 1 year ago.  (Chart review shows she noted arm swelling in 2019 in a note with Danielle Mckinney)   PERTINENT HISTORY: *Pacemaker, Bil mastectomy and reconstruction with TRAM in 2000 with hx of bilateral cancers. Treated aggressively with chemo and SCT in 1998 due to left breast cancer leading to heart issues which are mostly resolved, CHF, Afib, CVA.  Decided to "graduate" from oncology care when Dr. Charolett Mckinney retired. Hx of Rt breast cancer with radiation and SLNB but unsure how many were taken.   Recent imaging: no signs of disease, but discoloration is unknown.    PAIN:  Are you having pain? No not currently but the Rt arm is hurting up to 6-7/10   PRECAUTIONS: Pacemaker  RED FLAGS: None   WEIGHT BEARING RESTRICTIONS: No  FALLS:  Has patient fallen in last 6 months? No  LIVING ENVIRONMENT: Lives with: lives alone  OCCUPATION: Retired after CVA   LEISURE: line dance, bowling   HAND DOMINANCE: right   PRIOR LEVEL OF FUNCTION: Independent  PATIENT GOALS: what to do for the swelling   OBJECTIVE:  COGNITION: Overall cognitive status: Within functional limits for tasks assessed   PALPATION: Non pitting Lt UE lymphedema   OBSERVATIONS / OTHER ASSESSMENTS:  Skin darkening on the Left chest which was suspected to be from radiation but pt noted it started after her pacemeaker was inserted and she reports cardiology is aware of it and thinks it may be from chronic inflammation.  A note was sent to cardiology to verify that this is the case.     POSTURE: rounded shoulders   UPPER EXTREMITY AROM/PROM:  A/PROM LEFT Eval -  just feels heavy    Shoulder extension   Shoulder flexion 120  Shoulder abduction  128  Shoulder internal rotation   Shoulder external rotation     (Blank rows = not tested)  A/PROM RIGHT eval  Shoulder extension   Shoulder flexion 135  Shoulder abduction 155  Shoulder internal rotation   Shoulder external rotation     (Blank rows = not tested)  LYMPHEDEMA ASSESSMENTS:   LANDMARK RIGHT  eval  At axilla    15 cm proximal to olecranon process 38.5  10 cm proximal to olecranon process 35.8  Olecranon process 30.0  15 cm proximal to ulnar styloid process 30.3  10 cm proximal to ulnar styloid process 27.3  Just proximal to ulnar styloid process 19.5  Across hand at thumb web space 21  At base of 2nd digit 7.1  (Blank rows = not tested)  LANDMARK LEFT  eval 07/18/23 08/27/23  At axilla      15 cm proximal to olecranon process 43 42 43  10 cm proximal to olecranon process 42.5  42.3  Olecranon process 34.5 34 34.5  15 cm proximal to ulnar styloid process 33.3  33  10 cm proximal to ulnar styloid process 29.8 29.2 28.6  Just proximal to ulnar styloid process 20.0 19.4 19.4  Across hand at thumb web space 21.1 21 21   At base of 2nd digit 7.1 7.1 7.2  Rt: Lt:  difference in volume   (Blank rows = not tested)  TODAY'S TREATMENT:                                                                                                                                          DATE:  08/27/23 Pt returns after oncology clearance.  Applied bandage for test:  tg soft medium, fingers 1-4, artiflex to axilla, 6cm hand bandage, 8cm forearm , and 10cm upper arm. Pt decided that this would be too much for her to do with her lifestyle and we removed the bandage.   Pt fits in a juzo wrap size Large average length and size medium hand which we will order online.  Discussed how medicare is covering 80% after a met deductible at this time and pt is okay with this.    07/18/23 Discussed how I will send a note to oncology to set up a visit to Rule out any other  causes of swelling and discoloration.  PT agrees that it should be looked at.   06/05/23 Eval performed  Discussed all CDT options including bandaging, velcro, and obtaining garments Pt hesitant to start right away so she will schedule for mid September with bandaging attempts.    PATIENT EDUCATION:  Education details: CDT options Person educated: Patient Education method: Explanation Education comprehension: verbalized understanding  HOME EXERCISE PROGRAM: none  ASSESSMENT:  CLINICAL IMPRESSION:  Pt returns after okay from oncology, but she  will going to talk with Dr. Alethea Andes just to make sure they have no concerns regarding the Lt sided discoloration.  Attempted compression bandaging which pt decided she did not want to continue due to size of wrap ,needing to wash her hands a lot, etc.  We will switch to velcro compression.  She will benefit from velcro adjustable compression due to needing to attempt to reduce but not wanting to use short stretch bandages.  She may get a traditional sleeve at some point.    OBJECTIVE IMPAIRMENTS: decreased ROM, decreased strength, increased edema, and impaired UE functional use.   ACTIVITY LIMITATIONS: lifting  PARTICIPATION LIMITATIONS: cleaning and community activity  PERSONAL FACTORS: Age, Fitness, Time since onset of injury/illness/exacerbation, and 1-2 comorbidities: radiation hx, breast cancer hx are also affecting patient's functional outcome.   REHAB POTENTIAL: Good  CLINICAL DECISION MAKING: Evolving/moderate complexity  EVALUATION COMPLEXITY: Moderate  GOALS: Goals reviewed with patient? Yes  SHORT TERM GOALS=LTGs: Target date: 09/10/23  Pt will obtain day and night compression after maximal reduction to control lymphedema  Baseline: Goal status: INITIAL  2.  Pt will be ind with self MLD or use of a compression pump  Baseline:  Goal status: INITIAL  3.  Pt will be ind with stretches for shoulder ROM and mobility  Baseline:   Goal status: INITIAL  PLAN:  PT FREQUENCY: 3x/week  PT DURATION: 8 weeks  PLANNED INTERVENTIONS: Therapeutic exercises, Therapeutic activity, Neuromuscular re-education, Balance training, Gait training, Patient/Family education, Self Care, and Joint mobilization  PLAN FOR NEXT SESSION: Velcro garment education and fit - appts as needed.  Entered order into [facility] 09/05/23 Had to request new prescription 09/05/23   Margurete Guaman R, PT 08/27/2023, 5:00 PM   PHYSICAL THERAPY DISCHARGE SUMMARY  Visits from Start of Care: 2  Current functional level related to goals / functional outcomes: Pt wanting to pursue home velcro use    Plan: Patient agrees to discharge.

## 2023-08-29 ENCOUNTER — Encounter: Payer: Medicare Other | Admitting: Rehabilitation

## 2023-08-29 ENCOUNTER — Other Ambulatory Visit: Payer: Self-pay | Admitting: Internal Medicine

## 2023-08-30 ENCOUNTER — Ambulatory Visit: Payer: Medicare Other | Attending: Cardiology | Admitting: *Deleted

## 2023-08-30 DIAGNOSIS — I639 Cerebral infarction, unspecified: Secondary | ICD-10-CM | POA: Diagnosis not present

## 2023-08-30 DIAGNOSIS — I480 Paroxysmal atrial fibrillation: Secondary | ICD-10-CM

## 2023-08-30 DIAGNOSIS — Z7901 Long term (current) use of anticoagulants: Secondary | ICD-10-CM | POA: Diagnosis not present

## 2023-08-30 LAB — POCT INR: INR: 2.5 (ref 2.0–3.0)

## 2023-08-30 NOTE — Patient Instructions (Signed)
Description   Continue taking warfarin 1 tablet daily except for 1.5 tablets on Sundays. Recheck INR in 5 weeks.Coumadin Clinic [phone]

## 2023-08-31 ENCOUNTER — Ambulatory Visit: Payer: Self-pay | Admitting: Surgery

## 2023-08-31 ENCOUNTER — Encounter: Payer: Medicare Other | Admitting: Rehabilitation

## 2023-09-03 ENCOUNTER — Encounter: Payer: Medicare Other | Admitting: Physical Therapy

## 2023-09-05 ENCOUNTER — Encounter: Payer: Medicare Other | Admitting: Physical Therapy

## 2023-09-07 ENCOUNTER — Encounter: Payer: Medicare Other | Admitting: Rehabilitation

## 2023-09-11 ENCOUNTER — Ambulatory Visit (INDEPENDENT_AMBULATORY_CARE_PROVIDER_SITE_OTHER): Payer: Medicare Other

## 2023-09-11 DIAGNOSIS — I428 Other cardiomyopathies: Secondary | ICD-10-CM

## 2023-09-13 LAB — CUP PACEART REMOTE DEVICE CHECK
Battery Remaining Longevity: 48 mo
Battery Remaining Percentage: 50 %
Battery Voltage: 2.98 V
Brady Statistic AP VP Percent: 10 %
Brady Statistic AP VS Percent: 1 %
Brady Statistic AS VP Percent: 89 %
Brady Statistic AS VS Percent: 1 %
Brady Statistic RA Percent Paced: 10 %
Date Time Interrogation Session: 20241112034348
Implantable Lead Connection Status: 753985
Implantable Lead Connection Status: 753985
Implantable Lead Connection Status: 753985
Implantable Lead Implant Date: 20080307
Implantable Lead Implant Date: 20080307
Implantable Lead Implant Date: 20080307
Implantable Lead Location: 753858
Implantable Lead Location: 753859
Implantable Lead Location: 753860
Implantable Lead Model: 7001
Implantable Pulse Generator Implant Date: 20210216
Lead Channel Impedance Value: 340 Ohm
Lead Channel Impedance Value: 590 Ohm
Lead Channel Impedance Value: 780 Ohm
Lead Channel Pacing Threshold Amplitude: 0.5 V
Lead Channel Pacing Threshold Amplitude: 0.75 V
Lead Channel Pacing Threshold Amplitude: 0.75 V
Lead Channel Pacing Threshold Pulse Width: 0.5 ms
Lead Channel Pacing Threshold Pulse Width: 0.5 ms
Lead Channel Pacing Threshold Pulse Width: 0.8 ms
Lead Channel Sensing Intrinsic Amplitude: 12 mV
Lead Channel Sensing Intrinsic Amplitude: 5 mV
Lead Channel Setting Pacing Amplitude: 1.5 V
Lead Channel Setting Pacing Amplitude: 1.75 V
Lead Channel Setting Pacing Amplitude: 2 V
Lead Channel Setting Pacing Pulse Width: 0.5 ms
Lead Channel Setting Pacing Pulse Width: 0.8 ms
Lead Channel Setting Sensing Sensitivity: 2 mV
Pulse Gen Model: 3222
Pulse Gen Serial Number: 9105249

## 2023-10-01 ENCOUNTER — Ambulatory Visit: Payer: Medicare Other | Attending: Internal Medicine

## 2023-10-01 DIAGNOSIS — Z45018 Encounter for adjustment and management of other part of cardiac pacemaker: Secondary | ICD-10-CM

## 2023-10-01 DIAGNOSIS — I5032 Chronic diastolic (congestive) heart failure: Secondary | ICD-10-CM | POA: Diagnosis not present

## 2023-10-03 NOTE — Progress Notes (Signed)
EPIC Encounter for ICM Monitoring  Patient Name: Danielle Mckinney is a 72 y.o. female Date: 10/03/2023 Primary Care Physican: Shon Hale, MD Primary Cardiologist: Croitoru Electrophysiologist: Rae Roam Pacing:  >99%   05/31/2023 Office Weight: 190 lbs   AT/AF Burden: 0%    Spoke with patient and heart failure questions reviewed.  Transmission results reviewed.  Pt asymptomatic for fluid accumulation.  Reports feeling well at this time and voices no complaints.     Corvue Thoracic impedance suggesting normal fluid levels within the last month.    Prescribed:  Furosemide 40 mg Take 40 mg by mouth daily as needed for edema.  Takes a few days a month.  Potassium 20 mEq Take 20 mEq by mouth daily as needed (when taking furosemide).  Spironolactone 25 mg take 1 tablet daily Warfarin as directed   Labs: 07/18/2022 Creatinine 0.81, BUN 16, Potassium 4.1, Sodium 139, GFR 79 A complete set of results can be found in Results Review.   Recommendations:  No changes and encouraged to call if experiencing any fluid symptoms.   Follow-up plan: ICM clinic phone appointment on 11/05/2023.   91 day device clinic remote transmission 12/11/2023.     EP/Cardiology Office Visits:   Recall 05/25/2024 with Dr Crotioru.  Recall 07/28/2023 with Dr Ladona Ridgel.   Copy of ICM check sent to Dr. Ladona Ridgel.     3 month ICM trend: 10/01/2023.    12-14 Month ICM trend:     Karie Soda, RN 10/03/2023 4:40 PM

## 2023-10-09 ENCOUNTER — Ambulatory Visit: Payer: Medicare Other | Attending: Cardiovascular Disease | Admitting: *Deleted

## 2023-10-09 DIAGNOSIS — I480 Paroxysmal atrial fibrillation: Secondary | ICD-10-CM

## 2023-10-09 DIAGNOSIS — I639 Cerebral infarction, unspecified: Secondary | ICD-10-CM | POA: Diagnosis not present

## 2023-10-09 DIAGNOSIS — Z7901 Long term (current) use of anticoagulants: Secondary | ICD-10-CM | POA: Diagnosis not present

## 2023-10-09 LAB — POCT INR: INR: 1.6 — AB (ref 2.0–3.0)

## 2023-10-09 NOTE — Progress Notes (Signed)
Remote ICD transmission.   

## 2023-10-09 NOTE — Patient Instructions (Signed)
Description   Today take 1.5 tablets of warfarin then continue taking warfarin 1 tablet daily except for 1.5 tablets on Sundays. Recheck INR in 5 weeks.Coumadin Clinic [phone]

## 2023-11-05 ENCOUNTER — Ambulatory Visit: Payer: Medicare Other | Attending: Internal Medicine

## 2023-11-05 DIAGNOSIS — I5032 Chronic diastolic (congestive) heart failure: Secondary | ICD-10-CM

## 2023-11-05 DIAGNOSIS — Z45018 Encounter for adjustment and management of other part of cardiac pacemaker: Secondary | ICD-10-CM | POA: Diagnosis not present

## 2023-11-07 NOTE — Progress Notes (Signed)
 EPIC Encounter for ICM Monitoring  Patient Name: Danielle Mckinney is a 73 y.o. female Date: 11/07/2023 Primary Care Physican: Chrystal Lamarr GORMAN, MD Primary Cardiologist: Croitoru Electrophysiologist: Waddell Pore Pacing:  >99%   05/31/2023 Office Weight: 190 lbs   AT/AF Burden: 0%    Spoke with patient and heart failure questions reviewed.  Transmission results reviewed.  Pt asymptomatic for fluid accumulation but is recovering from pneumonia.  She continues to have a cough and is taking antibiotics.     Corvue Thoracic impedance suggesting normal fluid levels with the exception of possible fluid accumulation 12/14-12/19 and 1/3-1/5.   Prescribed:  Furosemide  40 mg Take 40 mg by mouth daily as needed for edema.  Takes a few days a month.  Potassium 20 mEq Take 20 mEq by mouth daily as needed (when taking furosemide ).  Spironolactone  25 mg take 1 tablet daily Warfarin as directed   Labs: 07/18/2022 Creatinine 0.81, BUN 16, Potassium 4.1, Sodium 139, GFR 79 A complete set of results can be found in Results Review.   Recommendations:  No changes and encouraged to call if experiencing any fluid symptoms.   Follow-up plan: ICM clinic phone appointment on 12/10/2023.   91 day device clinic remote transmission 12/11/2023.     EP/Cardiology Office Visits:   Recall 05/25/2024 with Dr Crotioru.  Recall 07/28/2023 with Dr Waddell.   Copy of ICM check sent to Dr. Waddell.     3 month ICM trend: 11/05/2023.    12-14 Month ICM trend:     [name], RN 11/07/2023 4:43 PM

## 2023-11-08 ENCOUNTER — Other Ambulatory Visit: Payer: Self-pay | Admitting: Cardiovascular Disease

## 2023-11-08 DIAGNOSIS — I4891 Unspecified atrial fibrillation: Secondary | ICD-10-CM

## 2023-11-08 NOTE — Telephone Encounter (Signed)
 Warfarin 4mg  refill Afib Last INR 10/09/23 Last OV 05/31/23

## 2023-11-15 ENCOUNTER — Ambulatory Visit: Payer: Medicare Other

## 2023-11-27 ENCOUNTER — Ambulatory Visit: Payer: Medicare Other | Attending: Cardiology | Admitting: *Deleted

## 2023-11-27 DIAGNOSIS — Z7901 Long term (current) use of anticoagulants: Secondary | ICD-10-CM

## 2023-11-27 DIAGNOSIS — I480 Paroxysmal atrial fibrillation: Secondary | ICD-10-CM | POA: Diagnosis not present

## 2023-11-27 LAB — POCT INR: INR: 3 (ref 2.0–3.0)

## 2023-11-27 NOTE — Patient Instructions (Addendum)
Description   Do not take any warfarin today then continue taking warfarin 1 tablet daily except for 1.5 tablets on Sundays. Recheck INR in 3 weeks.Coumadin Clinic [phone]

## 2023-12-10 ENCOUNTER — Ambulatory Visit: Payer: Medicare Other | Attending: Internal Medicine

## 2023-12-10 DIAGNOSIS — Z45018 Encounter for adjustment and management of other part of cardiac pacemaker: Secondary | ICD-10-CM

## 2023-12-10 DIAGNOSIS — I5032 Chronic diastolic (congestive) heart failure: Secondary | ICD-10-CM

## 2023-12-11 ENCOUNTER — Ambulatory Visit (INDEPENDENT_AMBULATORY_CARE_PROVIDER_SITE_OTHER): Payer: Medicare Other

## 2023-12-11 DIAGNOSIS — I5032 Chronic diastolic (congestive) heart failure: Secondary | ICD-10-CM

## 2023-12-11 DIAGNOSIS — I428 Other cardiomyopathies: Secondary | ICD-10-CM

## 2023-12-14 LAB — CUP PACEART REMOTE DEVICE CHECK
Battery Remaining Longevity: 46 mo
Battery Remaining Percentage: 47 %
Battery Voltage: 2.98 V
Brady Statistic AP VP Percent: 10 %
Brady Statistic AP VS Percent: 1 %
Brady Statistic AS VP Percent: 90 %
Brady Statistic AS VS Percent: 1 %
Brady Statistic RA Percent Paced: 9.6 %
Date Time Interrogation Session: 20250212164021
Implantable Lead Connection Status: 753985
Implantable Lead Connection Status: 753985
Implantable Lead Connection Status: 753985
Implantable Lead Implant Date: 20080307
Implantable Lead Implant Date: 20080307
Implantable Lead Implant Date: 20080307
Implantable Lead Location: 753858
Implantable Lead Location: 753859
Implantable Lead Location: 753860
Implantable Lead Model: 7001
Implantable Pulse Generator Implant Date: 20210216
Lead Channel Impedance Value: 340 Ohm
Lead Channel Impedance Value: 610 Ohm
Lead Channel Impedance Value: 710 Ohm
Lead Channel Pacing Threshold Amplitude: 0.5 V
Lead Channel Pacing Threshold Amplitude: 0.75 V
Lead Channel Pacing Threshold Amplitude: 0.75 V
Lead Channel Pacing Threshold Pulse Width: 0.5 ms
Lead Channel Pacing Threshold Pulse Width: 0.5 ms
Lead Channel Pacing Threshold Pulse Width: 0.8 ms
Lead Channel Sensing Intrinsic Amplitude: 12 mV
Lead Channel Sensing Intrinsic Amplitude: 5 mV
Lead Channel Setting Pacing Amplitude: 1.5 V
Lead Channel Setting Pacing Amplitude: 1.75 V
Lead Channel Setting Pacing Amplitude: 2 V
Lead Channel Setting Pacing Pulse Width: 0.5 ms
Lead Channel Setting Pacing Pulse Width: 0.8 ms
Lead Channel Setting Sensing Sensitivity: 2 mV
Pulse Gen Model: 3222
Pulse Gen Serial Number: 9105249

## 2023-12-14 NOTE — Progress Notes (Signed)
EPIC Encounter for ICM Monitoring  Patient Name: Danielle Mckinney is a 73 y.o. female Date: 12/14/2023 Primary Care Physican: Shon Hale, MD Primary Cardiologist: Croitoru Electrophysiologist: Rae Roam Pacing:  >99%   05/31/2023 Office Weight: 190 lbs 12/14/2023 Weight: 188-190 lbs   AT/AF Burden: 0%    Spoke with patient and heart failure questions reviewed.  Transmission results reviewed.  Pt asymptomatic for fluid accumulation.  Reports feeling well at this time and voices no complaints.     Corvue Thoracic impedance suggesting normal fluid levels within the last month.   Prescribed:  Furosemide 40 mg Take 40 mg by mouth daily as needed for edema.  Takes a few days a month.  Potassium 20 mEq Take 20 mEq by mouth daily as needed (when taking furosemide).  Spironolactone 25 mg take 1 tablet daily Warfarin as directed   Labs: 07/18/2022 Creatinine 0.81, BUN 16, Potassium 4.1, Sodium 139, GFR 79 A complete set of results can be found in Results Review.   Recommendations:  No changes and encouraged to call if experiencing any fluid symptoms.   Follow-up plan: ICM clinic phone appointment on 01/14/2024.   91 day device clinic remote transmission 03/11/2024.     EP/Cardiology Office Visits:   Recall 05/25/2024 with Dr Crotioru.  Recall 07/28/2023 with Dr Ladona Ridgel.   Copy of ICM check sent to Dr. Ladona Ridgel.     3 month ICM trend: 12/12/2023.    12-14 Month ICM trend:     Karie Soda, RN 12/14/2023 8:14 AM

## 2023-12-16 ENCOUNTER — Encounter: Payer: Self-pay | Admitting: Internal Medicine

## 2023-12-18 ENCOUNTER — Ambulatory Visit: Payer: Medicare Other | Attending: Cardiovascular Disease

## 2023-12-18 DIAGNOSIS — I480 Paroxysmal atrial fibrillation: Secondary | ICD-10-CM | POA: Diagnosis not present

## 2023-12-18 DIAGNOSIS — Z7901 Long term (current) use of anticoagulants: Secondary | ICD-10-CM

## 2023-12-18 LAB — POCT INR: INR: 3.7 — AB (ref 2.0–3.0)

## 2023-12-18 NOTE — Patient Instructions (Signed)
 Do not take any warfarin tonight then continue taking warfarin 1 tablet daily except for 1.5 tablets on Sundays. Recheck INR in 3 weeks.Coumadin Clinic [phone]; Eat 1-2 helpings of greens per week.

## 2023-12-18 NOTE — Progress Notes (Deleted)
 74 y.o. [id number] Divorced [place] American female here for a breast and pelvic exam.    The patient is also followed for ***.  PCP: Shon Hale, MD   No LMP recorded. Patient is postmenopausal.           Sexually active: Yes.    The current method of family planning is post menopausal status.    Menopausal hormone therapy:  n/a Exercising: {yes no:314532}  {types:19826} Smoker:  no  OB History     Gravida  3   Para  2   Term  2   Preterm      AB  1   Living  2      SAB      IAB  1   Ectopic      Multiple      Live Births              HEALTH MAINTENANCE: Last 2 paps: 10/12/22 LSIL, 10/11/21 neg History of abnormal Pap or positive HPV:  {YES NO:22349} Mammogram:  bilateral mastectomy per ML note Colonoscopy:   Bone Density:  10/26/20  Result  normal   Immunization History  Administered Date(s) Administered   Zoster Recombinant(Shingrix) 01/08/2018, 06/06/2018      reports that she has never smoked. She has never used smokeless tobacco. She reports that she does not drink alcohol and does not use drugs.  Past Medical History:  Diagnosis Date   Atrial fibrillation [facility])    Automatic implantable cardioverter-defibrillator in situ 08/25/2009   Qualifier: Diagnosis of  By: Ladona Ridgel, MD, [facility], [place]ergia Alcon     Biventricular ICD (implantable cardioverter-defibrillator) in place    [employer]   Breast cancer [facility])    1989, 1998   Cancer (HCC)    Breast cancer-BRACA I gene   CHF 08/18/2009   Qualifier: Diagnosis of  By: Oswald Hillock     CHF (congestive heart failure) (HCC)    Chronic diastolic CHF (congestive heart failure) (HCC) 04/23/2012   CIN I (cervical intraepithelial neoplasia I)    CVA (cerebral vascular accident) (HCC) 01/15/2013   Essential hypertension 08/18/2009   Qualifier: Diagnosis of  By: Oswald Hillock     Fibroid    Fibroid uterus 04/15/2013   Genetic testing 07/06/2015   BRCA1 c.191G>A (C64Y) pathogenic  mutation found at [employer] at [facility].  The report date is January 21, 1998.    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   Stroke [facility])     Past Surgical History:  Procedure Laterality Date   BIV ICD GENERTAOR CHANGE OUT  12/20/2011   [employer]   BIV ICD GENERTAOR CHANGE OUT N/A 12/20/2011   Procedure: BIV ICD GENERTAOR CHANGE OUT;  Surgeon: Marinus Maw, MD;  Location: [facility] CATH LAB;  Service: Cardiovascular;  Laterality: N/A;   BIV PACEMAKER GENERATOR CHANGEOUT N/A 12/16/2019   Procedure: DOWNGRADE TO BIV PACEMAKER;  Surgeon: Marinus Maw, MD;  Location: [facility] INVASIVE CV LAB;  Service: Cardiovascular;  Laterality: N/A;   BREAST SURGERY     Bilateral mastctomy and reconstruction TRAM   CARDIAC DEFIBRILLATOR PLACEMENT     CERVICAL CONE BIOPSY     COLPOSCOPY     GYNECOLOGIC CRYOSURGERY     MASTECTOMY     OOPHORECTOMY     BSO   TRANSTHORACIC ECHOCARDIOGRAM  08/14/2006   TUBAL  LIGATION     US ECHOCARDIOGRAPHY  06/25/2012   borderline concentric LVH,LV systolic fx mildly reduced,impaired LV relaxation    Current Outpatient Medications  Medication Sig Dispense Refill   Calcium-Magnesium-Vitamin D (CALCIUM 1200+D3 PO) Take 1 tablet by mouth daily.      carvedilol (COREG) 6.25 MG tablet Take 1.5 tablets (9.375 mg total) by mouth 2 (two) times daily with a meal. 270 tablet 3   Cholecalciferol 25 MCG (1000 UT) tablet Take 1,000 Units by mouth daily.     Flaxseed, Linseed, (FLAX SEED OIL) 1000 MG CAPS Take 1 capsule by mouth daily at 6 (six) AM.     furosemide (LASIX) 40 MG tablet TAKE 1 TABLET BY MOUTH EVERY DAY AS NEEDED FOR SWELLING 90 tablet 1   Krill Oil 500 MG CAPS Take 500 mg by mouth daily.     losartan (COZAAR) 100 MG tablet TAKE 1/2 TABLET(50 MG) BY MOUTH TWICE DAILY 90 tablet 2   meclizine (ANTIVERT) 25 MG tablet Take 25 mg by mouth 3  (three) times daily as needed for dizziness.     OVER THE COUNTER MEDICATION Take 1 tablet by mouth daily. Olive Leaf and Oregano     Polyethyl Glycol-Propyl Glycol (SYSTANE OP) Place 1 drop into both eyes 2 (two) times daily.     potassium chloride SA (KLOR-CON) 20 MEQ tablet TAKE 1 TABLET BY MOUTH DAILY IF NEEDED(TAKE ALONG WITH FUROSEMIDE WHEN NEEDED) 30 tablet 3   pyridOXINE (VITAMIN B-6) 100 MG tablet Take 100 mg by mouth daily.     rosuvastatin (CRESTOR) 10 MG tablet Take 10 mg by mouth daily.     spironolactone (ALDACTONE) 25 MG tablet Take 1 tablet (25 mg total) by mouth daily. TAKE 1 TABLET(25 MG) BY MOUTH DAILY 90 tablet 3   valACYclovir (VALTREX) 500 MG tablet Take 500 mg by mouth as needed.     warfarin (COUMADIN) 4 MG tablet TAKE 1 TO 1 AND 1/2 TABLETS BY MOUTH DAILY AS DIRECTED BY COUMADIN CLINIC 100 tablet 0   No current facility-administered medications for this visit.    ALLERGIES: Codeine, Hydrocodone-acetaminophen, Lisinopril, Oxycodone, Penicillins, and Sulfa antibiotics  Family History  Problem Relation Age of Onset   Breast cancer Sister        Age 60   Hypertension Sister    Heart disease Maternal Grandfather    Clotting disorder Mother    Cirrhosis Father    Coronary artery disease Other    Hypertension Brother     Review of Systems  PHYSICAL EXAM:  There were no vitals taken for this visit.    General appearance: alert, cooperative and appears stated age Head: normocephalic, without obvious abnormality, atraumatic Neck: no adenopathy, supple, symmetrical, trachea midline and thyroid normal to inspection and palpation Lungs: clear to auscultation bilaterally Breasts: normal appearance, no masses or tenderness, No nipple retraction or dimpling, No nipple discharge or bleeding, No axillary adenopathy Heart: regular rate and rhythm Abdomen: soft, non-tender; no masses, no organomegaly Extremities: extremities normal, atraumatic, no cyanosis or edema Skin:  skin color, texture, turgor normal. No rashes or lesions Lymph nodes: cervical, supraclavicular, and axillary nodes normal. Neurologic: grossly normal  Pelvic: External genitalia:  no lesions              No abnormal inguinal nodes palpated.              Urethra:  normal appearing urethra with no masses, tenderness or lesions  Bartholins and Skenes: normal                 Vagina: normal appearing vagina with normal color and discharge, no lesions              Cervix: no lesions              Pap taken: {yes no:314532} Bimanual Exam:  Uterus:  normal size, contour, position, consistency, mobility, non-tender              Adnexa: no mass, fullness, tenderness              Rectal exam: {yes no:314532}.  Confirms.              Anus:  normal sphincter tone, no lesions  Chaperone was present for exam:  {BSCHAPERONE:31226::"Caelyn Route F, CMA"}  ASSESSMENT: Encounter for breast and pelvic exam.   ***  PLAN: Mammogram screening discussed. Self breast awareness reviewed. Pap and HRV collected:  {yes no:314532} Guidelines for Calcium, Vitamin D, regular exercise program including cardiovascular and weight bearing exercise. Medication refills:  *** {LABS (Optional):23779} Follow up:  ***    Additional counseling given.  {yes [id number]. ***  total time was spent for this patient encounter, including preparation, face-to-face counseling with the patient, coordination of care, and documentation of the encounter in addition to doing the breast and pelvic exam.

## 2024-01-01 ENCOUNTER — Encounter: Payer: Medicare Other | Admitting: Obstetrics and Gynecology

## 2024-01-08 ENCOUNTER — Ambulatory Visit: Payer: Medicare Other

## 2024-01-14 ENCOUNTER — Ambulatory Visit: Payer: Medicare Other | Attending: Internal Medicine

## 2024-01-14 DIAGNOSIS — I5032 Chronic diastolic (congestive) heart failure: Secondary | ICD-10-CM | POA: Diagnosis not present

## 2024-01-14 DIAGNOSIS — Z45018 Encounter for adjustment and management of other part of cardiac pacemaker: Secondary | ICD-10-CM | POA: Diagnosis not present

## 2024-01-14 NOTE — Progress Notes (Signed)
 EPIC Encounter for ICM Monitoring  Patient Name: Danielle Mckinney is a 73 y.o. female Date: 01/14/2024 Primary Care Physican: Shon Hale, MD Primary Cardiologist: Croitoru Electrophysiologist: Rae Roam Pacing:  >99%   05/31/2023 Office Weight: 190 lbs 12/14/2023 Weight: 188-190 lbs   AT/AF Burden: 0%    Spoke with patient and heart failure questions reviewed.  Transmission results reviewed.  Pt reports slight SOB in the last couple of days.     Corvue Thoracic impedance suggesting possible fluid accumulation starting 3/13 and trending back toward baseline.   Prescribed:  Furosemide 40 mg Take 40 mg by mouth daily as needed for edema.  Takes a few days a month.  Potassium 20 mEq Take 20 mEq by mouth daily as needed (when taking furosemide).  Spironolactone 25 mg take 1 tablet daily Warfarin as directed   Labs: 07/18/2022 Creatinine 0.81, BUN 16, Potassium 4.1, Sodium 139, GFR 79 A complete set of results can be found in Results Review.   Recommendations:  She will take PRN Lasix & Potassium for 1-2 days and then return to PRN.     Follow-up plan: ICM clinic phone appointment on 02/18/2024.   91 day device clinic remote transmission 03/11/2024.     EP/Cardiology Office Visits:   Advised to call Dr Bruna Potter office and provided EP scheduler number.  Recall 05/25/2024 with Dr Crotioru.  Recall 07/28/2023 with Dr Ladona Ridgel.   Copy of ICM check sent to Dr. Ladona Ridgel.     3 month ICM trend: 01/14/2024.    12-14 Month ICM trend:     Karie Soda, RN 01/14/2024 12:04 PM

## 2024-01-15 ENCOUNTER — Encounter

## 2024-01-15 ENCOUNTER — Ambulatory Visit: Attending: Internal Medicine

## 2024-01-15 DIAGNOSIS — I480 Paroxysmal atrial fibrillation: Secondary | ICD-10-CM | POA: Diagnosis not present

## 2024-01-15 DIAGNOSIS — Z7901 Long term (current) use of anticoagulants: Secondary | ICD-10-CM | POA: Diagnosis not present

## 2024-01-15 LAB — POCT INR: INR: 3.2 — AB (ref 2.0–3.0)

## 2024-01-15 NOTE — Patient Instructions (Signed)
 Do not take any warfarin tonight then continue taking warfarin 1 tablet daily except for 1.5 tablets on Sundays. Recheck INR in 3 weeks.Coumadin Clinic [phone]; Eat 1-2 helpings of greens per week.

## 2024-01-22 NOTE — Progress Notes (Signed)
 Remote ICD transmission.

## 2024-01-22 NOTE — Addendum Note (Signed)
 Addended by: Geralyn Flash D on: 01/22/2024 10:12 AM   Modules accepted: Orders

## 2024-02-05 ENCOUNTER — Ambulatory Visit

## 2024-02-07 ENCOUNTER — Ambulatory Visit: Attending: Cardiovascular Disease

## 2024-02-07 DIAGNOSIS — I480 Paroxysmal atrial fibrillation: Secondary | ICD-10-CM | POA: Diagnosis not present

## 2024-02-07 LAB — POCT INR: INR: 2.9 (ref 2.0–3.0)

## 2024-02-07 NOTE — Patient Instructions (Addendum)
 Description   Only take 1/2 tablet today and then START taking warfarin 1 tablet daily.  Eat 1-2 helpings of greens per week. Recheck INR in 3 weeks. Coumadin Clinic [phone]

## 2024-02-18 ENCOUNTER — Ambulatory Visit: Attending: Internal Medicine

## 2024-02-18 DIAGNOSIS — I5032 Chronic diastolic (congestive) heart failure: Secondary | ICD-10-CM

## 2024-02-18 DIAGNOSIS — Z45018 Encounter for adjustment and management of other part of cardiac pacemaker: Secondary | ICD-10-CM

## 2024-02-18 NOTE — Progress Notes (Unsigned)
 EPIC Encounter for ICM Monitoring  Patient Name: SOCORRO KANITZ is a 73 y.o. female Date: 02/18/2024 Primary Care Physican: Ransom Byers, MD Primary Cardiologist: Croitoru Electrophysiologist: Arvid Latino Pacing:  >99%   05/31/2023 Office Weight: 190 lbs 12/14/2023 Weight: 188-190 lbs 02/18/2024 Weight: Unknown lbs   AT/AF Burden: 0%    Attempted call to patient and unable to reach.  Left detailed message per DPR regarding transmission.  Transmission results reviewed.    Corvue Thoracic impedance suggesting possible fluid accumulation starting 4/18 and trending back toward baseline.  Also suggesting possible fluid accumulation from 4/11-4/14.   Prescribed:  Furosemide  40 mg Take 40 mg by mouth daily as needed for edema.  Takes a few days a month.  Potassium 20 mEq Take 20 mEq by mouth daily as needed (when taking furosemide ).  Spironolactone  25 mg take 1 tablet daily Warfarin as directed   Labs: 07/18/2022 Creatinine 0.81, BUN 16, Potassium 4.1, Sodium 139, GFR 79 A complete set of results can be found in Results Review.   Recommendations:   Left voice mail with ICM number and encouraged to call if experiencing any fluid symptoms.     Follow-up plan: ICM clinic phone appointment on 02/25/2024 to recheck fluid levels.   91 day device clinic remote transmission 03/11/2024.     EP/Cardiology Office Visits:   03/06/2024 with Mertha Abrahams, PA  Recall 05/25/2024 with Dr Crotioru.  Recall 07/28/2023 with Dr Carolynne Citron.   Copy of ICM check sent to Dr. Carolynne Citron.    3 month ICM trend: 02/18/2024.    12-14 Month ICM trend:     Almyra Jain, RN 02/18/2024 11:12 AM

## 2024-02-19 ENCOUNTER — Telehealth: Payer: Self-pay

## 2024-02-19 NOTE — Telephone Encounter (Signed)
 Remote ICM transmission received.  Attempted call to patient regarding ICM remote transmission and left detailed message per DPR.  Left ICM phone number and advised to return call for any fluid symptoms or questions. Next ICM remote transmission scheduled 02/25/2024.

## 2024-02-25 ENCOUNTER — Ambulatory Visit: Attending: Internal Medicine

## 2024-02-25 DIAGNOSIS — I5032 Chronic diastolic (congestive) heart failure: Secondary | ICD-10-CM

## 2024-02-25 DIAGNOSIS — Z45018 Encounter for adjustment and management of other part of cardiac pacemaker: Secondary | ICD-10-CM

## 2024-02-26 ENCOUNTER — Encounter: Payer: Self-pay | Admitting: Obstetrics and Gynecology

## 2024-02-26 NOTE — Progress Notes (Unsigned)
 73 y.o. [id number] Divorced [place] American female here for a breast and pelvic exam. Former ML pt  The patient is also followed for ***.  PCP: Ransom Byers, MD   No LMP recorded. Patient is postmenopausal.           Sexually active: {yes no:314532}  The current method of family planning is post menopausal status.    Menopausal hormone therapy:  *** Exercising: {yes no:314532}  {types:19826} Smoker:  no  OB History     Gravida  3   Para  2   Term  2   Preterm      AB  1   Living  2      SAB      IAB  1   Ectopic      Multiple      Live Births              HEALTH MAINTENANCE: Last 2 paps: 2022-WNL, 10/12/2022-LGSIL History of abnormal Pap or positive HPV: yes, hx of LEEP in 2010, most recent in 2023-LGSIL, Colpo CIN 1 Mammogram: S/p B/L mastectomy, BRCA 1+ Colonoscopy: 2020, Due? Bone Density: 10/26/2020  Result WNL   Immunization History  Administered Date(s) Administered   Zoster Recombinant(Shingrix) 01/08/2018, 06/06/2018      reports that she has never smoked. She has never used smokeless tobacco. She reports that she does not drink alcohol and does not use drugs.  Past Medical History:  Diagnosis Date   Atrial fibrillation [facility])    Automatic implantable cardioverter-defibrillator in situ 08/25/2009   Qualifier: Diagnosis of  By: Carolynne Citron, MD, [facility], [name]     Biventricular ICD (implantable cardioverter-defibrillator) in place    [employer]   Breast cancer [facility])    1989, 1998   Cancer (HCC)    Breast cancer-BRACA I gene   CHF 08/18/2009   Qualifier: Diagnosis of  By: [name]     CHF (congestive heart failure) (HCC)    Chronic diastolic CHF (congestive heart failure) (HCC) 04/23/2012   CIN I (cervical intraepithelial neoplasia I)    CVA (cerebral vascular accident) (HCC) 01/15/2013   Essential hypertension 08/18/2009   Qualifier: Diagnosis of  By: [name]     Fibroid    Fibroid uterus 04/15/2013    Genetic testing 07/06/2015   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  The report date is January 21, 1998.    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   Stroke [facility])     Past Surgical History:  Procedure Laterality Date   BIV ICD GENERTAOR CHANGE OUT  12/20/2011   [employer]   BIV ICD GENERTAOR CHANGE OUT N/A 12/20/2011   Procedure: BIV ICD GENERTAOR CHANGE OUT;  Surgeon: [name], MD;  Location: [facility] CATH LAB;  Service: Cardiovascular;  Laterality: N/A;   BIV PACEMAKER GENERATOR CHANGEOUT N/A 12/16/2019   Procedure: DOWNGRADE TO BIV PACEMAKER;  Surgeon: [name], MD;  Location: [facility] INVASIVE CV LAB;  Service: Cardiovascular;  Laterality: N/A;   BREAST SURGERY     Bilateral mastctomy and reconstruction TRAM   CARDIAC DEFIBRILLATOR PLACEMENT     CERVICAL CONE BIOPSY     COLPOSCOPY     GYNECOLOGIC CRYOSURGERY     MASTECTOMY     OOPHORECTOMY     BSO   TRANSTHORACIC ECHOCARDIOGRAM  08/14/2006   TUBAL LIGATION     US  ECHOCARDIOGRAPHY  06/25/2012   borderline concentric LVH,LV systolic fx mildly reduced,impaired LV relaxation    Current Outpatient Medications  Medication Sig Dispense Refill   Calcium-Magnesium-Vitamin D (CALCIUM 1200+D3 PO) Take 1 tablet by mouth daily.      carvedilol  (COREG ) 6.25 MG tablet Take 1.5 tablets (9.375 mg total) by mouth 2 (two) times daily with a meal. 270 tablet 3   Cholecalciferol 25 MCG (1000 UT) tablet Take 1,000 Units by mouth daily.     Flaxseed, Linseed, (FLAX SEED OIL) 1000 MG CAPS Take 1 capsule by mouth daily at 6 (six) AM.     furosemide  (LASIX ) 40 MG tablet TAKE 1 TABLET BY MOUTH EVERY DAY AS NEEDED FOR SWELLING 90 tablet 1   Krill Oil 500 MG CAPS Take 500 mg by mouth daily.     losartan  (COZAAR ) 100 MG tablet TAKE 1/2 TABLET(50 MG) BY MOUTH TWICE DAILY 90  tablet 2   meclizine (ANTIVERT) 25 MG tablet Take 25 mg by mouth 3 (three) times daily as needed for dizziness.     OVER THE COUNTER MEDICATION Take 1 tablet by mouth daily. Olive Leaf and Oregano     Polyethyl Glycol-Propyl Glycol (SYSTANE OP) Place 1 drop into both eyes 2 (two) times daily.     potassium chloride  SA (KLOR-CON ) 20 MEQ tablet TAKE 1 TABLET BY MOUTH DAILY IF NEEDED(TAKE ALONG WITH FUROSEMIDE  WHEN NEEDED) 30 tablet 3   pyridOXINE (VITAMIN B-6) 100 MG tablet Take 100 mg by mouth daily.     rosuvastatin (CRESTOR) 10 MG tablet Take 10 mg by mouth daily.     spironolactone  (ALDACTONE ) 25 MG tablet Take 1 tablet (25 mg total) by mouth daily. TAKE 1 TABLET(25 MG) BY MOUTH DAILY 90 tablet 3   valACYclovir (VALTREX) 500 MG tablet Take 500 mg by mouth as needed.     warfarin (COUMADIN ) 4 MG tablet TAKE 1 TO 1 AND 1/2 TABLETS BY MOUTH DAILY AS DIRECTED BY COUMADIN  CLINIC 100 tablet 0   No current facility-administered medications for this visit.    ALLERGIES: Codeine, Hydrocodone-acetaminophen , Lisinopril, Oxycodone, Penicillins, and Sulfa antibiotics  Family History  Problem Relation Age of Onset   Breast cancer Sister        Age 80   Hypertension Sister    Heart disease Maternal Grandfather    Clotting disorder Mother    Cirrhosis Father    Coronary artery disease Other    Hypertension Brother     Review of Systems  PHYSICAL EXAM:  There were no vitals taken for this visit.    General appearance: alert, cooperative and appears stated age Head: normocephalic, without obvious abnormality, atraumatic Neck: no adenopathy, supple, symmetrical, trachea midline and thyroid  normal to inspection and palpation Lungs: clear to auscultation bilaterally Breasts: normal appearance, no masses or tenderness, No nipple retraction or dimpling, No nipple discharge or bleeding, No axillary adenopathy Heart: regular rate and rhythm Abdomen: soft, non-tender; no masses, no  organomegaly Extremities: extremities normal, atraumatic, no cyanosis or edema Skin: skin color, texture, turgor normal. No rashes or lesions Lymph nodes: cervical, supraclavicular, and axillary nodes normal. Neurologic: grossly normal  Pelvic: External genitalia:  no lesions              No abnormal inguinal nodes palpated.              Urethra:  normal appearing urethra with no masses, tenderness or lesions  Bartholins and Skenes: normal                 Vagina: normal appearing vagina with normal color and discharge, no lesions              Cervix: no lesions              Pap taken: {yes no:314532} Bimanual Exam:  Uterus:  normal size, contour, position, consistency, mobility, non-tender              Adnexa: no mass, fullness, tenderness              Rectal exam: {yes no:314532}.  Confirms.              Anus:  normal sphincter tone, no lesions  Chaperone was present for exam:  {BSCHAPERONE:31226::"Emily F, CMA"}  ASSESSMENT: Encounter for breast and pelvic exam.   ***  PLAN: Mammogram screening discussed. Self breast awareness reviewed. Pap and HRV collected:  {yes no:314532} Guidelines for Calcium, Vitamin D, regular exercise program including cardiovascular and weight bearing exercise. Medication refills:  *** {LABS (Optional):23779} Follow up:  ***    Additional counseling given.  {yes [id number]. ***  total time was spent for this patient encounter, including preparation, face-to-face counseling with the patient, coordination of care, and documentation of the encounter in addition to doing the breast and pelvic exam.

## 2024-02-27 ENCOUNTER — Other Ambulatory Visit (HOSPITAL_COMMUNITY)
Admission: RE | Admit: 2024-02-27 | Discharge: 2024-02-27 | Disposition: A | Discharge status: Home / Self Care | Source: Ambulatory Visit | Attending: Obstetrics and Gynecology | Admitting: Obstetrics and Gynecology

## 2024-02-27 ENCOUNTER — Encounter: Payer: Self-pay | Admitting: Obstetrics and Gynecology

## 2024-02-27 ENCOUNTER — Ambulatory Visit (INDEPENDENT_AMBULATORY_CARE_PROVIDER_SITE_OTHER): Admitting: Obstetrics and Gynecology

## 2024-02-27 VITALS — BP 122/70 | HR 74 | Ht 62.0 in | Wt 179.0 lb

## 2024-02-27 DIAGNOSIS — R87612 Low grade squamous intraepithelial lesion on cytologic smear of cervix (LGSIL): Secondary | ICD-10-CM

## 2024-02-27 DIAGNOSIS — B009 Herpesviral infection, unspecified: Secondary | ICD-10-CM

## 2024-02-27 DIAGNOSIS — Z114 Encounter for screening for human immunodeficiency virus [HIV]: Secondary | ICD-10-CM

## 2024-02-27 DIAGNOSIS — Z1151 Encounter for screening for human papillomavirus (HPV): Secondary | ICD-10-CM | POA: Diagnosis not present

## 2024-02-27 DIAGNOSIS — Z01419 Encounter for gynecological examination (general) (routine) without abnormal findings: Secondary | ICD-10-CM

## 2024-02-27 DIAGNOSIS — Z1159 Encounter for screening for other viral diseases: Secondary | ICD-10-CM

## 2024-02-27 DIAGNOSIS — Z9289 Personal history of other medical treatment: Secondary | ICD-10-CM

## 2024-02-27 DIAGNOSIS — Z124 Encounter for screening for malignant neoplasm of cervix: Secondary | ICD-10-CM | POA: Diagnosis present

## 2024-02-27 DIAGNOSIS — Z113 Encounter for screening for infections with a predominantly sexual mode of transmission: Secondary | ICD-10-CM | POA: Insufficient documentation

## 2024-02-27 DIAGNOSIS — Z9189 Other specified personal risk factors, not elsewhere classified: Secondary | ICD-10-CM

## 2024-02-27 NOTE — Patient Instructions (Signed)

## 2024-02-28 ENCOUNTER — Ambulatory Visit

## 2024-02-28 LAB — HEPATITIS C ANTIBODY: Hepatitis C Ab: NONREACTIVE

## 2024-02-28 LAB — RPR: RPR Ser Ql: NONREACTIVE

## 2024-02-28 LAB — HIV ANTIBODY (ROUTINE TESTING W REFLEX): HIV 1&2 Ab, 4th Generation: NONREACTIVE

## 2024-02-29 NOTE — Progress Notes (Signed)
 EPIC Encounter for ICM Monitoring  Patient Name: Danielle Mckinney is a 73 y.o. female Date: 02/29/2024 Primary Care Physican: Ransom Byers, MD Primary Cardiologist: Croitoru Electrophysiologist: Arvid Latino Pacing:  >99%   05/31/2023 Office Weight: 190 lbs 12/14/2023 Weight: 188-190 lbs 02/27/2024 Office Weight: 179 lbs   AT/AF Burden: 0%    Transmission results reviewed.    Corvue Thoracic impedance suggesting fluid levels returned to normal.   Prescribed:  Furosemide  40 mg Take 40 mg by mouth daily as needed for edema.  Takes a few days a month.  Potassium 20 mEq Take 20 mEq by mouth daily as needed (when taking furosemide ).  Spironolactone  25 mg take 1 tablet daily   Labs: 07/18/2022 Creatinine 0.81, BUN 16, Potassium 4.1, Sodium 139, GFR 79 A complete set of results can be found in Results Review.   Recommendations:   No changes.   Follow-up plan: ICM clinic phone appointment on 03/31/2024.   91 day device clinic remote transmission 03/11/2024.     EP/Cardiology Office Visits:   03/06/2024 with Mertha Abrahams, PA  Recall 05/25/2024 with Dr Crotioru.  Recall 07/28/2023 with Dr Carolynne Citron.   Copy of ICM check sent to Dr. Carolynne Citron.    3 month ICM trend: 02/25/2024.    12-14 Month ICM trend:     Almyra Jain, RN 02/29/2024 8:28 AM

## 2024-03-01 ENCOUNTER — Other Ambulatory Visit: Payer: Self-pay | Admitting: Cardiovascular Disease

## 2024-03-01 DIAGNOSIS — I4891 Unspecified atrial fibrillation: Secondary | ICD-10-CM

## 2024-03-03 ENCOUNTER — Ambulatory Visit: Attending: Cardiovascular Disease

## 2024-03-03 DIAGNOSIS — Z7901 Long term (current) use of anticoagulants: Secondary | ICD-10-CM | POA: Diagnosis not present

## 2024-03-03 DIAGNOSIS — I480 Paroxysmal atrial fibrillation: Secondary | ICD-10-CM | POA: Diagnosis not present

## 2024-03-03 LAB — CYTOLOGY - PAP
Chlamydia: NEGATIVE
Comment: NEGATIVE
Comment: NEGATIVE
Comment: NEGATIVE
Comment: NORMAL
High risk HPV: POSITIVE — AB
Neisseria Gonorrhea: NEGATIVE
Trichomonas: NEGATIVE

## 2024-03-03 LAB — POCT INR: INR: 1.7 — AB (ref 2.0–3.0)

## 2024-03-03 NOTE — Patient Instructions (Signed)
 Take 1.5 tablets today only then continue taking warfarin 1 tablet daily.  Eat 1-2 helpings of greens per week. Recheck INR in 4 weeks. Coumadin  Clinic [phone]

## 2024-03-04 ENCOUNTER — Encounter: Payer: Self-pay | Admitting: Obstetrics and Gynecology

## 2024-03-04 ENCOUNTER — Other Ambulatory Visit: Payer: Self-pay | Admitting: Obstetrics and Gynecology

## 2024-03-04 DIAGNOSIS — R87810 Cervical high risk human papillomavirus (HPV) DNA test positive: Secondary | ICD-10-CM

## 2024-03-04 DIAGNOSIS — R87612 Low grade squamous intraepithelial lesion on cytologic smear of cervix (LGSIL): Secondary | ICD-10-CM

## 2024-03-05 ENCOUNTER — Other Ambulatory Visit: Payer: Self-pay

## 2024-03-05 DIAGNOSIS — R87612 Low grade squamous intraepithelial lesion on cytologic smear of cervix (LGSIL): Secondary | ICD-10-CM

## 2024-03-05 NOTE — Progress Notes (Unsigned)
 Cardiology Office Note Date:  03/05/2024  Patient ID:  Danielle Mckinney, DOB Dec 19, 1950, MRN [id number] PCP:  Ransom Byers, MD  Cardiologist:  Dr. Alvis Ba Electrophysiologist: Dr. Carolynne Citron     Chief Complaint: *** annual visit  History of Present Illness: Danielle Mckinney is a 73 y.o. female with history of NICM, Afib, chronic CHF (systolic >> diastolic), HTN, obesity  She was seen by Dr. Carolynne Citron last 12/21/20, seems to evaluate her pocket, noted erythema at her device site, suspected infection device extraction.  She wanted though to 1st visit with her [place] of [place] just about to give birth.  Started on Abx in effort to get her through the visit. She had no symptoms of illness, instructed to see attention if she developed any.  No procedure was done, looks like she saw Dr. Tita Form 09/26/21, she was doing well, rarely needed her lasix , no HF exacerbations, Riata lead functioning, apparently with known noise and RV sensing was programmed via LV lead.  No discussion on infection or site concerns  I saw her 07/18/22 She is doing well Denies CP, palpitations, cardiac awareness No SOB at rest or on flat ground but inclines and stairs for years get her winded No near syncope or syncope. No bleeding or signs of bleeding  Warfarin managed by our coumadin  clinic She denies any symptoms of illness, no fever chills  Skin changes she reports are since her last procedure, not better, not worse  F/u with Dr. Carolynne Citron 07/21/23 2/2 pocket concerns, skin change/discoloration without signs of infection with plans for watchful waiting  She saw Dr. Alvis Ba 07/01/23, doing great, active, line dancing/exercising.  Device check with good measurements, single 15 beat run of paroxysmal atrial tachycardia but has not had atrial fibrillation or ventricular tachycardia  Discussed adding SGLT2i pt preferred no changes  TODAY  *** BP% *** AF? *** symptoms *** volume *** pocket  Device information [employer]  CRT-D implanted 01/04/2007, gen change/downgrade to CRT-P on 12/16/2019   Past Medical History:  Diagnosis Date   Atrial fibrillation (HCC)    Automatic implantable cardioverter-defibrillator in situ 08/25/2009   Qualifier: Diagnosis of  By: Carolynne Citron, MD, [facility], [name]     Biventricular ICD (implantable cardioverter-defibrillator) in place    [employer]   Breast cancer (HCC)    1989, 1998   Cancer (HCC)    Breast cancer-BRACA I gene   CHF 08/18/2009   Qualifier: Diagnosis of  By: [name]     CHF (congestive heart failure) (HCC)    Chronic diastolic CHF (congestive heart failure) (HCC) 04/23/2012   CIN I (cervical intraepithelial neoplasia I)    CVA (cerebral vascular accident) (HCC) 01/15/2013   Essential hypertension 08/18/2009   Qualifier: Diagnosis of  By: [name]     Fibroid    Fibroid uterus 04/15/2013   Genetic testing 07/06/2015   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  The report date is January 21, 1998.    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   Stroke [facility])     Past Surgical History:  Procedure Laterality Date   BIV ICD GENERTAOR CHANGE OUT  12/20/2011   [employer]   BIV ICD GENERTAOR CHANGE OUT N/A 12/20/2011   Procedure: BIV ICD GENERTAOR CHANGE OUT;  Surgeon: [name], MD;  Location:  [facility] CATH LAB;  Service: Cardiovascular;  Laterality: N/A;   BIV PACEMAKER GENERATOR CHANGEOUT N/A 12/16/2019   Procedure: DOWNGRADE TO BIV PACEMAKER;  Surgeon: [name], MD;  Location: [facility] INVASIVE CV LAB;  Service: Cardiovascular;  Laterality: N/A;   BREAST SURGERY     Bilateral mastctomy and reconstruction TRAM   CARDIAC DEFIBRILLATOR PLACEMENT     CERVICAL CONE BIOPSY     COLPOSCOPY     GYNECOLOGIC CRYOSURGERY     MASTECTOMY     OOPHORECTOMY     BSO    TRANSTHORACIC ECHOCARDIOGRAM  08/14/2006   TUBAL LIGATION     US  ECHOCARDIOGRAPHY  06/25/2012   borderline concentric LVH,LV systolic fx mildly reduced,impaired LV relaxation    Current Outpatient Medications  Medication Sig Dispense Refill   Calcium-Magnesium-Vitamin D (CALCIUM 1200+D3 PO) Take 1 tablet by mouth daily.      carvedilol  (COREG ) 6.25 MG tablet Take 1.5 tablets (9.375 mg total) by mouth 2 (two) times daily with a meal. 270 tablet 3   Cholecalciferol 25 MCG (1000 UT) tablet Take 1,000 Units by mouth daily.     Flaxseed, Linseed, (FLAX SEED OIL) 1000 MG CAPS Take 1 capsule by mouth daily at 6 (six) AM.     furosemide  (LASIX ) 40 MG tablet TAKE 1 TABLET BY MOUTH EVERY DAY AS NEEDED FOR SWELLING 90 tablet 1   losartan  (COZAAR ) 100 MG tablet TAKE 1/2 TABLET(50 MG) BY MOUTH TWICE DAILY 90 tablet 2   meclizine (ANTIVERT) 25 MG tablet Take 25 mg by mouth 3 (three) times daily as needed for dizziness.     Polyethyl Glycol-Propyl Glycol (SYSTANE OP) Place 1 drop into both eyes 2 (two) times daily.     potassium chloride  SA (KLOR-CON ) 20 MEQ tablet TAKE 1 TABLET BY MOUTH DAILY IF NEEDED(TAKE ALONG WITH FUROSEMIDE  WHEN NEEDED) 30 tablet 3   rosuvastatin (CRESTOR) 10 MG tablet Take 10 mg by mouth daily.     spironolactone  (ALDACTONE ) 25 MG tablet Take 1 tablet (25 mg total) by mouth daily. TAKE 1 TABLET(25 MG) BY MOUTH DAILY 90 tablet 3   valACYclovir (VALTREX) 500 MG tablet Take 500 mg by mouth as needed.     warfarin (COUMADIN ) 4 MG tablet TAKE 1 TO 1 AND 1/2 TABLETS BY MOUTH DAILY AS DIRECTED BY COUMADIN  CLINIC 100 tablet 0   No current facility-administered medications for this visit.    Allergies:   Codeine, Hydrocodone-acetaminophen , Lisinopril, Oxycodone, Penicillins, and Sulfa antibiotics   Social History:  The patient  reports that she has never smoked. She has never used smokeless tobacco. She reports that she does not drink alcohol and does not use drugs.   Family History:   The patient's family history includes Breast cancer in her sister; Cirrhosis in her father; Clotting disorder in her mother; Coronary artery disease in an other family member; Heart disease in her maternal grandfather; Hypertension in her brother and sister.  ROS:  Please see the history of present illness.    All other systems are reviewed and otherwise negative.   PHYSICAL EXAM:  VS:  There were no vitals taken for this visit. BMI: There is no height or weight on file to calculate BMI. Well nourished, well developed, in no acute distress HEENT: normocephalic, atraumatic Neck: no JVD, carotid bruits or masses Cardiac: *** RRR; no significant murmurs, no rubs, or gallops Lungs: *** CTA b/l, no wheezing, rhonchi or rales Abd: soft, nontender MS: no deformity or atrophy Ext: *** no edema Skin: warm  and dry, no rash Neuro:  No gross deficits appreciated Psych: euthymic mood, full affect  *** PPM site is *** has skin discoloration at the pocket that extends toward midline chest and inferiorly towards L breast. There is some tethering by my exam, only slightly tender at the very superior edge of the pocket. Not hot, perhaps slightly warm   EKG:  not done today 05/31/23 reviewed Good QRS morphology, QRS  Device interrogation done today and reviewed by myself:  *** Battery and lead measurements are good *** No arrhythmias *** %BP   12/09/2019: TTE  1. Left ventricular ejection fraction, by estimation, is 55 to 60%. The  left ventricle has normal function. The left ventrical has no regional  wall motion abnormalities. Left ventricular diastolic parameters are  consistent with Grade I diastolic  dysfunction (impaired relaxation). Elevated left ventricular pressure.   2. Right ventricular systolic function is normal. The right ventricular  size is normal. There is mildly elevated pulmonary artery systolic  pressure.   3. Left atrial size was mildly dilated.   4. Trivial mitral  valve regurgitation.   5. The aortic valve is normal in structure and function. Aortic valve  regurgitation is not visualized. No aortic stenosis is present.   6. The inferior vena cava is normal in size with greater than 50%  respiratory variability, suggesting right atrial pressure of 3 mmHg.   Recent Labs: No results found for requested labs within last 365 days.  No results found for requested labs within last 365 days.   CrCl cannot be calculated (Patient's most recent lab result is older than the maximum 21 days allowed.).   Wt Readings from Last 3 Encounters:  02/27/24 179 lb (81.2 kg)  08/17/23 190 lb 8 oz (86.4 kg)  08/03/23 190 lb 6.4 oz (86.4 kg)     Other studies reviewed: Additional studies/records reviewed today include: summarized above  ASSESSMENT AND PLAN:  CRT-P *** Intact function *** No programming changes  Paroxysmal Afib CHA2DS2Vasc is 4, on warfarin, monitored and managed by *** our coumadin  clinic ***% burden  NICM Chronic CHF Recovered LVEF > diastolic *** No symptoms or exam findings of volume OL  *** CoreVue C/w Dr. Alvis Ba  HTN *** Looks good  Disposition: ***  Current medicines are reviewed at length with the patient today.  The patient did not have any concerns regarding medicines.  [place], PA-C 03/05/2024 5:21 PM     [facility] [address] [place] [address] [phone] (office)  [phone] (fax)

## 2024-03-06 ENCOUNTER — Encounter: Payer: Self-pay | Admitting: Physician Assistant

## 2024-03-06 ENCOUNTER — Ambulatory Visit: Attending: Physician Assistant | Admitting: Physician Assistant

## 2024-03-06 VITALS — BP 134/70 | HR 82 | Ht 62.0 in | Wt 184.4 lb

## 2024-03-06 DIAGNOSIS — Z950 Presence of cardiac pacemaker: Secondary | ICD-10-CM | POA: Diagnosis not present

## 2024-03-06 DIAGNOSIS — Z79899 Other long term (current) drug therapy: Secondary | ICD-10-CM | POA: Diagnosis not present

## 2024-03-06 DIAGNOSIS — I428 Other cardiomyopathies: Secondary | ICD-10-CM

## 2024-03-06 DIAGNOSIS — I10 Essential (primary) hypertension: Secondary | ICD-10-CM

## 2024-03-06 DIAGNOSIS — I5043 Acute on chronic combined systolic (congestive) and diastolic (congestive) heart failure: Secondary | ICD-10-CM

## 2024-03-06 LAB — CUP PACEART INCLINIC DEVICE CHECK
Battery Remaining Longevity: 42 mo
Battery Voltage: 2.98 V
Brady Statistic RA Percent Paced: 9.1 %
Brady Statistic RV Percent Paced: 99.52 %
Date Time Interrogation Session: 20250508180230
Implantable Lead Connection Status: 753985
Implantable Lead Connection Status: 753985
Implantable Lead Connection Status: 753985
Implantable Lead Implant Date: 20080307
Implantable Lead Implant Date: 20080307
Implantable Lead Implant Date: 20080307
Implantable Lead Location: 753858
Implantable Lead Location: 753859
Implantable Lead Location: 753860
Implantable Lead Model: 7001
Implantable Pulse Generator Implant Date: 20210216
Lead Channel Impedance Value: 337.5 Ohm
Lead Channel Impedance Value: 562.5 Ohm
Lead Channel Impedance Value: 725 Ohm
Lead Channel Pacing Threshold Amplitude: 0.5 V
Lead Channel Pacing Threshold Amplitude: 0.5 V
Lead Channel Pacing Threshold Amplitude: 0.5 V
Lead Channel Pacing Threshold Amplitude: 0.875 V
Lead Channel Pacing Threshold Pulse Width: 0.5 ms
Lead Channel Pacing Threshold Pulse Width: 0.5 ms
Lead Channel Pacing Threshold Pulse Width: 0.8 ms
Lead Channel Pacing Threshold Pulse Width: 0.8 ms
Lead Channel Sensing Intrinsic Amplitude: 11.5 mV
Lead Channel Sensing Intrinsic Amplitude: 5 mV
Lead Channel Setting Pacing Amplitude: 1.5 V
Lead Channel Setting Pacing Amplitude: 1.75 V
Lead Channel Setting Pacing Amplitude: 2 V
Lead Channel Setting Pacing Pulse Width: 0.5 ms
Lead Channel Setting Pacing Pulse Width: 0.8 ms
Lead Channel Setting Sensing Sensitivity: 2 mV
Pulse Gen Model: 3222
Pulse Gen Serial Number: 9105249

## 2024-03-06 NOTE — Patient Instructions (Addendum)
 Medication Instructions:   Your physician recommends that you continue on your current medications as directed. Please refer to the Current Medication list given to you today.   *If you need a refill on your cardiac medications before your next appointment, please call your pharmacy*   Lab Work:   NONE ORDERED  TODAY    If you have labs (blood work) drawn today and your tests are completely normal, you will receive your results only by: MyChart Message (if you have MyChart) OR A paper copy in the mail If you have any lab test that is abnormal or we need to change your treatment, we will call you to review the results  Testing/Procedures:  NONE ORDERED  TODAY     Follow-Up: At [facility], you and your health needs are our priority.  As part of our continuing mission to provide you with exceptional heart care, our providers are all part of one team.  This team includes your primary Cardiologist (physician) and Advanced Practice Providers or APPs (Physician Assistants and Nurse Practitioners) who all work together to provide you with the care you need, when you need it.  Your next appointment:  DR Bertha Broad  IN JULY  ( RECALL)   6 month(s)   Provider:    Manya Sells, MD  ONLY PER PATIENT REQUEST     We recommend signing up for the patient portal called "MyChart".  Sign up information is provided on this After Visit Summary.  MyChart is used to connect with patients for Virtual Visits (Telemedicine).  Patients are able to view lab/test results, encounter notes, upcoming appointments, etc.  Non-urgent messages can be sent to your provider as well.   To learn more about what you can do with MyChart, go to [email or web address].   Other Instructions

## 2024-03-11 ENCOUNTER — Ambulatory Visit (INDEPENDENT_AMBULATORY_CARE_PROVIDER_SITE_OTHER): Payer: Medicare Other

## 2024-03-11 DIAGNOSIS — I428 Other cardiomyopathies: Secondary | ICD-10-CM | POA: Diagnosis not present

## 2024-03-11 LAB — CUP PACEART REMOTE DEVICE CHECK
Battery Remaining Longevity: 42 mo
Battery Remaining Percentage: 44 %
Battery Voltage: 2.98 V
Brady Statistic AP VP Percent: 6.1 %
Brady Statistic AP VS Percent: 1 %
Brady Statistic AS VP Percent: 92 %
Brady Statistic AS VS Percent: 1 %
Brady Statistic RA Percent Paced: 3.7 %
Date Time Interrogation Session: 20250513021656
Implantable Lead Connection Status: 753985
Implantable Lead Connection Status: 753985
Implantable Lead Connection Status: 753985
Implantable Lead Implant Date: 20080307
Implantable Lead Implant Date: 20080307
Implantable Lead Implant Date: 20080307
Implantable Lead Location: 753858
Implantable Lead Location: 753859
Implantable Lead Location: 753860
Implantable Lead Model: 7001
Implantable Pulse Generator Implant Date: 20210216
Lead Channel Impedance Value: 330 Ohm
Lead Channel Impedance Value: 550 Ohm
Lead Channel Impedance Value: 780 Ohm
Lead Channel Pacing Threshold Amplitude: 0.5 V
Lead Channel Pacing Threshold Amplitude: 0.5 V
Lead Channel Pacing Threshold Amplitude: 0.75 V
Lead Channel Pacing Threshold Pulse Width: 0.5 ms
Lead Channel Pacing Threshold Pulse Width: 0.5 ms
Lead Channel Pacing Threshold Pulse Width: 0.8 ms
Lead Channel Sensing Intrinsic Amplitude: 11.5 mV
Lead Channel Sensing Intrinsic Amplitude: 5 mV
Lead Channel Setting Pacing Amplitude: 1.5 V
Lead Channel Setting Pacing Amplitude: 1.75 V
Lead Channel Setting Pacing Amplitude: 2 V
Lead Channel Setting Pacing Pulse Width: 0.5 ms
Lead Channel Setting Pacing Pulse Width: 0.8 ms
Lead Channel Setting Sensing Sensitivity: 2 mV
Pulse Gen Model: 3222
Pulse Gen Serial Number: 9105249

## 2024-03-17 ENCOUNTER — Ambulatory Visit: Payer: Self-pay | Admitting: Internal Medicine

## 2024-03-31 ENCOUNTER — Ambulatory Visit: Attending: Internal Medicine

## 2024-03-31 DIAGNOSIS — Z45018 Encounter for adjustment and management of other part of cardiac pacemaker: Secondary | ICD-10-CM

## 2024-03-31 DIAGNOSIS — I5032 Chronic diastolic (congestive) heart failure: Secondary | ICD-10-CM | POA: Diagnosis not present

## 2024-04-02 ENCOUNTER — Telehealth: Payer: Self-pay

## 2024-04-02 NOTE — Progress Notes (Signed)
 EPIC Encounter for ICM Monitoring  Patient Name: Danielle Mckinney is a 73 y.o. female Date: 04/02/2024 Primary Care Physican: Ransom Byers, MD Primary Cardiologist: Croitoru Electrophysiologist: Arvid Latino Pacing:  98%   05/31/2023 Office Weight: 190 lbs 12/14/2023 Weight: 188-190 lbs 02/27/2024 Office Weight: 179 lbs   AT/AF Burden: 0%    Attempted call to patient and unable to reach.  Left detailed message per DPR regarding transmission.  Transmission results reviewed.    Corvue Thoracic impedance suggesting normal fluid levels with the exception of possible fluid accumulation from 5/21-5/24.   Prescribed:  Furosemide  40 mg Take 40 mg by mouth daily as needed for edema.  Takes a few days a month.  Potassium 20 mEq Take 20 mEq by mouth daily as needed (when taking furosemide ).  Spironolactone  25 mg take 1 tablet daily   Labs: 07/18/2022 Creatinine 0.81, BUN 16, Potassium 4.1, Sodium 139, GFR 79 A complete set of results can be found in Results Review.   Recommendations:   Left voice mail with ICM number and encouraged to call if experiencing any fluid symptoms.   Follow-up plan: ICM clinic phone appointment on 05/26/2024.   91 day device clinic remote transmission 06/10/2024.     EP/Cardiology Office Visits:   03/06/2024 with Mertha Abrahams, PA  05/22/2024 with Dr Crotioru.  Recall 09/02/2024 with Dr Carolynne Citron.   Copy of ICM check sent to Dr. Carolynne Citron.     3 month ICM trend: 6/2+/2025.    12-14 Month ICM trend:     Almyra Jain, RN 04/02/2024 9:59 AM

## 2024-04-02 NOTE — Telephone Encounter (Signed)
Remote ICM transmission received.  Attempted call to patient regarding ICM remote transmission and left detailed message per DPR.  Left ICM phone number and advised to return call for any fluid symptoms or questions.

## 2024-04-03 ENCOUNTER — Ambulatory Visit: Attending: Cardiovascular Disease

## 2024-04-03 DIAGNOSIS — I480 Paroxysmal atrial fibrillation: Secondary | ICD-10-CM | POA: Diagnosis not present

## 2024-04-03 LAB — POCT INR: INR: 1.8 — AB (ref 2.0–3.0)

## 2024-04-03 NOTE — Patient Instructions (Signed)
 Description   Take 1.5 tablets today only then continue taking warfarin 1 tablet daily.  Eat 1-2 helpings of greens per week. Recheck INR in 4 weeks. Coumadin  Clinic [phone]

## 2024-04-09 NOTE — Progress Notes (Signed)
 GYNECOLOGY  VISIT   HPI: 73 y.o.   Divorced  [place] female   [phone] with No LMP recorded. Patient is postmenopausal.   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   Hx prior cone biopsy.   On Coumadin .   She took Tylenol  500 mg prior to the procedure.   GYNECOLOGIC HISTORY: No LMP recorded. Patient is postmenopausal. 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) 07/01/2015   Breast cancer, right breast (HCC) 07/01/2015   Fibroid uterus 04/15/2013   Long term current use of anticoagulant therapy 01/15/2013   CVA (cerebral vascular accident) (HCC) 01/15/2013   Chronic diastolic CHF (congestive heart failure) (HCC) 04/23/2012   Fibroid    CIN I (cervical intraepithelial neoplasia I)    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   Essential hypertension 08/18/2009   Atrial fibrillation (HCC) 08/18/2009   CHF 08/18/2009    Past Medical History:  Diagnosis Date   Atrial fibrillation (HCC)    Automatic implantable cardioverter-defibrillator in situ 08/25/2009   Qualifier: Diagnosis of  By: Carolynne Citron, MD, [facility], [name]     Biventricular ICD (implantable cardioverter-defibrillator) in place    [employer]   Breast cancer (HCC)    1989, 1998   Cancer (HCC)    Breast cancer-BRACA I gene   CHF 08/18/2009   Qualifier: Diagnosis of  By: [name]     CHF (congestive heart failure) (HCC)    Chronic diastolic CHF (congestive heart failure) (HCC) 04/23/2012   CIN I (cervical intraepithelial neoplasia I)    CVA (cerebral vascular accident) (HCC) 01/15/2013   Essential hypertension 08/18/2009   Qualifier: Diagnosis of  By: [name]     Fibroid    Fibroid uterus 04/15/2013   Genetic testing 07/06/2015   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  The report date is January 21, 1998.    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   Stroke [facility])     Past Surgical History:  Procedure Laterality Date   BIV ICD GENERTAOR CHANGE OUT  12/20/2011   [employer]   BIV ICD GENERTAOR CHANGE OUT N/A 12/20/2011   Procedure: BIV ICD GENERTAOR CHANGE OUT;  Surgeon: [name], MD;  Location: [facility] CATH LAB;  Service: Cardiovascular;  Laterality: N/A;   BIV PACEMAKER GENERATOR CHANGEOUT N/A 12/16/2019   Procedure: DOWNGRADE TO BIV PACEMAKER;  Surgeon: Carolynne Citron,  Stevens Eland, MD;  Location: [facility] INVASIVE CV LAB;  Service: Cardiovascular;  Laterality: N/A;   BREAST SURGERY     Bilateral mastctomy and reconstruction TRAM   CARDIAC DEFIBRILLATOR PLACEMENT     CERVICAL CONE BIOPSY     COLPOSCOPY     GYNECOLOGIC CRYOSURGERY     MASTECTOMY     OOPHORECTOMY     BSO   TRANSTHORACIC ECHOCARDIOGRAM  08/14/2006   TUBAL LIGATION     US  ECHOCARDIOGRAPHY  06/25/2012   borderline concentric LVH,LV systolic fx mildly reduced,impaired LV relaxation    Current Outpatient Medications  Medication Sig Dispense Refill   Calcium-Magnesium-Vitamin D (CALCIUM 1200+D3 PO) Take 1 tablet by mouth daily.      carvedilol  (COREG ) 6.25 MG tablet Take 1.5 tablets (9.375 mg total) by mouth 2 (two) times daily with a meal. 270 tablet 3   Cholecalciferol 25 MCG (1000 UT) tablet Take 1,000 Units by  mouth daily.     Flaxseed, Linseed, (FLAX SEED OIL) 1000 MG CAPS Take 1 capsule by mouth daily at 6 (six) AM.     furosemide  (LASIX ) 40 MG tablet TAKE 1 TABLET BY MOUTH EVERY DAY AS NEEDED FOR SWELLING 90 tablet 1   losartan  (COZAAR ) 100 MG tablet TAKE 1/2 TABLET(50 MG) BY MOUTH TWICE DAILY 90 tablet 2   meclizine (ANTIVERT) 25 MG tablet Take 25 mg by mouth 3 (three) times daily as needed for dizziness.     Polyethyl Glycol-Propyl Glycol (SYSTANE OP) Place 1 drop into both eyes 2 (two) times daily.     potassium chloride  SA (KLOR-CON ) 20 MEQ tablet TAKE 1 TABLET BY MOUTH DAILY IF NEEDED(TAKE ALONG WITH FUROSEMIDE  WHEN NEEDED) 30 tablet 3   rosuvastatin (CRESTOR) 10 MG tablet Take 10 mg by mouth daily.     spironolactone  (ALDACTONE ) 25 MG tablet Take 1 tablet (25 mg total) by mouth daily. TAKE 1 TABLET(25 MG) BY MOUTH DAILY 90 tablet 3   valACYclovir (VALTREX) 500 MG tablet Take 500 mg by mouth as needed.     warfarin (COUMADIN ) 4 MG tablet TAKE 1 TO 1 AND 1/2 TABLETS BY MOUTH DAILY AS DIRECTED BY COUMADIN  CLINIC 100 tablet 0   No current facility-administered medications for this visit.     ALLERGIES: Codeine, Hydrocodone-acetaminophen , Lisinopril, Oxycodone, Penicillins, and Sulfa antibiotics  Family History  Problem Relation Age of Onset   Breast cancer Sister        Age 39   Hypertension Sister    Heart disease Maternal Grandfather    Clotting disorder Mother    Cirrhosis Father    Coronary artery disease Other    Hypertension Brother     Social History   Socioeconomic History   Marital status: Divorced    Spouse name: Not on file   Number of children: Not on file   Years of education: Not on file   Highest education level: Not on file  Occupational History   Not on file  Tobacco Use   Smoking status: Never   Smokeless tobacco: Never  Vaping Use   Vaping status: Never Used  Substance and Sexual Activity   Alcohol use: No    Alcohol/week: 0.0 standard drinks of alcohol    Drug use: No   Sexual activity: Yes    Birth control/protection: Post-menopausal, Surgical    Comment: BTL, First IC <16, Partners 5, DES-neg  Other Topics Concern   Not on file  Social History Narrative   Not on file   Social Drivers of  Health   Financial Resource Strain: Not on file  Food Insecurity: Not on file  Transportation Needs: Not on file  Physical Activity: Not on file  Stress: Not on file  Social Connections: Not on file  Intimate Partner Violence: Not on file    Review of Systems  All other systems reviewed and are negative.   PHYSICAL EXAMINATION:   BP 126/78 (BP Location: Left Arm, Patient Position: Sitting)   Pulse 76   SpO2 97%     General appearance: alert, cooperative and appears stated age   Colposcopy - cervix, vagina Consent for procedure.  Time out done.  3% acetic acid used in vagina and on cervix.  White light and green light filter used.  Colposcopy satisfactory:  Yes   _____          No    __x___ Findings:    Cervix:  atrophy and cervical stenosis noted.  Pale coloration of the cervix inferiorly.  Vagina:  no lesions.  Biopsies:   Hibiclens  prep to cervix,  Tenaculum placed to anterior cervical lip.  Os finder used to dilate the cervix.  ECC collected and sent to pathology.  Then biopsy of cervix at 6:00 with Tischler, and tissue to pathology separately.  Monsel's placed.  Minimal EBL. No complications.   Chaperone was present for exam:  Cottie Diss, CMA  ASSESSMENT:  Pap LGSIL, positive HR HPV.  Hx cervical conization.  Atrophy noted.  On coumadin .   PLAN:  Pap, HPV, colposcopy, and LEEP discussed. Fu ECC and cervical biopsy.  Final plan to follow.  At a minimum, patient will need pap and HR HPV testing in one year. Post colposcopy precautions given.  Tylenol  325 mg given to the patient following procedure.

## 2024-04-10 ENCOUNTER — Ambulatory Visit (INDEPENDENT_AMBULATORY_CARE_PROVIDER_SITE_OTHER): Admitting: Obstetrics and Gynecology

## 2024-04-10 ENCOUNTER — Other Ambulatory Visit (HOSPITAL_COMMUNITY)
Admission: RE | Admit: 2024-04-10 | Discharge: 2024-04-10 | Disposition: A | Discharge status: Home / Self Care | Source: Ambulatory Visit | Attending: Obstetrics and Gynecology | Admitting: Obstetrics and Gynecology

## 2024-04-10 ENCOUNTER — Encounter: Payer: Self-pay | Admitting: Obstetrics and Gynecology

## 2024-04-10 VITALS — BP 126/78 | HR 76

## 2024-04-10 DIAGNOSIS — R87612 Low grade squamous intraepithelial lesion on cytologic smear of cervix (LGSIL): Secondary | ICD-10-CM | POA: Insufficient documentation

## 2024-04-10 DIAGNOSIS — R87810 Cervical high risk human papillomavirus (HPV) DNA test positive: Secondary | ICD-10-CM | POA: Diagnosis present

## 2024-04-10 NOTE — Patient Instructions (Signed)
 Colposcopy, Care After  The following information offers guidance on how to care for yourself after your procedure. Your health care provider may also give you more specific instructions. If you have problems or questions, contact your health care provider. What can I expect after the procedure? If you had a colposcopy without a biopsy, you can expect to feel fine right away after your procedure. However, you may have some spotting of blood for a few days. You can return to your normal activities. If you had a colposcopy with a biopsy, it is common after the procedure to have: Soreness and mild pain. These may last for a few days. Mild vaginal bleeding or discharge that is dark-colored and grainy. This may last for a few days. The discharge may be caused by a liquid (solution) that was used during the procedure. You may need to wear a sanitary pad during this time. Spotting of blood for at least 48 hours after the procedure. Follow these instructions at home: Medicines Take over-the-counter and prescription medicines only as told by your health care provider. Talk with your health care provider about what type of over-the-counter pain medicines and prescription medicines you can start to take again. It is especially important to talk with your health care provider if you take blood thinners. Activity Avoid using douche products, using tampons, and having sex for at least 3 days after the procedure or for as long as told by your health care provider. Return to your normal activities as told by your health care provider. Ask your health care provider what activities are safe for you. General instructions Ask your health care provider if you may take baths, swim, or use a hot tub. You may take showers. If you use birth control (contraception), continue to use it. Keep all follow-up visits. This is important. Contact a health care provider if: You have a fever or chills. You faint or feel  light-headed. Get help right away if: You have heavy bleeding from your vagina or pass blood clots. Heavy bleeding is bleeding that soaks through a sanitary pad in less than 1 hour. You have vaginal discharge that is abnormal, is yellow in color, or smells bad. This could be a sign of infection. You have severe pain or cramps in your lower abdomen that do not go away with medicine. Summary If you had a colposcopy without a biopsy, you can expect to feel fine right away, but you may have some spotting of blood for a few days. You can return to your normal activities. If you had a colposcopy with a biopsy, it is common to have mild pain for a few days and spotting for 48 hours after the procedure. Avoid using douche products, using tampons, and having sex for at least 3 days after the procedure or for as long as told by your health care provider. Get help right away if you have heavy bleeding, severe pain, or signs of infection. This information is not intended to replace advice given to you by your health care provider. Make sure you discuss any questions you have with your health care provider. Document Revised: 03/13/2021 Document Reviewed: 03/13/2021 Elsevier Patient Education  2024 [employer].

## 2024-04-14 ENCOUNTER — Ambulatory Visit: Payer: Self-pay | Admitting: Obstetrics and Gynecology

## 2024-04-14 LAB — SURGICAL PATHOLOGY

## 2024-04-22 ENCOUNTER — Encounter: Admitting: Obstetrics and Gynecology

## 2024-04-28 NOTE — Progress Notes (Signed)
 Remote ICD transmission.

## 2024-04-29 ENCOUNTER — Ambulatory Visit

## 2024-05-01 ENCOUNTER — Encounter: Admitting: Obstetrics and Gynecology

## 2024-05-05 ENCOUNTER — Encounter

## 2024-05-13 ENCOUNTER — Other Ambulatory Visit: Payer: Self-pay | Admitting: Cardiovascular Disease

## 2024-05-13 ENCOUNTER — Ambulatory Visit: Attending: Cardiovascular Disease

## 2024-05-13 DIAGNOSIS — I480 Paroxysmal atrial fibrillation: Secondary | ICD-10-CM | POA: Diagnosis not present

## 2024-05-13 DIAGNOSIS — Z7901 Long term (current) use of anticoagulants: Secondary | ICD-10-CM

## 2024-05-13 DIAGNOSIS — I4891 Unspecified atrial fibrillation: Secondary | ICD-10-CM

## 2024-05-13 LAB — POCT INR: INR: 2.7 (ref 2.0–3.0)

## 2024-05-13 NOTE — Progress Notes (Signed)
Please see anticoagulation encounter.

## 2024-05-13 NOTE — Patient Instructions (Signed)
 Take 0.5 tablet tonight only then continue taking warfarin 1 tablet daily.  Eat 1-2 helpings of greens per week. Recheck INR in 4 weeks. Coumadin  Clinic [phone]

## 2024-05-13 NOTE — Telephone Encounter (Signed)
 Refill request for warfarin:  Last INR was 2.7 on 05/13/24 Next INR due 06/10/24 LOV was 03/06/24  Refill approved.

## 2024-05-22 ENCOUNTER — Ambulatory Visit: Admitting: Cardiovascular Disease

## 2024-05-26 ENCOUNTER — Ambulatory Visit: Attending: Internal Medicine

## 2024-05-26 DIAGNOSIS — I5032 Chronic diastolic (congestive) heart failure: Secondary | ICD-10-CM

## 2024-05-26 DIAGNOSIS — Z45018 Encounter for adjustment and management of other part of cardiac pacemaker: Secondary | ICD-10-CM | POA: Diagnosis not present

## 2024-05-29 ENCOUNTER — Telehealth: Payer: Self-pay

## 2024-05-29 NOTE — Progress Notes (Signed)
 EPIC Encounter for ICM Monitoring  Patient Name: Danielle Mckinney is a 73 y.o. female Date: 05/29/2024 Primary Care Physican: Chrystal Lamarr GORMAN, MD Primary Cardiologist: Croitoru Electrophysiologist: Waddell Pore Pacing:  >99%  05/31/2023 Office Weight: 190 lbs 12/14/2023 Weight: 188-190 lbs 02/27/2024 Office Weight: 179 lbs   AT/AF Burden: 0%    Attempted call to patient and unable to reach.  Left detailed message per DPR regarding transmission.  Transmission results reviewed.    Corvue Thoracic impedance suggesting normal fluid levels with the exception of possible fluid accumulation from 7/12-7/16.   Prescribed:  Furosemide  40 mg Take 40 mg by mouth daily as needed for edema.  Takes a few days a month.  Potassium 20 mEq Take 20 mEq by mouth daily as needed (when taking furosemide ).  Spironolactone  25 mg take 1 tablet daily   Labs: 07/18/2022 Creatinine 0.81, BUN 16, Potassium 4.1, Sodium 139, GFR 79 A complete set of results can be found in Results Review.   Recommendations:   Left voice mail with ICM number and encouraged to call if experiencing any fluid symptoms.   Follow-up plan: ICM clinic phone appointment on 07/01/2024.   91 day device clinic remote transmission 06/10/2024.     EP/Cardiology Office Visits:   Recall 09/02/2024 with Dr Waddell.   07/31/2024 with Dr Crotioru.     Copy of ICM check sent to Dr. Waddell.    3 month ICM trend: 05/26/2024.    12-14 Month ICM trend:     [name], RN 05/29/2024 4:27 PM

## 2024-05-29 NOTE — Telephone Encounter (Signed)
 Remote ICM transmission received.  Attempted call to patient regarding ICM remote transmission and left detailed message per DPR.  Left ICM phone number and advised to return call for any fluid symptoms or questions. Next ICM remote transmission scheduled 07/01/2024.

## 2024-06-10 ENCOUNTER — Ambulatory Visit: Payer: Medicare Other

## 2024-06-10 ENCOUNTER — Other Ambulatory Visit: Payer: Self-pay

## 2024-06-10 ENCOUNTER — Ambulatory Visit

## 2024-06-10 DIAGNOSIS — I428 Other cardiomyopathies: Secondary | ICD-10-CM

## 2024-06-10 MED ORDER — LOSARTAN POTASSIUM 100 MG PO TABS: 50.0000 mg | ORAL_TABLET | Freq: Two times a day (BID) | ORAL | 0 refills | Status: DC

## 2024-06-11 ENCOUNTER — Other Ambulatory Visit: Payer: Self-pay

## 2024-06-11 LAB — CUP PACEART REMOTE DEVICE CHECK
Battery Remaining Longevity: 40 mo
Battery Remaining Percentage: 41 %
Battery Voltage: 2.96 V
Brady Statistic AP VP Percent: 6.3 %
Brady Statistic AP VS Percent: 1 %
Brady Statistic AS VP Percent: 93 %
Brady Statistic AS VS Percent: 1 %
Brady Statistic RA Percent Paced: 5.3 %
Date Time Interrogation Session: 20250812020014
Implantable Lead Connection Status: 753985
Implantable Lead Connection Status: 753985
Implantable Lead Connection Status: 753985
Implantable Lead Implant Date: 20080307
Implantable Lead Implant Date: 20080307
Implantable Lead Implant Date: 20080307
Implantable Lead Location: 753858
Implantable Lead Location: 753859
Implantable Lead Location: 753860
Implantable Lead Model: 7001
Implantable Pulse Generator Implant Date: 20210216
Lead Channel Impedance Value: 300 Ohm
Lead Channel Impedance Value: 560 Ohm
Lead Channel Impedance Value: 810 Ohm
Lead Channel Pacing Threshold Amplitude: 0.5 V
Lead Channel Pacing Threshold Amplitude: 0.5 V
Lead Channel Pacing Threshold Amplitude: 0.875 V
Lead Channel Pacing Threshold Pulse Width: 0.5 ms
Lead Channel Pacing Threshold Pulse Width: 0.5 ms
Lead Channel Pacing Threshold Pulse Width: 0.8 ms
Lead Channel Sensing Intrinsic Amplitude: 12 mV
Lead Channel Sensing Intrinsic Amplitude: 5 mV
Lead Channel Setting Pacing Amplitude: 1.5 V
Lead Channel Setting Pacing Amplitude: 1.75 V
Lead Channel Setting Pacing Amplitude: 2 V
Lead Channel Setting Pacing Pulse Width: 0.5 ms
Lead Channel Setting Pacing Pulse Width: 0.8 ms
Lead Channel Setting Sensing Sensitivity: 2 mV
Pulse Gen Model: 3222
Pulse Gen Serial Number: 9105249

## 2024-06-11 MED ORDER — CARVEDILOL 6.25 MG PO TABS: 9.3750 mg | ORAL_TABLET | Freq: Two times a day (BID) | ORAL | 0 refills | Status: DC

## 2024-06-11 MED ORDER — SPIRONOLACTONE 25 MG PO TABS: 25.0000 mg | ORAL_TABLET | Freq: Every day | ORAL | 3 refills | Status: DC

## 2024-06-12 ENCOUNTER — Ambulatory Visit: Attending: Cardiovascular Disease

## 2024-06-12 ENCOUNTER — Ambulatory Visit: Payer: Self-pay | Admitting: Internal Medicine

## 2024-06-12 DIAGNOSIS — I480 Paroxysmal atrial fibrillation: Secondary | ICD-10-CM

## 2024-06-12 LAB — POCT INR: INR: 1.4 — AB (ref 2.0–3.0)

## 2024-06-12 NOTE — Patient Instructions (Signed)
 Description   Take 2 tablets today and then continue taking warfarin 1 tablet daily.  Eat 1-2 helpings of greens per week. Recheck INR in 2 weeks. Coumadin  Clinic [phone]

## 2024-06-12 NOTE — Progress Notes (Signed)
 INR 1.4. Please see anticoagulation encounter

## 2024-06-19 ENCOUNTER — Ambulatory Visit (INDEPENDENT_AMBULATORY_CARE_PROVIDER_SITE_OTHER): Payer: Medicare Other | Admitting: Dermatology

## 2024-06-19 ENCOUNTER — Encounter: Payer: Self-pay | Admitting: Dermatology

## 2024-06-19 VITALS — BP 98/62 | HR 71

## 2024-06-19 DIAGNOSIS — Z9221 Personal history of antineoplastic chemotherapy: Secondary | ICD-10-CM | POA: Diagnosis not present

## 2024-06-19 DIAGNOSIS — D229 Melanocytic nevi, unspecified: Secondary | ICD-10-CM | POA: Diagnosis not present

## 2024-06-19 DIAGNOSIS — L821 Other seborrheic keratosis: Secondary | ICD-10-CM | POA: Diagnosis not present

## 2024-06-19 NOTE — Patient Instructions (Signed)

## 2024-06-19 NOTE — Progress Notes (Signed)
   Follow-Up Visit   Subjective  Danielle Mckinney is a 73 y.o. female who presents for the following: Nevi  Patient present today for follow up visit for nevi. Patient was last evaluated on 06/21/23. At this visit patient had the nevi measured(4 mm).  Patient reports sxs are unchanged. Patient denies medication changes.  The following portions of the chart were reviewed this encounter and updated as appropriate: medications, allergies, medical history  Review of Systems:  No other skin or systemic complaints except as noted in HPI or Assessment and Plan.  Objective  Well appearing patient in no apparent distress; mood and affect are within normal limits.  A focused examination was performed of the following areas: Right Breast  Relevant exam findings are noted in the Assessment and Plan.    Assessment & Plan   1. Seborrheic Keratosis on Delores Courser - Assessment: Patient has a brown macular patch with a brown waxy papule in the center of the chest. Lesion appears to have evolved into a classic seborrheic keratosis. Seborrheic keratoses are benign growths with no malignant potential. Patient's history of radiation therapy in the area was considered, but the lesion does not appear related to radiation-induced changes. - Plan:    Reassurance provided regarding benign nature of lesion      No intervention required at this time      Annual follow-up recommended to monitor the area that received radiation therapy    2. Intradermal Nevus or Nevus Lipomatosis - Assessment: Patient has a lesion that appears to be either an intradermal nevus or a nevus lipomatosis. Described as a mole-like growth with fatty tissue underneath. Lesion does not exhibit any concerning features suggestive of malignancy. - Plan:    Observation recommended      Surgical excision offered as an option if patient desires removal, with the understanding that it would result in a scar      Patient educated about benign  nature of lesion    3. Skin Cancer Prevention - Assessment: Patient has a history of radiation therapy, which increases the risk of skin cancer in the treated area. Current sunscreen use is inconsistent. - Plan:    Educate patient on the importance of daily sunscreen application, especially on the chest area      Provide samples of sunscreen for patient to try      Recommend consistent use of sunscreen, even when wearing clothing or driving      Annual skin checks to monitor the area that received radiation therapy    No follow-ups on file.  I, Jetta Ager, am acting as Neurosurgeon for [employer], DO.  Documentation: I have reviewed the above documentation for accuracy and completeness, and I agree with the above.  Delon Lenis, DO

## 2024-06-26 ENCOUNTER — Ambulatory Visit: Attending: Cardiovascular Disease | Admitting: *Deleted

## 2024-06-26 DIAGNOSIS — I480 Paroxysmal atrial fibrillation: Secondary | ICD-10-CM

## 2024-06-26 DIAGNOSIS — Z5181 Encounter for therapeutic drug level monitoring: Secondary | ICD-10-CM | POA: Diagnosis not present

## 2024-06-26 LAB — POCT INR: POC INR: 2.9

## 2024-06-26 NOTE — Patient Instructions (Signed)
 Description   Hold warfarin today then continue taking warfarin 1 tablet daily.  Eat 1-2 helpings of greens per week. Recheck INR in 2 weeks. Coumadin  Clinic [phone]

## 2024-06-26 NOTE — Progress Notes (Signed)
 INR 2.9; Please see anticoagulation encounter Lab Results  Component Value Date   INR 2.9 06/26/2024   INR 1.4 (A) 06/12/2024   INR 2.7 05/13/2024    Description   Hold warfarin today then continue taking warfarin 1 tablet daily.  Eat 1-2 helpings of greens per week. Recheck INR in 2 weeks. Coumadin  Clinic [phone]

## 2024-07-01 ENCOUNTER — Ambulatory Visit: Attending: Internal Medicine

## 2024-07-01 DIAGNOSIS — Z45018 Encounter for adjustment and management of other part of cardiac pacemaker: Secondary | ICD-10-CM | POA: Diagnosis not present

## 2024-07-01 DIAGNOSIS — I5032 Chronic diastolic (congestive) heart failure: Secondary | ICD-10-CM | POA: Diagnosis not present

## 2024-07-02 ENCOUNTER — Telehealth: Payer: Self-pay

## 2024-07-02 NOTE — Progress Notes (Signed)
 EPIC Encounter for ICM Monitoring  Patient Name: Danielle Mckinney is a 73 y.o. female Date: 07/02/2024 Primary Care Physican: Danielle Lamarr GORMAN, MD Primary Cardiologist: Danielle Mckinney Electrophysiologist: Danielle Mckinney Pacing:  >99%  05/31/2023 Office Weight: 190 lbs 12/14/2023 Weight: 188-190 lbs 02/27/2024 Office Weight: 179 lbs   AT/AF Burden: 0%    Attempted call to patient and unable to reach.  Left detailed message per DPR regarding transmission.  Transmission results reviewed.    Corvue Thoracic impedance suggesting normal fluid levels with the exception of possible fluid accumulation from 8/27-9/1.   Prescribed:  Furosemide  40 mg Take 40 mg by mouth daily as needed for edema.  Takes a few days a month.  Potassium 20 mEq Take 20 mEq by mouth daily as needed (when taking furosemide ).  Spironolactone  25 mg take 1 tablet daily   Labs: 07/18/2022 Creatinine 0.81, BUN 16, Potassium 4.1, Sodium 139, GFR 79 A complete set of results can be found in Results Review.   Recommendations:   Left voice mail with ICM number and encouraged to call if experiencing any fluid symptoms.   Follow-up plan: ICM clinic phone appointment on 08/11/2024.   91 day device clinic remote transmission 09/09/2024.     EP/Cardiology Office Visits:   Recall 09/02/2024 with Dr Danielle.   07/31/2024 with Dr Danielle Mckinney.     Copy of ICM check sent to Dr. Waddell.    3 month ICM trend: 07/01/2024.    12-14 Month ICM trend:     Danielle Mckinney Garner, RN 07/02/2024 4:44 PM

## 2024-07-02 NOTE — Telephone Encounter (Signed)
 Remote ICM transmission received.  Attempted call to patient regarding ICM remote transmission and left detailed message per DPR.  Left ICM phone number and advised to return call for any fluid symptoms or questions. Next ICM remote transmission scheduled 08/11/2024.

## 2024-07-14 ENCOUNTER — Ambulatory Visit: Attending: Cardiovascular Disease | Admitting: *Deleted

## 2024-07-14 DIAGNOSIS — I480 Paroxysmal atrial fibrillation: Secondary | ICD-10-CM

## 2024-07-14 DIAGNOSIS — I639 Cerebral infarction, unspecified: Secondary | ICD-10-CM

## 2024-07-14 DIAGNOSIS — Z7901 Long term (current) use of anticoagulants: Secondary | ICD-10-CM

## 2024-07-14 LAB — POCT INR: INR: 1.3 — AB (ref 2.0–3.0)

## 2024-07-14 NOTE — Patient Instructions (Signed)
 Description   INR-1.3; Today take 1.5 tablets of warfarin then continue taking warfarin 1 tablet daily. Eat 1-2 helpings of greens per week. Recheck INR in 2 weeks. Coumadin  Clinic [phone]

## 2024-07-14 NOTE — Progress Notes (Signed)
 Description   INR-1.3; Today take 1.5 tablets of warfarin then continue taking warfarin 1 tablet daily. Eat 1-2 helpings of greens per week. Recheck INR in 2 weeks. Coumadin  Clinic [phone]

## 2024-07-24 NOTE — Progress Notes (Signed)
Remote ICD Transmission.

## 2024-07-31 ENCOUNTER — Ambulatory Visit: Attending: Cardiovascular Disease | Admitting: Cardiovascular Disease

## 2024-07-31 ENCOUNTER — Encounter: Payer: Self-pay | Admitting: Cardiovascular Disease

## 2024-07-31 ENCOUNTER — Ambulatory Visit

## 2024-07-31 ENCOUNTER — Other Ambulatory Visit (HOSPITAL_COMMUNITY): Payer: Self-pay

## 2024-07-31 ENCOUNTER — Telehealth: Payer: Self-pay | Admitting: *Deleted

## 2024-07-31 VITALS — BP 126/60 | HR 63 | Ht 62.0 in | Wt 182.2 lb

## 2024-07-31 DIAGNOSIS — Z45018 Encounter for adjustment and management of other part of cardiac pacemaker: Secondary | ICD-10-CM

## 2024-07-31 DIAGNOSIS — Z8673 Personal history of transient ischemic attack (TIA), and cerebral infarction without residual deficits: Secondary | ICD-10-CM

## 2024-07-31 DIAGNOSIS — I5032 Chronic diastolic (congestive) heart failure: Secondary | ICD-10-CM | POA: Diagnosis not present

## 2024-07-31 DIAGNOSIS — I480 Paroxysmal atrial fibrillation: Secondary | ICD-10-CM | POA: Diagnosis not present

## 2024-07-31 DIAGNOSIS — Z7901 Long term (current) use of anticoagulants: Secondary | ICD-10-CM

## 2024-07-31 DIAGNOSIS — E669 Obesity, unspecified: Secondary | ICD-10-CM

## 2024-07-31 DIAGNOSIS — I10 Essential (primary) hypertension: Secondary | ICD-10-CM

## 2024-07-31 DIAGNOSIS — I639 Cerebral infarction, unspecified: Secondary | ICD-10-CM

## 2024-07-31 DIAGNOSIS — R7303 Prediabetes: Secondary | ICD-10-CM

## 2024-07-31 DIAGNOSIS — D6869 Other thrombophilia: Secondary | ICD-10-CM

## 2024-07-31 LAB — POCT INR: POC INR: 1.3

## 2024-07-31 MED ORDER — APIXABAN 5 MG PO TABS
5.0000 mg | ORAL_TABLET | Freq: Two times a day (BID) | ORAL | 12 refills | Status: DC
Start: 2024-07-31 — End: 2024-08-18
  Filled 2024-07-31: qty 60, 30d supply, fill #0

## 2024-07-31 NOTE — Patient Instructions (Addendum)
 Medication Instructions:   STOP TAKING  COUMADIN  THE DAY YOU START ELIQUIS  ELIQUIS 5 MG  TWICE A DAY   *If you need a refill on your cardiac medications before your next appointment, please call your pharmacy*   Lab Work: NOT NEEDED    Testing/Procedures:  NOT NEEDED  Follow-Up: At [facility], you and your health needs are our priority.  As part of our continuing mission to provide you with exceptional heart care, we have created designated Provider Care Teams.  These Care Teams include your primary Cardiologist (physician) and Advanced Practice Providers (APPs -  Physician Assistants and Nurse Practitioners) who all work together to provide you with the care you need, when you need it.     Your next appointment:   12 month(s)  The format for your next appointment:   In Person  Provider:   Jerel Balding, MD

## 2024-07-31 NOTE — Progress Notes (Signed)
 Cardiology Office Note    Date:  07/31/2024   ID:  Danielle Mckinney, DOB 1950/11/15, MRN [id number]  PCP:  Chrystal Lamarr GORMAN, MD  Cardiologist:  Danelle Birmingham, M.D.; Jerel Balding, MD   No chief complaint on file.    History of Present Illness:  Danielle Mckinney is a 73 y.o. 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  She continues to enjoy line dancing.  The patient specifically denies any chest pain at rest or with exertion, dyspnea at rest or with exertion, orthopnea, paroxysmal nocturnal dyspnea, syncope, palpitations, focal neurological deficits, intermittent claudication, lower extremity edema, unexplained weight gain, cough, hemoptysis or wheezing.  She has not had any falls or bleeding problems.  Unfortunately over the last several weeks her prothrombin times have been very volatile and she has been alternating between excessive anticoagulation and insufficient anticoagulation.  Part of it may be difficulty maintaining a stable diet.  She has recently been eating more salads.  She very rarely forgets to take her Coumadin .  Device interrogation in the office today shows normal function.  Estimated generator longevity is just over 3 years.  She only requires 5% atrial pacing and she has better than 99% biventricular pacing.  She has not had any episodes of high ventricular rates or atrial fibrillation.  All the lead parameters look great.  Thoracic impedance (Corvue) has been occasionally out of range, but quickly reverted to normal.  She has not had clinically overt heart failure.  Her RV lead is a Riata ST 7001 implanted in 2008 that is still functioning normally has excellent high-voltage and pace-sense  impedances, but which had some problems with noise oversensing so that ventricular sensing is programmed via the LV lead.  All lead parameters were stable and in good range today.   She has good metabolic control.  Her most recent hemoglobin A1c was 6.3% and most recent LDL was 62.  She has always had a pretty good HDL level, just slightly lower than last checked at 46.  She has normal renal function.  Most recent creatinine was 0.8.   Past Medical History:  Diagnosis Date   Atrial fibrillation [facility])    Automatic implantable cardioverter-defibrillator in situ 08/25/2009   Qualifier: Diagnosis of  By: Birmingham, MD, [facility], Danelle Fallow     Biventricular ICD (implantable cardioverter-defibrillator) in place    St.Jude   Breast cancer [facility])    1989, 1998   Cancer (HCC)    Breast cancer-BRACA I gene   CHF 08/18/2009   Qualifier: Diagnosis of  By: Gardenia Pao     CHF (congestive heart failure) (HCC)    Chronic diastolic CHF (congestive heart failure) (HCC) 04/23/2012   CIN I (cervical intraepithelial neoplasia I)    CVA (cerebral vascular accident) (HCC) 01/15/2013   Essential hypertension 08/18/2009   Qualifier: Diagnosis of  By: Gardenia Pao     Fibroid    Fibroid uterus 04/15/2013   Genetic testing 07/06/2015   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  The report date is January 21, 1998.    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   Stroke (  [facility])     Past Surgical History:  Procedure Laterality Date   BIV ICD GENERTAOR CHANGE OUT  12/20/2011   [employer]Jude   BIV ICD GENERTAOR CHANGE OUT N/A 12/20/2011   Procedure: BIV ICD GENERTAOR CHANGE OUT;  Surgeon: Danelle LELON Birmingham, MD;  Location: [facility] CATH LAB;  Service: Cardiovascular;  Laterality: N/A;   BIV PACEMAKER GENERATOR CHANGEOUT N/A 12/16/2019    Procedure: DOWNGRADE TO BIV PACEMAKER;  Surgeon: Birmingham Danelle LELON, MD;  Location: [facility] INVASIVE CV LAB;  Service: Cardiovascular;  Laterality: N/A;   BREAST SURGERY     Bilateral mastctomy and reconstruction TRAM   CARDIAC DEFIBRILLATOR PLACEMENT     CERVICAL CONE BIOPSY     COLPOSCOPY     GYNECOLOGIC CRYOSURGERY     MASTECTOMY     OOPHORECTOMY     BSO   TRANSTHORACIC ECHOCARDIOGRAM  08/14/2006   TUBAL LIGATION     US  ECHOCARDIOGRAPHY  06/25/2012   borderline concentric LVH,LV systolic fx mildly reduced,impaired LV relaxation    Current Medications: Outpatient Medications Prior to Visit  Medication Sig Dispense Refill   Calcium-Magnesium-Vitamin D (CALCIUM 1200+D3 PO) Take 1 tablet by mouth daily.      carvedilol  (COREG ) 6.25 MG tablet Take 1.5 tablets (9.375 mg total) by mouth 2 (two) times daily with a meal. 270 tablet 0   Cholecalciferol 25 MCG (1000 UT) tablet Take 1,000 Units by mouth daily.     Flaxseed, Linseed, (FLAX SEED OIL) 1000 MG CAPS Take 1 capsule by mouth daily at 6 (six) AM.     furosemide  (LASIX ) 40 MG tablet TAKE 1 TABLET BY MOUTH EVERY DAY AS NEEDED FOR SWELLING 90 tablet 1   losartan  (COZAAR ) 100 MG tablet Take 0.5 tablets (50 mg total) by mouth 2 (two) times daily. 90 tablet 0   Polyethyl Glycol-Propyl Glycol (SYSTANE OP) Place 1 drop into both eyes 2 (two) times daily.     potassium chloride  SA (KLOR-CON ) 20 MEQ tablet TAKE 1 TABLET BY MOUTH DAILY IF NEEDED(TAKE ALONG WITH FUROSEMIDE  WHEN NEEDED) 30 tablet 3   rosuvastatin (CRESTOR) 10 MG tablet Take 10 mg by mouth daily.     spironolactone  (ALDACTONE ) 25 MG tablet Take 1 tablet (25 mg total) by mouth daily. TAKE 1 TABLET(25 MG) BY MOUTH DAILY 90 tablet 3   valACYclovir (VALTREX) 500 MG tablet Take 500 mg by mouth as needed.     warfarin (COUMADIN ) 4 MG tablet TAKE 1 TO 1 AND 1/2 TABLETS BY MOUTH DAILY AS DIRECTED BY COUMADIN  CLINIC 100 tablet 1   meclizine (ANTIVERT) 25 MG tablet Take 25 mg by mouth 3 (three) times  daily as needed for dizziness. (Patient not taking: Reported on 07/31/2024)     No facility-administered medications prior to visit.     Allergies:   Codeine, Hydrocodone-acetaminophen , Lisinopril, Oxycodone, Penicillins, and Sulfa antibiotics   Social History   Socioeconomic History   Marital status: Divorced    Spouse name: Not on file   Number of children: Not on file   Years of education: Not on file   Highest education level: Not on file  Occupational History   Not on file  Tobacco Use   Smoking status: Never    Passive exposure: Never   Smokeless tobacco: Never  Vaping Use   Vaping status: Never Used  Substance and Sexual Activity   Alcohol use: No    Alcohol/week: 0.0 standard drinks of alcohol   Drug use: No   Sexual activity: Yes  Birth control/protection: Post-menopausal, Surgical    Comment: BTL, First IC <16, Partners 5, DES-neg  Other Topics Concern   Not on file  Social History Narrative   Not on file   Social Drivers of Health   Financial Resource Strain: Not on file  Food Insecurity: Not on file  Transportation Needs: Not on file  Physical Activity: Not on file  Stress: Not on file  Social Connections: Not on file     Family History:  The patient's family history includes Breast cancer in her sister; Cirrhosis in her father; Clotting disorder in her mother; Coronary artery disease in an other family member; Heart disease in her maternal grandfather; Hypertension in her brother and sister.   ROS:   Please see the history of present illness.   All other systems are reviewed and are negative.   PHYSICAL EXAM:   VS:  BP 126/60 (BP Location: Right Arm, Patient Position: Sitting, Cuff Size: Normal)   Pulse 63   Ht 5' 2 (1.575 m)   Wt 182 lb 3.2 oz (82.6 kg)   SpO2 98%   BMI 33.32 kg/m       General: Alert, oriented x3, no distress, moderately obese.  Healthy left subclavian ICD site. Head: no evidence of trauma, PERRL, EOMI, no exophtalmos or  lid lag, no myxedema, no xanthelasma; normal ears, nose and oropharynx Neck: normal jugular venous pulsations and no hepatojugular reflux; brisk carotid pulses without delay and no carotid bruits Chest: clear to auscultation, no signs of consolidation by percussion or palpation, normal fremitus, symmetrical and full respiratory excursions Cardiovascular: normal position and quality of the apical impulse, regular rhythm, normal first and second heart sounds, no murmurs, rubs or gallops Abdomen: no tenderness or distention, no masses by palpation, no abnormal pulsatility or arterial bruits, normal bowel sounds, no hepatosplenomegaly Extremities: no clubbing, cyanosis or edema; 2+ radial, ulnar and brachial pulses bilaterally; 2+ right femoral, posterior tibial and dorsalis pedis pulses; 2+ left femoral, posterior tibial and dorsalis pedis pulses; no subclavian or femoral bruits Neurological: grossly nonfocal Psych: Normal mood and affect     Wt Readings from Last 3 Encounters:  07/31/24 182 lb 3.2 oz (82.6 kg)  03/06/24 184 lb 6.4 oz (83.6 kg)  02/27/24 179 lb (81.2 kg)      Studies/Labs Reviewed:   ECHO 12/09/2019: Study Result   1. Left ventricular ejection fraction, by estimation, is 55 to 60%. The left ventricle has normal function. The left ventrical has no regional wall motion abnormalities. Left ventricular diastolic parameters are  consistent with Grade I diastolic dysfunction (impaired relaxation). Elevated left ventricular pressure.   2. Right ventricular systolic function is normal. The right ventricular size is normal. There is mildly elevated pulmonary artery systolic pressure.   3. Left atrial size was mildly dilated.   4. Trivial mitral valve regurgitation.   5. The aortic valve is normal in structure and function. Aortic valve regurgitation is not visualized. No aortic stenosis is present.   6. The inferior vena cava is normal in size with greater than 50% respiratory  variability, suggesting right atrial pressure of 3 mmHg.    EKG:    EKG Interpretation Date/Time:  Thursday July 31 2024 11:48:52 EDT Ventricular Rate:  63 PR Interval:  166 QRS Duration:  136 QT Interval:  450 QTC Calculation: 460 R Axis:   112  Text Interpretation: Atrial-sensed ventricular-paced rhythm When compared with ECG of 31-May-2023 11:31, Vent. rate has decreased BY   5 BPM Confirmed  by Francyne Headland [phone]) on 07/31/2024 12:00:32 PM         Recent Labs: No results found for requested labs within last 365 days.    ASSESSMENT:    1. Chronic diastolic CHF (congestive heart failure) (HCC)   2. Paroxysmal atrial fibrillation (HCC)   3. History of embolic stroke   4. Biventricular pacemaker check   5. Acquired thrombophilia   6. Essential hypertension   7. Prediabetes   8. Moderate obesity      PLAN:  In order of problems listed above:  CHF: With recovered LVEF, NYHA functional class I, euvolemic not requiring daily loop diuretics.  She will take furosemide  less than monthly.  Continue carvedilol , spironolactone , losartan .  We have previously discussed SGLT2 inhibitors but she prefers not to take these.  Since her heart failure so well compensated that is quite reasonable. CRT-P: Normal device function with virtually 100% biventricular pacing.  No episodes of high ventricular rates.  S/p generator change out, downgrade from ICD to CRT-P. Some chronic noise oversensing on the RV channel, now programmed to sense via LV lead.   AFib: Although she has a remote history of embolic stroke, i it has been many years since we have documented atrial fibrillation on device checks (no meaningful events have been recorded since generator change out in 2019).  Nevertheless she is at higher embolic risk.  CHADSVasc 5 (gender, hypertension, heart failure, previous stroke).   Anticoagulation: Recently we have had problems maintaining therapeutic INR range.  I recommended  transitioning to a more predictable drug such as Eliquis 5 mg twice daily. HTN: Very well-controlled on heart failure medications. PreDM: Remains in prediabetes range with a hemoglobin A1c of 6.3%.  If she does require implementation of agents for diabetes I would strongly recommend Jardiance or Farxiga since these would also offer benefit for heart failure. Obesity: Has neither gained or lost weight in the last couple of years   Medication Adjustments/Labs and Tests Ordered: Current medicines are reviewed at length with the patient today.  Concerns regarding medicines are outlined above.  Medication changes, Labs and Tests ordered today are listed in the Patient Instructions below. Patient Instructions  Medication Instructions:   STOP TAKING  COUMADIN  THE DAY YOU START ELIQUIS  ELIQUIS 5 MG  TWICE A DAY   *If you need a refill on your cardiac medications before your next appointment, please call your pharmacy*   Lab Work: NOT NEEDED    Testing/Procedures:  NOT NEEDED  Follow-Up: At [facility], you and your health needs are our priority.  As part of our continuing mission to provide you with exceptional heart care, we have created designated Provider Care Teams.  These Care Teams include your primary Cardiologist (physician) and Advanced Practice Providers (APPs -  Physician Assistants and Nurse Practitioners) who all work together to provide you with the care you need, when you need it.     Your next appointment:   12 month(s)  The format for your next appointment:   In Person  Provider:   Headland Francyne, MD      Signed, Headland Francyne, MD  07/31/2024 6:08 PM    [facility] [address] [place], [place], [place]  [address] Phone: [phone]; Fax: [phone]

## 2024-07-31 NOTE — Progress Notes (Cosign Needed Addendum)
 Updating encounter: Pt is switching to Eliquis.   Description    Addendum: Called pt and instructed her to STOP WARFARIN and to start Eliquis 10/2 tonight. Taking 1 tablet by mouth about every 12 hours.

## 2024-07-31 NOTE — Progress Notes (Signed)
 Lab Results  Component Value Date   INR 1.3 07/31/2024   INR 1.3 (A) 07/14/2024   INR 2.9 06/26/2024   Description   INR-1.3; Begin new regimen starting today (07/31/24): 1.5 tablets today. Please take 1 tablet daily except Tuesday and Thursdays take 1.5 tablets. Continue vitamin K at 1-2 helpings of greens per week. Recheck INR in 1 week. Coumadin  Clinic [phone]

## 2024-07-31 NOTE — Telephone Encounter (Signed)
 Received message from Dr. Francyne about switching pt to Eliquis because her INR has been labile. Prescription for Eliquis 5mg  BID was already sent in earlier today.   Called and spoke to pt, who verified she had not had any warfarin today and instructed her to START her Eliquis 5mg  BID tonight ( Taking 1 tablet by mouth about every 12 hours.) and to STOP her warfarin.   Informed pt that Eliquis is a blood thinner that is metabolized in the kidneys and she will not have to come in for INR checks. However, she will need to have her kidney function checked yearly.   Pt verbalized understanding and stated that she did not have any questions because Dr. JAYSON had gone over all of her questions earlier today.

## 2024-07-31 NOTE — Patient Instructions (Addendum)
 Description    Addendum: Called pt and instructed her to STOP WARFARIN and to start Eliquis 10/2 tonight. Taking 1 tablet by mouth about every 12 hours.

## 2024-08-11 ENCOUNTER — Ambulatory Visit: Attending: Internal Medicine

## 2024-08-11 ENCOUNTER — Encounter

## 2024-08-11 DIAGNOSIS — Z45018 Encounter for adjustment and management of other part of cardiac pacemaker: Secondary | ICD-10-CM | POA: Diagnosis not present

## 2024-08-11 DIAGNOSIS — I5032 Chronic diastolic (congestive) heart failure: Secondary | ICD-10-CM

## 2024-08-13 ENCOUNTER — Telehealth: Payer: Self-pay

## 2024-08-13 NOTE — Telephone Encounter (Signed)
 Remote ICM transmission received.  Attempted call to patient regarding ICM remote transmission and no answer.

## 2024-08-13 NOTE — Progress Notes (Signed)
 EPIC Encounter for ICM Monitoring  Patient Name: Danielle Mckinney is a 73 y.o. female Date: 08/13/2024 Primary Care Physican: Chrystal Lamarr GORMAN, MD Primary Cardiologist: Croitoru Electrophysiologist: Waddell Pore Pacing:  >99%  05/31/2023 Office Weight: 190 lbs 12/14/2023 Weight: 188-190 lbs 02/27/2024 Office Weight: 179 lbs   AT/AF Burden: 0%    Attempted call to patient and unable to reach.   Transmission results reviewed.    Corvue Thoracic impedance suggesting normal fluid levels with the exception of possible fluid accumulation starting 08/01/2024 and returned to baseline 08/11/2024.   Prescribed:  Furosemide  40 mg Take 40 mg by mouth daily as needed for edema.  Takes a few days a month.  Potassium 20 mEq Take 20 mEq by mouth daily as needed (when taking furosemide ).  Spironolactone  25 mg take 1 tablet daily   Labs: 07/18/2022 Creatinine 0.81, BUN 16, Potassium 4.1, Sodium 139, GFR 79 A complete set of results can be found in Results Review.   Recommendations:   Unable to reach.     Follow-up plan: ICM clinic phone appointment on 09/15/2024.   91 day device clinic remote transmission 09/09/2024.     EP/Cardiology Office Visits:   Recall 09/02/2024 with Dr Waddell.     Copy of ICM check sent to Dr. Waddell.     Remote monitoring is medically necessary for Heart Failure Management.    Daily Thoracic Impedance ICM trend: 05/10/2024 through 08/08/2024.    12-14 Month Thoracic Impedance ICM trend:     [name], RN 08/13/2024 8:41 AM

## 2024-08-18 ENCOUNTER — Telehealth: Payer: Self-pay | Admitting: Cardiovascular Disease

## 2024-08-18 MED ORDER — APIXABAN 5 MG PO TABS: 5.0000 mg | ORAL_TABLET | Freq: Two times a day (BID) | ORAL | 1 refills | Status: DC

## 2024-08-18 NOTE — Telephone Encounter (Signed)
 Prescription refill request for Eliquis received. Indication: PAF Last office visit: 07/31/24  CHRISTELLA Croitoru MD Scr: 0.81 on 07/18/22 Age: 73 Weight: 82.6kg  Based on above findings Eliquis 5mg  twice daily is the appropriate dose.  Pt is past due for lab wor.  She is due to see Dr Waddell 09/02/24.  Message sent to schedulers to make appt and get labs at that time.  Refill approved x 2.

## 2024-08-18 NOTE — Telephone Encounter (Signed)
*  STAT* If patient is at the pharmacy, call can be transferred to refill team.   1. Which medications need to be refilled? (please list name of each medication and dose if known)   apixaban (ELIQUIS) 5 MG TABS tablet     2. Would you like to learn more about the convenience, safety, & potential cost savings by using the [facility]? No   3. Are you open to using the [facility] ([facility] ) No   4. Which pharmacy/location (including street and city if local pharmacy) is medication to be sent to?  [id number] - [place], [place] - [address]   5. Do they need a 30 day or 90 day supply? 30 day

## 2024-09-09 ENCOUNTER — Ambulatory Visit: Payer: Medicare Other

## 2024-09-09 DIAGNOSIS — I428 Other cardiomyopathies: Secondary | ICD-10-CM | POA: Diagnosis not present

## 2024-09-10 ENCOUNTER — Telehealth: Payer: Self-pay | Admitting: Cardiovascular Disease

## 2024-09-10 DIAGNOSIS — I5032 Chronic diastolic (congestive) heart failure: Secondary | ICD-10-CM

## 2024-09-10 LAB — CUP PACEART REMOTE DEVICE CHECK
Battery Remaining Longevity: 36 mo
Battery Remaining Percentage: 38 %
Battery Voltage: 2.96 V
Brady Statistic AP VP Percent: 6.1 %
Brady Statistic AP VS Percent: 1 %
Brady Statistic AS VP Percent: 93 %
Brady Statistic AS VS Percent: 1 %
Brady Statistic RA Percent Paced: 5.5 %
Date Time Interrogation Session: 20251111020014
Implantable Lead Connection Status: 753985
Implantable Lead Connection Status: 753985
Implantable Lead Connection Status: 753985
Implantable Lead Implant Date: 20080307
Implantable Lead Implant Date: 20080307
Implantable Lead Implant Date: 20080307
Implantable Lead Location: 753858
Implantable Lead Location: 753859
Implantable Lead Location: 753860
Implantable Lead Model: 7001
Implantable Pulse Generator Implant Date: 20210216
Lead Channel Impedance Value: 340 Ohm
Lead Channel Impedance Value: 630 Ohm
Lead Channel Impedance Value: 750 Ohm
Lead Channel Pacing Threshold Amplitude: 0.5 V
Lead Channel Pacing Threshold Amplitude: 0.5 V
Lead Channel Pacing Threshold Amplitude: 0.875 V
Lead Channel Pacing Threshold Pulse Width: 0.5 ms
Lead Channel Pacing Threshold Pulse Width: 0.5 ms
Lead Channel Pacing Threshold Pulse Width: 0.8 ms
Lead Channel Sensing Intrinsic Amplitude: 12 mV
Lead Channel Sensing Intrinsic Amplitude: 5 mV
Lead Channel Setting Pacing Amplitude: 1.5 V
Lead Channel Setting Pacing Amplitude: 1.75 V
Lead Channel Setting Pacing Amplitude: 2 V
Lead Channel Setting Pacing Pulse Width: 0.5 ms
Lead Channel Setting Pacing Pulse Width: 0.8 ms
Lead Channel Setting Sensing Sensitivity: 2 mV
Pulse Gen Model: 3222
Pulse Gen Serial Number: 9105249

## 2024-09-10 NOTE — Telephone Encounter (Signed)
*  STAT* If patient is at the pharmacy, call can be transferred to refill team.   1. Which medications need to be refilled? (please list name of each medication and dose if known)   furosemide  (LASIX ) 40 MG tablet  potassium chloride  SA (KLOR-CON ) 20 MEQ tablet    2. Would you like to learn more about the convenience, safety, & potential cost savings by using the [facility]?No    3. Are you open to using the [facility] ([facility] No   4. Which pharmacy/location (including street and city if local pharmacy) is medication to be sent to? [facility] #[id number] - [place], [place] - [address]     5. Do they need a 30 day or 90 day supply? 30 day   Pt is out of medications.

## 2024-09-11 MED ORDER — FUROSEMIDE 40 MG PO TABS: ORAL_TABLET | ORAL | 1 refills | Status: AC

## 2024-09-11 MED ORDER — POTASSIUM CHLORIDE CRYS ER 20 MEQ PO TBCR: EXTENDED_RELEASE_TABLET | ORAL | 1 refills | Status: AC

## 2024-09-11 NOTE — Telephone Encounter (Signed)
 Refills sent in

## 2024-09-12 NOTE — Progress Notes (Signed)
 Remote ICD Transmission

## 2024-09-13 ENCOUNTER — Other Ambulatory Visit: Payer: Self-pay | Admitting: Cardiovascular Disease

## 2024-09-14 ENCOUNTER — Ambulatory Visit: Payer: Self-pay | Admitting: Internal Medicine

## 2024-09-15 ENCOUNTER — Ambulatory Visit: Attending: Internal Medicine

## 2024-09-15 DIAGNOSIS — Z45018 Encounter for adjustment and management of other part of cardiac pacemaker: Secondary | ICD-10-CM

## 2024-09-15 DIAGNOSIS — I5032 Chronic diastolic (congestive) heart failure: Secondary | ICD-10-CM

## 2024-09-15 NOTE — Progress Notes (Signed)
 EPIC Encounter for ICM Monitoring  Patient Name: Danielle Mckinney is a 73 y.o. female Date: 09/15/2024 Primary Care Physican: Chrystal Lamarr GORMAN, MD Primary Cardiologist: Croitoru Electrophysiologist: Waddell Pore Pacing:  >99%  05/31/2023 Office Weight: 190 lbs 12/14/2023 Weight: 188-190 lbs 02/27/2024 Office Weight: 179 lbs 09/15/2024 Weight: 178-180 lbs   AT/AF Burden: <1%    Spoke with patient and heart failure questions reviewed.  Transmission results reviewed.  Pt asymptomatic for fluid accumulation.  She had cataract surgery today and doing fine.    Since 08/11/2024 ICM Remote Transmission: Corvue Thoracic impedance suggesting normal fluid levels with the exception of possible fluid accumulation from 08/23/2024-08/29/2024.   Prescribed:  Furosemide  40 mg Take 40 mg by mouth daily as needed for edema.  Takes a few days a month.  Potassium 20 mEq Take 20 mEq by mouth daily as needed (when taking furosemide ).  Spironolactone  25 mg take 1 tablet daily   Labs: 07/18/2022 Creatinine 0.81, BUN 16, Potassium 4.1, Sodium 139, GFR 79 A complete set of results can be found in Results Review.   Recommendations:   No changes and encouraged to call if experiencing any fluid symptoms.   Follow-up plan: ICM clinic phone appointment on 10/27/2024.   91 day device clinic remote transmission 12/09/2024.     EP/Cardiology Office Visits:   Recall 09/02/2024 with Dr Waddell.     Copy of ICM check sent to Dr. Waddell.      Remote monitoring is medically necessary for Heart Failure Management.    Daily Thoracic Impedance ICM trend: 06/17/2024 through 09/15/2024.    12-14 Month Thoracic Impedance ICM trend:     [name], RN 09/15/2024 11:20 AM

## 2024-09-23 ENCOUNTER — Other Ambulatory Visit: Payer: Self-pay | Admitting: Cardiovascular Disease

## 2024-09-29 ENCOUNTER — Other Ambulatory Visit: Payer: Self-pay | Admitting: Cardiovascular Disease

## 2024-09-29 ENCOUNTER — Telehealth: Payer: Self-pay | Admitting: Cardiovascular Disease

## 2024-09-29 NOTE — Telephone Encounter (Signed)
*  STAT* If patient is at the pharmacy, call can be transferred to refill team.   1. Which medications need to be refilled? (please list name of each medication and dose if known) carvedilol  (COREG ) 6.25 MG tablet    4. Which pharmacy/location (including street and city if local pharmacy) is medication to be sent to?  [facility] #[id number] - [place], [place] - [address]     5. Do they need a 30 day or 90 day supply? 90    Pt states she is completely out

## 2024-09-30 NOTE — Telephone Encounter (Signed)
 Refill has already been sent

## 2024-10-27 ENCOUNTER — Telehealth: Payer: Self-pay

## 2024-10-27 ENCOUNTER — Ambulatory Visit: Attending: Internal Medicine

## 2024-10-27 DIAGNOSIS — Z45018 Encounter for adjustment and management of other part of cardiac pacemaker: Secondary | ICD-10-CM | POA: Diagnosis not present

## 2024-10-27 DIAGNOSIS — I5032 Chronic diastolic (congestive) heart failure: Secondary | ICD-10-CM

## 2024-10-27 NOTE — Progress Notes (Signed)
 EPIC Encounter for ICM Monitoring  Patient Name: Danielle Mckinney is a 73 y.o. female Date: 10/27/2024 Primary Care Physican: Chrystal Lamarr GORMAN, MD Primary Cardiologist: Croitoru Electrophysiologist: Waddell Pore Pacing:  >99%  05/31/2023 Office Weight: 190 lbs 12/14/2023 Weight: 188-190 lbs 02/27/2024 Office Weight: 179 lbs 09/15/2024 Weight: 178-180 lbs   AT/AF Burden: <1%    Attempted call to patient and unable to reach.  Left detailed message per DPR regarding transmission.  Transmission results reviewed.     Since 111/17/2025 ICM Remote Transmission: Corvue Thoracic impedance suggesting possible fluid accumulation starting 10/20/2024.  Also suggesting fluid from 10/03/2024-10/09/2024 and 10/12/2024-10/16/2024.   Prescribed:  Furosemide  40 mg Take 40 mg by mouth daily as needed for edema.  Takes a few days a month.  Potassium 20 mEq Take 20 mEq by mouth daily as needed (when taking furosemide ).  Spironolactone  25 mg take 1 tablet daily   Labs: 07/18/2022 Creatinine 0.81, BUN 16, Potassium 4.1, Sodium 139, GFR 79 A complete set of results can be found in Results Review.   Recommendations:   Left voice mail with ICM number and encouraged to call if experiencing any fluid symptoms.   Follow-up plan: ICM clinic phone appointment on 11/03/2024 to recheck fluid levels.  Next 31 day scheduled 12/01/2024.   91 day device clinic remote transmission 12/09/2024.     EP/Cardiology Office Visits:   Recall 09/02/2024 with Dr Waddell.   Will copy Dr Francyne for review if patient is reached.    Copy of ICM check sent to Dr. Waddell.       Remote monitoring is medically necessary for Heart Failure Management.    Daily Thoracic Impedance ICM trend: 07/29/2024 through 10/27/2024.    12-14 Month Thoracic Impedance ICM trend:     [name], RN 10/27/2024 8:11 AM

## 2024-10-27 NOTE — Telephone Encounter (Signed)
 Remote ICM transmission received.  Attempted call to patient regarding ICM remote transmission.  Left detailed message per DPR with ICM phone number to return call for any questions, concerns or fluid symptoms.

## 2024-10-28 ENCOUNTER — Other Ambulatory Visit: Payer: Self-pay | Admitting: Cardiovascular Disease

## 2024-10-28 NOTE — Telephone Encounter (Signed)
 Prescription refill request for Eliquis  received. Indication:afib Last office visit:10/25 Scr: needs labs Age:73 Weight:82.6  kg  Prescription refilled

## 2024-11-04 ENCOUNTER — Ambulatory Visit

## 2024-11-04 DIAGNOSIS — Z45018 Encounter for adjustment and management of other part of cardiac pacemaker: Secondary | ICD-10-CM

## 2024-11-04 DIAGNOSIS — I5032 Chronic diastolic (congestive) heart failure: Secondary | ICD-10-CM

## 2024-11-04 NOTE — Progress Notes (Cosign Needed)
 EPIC Encounter for ICM Monitoring  Patient Name: Danielle Mckinney is a 74 y.o. female Date: 11/04/2024 Primary Care Physican: Chrystal Lamarr GORMAN, MD Primary Cardiologist: Croitoru Electrophysiologist: Waddell Pore Pacing:  >99%  05/31/2023 Office Weight: 190 lbs 12/14/2023 Weight: 188-190 lbs 02/27/2024 Office Weight: 179 lbs 09/15/2024 Weight: 178-180 lbs 11/04/2024 Weight: 178-180 lbs   AT/AF Burden: <1%    Spoke with patient and heart failure questions reviewed.  Transmission results reviewed.  Pt asymptomatic for fluid accumulation.  She did take Furosemide  + Potassium x 4 days which would correlate with possible impedance showing dryness    Since 10/27/2024 ICM Remote Transmission: [facility] Thoracic impedance suggesting fluid levels returned to normal 10/28/2024.  Suggesting dryness starting 10/30/2024.   Prescribed:  Furosemide  40 mg Take 40 mg by mouth daily as needed for edema.  Takes a few days a month.  Potassium 20 mEq Take 20 mEq by mouth daily as needed (when taking furosemide ).  Spironolactone  25 mg take 1 tablet daily   Labs: 07/18/2022 Creatinine 0.81, BUN 16, Potassium 4.1, Sodium 139, GFR 79 A complete set of results can be found in Results Review.   Recommendations:   Advised to drink 64 oz fluid daily to help keep fluid levels balanced.  She denied any dehydration symptoms.     Follow-up plan: ICM clinic phone appointment on 12/01/2024.   91 day device clinic remote transmission 12/09/2024.     EP/Cardiology Office Visits:   Recall 09/02/2024 with Dr Waddell.      Copy of ICM check sent to Dr. Waddell.       Remote monitoring is medically necessary for Heart Failure Management.    Daily Thoracic Impedance ICM trend: 08/06/2024 through 11/04/2024.    12-14 Month Thoracic Impedance ICM trend:     [name], RN 11/04/2024 12:15 PM

## 2024-11-05 NOTE — Addendum Note (Signed)
 Addended by: ARMANDA MITZIE RAMAN on: 11/05/2024 10:09 AM   Modules accepted: Level of Service

## 2024-11-13 ENCOUNTER — Other Ambulatory Visit: Payer: Self-pay

## 2024-11-14 MED ORDER — SPIRONOLACTONE 25 MG PO TABS: 25.0000 mg | ORAL_TABLET | Freq: Every day | ORAL | 3 refills | Status: AC

## 2024-11-14 NOTE — Telephone Encounter (Signed)
 In accordance with refill protocols, please review and address the following requirements before this medication refill can be authorized:  Labs

## 2024-11-27 NOTE — Progress Notes (Signed)
 31 day ICM Remote transmission canceled due to [facility] clinic is on hold until further notice.  91 day remote monitoring will continue per protocol.

## 2024-12-01 ENCOUNTER — Ambulatory Visit

## 2024-12-09 ENCOUNTER — Ambulatory Visit: Payer: Medicare Other

## 2025-01-05 ENCOUNTER — Ambulatory Visit: Admitting: Cardiology

## 2025-03-03 ENCOUNTER — Encounter: Admitting: Obstetrics and Gynecology

## 2025-03-10 ENCOUNTER — Ambulatory Visit: Payer: Medicare Other

## 2025-06-09 ENCOUNTER — Ambulatory Visit: Payer: Medicare Other

## 2025-06-23 ENCOUNTER — Ambulatory Visit: Admitting: Dermatology
